# Patient Record
Sex: Male | Born: 1960 | Race: White | Hispanic: No | Marital: Married | Smoking: Never smoker
Health system: Southern US, Community
[De-identification: ages and names within clinical notes are randomized; demographics above are authoritative.]

## PROBLEM LIST (undated history)

## (undated) DIAGNOSIS — E039 Hypothyroidism, unspecified: Secondary | ICD-10-CM

## (undated) DIAGNOSIS — Z9889 Other specified postprocedural states: Secondary | ICD-10-CM

## (undated) DIAGNOSIS — S09309A Unspecified injury of unspecified middle and inner ear, initial encounter: Secondary | ICD-10-CM

## (undated) DIAGNOSIS — M7989 Other specified soft tissue disorders: Secondary | ICD-10-CM

## (undated) DIAGNOSIS — I10 Essential (primary) hypertension: Secondary | ICD-10-CM

## (undated) DIAGNOSIS — E669 Obesity, unspecified: Secondary | ICD-10-CM

## (undated) DIAGNOSIS — C801 Malignant (primary) neoplasm, unspecified: Secondary | ICD-10-CM

## (undated) DIAGNOSIS — Z862 Personal history of diseases of the blood and blood-forming organs and certain disorders involving the immune mechanism: Secondary | ICD-10-CM

## (undated) DIAGNOSIS — H8110 Benign paroxysmal vertigo, unspecified ear: Secondary | ICD-10-CM

## (undated) DIAGNOSIS — F329 Major depressive disorder, single episode, unspecified: Secondary | ICD-10-CM

## (undated) DIAGNOSIS — F32A Depression, unspecified: Secondary | ICD-10-CM

## (undated) DIAGNOSIS — M1990 Unspecified osteoarthritis, unspecified site: Secondary | ICD-10-CM

## (undated) DIAGNOSIS — R490 Dysphonia: Secondary | ICD-10-CM

## (undated) DIAGNOSIS — K760 Fatty (change of) liver, not elsewhere classified: Secondary | ICD-10-CM

## (undated) DIAGNOSIS — F1990 Other psychoactive substance use, unspecified, uncomplicated: Secondary | ICD-10-CM

## (undated) DIAGNOSIS — H9391 Unspecified disorder of right ear: Secondary | ICD-10-CM

## (undated) DIAGNOSIS — R112 Nausea with vomiting, unspecified: Secondary | ICD-10-CM

## (undated) DIAGNOSIS — Z8709 Personal history of other diseases of the respiratory system: Secondary | ICD-10-CM

## (undated) DIAGNOSIS — R5382 Chronic fatigue, unspecified: Secondary | ICD-10-CM

## (undated) DIAGNOSIS — M2550 Pain in unspecified joint: Secondary | ICD-10-CM

## (undated) DIAGNOSIS — M549 Dorsalgia, unspecified: Secondary | ICD-10-CM

## (undated) DIAGNOSIS — F419 Anxiety disorder, unspecified: Secondary | ICD-10-CM

## (undated) DIAGNOSIS — F1010 Alcohol abuse, uncomplicated: Secondary | ICD-10-CM

## (undated) DIAGNOSIS — J381 Polyp of vocal cord and larynx: Secondary | ICD-10-CM

## (undated) DIAGNOSIS — K219 Gastro-esophageal reflux disease without esophagitis: Secondary | ICD-10-CM

## (undated) DIAGNOSIS — B1920 Unspecified viral hepatitis C without hepatic coma: Secondary | ICD-10-CM

## (undated) DIAGNOSIS — F4310 Post-traumatic stress disorder, unspecified: Secondary | ICD-10-CM

## (undated) DIAGNOSIS — R51 Headache: Secondary | ICD-10-CM

## (undated) DIAGNOSIS — R1310 Dysphagia, unspecified: Secondary | ICD-10-CM

## (undated) DIAGNOSIS — G4733 Obstructive sleep apnea (adult) (pediatric): Secondary | ICD-10-CM

## (undated) DIAGNOSIS — J349 Unspecified disorder of nose and nasal sinuses: Secondary | ICD-10-CM

## (undated) DIAGNOSIS — R519 Headache, unspecified: Secondary | ICD-10-CM

## (undated) DIAGNOSIS — G4730 Sleep apnea, unspecified: Secondary | ICD-10-CM

## (undated) DIAGNOSIS — E785 Hyperlipidemia, unspecified: Secondary | ICD-10-CM

## (undated) HISTORY — DX: Unspecified disorder of nose and nasal sinuses: J34.9

## (undated) HISTORY — DX: Hypothyroidism, unspecified: E03.9

## (undated) HISTORY — DX: Chronic fatigue, unspecified: R53.82

## (undated) HISTORY — DX: Dorsalgia, unspecified: M54.9

## (undated) HISTORY — DX: Obstructive sleep apnea (adult) (pediatric): G47.33

## (undated) HISTORY — DX: Benign paroxysmal vertigo, unspecified ear: H81.10

## (undated) HISTORY — DX: Essential (primary) hypertension: I10

## (undated) HISTORY — PX: THROAT SURGERY: SHX803

## (undated) HISTORY — DX: Unspecified osteoarthritis, unspecified site: M19.90

## (undated) HISTORY — DX: Other specified soft tissue disorders: M79.89

## (undated) HISTORY — DX: Pain in unspecified joint: M25.50

## (undated) HISTORY — PX: OTHER SURGICAL HISTORY: SHX169

## (undated) HISTORY — DX: Fatty (change of) liver, not elsewhere classified: K76.0

## (undated) HISTORY — DX: Gastro-esophageal reflux disease without esophagitis: K21.9

## (undated) HISTORY — PX: NASAL FRACTURE SURGERY: SHX718

## (undated) HISTORY — DX: Unspecified disorder of right ear: H93.91

## (undated) HISTORY — DX: Other psychoactive substance use, unspecified, uncomplicated: F19.90

## (undated) HISTORY — DX: Dysphagia, unspecified: R13.10

## (undated) HISTORY — DX: Alcohol abuse, uncomplicated: F10.10

## (undated) HISTORY — DX: Unspecified viral hepatitis C without hepatic coma: B19.20

## (undated) HISTORY — DX: Sleep apnea, unspecified: G47.30

## (undated) HISTORY — DX: Hyperlipidemia, unspecified: E78.5

---

## 1992-12-11 DIAGNOSIS — B1920 Unspecified viral hepatitis C without hepatic coma: Secondary | ICD-10-CM

## 1992-12-11 HISTORY — DX: Unspecified viral hepatitis C without hepatic coma: B19.20

## 2001-10-26 ENCOUNTER — Emergency Department (HOSPITAL_COMMUNITY): Admission: EM | Admit: 2001-10-26 | Discharge: 2001-10-27 | Payer: Self-pay | Admitting: Emergency Medicine

## 2001-10-27 ENCOUNTER — Encounter: Payer: Self-pay | Admitting: Emergency Medicine

## 2004-06-13 ENCOUNTER — Emergency Department (HOSPITAL_COMMUNITY): Admission: EM | Admit: 2004-06-13 | Discharge: 2004-06-13 | Payer: Self-pay | Admitting: Emergency Medicine

## 2005-11-08 HISTORY — PX: TRANSTHORACIC ECHOCARDIOGRAM: SHX275

## 2008-12-11 HISTORY — PX: UPPER GASTROINTESTINAL ENDOSCOPY: SHX188

## 2009-03-12 ENCOUNTER — Encounter: Admission: RE | Admit: 2009-03-12 | Discharge: 2009-03-12 | Payer: Self-pay | Admitting: Family Medicine

## 2009-04-09 ENCOUNTER — Ambulatory Visit: Payer: Self-pay | Admitting: Pulmonary Disease

## 2009-04-09 ENCOUNTER — Ambulatory Visit: Payer: Self-pay | Admitting: Internal Medicine

## 2009-04-09 DIAGNOSIS — R0602 Shortness of breath: Secondary | ICD-10-CM | POA: Insufficient documentation

## 2009-04-09 DIAGNOSIS — G4730 Sleep apnea, unspecified: Secondary | ICD-10-CM | POA: Insufficient documentation

## 2009-04-09 DIAGNOSIS — M7710 Lateral epicondylitis, unspecified elbow: Secondary | ICD-10-CM | POA: Insufficient documentation

## 2009-04-09 DIAGNOSIS — E039 Hypothyroidism, unspecified: Secondary | ICD-10-CM | POA: Insufficient documentation

## 2009-04-09 DIAGNOSIS — E785 Hyperlipidemia, unspecified: Secondary | ICD-10-CM | POA: Insufficient documentation

## 2009-04-09 DIAGNOSIS — K219 Gastro-esophageal reflux disease without esophagitis: Secondary | ICD-10-CM | POA: Insufficient documentation

## 2009-04-09 DIAGNOSIS — I10 Essential (primary) hypertension: Secondary | ICD-10-CM | POA: Insufficient documentation

## 2009-04-12 ENCOUNTER — Encounter: Payer: Self-pay | Admitting: Internal Medicine

## 2009-04-22 ENCOUNTER — Telehealth (INDEPENDENT_AMBULATORY_CARE_PROVIDER_SITE_OTHER): Payer: Self-pay | Admitting: *Deleted

## 2009-05-07 ENCOUNTER — Ambulatory Visit: Payer: Self-pay | Admitting: Internal Medicine

## 2009-05-08 ENCOUNTER — Encounter (INDEPENDENT_AMBULATORY_CARE_PROVIDER_SITE_OTHER): Payer: Self-pay | Admitting: *Deleted

## 2009-05-08 LAB — CONVERTED CEMR LAB
Basophils Absolute: 0 10*3/uL (ref 0.0–0.1)
Basophils Relative: 0.1 % (ref 0.0–3.0)
Eosinophils Absolute: 0.1 10*3/uL (ref 0.0–0.7)
Eosinophils Relative: 1.3 % (ref 0.0–5.0)
H Pylori IgG: NEGATIVE
HCT: 41.3 % (ref 39.0–52.0)
Hemoglobin: 14.5 g/dL (ref 13.0–17.0)
Lymphocytes Relative: 23.9 % (ref 12.0–46.0)
Lymphs Abs: 1.6 10*3/uL (ref 0.7–4.0)
MCHC: 35.2 g/dL (ref 30.0–36.0)
MCV: 88.2 fL (ref 78.0–100.0)
Monocytes Absolute: 0.6 10*3/uL (ref 0.1–1.0)
Monocytes Relative: 8.6 % (ref 3.0–12.0)
Neutro Abs: 4.5 10*3/uL (ref 1.4–7.7)
Neutrophils Relative %: 66.1 % (ref 43.0–77.0)
Platelets: 213 10*3/uL (ref 150.0–400.0)
RBC: 4.67 M/uL (ref 4.22–5.81)
RDW: 12.4 % (ref 11.5–14.6)
WBC: 6.8 10*3/uL (ref 4.5–10.5)

## 2009-05-19 ENCOUNTER — Telehealth (INDEPENDENT_AMBULATORY_CARE_PROVIDER_SITE_OTHER): Payer: Self-pay | Admitting: *Deleted

## 2009-05-31 ENCOUNTER — Ambulatory Visit: Payer: Self-pay | Admitting: Internal Medicine

## 2009-05-31 DIAGNOSIS — R498 Other voice and resonance disorders: Secondary | ICD-10-CM | POA: Insufficient documentation

## 2009-05-31 HISTORY — DX: Other voice and resonance disorders: R49.8

## 2009-06-01 ENCOUNTER — Encounter: Payer: Self-pay | Admitting: Internal Medicine

## 2009-06-01 ENCOUNTER — Telehealth (INDEPENDENT_AMBULATORY_CARE_PROVIDER_SITE_OTHER): Payer: Self-pay

## 2009-06-01 ENCOUNTER — Ambulatory Visit: Payer: Self-pay | Admitting: Internal Medicine

## 2009-06-06 ENCOUNTER — Encounter: Payer: Self-pay | Admitting: Internal Medicine

## 2009-08-11 HISTORY — PX: LARYNGOSCOPY: SUR817

## 2009-09-02 ENCOUNTER — Encounter: Payer: Self-pay | Admitting: Internal Medicine

## 2009-09-08 ENCOUNTER — Telehealth (INDEPENDENT_AMBULATORY_CARE_PROVIDER_SITE_OTHER): Payer: Self-pay | Admitting: *Deleted

## 2009-10-07 ENCOUNTER — Telehealth (INDEPENDENT_AMBULATORY_CARE_PROVIDER_SITE_OTHER): Payer: Self-pay | Admitting: *Deleted

## 2009-10-14 ENCOUNTER — Telehealth (INDEPENDENT_AMBULATORY_CARE_PROVIDER_SITE_OTHER): Payer: Self-pay | Admitting: *Deleted

## 2010-01-03 ENCOUNTER — Ambulatory Visit: Payer: Self-pay | Admitting: Internal Medicine

## 2010-01-03 LAB — CONVERTED CEMR LAB
ALT: 34 units/L (ref 0–53)
AST: 23 units/L (ref 0–37)
Albumin: 3.9 g/dL (ref 3.5–5.2)
Alkaline Phosphatase: 62 units/L (ref 39–117)
BUN: 14 mg/dL (ref 6–23)
Basophils Absolute: 0 10*3/uL (ref 0.0–0.1)
Basophils Relative: 0.9 % (ref 0.0–3.0)
Bilirubin Urine: NEGATIVE
Bilirubin, Direct: 0.1 mg/dL (ref 0.0–0.3)
Blood in Urine, dipstick: NEGATIVE
CO2: 29 meq/L (ref 19–32)
Calcium: 8.8 mg/dL (ref 8.4–10.5)
Chloride: 104 meq/L (ref 96–112)
Cholesterol: 141 mg/dL (ref 0–200)
Creatinine, Ser: 0.9 mg/dL (ref 0.4–1.5)
Eosinophils Absolute: 0 10*3/uL (ref 0.0–0.7)
Eosinophils Relative: 0.8 % (ref 0.0–5.0)
GFR calc non Af Amer: 95.68 mL/min (ref >60)
Glucose, Bld: 96 mg/dL (ref 70–99)
Glucose, Urine, Semiquant: NEGATIVE
HCT: 41.7 % (ref 39.0–52.0)
HDL: 43.3 mg/dL (ref >39.00)
Hemoglobin: 13.9 g/dL (ref 13.0–17.0)
Hgb A1c MFr Bld: 5.3 % (ref 4.6–6.5)
Ketones, urine, test strip: NEGATIVE
LDL Cholesterol: 80 mg/dL (ref 0–99)
Lymphocytes Relative: 21.9 % (ref 12.0–46.0)
Lymphs Abs: 1.2 10*3/uL (ref 0.7–4.0)
MCHC: 33.2 g/dL (ref 30.0–36.0)
MCV: 91 fL (ref 78.0–100.0)
Monocytes Absolute: 0.4 10*3/uL (ref 0.1–1.0)
Monocytes Relative: 7.8 % (ref 3.0–12.0)
Neutro Abs: 3.9 10*3/uL (ref 1.4–7.7)
Neutrophils Relative %: 68.6 % (ref 43.0–77.0)
Nitrite: NEGATIVE
PSA: 0.37 ng/mL (ref 0.10–4.00)
Platelets: 193 10*3/uL (ref 150.0–400.0)
Potassium: 4.9 meq/L (ref 3.5–5.1)
Protein, U semiquant: NEGATIVE
RBC: 4.58 M/uL (ref 4.22–5.81)
RDW: 13 % (ref 11.5–14.6)
Sodium: 139 meq/L (ref 135–145)
Specific Gravity, Urine: <1.005
TSH: 3.32 microintl units/mL (ref 0.35–5.50)
Total Bilirubin: 0.7 mg/dL (ref 0.3–1.2)
Total CHOL/HDL Ratio: 3
Total Protein: 6.6 g/dL (ref 6.0–8.3)
Triglycerides: 87 mg/dL (ref 0.0–149.0)
Urobilinogen, UA: 0.2
VLDL: 17.4 mg/dL (ref 0.0–40.0)
Vit D, 25-Hydroxy: 37 ng/mL (ref 30–89)
WBC Urine, dipstick: NEGATIVE
WBC: 5.5 10*3/uL (ref 4.5–10.5)
pH: 6.5

## 2010-01-06 ENCOUNTER — Ambulatory Visit: Payer: Self-pay | Admitting: Internal Medicine

## 2010-01-06 DIAGNOSIS — R9431 Abnormal electrocardiogram [ECG] [EKG]: Secondary | ICD-10-CM | POA: Insufficient documentation

## 2010-01-06 DIAGNOSIS — R490 Dysphonia: Secondary | ICD-10-CM | POA: Insufficient documentation

## 2010-01-06 HISTORY — DX: Abnormal electrocardiogram (ECG) (EKG): R94.31

## 2010-01-25 ENCOUNTER — Encounter: Payer: Self-pay | Admitting: Internal Medicine

## 2010-01-26 ENCOUNTER — Encounter: Payer: Self-pay | Admitting: Internal Medicine

## 2010-02-09 ENCOUNTER — Encounter: Payer: Self-pay | Admitting: Internal Medicine

## 2010-02-09 HISTORY — PX: CARDIOVASCULAR STRESS TEST: SHX262

## 2010-02-16 ENCOUNTER — Encounter: Payer: Self-pay | Admitting: Internal Medicine

## 2010-08-02 ENCOUNTER — Ambulatory Visit: Payer: Self-pay | Admitting: Internal Medicine

## 2010-08-02 DIAGNOSIS — R5383 Other fatigue: Secondary | ICD-10-CM

## 2010-08-02 DIAGNOSIS — R5381 Other malaise: Secondary | ICD-10-CM | POA: Insufficient documentation

## 2010-08-03 LAB — CONVERTED CEMR LAB
BUN: 17 mg/dL (ref 6–23)
Basophils Absolute: 0 10*3/uL (ref 0.0–0.1)
Basophils Relative: 0.4 % (ref 0.0–3.0)
Creatinine, Ser: 0.7 mg/dL (ref 0.4–1.5)
Eosinophils Absolute: 0.1 10*3/uL (ref 0.0–0.7)
Eosinophils Relative: 1.3 % (ref 0.0–5.0)
HCT: 39.3 % (ref 39.0–52.0)
Hemoglobin: 13.5 g/dL (ref 13.0–17.0)
Lymphocytes Relative: 22.8 % (ref 12.0–46.0)
Lymphs Abs: 1.3 10*3/uL (ref 0.7–4.0)
MCHC: 34.3 g/dL (ref 30.0–36.0)
MCV: 89.2 fL (ref 78.0–100.0)
Monocytes Absolute: 0.4 10*3/uL (ref 0.1–1.0)
Monocytes Relative: 7.5 % (ref 3.0–12.0)
Neutro Abs: 3.8 10*3/uL (ref 1.4–7.7)
Neutrophils Relative %: 68 % (ref 43.0–77.0)
Platelets: 212 10*3/uL (ref 150.0–400.0)
Potassium: 4.5 meq/L (ref 3.5–5.1)
RBC: 4.41 M/uL (ref 4.22–5.81)
RDW: 13.8 % (ref 11.5–14.6)
TSH: 1.45 microintl units/mL (ref 0.35–5.50)
WBC: 5.6 10*3/uL (ref 4.5–10.5)

## 2010-10-25 ENCOUNTER — Ambulatory Visit: Payer: Self-pay | Admitting: Internal Medicine

## 2010-10-25 DIAGNOSIS — R519 Headache, unspecified: Secondary | ICD-10-CM | POA: Insufficient documentation

## 2010-10-25 DIAGNOSIS — R51 Headache: Secondary | ICD-10-CM | POA: Insufficient documentation

## 2010-10-25 DIAGNOSIS — H8110 Benign paroxysmal vertigo, unspecified ear: Secondary | ICD-10-CM | POA: Insufficient documentation

## 2010-10-26 ENCOUNTER — Encounter: Payer: Self-pay | Admitting: Internal Medicine

## 2011-01-09 ENCOUNTER — Telehealth (INDEPENDENT_AMBULATORY_CARE_PROVIDER_SITE_OTHER): Payer: Self-pay | Admitting: *Deleted

## 2011-01-10 NOTE — Consult Note (Signed)
Summary: [facility]  [facility]   Imported By: Lanelle Bal 02/17/2010 11:28:18  _____________________________________________________________________  External Attachment:    Type:   Image     Comment:   External Document

## 2011-01-10 NOTE — Assessment & Plan Note (Signed)
Summary: lost voice/bp med/kn   Vital Signs:  Patient profile:   50 year old male Weight:      284.0 pounds BMI:     39.47 Temp:     98.7 degrees F oral Pulse rate:   64 / minute Pulse (ortho):   79 / minute Resp:     15 per minute BP supine:   136 / 98 BP sitting:   130 / 84  (left arm) BP standing:   128 / 90 Cuff size:   large  Vitals Entered By: Shonna Chock CMA (October 25, 2010 8:04 AM)  Serial Vital Signs/Assessments:  Time      Position  BP       Pulse  Resp  Temp     By           Lying LA  136/98   79                    Chrae Malloy CMA           Sitting   130/92   74                    Chrae Malloy CMA           Standing  128/90   79                    Chrae Malloy CMA  CC: 1.) Lost voice again-seen specialist in the past, d/c'ed Losartan   2.) Headaches everyday x 2 months (? sinuses) , URI symptoms   Primary Care Kyley Solow:  Marga Melnick, MD  CC:  1.) Lost voice again-seen specialist in the past, d/c'ed Losartan   2.) Headaches everyday x 2 months (? sinuses) , and URI symptoms.  History of Present Illness: Headaches      This is a 50 year old man who presents with Headaches for 2 months.  The patient reports nasal congestion and sinus pressure, but denies nausea, vomiting, sweats, tearing of eyes, photophobia, and phonophobia.  The headache is described as constant and dull, but it is also intermittently throbbingassocia.  The location of the pain is  over anterior crown bilaterally.No  associated  vision loss  but vision blurred. The pain worse with exertion.  The patient denies the following high-risk features: fever, neck pain/stiffness, focal weakness, altered mental status, and rash.  The headaches  have no triggers. Prior treatment has included a NSAID & rest . Turning over in bed causes dizziness ; he describes similar symptoms standing up. No frank vertigo noted. He D/Ced Losartan in 09/11 as adverse effects included dizziness. Dizziness has continued off the  ARB. PMH of ruptured TM diving in [employer]	[place] [phone]. URI Symptoms      The patient also presents with URI symptoms last week for 2 days with green secretions .This resolved w/o Rx .His hoarseness also returned last week . He is scheduled to see Dr Viann Shove 10/26/2010 @ [facility].  The patient now  denies nasal congestion, purulent nasal discharge, sore throat, productive cough, and earache.  The patient denies fever, dyspnea, and wheezing.  The patient denies the following risk factors for Strep sinusitis: unilateral facial pain, tooth pain, and tender adenopathy.    Current Medications (verified): 1)  Prilosec 20 Mg Cpdr (Omeprazole) .[name].. 1 By Mouth Once Daily 2)  Vytorin 10-40 Mg Tabs (Ezetimibe-Simvastatin) .... Once Daily 3)  Thyroxine .... [name]175 Mg Tab Once Daily 4)  Vitamin C .... Once Daily 5)  Mens Multi Vitamin .... Once Daily 6)  Tylenol Ex St Arthritis Pain 500 Mg Tabs (Acetaminophen) .... As Needed 7)  Amlodipine Besylate 5 Mg Tabs (Amlodipine Besylate) .[name].. 1 Once Daily If Bp Averages > 135/85 On Average  Allergies: 1)  ! Benazepril Hcl (Benazepril Hcl)  Physical Exam  General:  well-nourished,in no acute distress; alert,appropriate and cooperative throughout examination Head:  Normocephalic and atraumatic without obvious abnormalities.  pattern  alopecia  Eyes:  No corneal or conjunctival inflammation noted. EOMI. Perrla. Field of  Vision grossly normal. Slight ptosis OD Ears:  External ear exam shows no significant lesions or deformities.  Otoscopic examination reveals clear canals, tympanic membranes are intact bilaterally without bulging, retraction, inflammation or discharge. Hearing is grossly normal bilaterally. Nose:  External nasal examination shows no deformity or inflammation. Nasal mucosa are pink and moist without lesions or exudates. Septum to L Mouth:  Oral mucosa and oropharynx without lesions or exudates.  Teeth in good repair. Orophrayngeal crowding. Hoarse Neck:   No deformities, masses, or tenderness noted. Lungs:  Normal respiratory effort, chest expands symmetrically. Lungs are clear to auscultation, no crackles or wheezes. Heart:  Normal rate and regular rhythm. S1 and S2 normal without gallop, murmur, click, rub or other extra sounds. Pulses:  R and L carotid pulses are full and equal bilaterally w/o bruits Extremities:  No clubbing, cyanosis, edema. Neurologic:  alert & oriented X3, cranial nerves II-XII intact, strength normal in all extremities, sensation intact to light touch, gait normal, DTRs symmetrical and normal, finger-to-nose normal, and Romberg negative.   Skin:  Intact without suspicious lesions or rashes Cervical Nodes:  No lymphadenopathy noted Axillary Nodes:  No palpable lymphadenopathy Psych:  memory intact for recent and remote, normally interactive, and good eye contact.     Impression & Recommendations:  Problem # 1:  HEADACHE (ICD-784.0)  His updated medication list for this problem includes:    Tylenol Ex St Arthritis Pain 500 Mg Tabs (Acetaminophen) .[name]... As needed    Tramadol Hcl 50 Mg Tabs (Tramadol hcl) .[name]... 1/2 -1 every 6 hrs as needed for headache  Problem # 2:  BENIGN POSITIONAL VERTIGO (ICD-386.11) No significant postural BP drop Orders: Physical Therapy Referral (PT)  Problem # 3:  DYSPHONIA, CHRONIC (ICD-784.42)  Complete Medication List: 1)  Prilosec 20 Mg Cpdr (Omeprazole) .[name].. 1 by mouth once daily 2)  Vytorin 10-40 Mg Tabs (Ezetimibe-simvastatin) .... Once daily 3)  Thyroxine  .... [name]175 mg tab once daily 4)  Vitamin C  .... Once daily 5)  Mens Multi Vitamin  .... Once daily 6)  Tylenol Ex St Arthritis Pain 500 Mg Tabs (Acetaminophen) .... As needed 7)  Amlodipine Besylate 5 Mg Tabs (Amlodipine besylate) .[name].. 1 once daily if bp averages > 135/85 on average 8)  Tramadol Hcl 50 Mg Tabs (Tramadol hcl) .... 1/2 -1 every 6 hrs as needed for headache  Patient Instructions: 1)  Keep Headache Diary as  discussed.  2)  Check your Blood Pressure regularly. If it is above: 135/85 ON AVERAGE you should  restart the Losartan. Prescriptions: TRAMADOL HCL 50 MG TABS (TRAMADOL HCL) 1/2 -1 every 6 hrs as needed for headache  #30 x 0   Entered and Authorized by:   Marga Melnick MD   Signed by:   Marga Melnick MD on 10/25/2010   Method used:   Print then Give to Patient   RxID:   [id number]    Orders Added: 1)  Est. Patient Level IV [16109] 2)  Physical Therapy Referral [PT]

## 2011-01-10 NOTE — Op Note (Signed)
Summary: Transnasal Flex Laryngoscopy/[facility]  Transnasal Flex Laryngoscopy/[facility]   Imported By: Lanelle Bal 11/07/2010 13:41:32  _____________________________________________________________________  External Attachment:    Type:   Image     Comment:   External Document

## 2011-01-10 NOTE — Letter (Signed)
Summary: [facility]  [facility]   Imported By: Lanelle Bal 03/12/2010 11:51:34  _____________________________________________________________________  External Attachment:    Type:   Image     Comment:   External Document

## 2011-01-10 NOTE — Assessment & Plan Note (Signed)
Summary: cpx//ccm   Vital Signs:  Patient profile:   50 year old male Height:      71.25 inches Weight:      288 pounds BMI:     40.03 Temp:     98.3 degrees F oral Resp:     14 per minute BP sitting:   122 / 86  (left arm) Cuff size:   large  Vitals Entered By: Shonna Chock (January 06, 2010 11:31 AM)  Comments REVIEWED MED LIST, PATIENT AGREED DOSE AND INSTRUCTION CORRECT    Primary Care Provider:  Marga Melnick, MD   History of Present Illness: Samuel Goodwin is here for a physical; he is concerned about chronic hoarseness & inability to lose weight despite exercise. Appt with Dr Allyson Sabal 01/2010 ( see EKG: NS ST-T changes)   Allergies: 1)  ! Benazepril Hcl (Benazepril Hcl)  Past History:  Past Medical History: Hepatitis C  +, S/P Interferon X 12 months , neg serology 2002, Dr Kinnie Scales Hyperlipidemia Hypertension Hypothyroidism GERD  Alcoholism, Sobriety since 1990 Sleep apnea 2007; Dysphonia 784.42; [place] during [employer]  Past Surgical History: Oral surgery; Endoscopy 2010  negative, Dr Leone Payor; Flex FO Laryngoscopy 08/2009 , Dr Jenne Pane  Family History: Father:?Tb, cirrhosis,DM; Mother: CHF,CAD,HTN; Siblings: bro MI @ 54;bro DUD, gastric bypass; M uncles & aunts CAD/MI; M aunt AAA  Social History: 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 Eyes:  Denies blurring, double vision, and vision loss-both eyes. ENT:  Complains of decreased hearing and ringing in ears; denies nasal congestion, sinus pressure, and sore throat; Intermittent muffled hearing & tinnitus. Occasional dysphagia even w/o foods or pills. CV:  Complains of shortness of breath with exertion; denies bluish discoloration of lips or  nails, chest pain or discomfort, difficulty breathing at night, difficulty breathing while lying down, leg cramps with exertion, palpitations, swelling of feet, and swelling of hands. Resp:  Complains of excessive snoring; denies cough, hypersomnolence, morning headaches, shortness of breath, sputum productive, and wheezing. GI:  Denies abdominal pain, bloody stools, dark tarry stools, and indigestion; On PPI two times a day & bland diet. GU:  Denies discharge, dysuria, hematuria, and incontinence. MS:  Complains of joint pain, low back pain, and muscle aches; denies joint redness, joint swelling, mid back pain, and thoracic pain; Nocturnal calf cramps better with banana or juice. Derm:  Denies changes in nail beds, dryness, and lesion(s). Neuro:  Denies numbness and tingling. Psych:  Denies anxiety and depression. Endo:  Denies cold intolerance, excessive hunger, excessive thirst, excessive urination, and heat intolerance. Heme:  Denies abnormal bruising and bleeding. Allergy:  Denies itching eyes and sneezing; No angioedema.  Physical Exam  General:  well-nourished,in no acute distress; alert,appropriate and cooperative throughout examination Head:  Normocephalic and atraumatic without obvious abnormalities. Pattern  alopecia  Eyes:  No corneal or conjunctival inflammation noted. Perrla. Funduscopic exam benign, without hemorrhages, exudates or papilledema.  Ears:  External ear exam shows no significant lesions or deformities.  Otoscopic examination reveals clear canals, tympanic membranes are intact bilaterally without bulging, retraction, inflammation or discharge. Hearing is grossly normal bilaterally. Nose:  External nasal examination shows no deformity or inflammation. Nasal mucosa are pink and moist without lesions or exudates. Mouth:  Oral mucosa and oropharynx without lesions or exudates.  Teeth in good repair. Crowded oropharynx; marked hoarseness Neck:  No deformities, masses, or  tenderness noted. Lungs:  Normal respiratory effort, chest expands symmetrically. Lungs are clear to auscultation, no crackles or wheezes. Heart:  Normal rate and regular rhythm. S1 and S2 normal without gallop, murmur, click, rub.S4 Abdomen:  Bowel sounds positive,abdomen soft and non-tender without masses, organomegaly or hernias noted. Rectal:  No external abnormalities noted. Normal sphincter tone. No rectal masses or tenderness. Genitalia:  Testes bilaterally descended without nodularity, tenderness or masses. No scrotal masses or lesions. No penis lesions or urethral discharge. Prostate:  Prostate gland firm and smooth, no enlargement, nodularity, tenderness, mass, asymmetry or induration. Msk:  No deformity or scoliosis noted of thoracic or lumbar spine.   Pulses:  R and L carotid,radial,dorsalis pedis and posterior tibial pulses are full and equal bilaterally Extremities:  No clubbing, cyanosis, edema, or deformity noted with normal full range of motion of all joints.   mild crepitus of knees Neurologic:  alert & oriented X3 and DTRs symmetrical and normal.   Skin:  Intact without suspicious lesions or rashes Cervical Nodes:  No lymphadenopathy noted Axillary Nodes:  No palpable lymphadenopathy Inguinal Nodes:  No significant adenopathy Psych:  memory intact for recent and remote, normally interactive, and good eye contact.     Impression & Recommendations:  Problem # 1:  ROUTINE GENERAL MEDICAL EXAM@HEALTH  CARE FACL (ICD-V70.0)  Orders: EKG w/ Interpretation (93000)  Problem # 2:  DYSPHONIA, CHRONIC (UVO-536.64)  Orders: ENT Referral (ENT)  Problem # 3:  GERD (ICD-530.81)  His updated medication list for this problem includes:    Eql Omeprazole 20 Mg Tbec (Omeprazole) .[name]... 1 two times a day pre meals x 8 weeks  Problem # 4:  HYPOTHYROIDISM (ICD-244.9)  Problem # 5:  SLEEP APNEA (ICD-780.57)  Problem # 6:  HYPERTENSION (ICD-401.9)  His updated medication list for  this problem includes:    Cozaar 100 Mg Tabs (Losartan potassium) .[name]... 1 once daily  Orders: EKG w/ Interpretation (93000)  Complete Medication List: 1)  Eql Omeprazole 20 Mg Tbec (Omeprazole) .[name].. 1 two times a day pre meals x 8 weeks 2)  Vytorin 10-40 Mg Tabs (Ezetimibe-simvastatin) .... Once daily 3)  Flobic  .... Once daily 4)  Thyroxine  .... [name]175 mg tab once daily 5)  Vitamin C  .... Once daily 6)  Mens Multi Vitamin  .... Once daily 7)  Cozaar 100 Mg Tabs (Losartan potassium) .[name].. 1 once daily 8)  Glucosamine Hcl 1000 Mg Tabs (Glucosamine hcl) .[name].. 1 by mouth once daily 9)  Tylenol Ex St Arthritis Pain 500 Mg Tabs (Acetaminophen) .... As needed  Patient Instructions: 1)  Consume LESS THAN 40 grams of sugar/ day from LABLED foods & drinks with High Fructose Corn Syrup as #1,2 or 33 on label. Goal = waist < 40 inches. Vitamin D3 1000 International Units once daily  Prescriptions: COZAAR 100 MG TABS (LOSARTAN POTASSIUM) 1 once daily  #90 x 3   Entered and Authorized by:   Marga Melnick MD   Signed by:   Marga Melnick MD on 01/06/2010   Method used:   Faxed to ...       [facility]* (retail)       [address] [place], [place]  [address]       Ph: [phone] or [phone]  Fax: [phone]   RxID:   [id number] THYROXINE .175 mg tab once daily  #90 x 3   Entered and Authorized by:   Marga Melnick MD   Signed by:   Marga Melnick MD on 01/06/2010   Method used:   Faxed to ...       [facility]* (retail)       [address] [place], [place]  [address]       Ph: [phone] or [phone]       Fax: [phone]   RxID:   [id number] VYTORIN 10-40 MG TABS (EZETIMIBE-SIMVASTATIN) once daily  #90 x 3   Entered and Authorized by:   Marga Melnick MD   Signed by:   Marga Melnick MD on 01/06/2010   Method used:   Faxed to ...        [facility]* (retail)       [address] [place], [place]  [address]       Ph: [phone] or [phone]       Fax: [phone]   RxID:   [id number]   Appended Document: cpx//ccm Call cell [phone] for ENT appt

## 2011-01-10 NOTE — Assessment & Plan Note (Signed)
Summary: VOICE IRRITATED/CBS   Vital Signs:  Patient profile:   50 year old male Weight:      287 pounds Temp:     98.4 degrees F oral Pulse rate:   72 / minute Resp:     17 per minute BP sitting:   134 / 90  (left arm) Cuff size:   large  Vitals Entered By: Shonna Chock CMA (August 02, 2010 8:08 AM) CC: 1.) Refill Omeprazole- acid reflux causing voice irritation, Fatigue   Primary Care Provider:  Marga Melnick, MD  CC:  1.) Refill Omeprazole- acid reflux causing voice irritation and Fatigue.  History of Present Illness: Fatigue      This is a 50 year old man who presents with Fatigue for several weeks.  The patient reports primarily motivational fatigue; "a lot going on". Fatigue  is intermittent not  persistent fatigue and fatigue is present even w/o without exercise.  The patient also reports  occasional night sweats.  The patient denies fever, weight loss(weight up 10# over recent few weeks), exertional chest pain, dyspnea, cough, and hemoptysis.  Other symptoms include severe snoring;  PMH of apnea as per Dr Allyson Sabal  but CPAP never received due to cost & possible impact on driving privileges.  The patient denies the following symptoms: leg swelling, orthopnea, PND, melena, adenopathy, daytime sleepiness, and skin changes.  He questions  feeling depressed.  The patient denies altered appetite and poor sleep . His sleep disturbed by [place] schedule in [place] , working up to 14-15 hrs/day. Hoarseness evaluated   by Dr Delford Field ,Pollyann Savoy & biopsy of VC lesion completed   02/16/2010(note reviewed). Hoarseness has returned since he ran out of his PPI several weeks ago. Prilosec OTC helped.  Current Medications (verified): 1)  Eql Omeprazole 20 Mg Tbec (Omeprazole) .[name].. 1 Two Times A Day Pre Meals X 8 Weeks 2)  Vytorin 10-40 Mg Tabs (Ezetimibe-Simvastatin) .... Once Daily 3)  Thyroxine .... [name]175 Mg Tab Once Daily 4)  Vitamin C .... Once Daily 5)  Mens Multi Vitamin .... Once Daily 6)  Cozaar 100 Mg  Tabs (Losartan Potassium) .[name].. 1 Once Daily 7)  Tylenol Ex St Arthritis Pain 500 Mg Tabs (Acetaminophen) .... As Needed  Allergies: 1)  ! Benazepril Hcl (Benazepril Hcl)  Review of Systems General:  Denies chills. Eyes:  Denies blurring, double vision, and vision loss-both eyes. ENT:  Denies difficulty swallowing. CV:  Denies palpitations. Resp:  Denies morning headaches. GI:  Denies constipation and diarrhea. Derm:  Denies changes in nail beds and dryness. Neuro:  Denies numbness and tingling. Psych:  Complains of anxiety, easily angered, easily tearful, and irritability; He does not feel significant depression is present; meds declined. Some family stresses present. Endo:  Complains of heat intolerance; denies cold intolerance.  Physical Exam  General:  in no acute distress; alert,appropriate and cooperative throughout examination Eyes:  No corneal or conjunctival inflammation noted. No lid lag.Perrla.No icterus Mouth:  Oral mucosa and oropharynx without lesions or exudates but oropharynx crowded.  Teeth in good repair.Very hoarse Neck:  No deformities, masses, or tenderness noted. Lungs:  Normal respiratory effort, chest expands symmetrically. Lungs are clear to auscultation, no crackles or wheezes. Heart:  normal rate, regular rhythm, no murmur, no gallop, no rub, no JVD, and no HJR.  S4 Abdomen:  Bowel sounds positive,abdomen soft and non-tender without masses, organomegaly or hernias noted. Pulses:  R and L carotid,radial,dorsalis pedis and posterior tibial pulses are full and equal bilaterally Extremities:  No clubbing, cyanosis, edema. Neurologic:  alert & oriented X3 and DTRs symmetrical and normal.   Skin:  Intact without suspicious lesions or rashes Cervical Nodes:  No lymphadenopathy noted Axillary Nodes:  No palpable lymphadenopathy Psych:  memory intact for recent and remote, normally interactive, and good eye contact.     Impression & Recommendations:  Problem #  1:  FATIGUE (ICD-780.79)  Multifactorial; R/O progressive Sleep Apnea  Orders: Venipuncture (16109) TLB-CBC Platelet - w/Differential (85025-CBCD) TLB-TSH (Thyroid Stimulating Hormone) (84443-TSH)  Problem # 2:  HOARSENESS, CHRONIC (ICD-784.49) Due to #3 & VC issues  Problem # 3:  GERD (ICD-530.81)  Orders: Venipuncture (60454) TLB-CBC Platelet - w/Differential (85025-CBCD)  Problem # 4:  SLEEP APNEA (ICD-780.57) Updated assessment needed; risk of uncontrolled Apnea discussed  Problem # 5:  HYPERTENSION (ICD-401.9)  His updated medication list for this problem includes:    Cozaar 100 Mg Tabs (Losartan potassium) .[name]... 1 once daily    Amlodipine Besylate 5 Mg Tabs (Amlodipine besylate) .[name]... 1 once daily if bp averages > 135/85 on average  Orders: Venipuncture (09811) TLB-Creatinine, Blood (82565-CREA) TLB-Potassium (K+) (84132-K) TLB-BUN (Urea Nitrogen) (84520-BUN)  Complete Medication List: 1)  Omeprazole 20 Mg  .[name].. 1 two times a day pre meals x 8 weeks then every am thereafter 2)  Vytorin 10-40 Mg Tabs (Ezetimibe-simvastatin) .... Once daily 3)  Thyroxine  .... [name]175 mg tab once daily 4)  Vitamin C  .... Once daily 5)  Mens Multi Vitamin  .... Once daily 6)  Cozaar 100 Mg Tabs (Losartan potassium) .[name].. 1 once daily 7)  Tylenol Ex St Arthritis Pain 500 Mg Tabs (Acetaminophen) .... As needed 8)  Amlodipine Besylate 5 Mg Tabs (Amlodipine besylate) .[name].. 1 once daily if bp averages > 135/85 on average  Patient Instructions: 1)  See Dr Allyson Sabal to assess status of Sleep Apnea. Consider possibility of Neurotransmitter Deficiency as discussed. 2)  Check your Blood Pressure regularly. If it is above:  135/85 ON AVERAGE you should add Amlodipine 5 mg once daily. 3)  Avoid foods high in acid (tomatoes, citrus juices, spicy foods). Avoid eating within two hours of lying down or before exercising. Do not over eat; try smaller more frequent meals. Elevate head of bed twelve inches when  sleeping. Prescriptions: AMLODIPINE BESYLATE 5 MG TABS (AMLODIPINE BESYLATE) 1 once daily if BP averages > 135/85 ON AVERAGE  #30 x 11   Entered and Authorized by:   Marga Melnick MD   Signed by:   Marga Melnick MD on 08/02/2010   Method used:   Print then Give to Patient   RxID:   [id number] OMEPRAZOLE 20 MG 1 two times a day pre meals X 8 weeks then every am thereafter  #60 x 5   Entered and Authorized by:   Marga Melnick MD   Signed by:   Marga Melnick MD on 08/02/2010   Method used:   Print then Give to Patient   RxID:   [id number]

## 2011-01-10 NOTE — Letter (Signed)
Summary: [facility]  [facility]   Imported By: Lanelle Bal 02/02/2010 09:12:23  _____________________________________________________________________  External Attachment:    Type:   Image     Comment:   External Document

## 2011-01-10 NOTE — Op Note (Signed)
Summary: Transnasal Flex Laryngoscopy/[facility]  Transnasal Flex Laryngoscopy/[facility]   Imported By: Lanelle Bal 03/01/2010 12:58:52  _____________________________________________________________________  External Attachment:    Type:   Image     Comment:   External Document

## 2011-01-18 NOTE — Progress Notes (Signed)
Summary: REFILL  Phone Note Refill Request Call back at [phone] Message from:  Pharmacy on January 09, 2011 9:24 AM  Refills Requested: Medication #1:  THYROXINE .175 mg tab once daily   Dosage confirmed as above?Dosage Confirmed   Supply Requested: 3 months  Medication #2:  VYTORIN 10-40 MG TABS once daily   Dosage confirmed as above?Dosage Confirmed   Supply Requested: 3 months [facility]  Next Appointment Scheduled: NONE Initial call taken by: Lavell Islam,  January 09, 2011 9:24 AM  Follow-up for Phone Call        Lipid/Hep 272.4/995.20 (labs due)  Follow-up by: Shonna Chock CMA,  January 09, 2011 3:39 PM    New/Updated Medications: VYTORIN 10-40 MG TABS (EZETIMIBE-SIMVASTATIN) once daily**LABS DUE** Prescriptions: THYROXINE .175 mg tab once daily  #90 x 2   Entered by:   Shonna Chock CMA   Authorized by:   Marga Melnick MD   Signed by:   Shonna Chock CMA on 01/09/2011   Method used:   Faxed to ...       [facility]* (retail)       [address] [place], [place]  [address]       Ph: [phone] or [phone]       Fax: [phone]   RxID:   [id number] VYTORIN 10-40 MG TABS (EZETIMIBE-SIMVASTATIN) once daily**LABS DUE**  #90 x 0   Entered by:   Shonna Chock CMA   Authorized by:   Marga Melnick MD   Signed by:   Shonna Chock CMA on 01/09/2011   Method used:   Electronically to        [facility]* (retail)       [address] [place], [place]  [address]       Ph: [phone] or [phone]       Fax: [phone]   RxID:   [id number]

## 2011-05-03 ENCOUNTER — Encounter: Payer: Self-pay | Admitting: Internal Medicine

## 2011-05-19 ENCOUNTER — Other Ambulatory Visit: Payer: Self-pay | Admitting: Internal Medicine

## 2011-07-15 ENCOUNTER — Other Ambulatory Visit: Payer: Self-pay | Admitting: Internal Medicine

## 2011-07-27 ENCOUNTER — Encounter: Payer: Self-pay | Admitting: Internal Medicine

## 2011-07-28 ENCOUNTER — Encounter: Payer: Self-pay | Admitting: Internal Medicine

## 2011-07-28 DIAGNOSIS — Z0289 Encounter for other administrative examinations: Secondary | ICD-10-CM

## 2011-08-22 ENCOUNTER — Other Ambulatory Visit: Payer: Self-pay | Admitting: Internal Medicine

## 2011-10-12 ENCOUNTER — Other Ambulatory Visit: Payer: Self-pay | Admitting: Internal Medicine

## 2011-10-12 NOTE — Telephone Encounter (Signed)
Patient canceled upcoming appt for 10/16/11 & no show on 07/28/11 - 30-day supply given.

## 2011-10-16 ENCOUNTER — Encounter: Payer: Self-pay | Admitting: Internal Medicine

## 2011-11-21 ENCOUNTER — Other Ambulatory Visit: Payer: Self-pay | Admitting: Internal Medicine

## 2011-11-21 NOTE — Telephone Encounter (Signed)
Appointment DUE

## 2011-12-13 ENCOUNTER — Other Ambulatory Visit: Payer: Self-pay | Admitting: Internal Medicine

## 2011-12-13 MED ORDER — EZETIMIBE-SIMVASTATIN 10-40 MG PO TABS: ORAL_TABLET | ORAL | Status: DC

## 2011-12-13 MED ORDER — LEVOTHYROXINE SODIUM 175 MCG PO TABS: ORAL_TABLET | ORAL | Status: DC

## 2011-12-13 NOTE — Telephone Encounter (Signed)
Lipid/Hep/TSH 244.9/995.20/272.4 due, then patient to f/u with MD 3-5 days later.

## 2011-12-23 ENCOUNTER — Other Ambulatory Visit: Payer: Self-pay | Admitting: Internal Medicine

## 2012-01-13 ENCOUNTER — Other Ambulatory Visit: Payer: Self-pay | Admitting: Internal Medicine

## 2012-01-15 ENCOUNTER — Other Ambulatory Visit: Payer: Self-pay | Admitting: Internal Medicine

## 2012-01-15 MED ORDER — OMEPRAZOLE 20 MG PO CPDR: DELAYED_RELEASE_CAPSULE | ORAL | Status: DC

## 2012-01-15 NOTE — Telephone Encounter (Signed)
Patient needs to schedule a CPX  

## 2012-01-15 NOTE — Telephone Encounter (Signed)
Patient needs to schedule a fasting appointment to see MD and have labs done

## 2012-01-26 ENCOUNTER — Telehealth: Payer: Self-pay | Admitting: Internal Medicine

## 2012-01-26 NOTE — Telephone Encounter (Signed)
Patient would like to know why he only received a 15 day supply of Vytorin.

## 2012-01-26 NOTE — Telephone Encounter (Signed)
Discuss with patient, CPX scheduled and Ok with Dr Alwyn Ren.

## 2012-02-01 ENCOUNTER — Encounter: Payer: Self-pay | Admitting: Internal Medicine

## 2012-02-01 ENCOUNTER — Ambulatory Visit (INDEPENDENT_AMBULATORY_CARE_PROVIDER_SITE_OTHER): Payer: Managed Care, Other (non HMO) | Admitting: Internal Medicine

## 2012-02-01 VITALS — BP 118/70 | HR 74 | Temp 98.3°F | Resp 14 | Ht 72.0 in | Wt 290.0 lb

## 2012-02-01 DIAGNOSIS — Z1211 Encounter for screening for malignant neoplasm of colon: Secondary | ICD-10-CM

## 2012-02-01 DIAGNOSIS — R9431 Abnormal electrocardiogram [ECG] [EKG]: Secondary | ICD-10-CM

## 2012-02-01 DIAGNOSIS — G4730 Sleep apnea, unspecified: Secondary | ICD-10-CM

## 2012-02-01 DIAGNOSIS — R498 Other voice and resonance disorders: Secondary | ICD-10-CM

## 2012-02-01 DIAGNOSIS — I10 Essential (primary) hypertension: Secondary | ICD-10-CM

## 2012-02-01 DIAGNOSIS — Z0000 Encounter for general adult medical examination without abnormal findings: Secondary | ICD-10-CM

## 2012-02-01 LAB — HEPATIC FUNCTION PANEL
Albumin: 4.1 g/dL (ref 3.5–5.2)
Bilirubin, Direct: 0 mg/dL (ref 0.0–0.3)
Total Protein: 7.2 g/dL (ref 6.0–8.3)

## 2012-02-01 LAB — BASIC METABOLIC PANEL
Chloride: 106 mEq/L (ref 96–112)
Creatinine, Ser: 0.8 mg/dL (ref 0.4–1.5)
Potassium: 3.9 mEq/L (ref 3.5–5.1)
Sodium: 143 mEq/L (ref 135–145)

## 2012-02-01 LAB — LIPID PANEL
HDL: 46.6 mg/dL (ref >39.00)
Triglycerides: 143 mg/dL (ref 0.0–149.0)

## 2012-02-01 LAB — CBC WITH DIFFERENTIAL/PLATELET
Basophils Relative: 0.4 % (ref 0.0–3.0)
Eosinophils Relative: 0.9 % (ref 0.0–5.0)
HCT: 41.1 % (ref 39.0–52.0)
Hemoglobin: 13.9 g/dL (ref 13.0–17.0)
MCV: 86.9 fl (ref 78.0–100.0)
Monocytes Absolute: 0.5 10*3/uL (ref 0.1–1.0)
Neutrophils Relative %: 65.7 % (ref 43.0–77.0)
RBC: 4.74 Mil/uL (ref 4.22–5.81)
WBC: 6.2 10*3/uL (ref 4.5–10.5)

## 2012-02-01 MED ORDER — OMEPRAZOLE 20 MG PO CPDR: DELAYED_RELEASE_CAPSULE | ORAL | Status: DC

## 2012-02-01 MED ORDER — HYDROCHLOROTHIAZIDE 25 MG PO TABS: 25.0000 mg | ORAL_TABLET | Freq: Every day | ORAL | Status: AC

## 2012-02-01 MED ORDER — LEVOTHYROXINE SODIUM 175 MCG PO TABS: 175.0000 ug | ORAL_TABLET | Freq: Every day | ORAL | Status: DC

## 2012-02-01 NOTE — Assessment & Plan Note (Signed)
Blood pressure is well controlled despite recent family stress. Labs will be checked

## 2012-02-01 NOTE — Patient Instructions (Signed)
Preventive Health Care: Exercise at least 30-45 minutes a day,  3-4 days a week.  Eat a low-fat diet with lots of fruits and vegetables, up to 7-9 servings per day. Avoid obesity; your goal is waist measurement < 40 inches.Consume less than 40 grams of sugar per day from foods & drinks with High Fructose Corn Sugar as # 1,2,3 or # 4 on label. Blood Pressure Goal  Ideally is an AVERAGE < 135/85. This AVERAGE should be calculated from @ least 5-7 BP readings taken @ different times of day on different days of week. You should not respond to isolated BP readings , but rather the AVERAGE for that week  

## 2012-02-01 NOTE — Progress Notes (Signed)
  Subjective:    Patient ID: Samuel Goodwin, male    DOB: 02-22-1961, 51 y.o.   MRN: [id number]  HPI Samuel Goodwin  is here for a physical; he denies acute issues.      Review of Systems HYPERTENSION: Disease Monitoring: Blood pressure range-< 135/85  Chest pain, palpitations- no       Dyspnea- no Medications: Compliance- yes  Lightheadedness,Syncope- no    Edema- no  FASTING HYPERGLYCEMIA, PMH of: Polyuria/phagia/dipsia- no       Visual problems- no  HYPERLIPIDEMIA: Disease Monitoring: See symptoms for Hypertension Diet: decreased portions, heart healthy Medications: Compliance- yes  Abd pain, bowel changes-no  Muscle aches- no  He just states that his hoarseness is worse if he misses his evening dose of PPI.        Objective:   Physical Exam Gen.:  well-nourished in appearance. Alert, appropriate and cooperative throughout exam. Head: Normocephalic without obvious abnormalities;  Pattern  alopecia  Eyes: No corneal or conjunctival inflammation noted. Pupils equal round reactive to light and accommodation. Fundal exam is benign without hemorrhages, exudate, papilledema. Extraocular motion intact. Vision grossly normal. Ears: External  ear exam reveals no significant lesions or deformities. Canals clear .TMs normal. Hearing is grossly normal bilaterally. Nose: External nasal exam reveals no deformity or inflammation. Nasal mucosa are pink and moist. No lesions or exudates noted. Septum deviated to R Mouth: Oral mucosa and oropharynx reveal no lesions or exudates. Teeth in good repair. Crowded , slightly erythematous pharynx. Hoarse Neck: No deformities, masses, or tenderness noted. Range of motion & Thyroid normal Lungs: Normal respiratory effort; chest expands symmetrically. Lungs are clear to auscultation without rales, wheezes, or increased work of breathing. Heart: Normal rate and rhythm. Normal S1 and S2. No gallop, click, or rub. S 4 w/o  murmur. Abdomen: Bowel sounds  normal; abdomen soft and nontender. No masses, organomegaly or hernias noted. Genitalia/ DRE: Genital exam is unremarkable. Prostate is normal without enlargement, induration, nodularity or asymmetry.                    Musculoskeletal/extremities: No deformity or scoliosis noted of  the thoracic or lumbar spine. No clubbing, cyanosis, edema, or deformity noted. Range of motion  normal .Tone & strength  normal.Joints normal. Nail health  good. Vascular: Carotid, radial artery, dorsalis pedis and  posterior tibial pulses are full and equal. No bruits present. Neurologic: Alert and oriented x3. Deep tendon reflexes symmetrical and normal.         Skin: Intact without suspicious lesions or rashes. Lymph: No cervical, axillary, or inguinal lymphadenopathy present. Psych: Mood and affect are normal. Normally interactive                                                                                         Assessment & Plan:  #1 comprehensive physical exam; no acute findings #2 see Problem List with Assessments & Recommendations  #3 screening colonoscopy recommended; Gastroenterology will be informed of his sleep apnea. Plan: see Orders

## 2012-02-01 NOTE — Assessment & Plan Note (Signed)
EKG 01/06/10 revealed low T waves in V3-C6. EKG 02/01/12 shows that the T waves are now normal in those leads. This may be  represent response to CPAP  (initiated 9/12) and better blood pressure control.

## 2012-03-06 ENCOUNTER — Other Ambulatory Visit: Payer: Managed Care, Other (non HMO) | Admitting: Internal Medicine

## 2012-03-25 ENCOUNTER — Other Ambulatory Visit: Payer: Managed Care, Other (non HMO) | Admitting: Internal Medicine

## 2012-04-18 ENCOUNTER — Other Ambulatory Visit: Payer: Self-pay | Admitting: Internal Medicine

## 2012-10-18 ENCOUNTER — Other Ambulatory Visit: Payer: Self-pay | Admitting: Internal Medicine

## 2012-10-18 NOTE — Telephone Encounter (Signed)
Rx sent.    MW 

## 2013-01-30 ENCOUNTER — Ambulatory Visit: Payer: Managed Care, Other (non HMO) | Admitting: Psychologist

## 2013-03-11 HISTORY — PX: COLONOSCOPY: SHX174

## 2013-03-11 LAB — HM COLONOSCOPY

## 2013-07-05 ENCOUNTER — Emergency Department (INDEPENDENT_AMBULATORY_CARE_PROVIDER_SITE_OTHER)
Admission: EM | Admit: 2013-07-05 | Discharge: 2013-07-05 | Disposition: A | Discharge status: Home / Self Care | Payer: Managed Care, Other (non HMO) | Source: Home / Self Care

## 2013-07-05 ENCOUNTER — Encounter (HOSPITAL_COMMUNITY): Payer: Self-pay | Admitting: Emergency Medicine

## 2013-07-05 ENCOUNTER — Emergency Department (INDEPENDENT_AMBULATORY_CARE_PROVIDER_SITE_OTHER): Payer: Managed Care, Other (non HMO)

## 2013-07-05 ENCOUNTER — Emergency Department (HOSPITAL_COMMUNITY): Payer: Managed Care, Other (non HMO)

## 2013-07-05 DIAGNOSIS — M940 Chondrocostal junction syndrome [Tietze]: Secondary | ICD-10-CM

## 2013-07-05 MED ORDER — ONDANSETRON 4 MG PO TBDP
ORAL_TABLET | ORAL | Status: AC
  Filled 2013-07-05: qty 1

## 2013-07-05 MED ORDER — ONDANSETRON 4 MG PO TBDP
4.0000 mg | ORAL_TABLET | Freq: Once | ORAL | Status: AC
  Administered 2013-07-05: 4 mg via ORAL

## 2013-07-05 MED ORDER — NAPROXEN 500 MG PO TABS: 500.0000 mg | ORAL_TABLET | Freq: Two times a day (BID) | ORAL | Status: DC

## 2013-07-05 MED ORDER — HYDROMORPHONE HCL PF 1 MG/ML IJ SOLN
INTRAMUSCULAR | Status: AC
  Filled 2013-07-05: qty 1

## 2013-07-05 MED ORDER — HYDROMORPHONE HCL 1 MG/ML IJ SOLN
1.0000 mg | Freq: Once | INTRAMUSCULAR | Status: AC
  Administered 2013-07-05: 1 mg via INTRAVENOUS

## 2013-07-05 MED ORDER — HYDROCODONE-ACETAMINOPHEN 10-325 MG PO TABS: 1.0000 | ORAL_TABLET | Freq: Four times a day (QID) | ORAL | Status: DC | PRN
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
## 2013-07-07 NOTE — ED Provider Notes (Signed)
Medical screening examination/treatment/procedure(s) were performed by a resident physician or non-physician practitioner and as the supervising physician I was immediately available for consultation/collaboration.  Clementeen Graham, MD   Rodolph Bong, MD 07/07/13 [phone]

## 2013-10-29 ENCOUNTER — Encounter: Payer: Self-pay | Admitting: Cardiovascular Disease

## 2013-10-30 ENCOUNTER — Encounter: Payer: Self-pay | Admitting: Cardiovascular Disease

## 2013-10-30 ENCOUNTER — Ambulatory Visit (INDEPENDENT_AMBULATORY_CARE_PROVIDER_SITE_OTHER): Payer: Managed Care, Other (non HMO) | Admitting: Cardiovascular Disease

## 2013-10-30 VITALS — BP 130/92 | HR 79 | Ht 72.0 in | Wt 295.0 lb

## 2013-10-30 DIAGNOSIS — Z79899 Other long term (current) drug therapy: Secondary | ICD-10-CM

## 2013-10-30 DIAGNOSIS — E785 Hyperlipidemia, unspecified: Secondary | ICD-10-CM

## 2013-10-30 DIAGNOSIS — I10 Essential (primary) hypertension: Secondary | ICD-10-CM

## 2013-10-30 DIAGNOSIS — R9431 Abnormal electrocardiogram [ECG] [EKG]: Secondary | ICD-10-CM

## 2013-10-30 DIAGNOSIS — E782 Mixed hyperlipidemia: Secondary | ICD-10-CM

## 2013-10-30 NOTE — Assessment & Plan Note (Signed)
He were to CPAP at night and benefits from this

## 2013-10-30 NOTE — Progress Notes (Signed)
10/30/2013 Samuel Goodwin   16-May-1961  [id number]  Primary Physician Marga Melnick, MD Primary Cardiologist: Runell Gess MD Roseanne Reno   HPI:  The patient is a very pleasant 52 year old, moderately overweight, married Caucasian male, father of 3 who I last saw back in May 2012. He has a strong family history for heart disease along with hypertension and hyperlipidemia. He has obstructive sleep apnea on CPAP which he benefits from. He denies chest pain or shortness of breath. His last lipid profile was recently obtained [facility]. We will recheck this.    Current Outpatient Prescriptions  Medication Sig Dispense Refill  . amoxicillin-clavulanate (AUGMENTIN) 875-125 MG per tablet       . aspirin 81 MG tablet Take 81 mg by mouth daily.      [name] atorvastatin (LIPITOR) 40 MG tablet Take 40 mg by mouth daily.      . Cholecalciferol (VITAMIN D3) 400 UNITS CAPS Take by mouth daily.      . hydrochlorothiazide (HYDRODIURIL) 25 MG tablet Take 1 tablet (25 mg total) by mouth daily.  90 tablet  3  . ibuprofen (ADVIL,MOTRIN) 400 MG tablet Take 400 mg by mouth as needed.      [name] levothyroxine (LEVOTHROID) 25 MCG tablet Take 25 mcg by mouth daily before breakfast.      . losartan (COZAAR) 50 MG tablet Take 50 mg by mouth daily.      . Multiple Vitamin (MULTIVITAMIN) tablet Take 1 tablet by mouth daily.        . naproxen (NAPROSYN) 500 MG tablet Take 1 tablet (500 mg total) by mouth 2 (two) times daily.  60 tablet  0  . omeprazole (PRILOSEC) 20 MG capsule Take 20 mg by mouth 2 (two) times daily before a meal. bid      . vitamin C (ASCORBIC ACID) 500 MG tablet Take 500 mg by mouth daily.        No current facility-administered medications for this visit.    Allergies  Allergen Reactions  . Benazepril Hcl     REACTION: COUGH    History   Social History  . Marital Status: Married    Spouse Name: N/A    Number of Children: N/A  . Years of Education: N/A    Occupational History  . Not on file.   Social History Main Topics  . Smoking status: Former Smoker    Quit date: 12/11/1978  . Smokeless tobacco: Not on file     Comment: Quit at age 50  . Alcohol Use: No  . Drug Use: No  . Sexual Activity: Yes   Other Topics Concern  . Not on file   Social History Narrative  . No narrative on file     Review of Systems: General: negative for chills, fever, night sweats or weight changes.  Cardiovascular: negative for chest pain, dyspnea on exertion, edema, orthopnea, palpitations, paroxysmal nocturnal dyspnea or shortness of breath Dermatological: negative for rash Respiratory: negative for cough or wheezing Urologic: negative for hematuria Abdominal: negative for nausea, vomiting, diarrhea, bright red blood per rectum, melena, or hematemesis Neurologic: negative for visual changes, syncope, or dizziness All other systems reviewed and are otherwise negative except as noted above.    Blood pressure 130/92, pulse 79, height 6' (1.829 m), weight 295 lb (133.811 kg).  General appearance: alert and no distress Neck: no adenopathy, no carotid bruit, no JVD, supple, symmetrical, trachea midline and thyroid not enlarged, symmetric, no tenderness/mass/nodules  Lungs: clear to auscultation bilaterally Heart: regular rate and rhythm, S1, S2 normal, no murmur, click, rub or gallop Extremities: extremities normal, atraumatic, no cyanosis or edema  EKG normal sinus rhythm at 79 without ST or T wave changes  ASSESSMENT AND PLAN:   SLEEP APNEA He were to CPAP at night and benefits from this  HYPERLIPIDEMIA [facility]in [facility]  HYPERTENSION On appropriate medications with borderline control      Runell Gess MD [facility], [facility] 10/30/2013 8:39 AM

## 2013-10-30 NOTE — Patient Instructions (Signed)
  We will see you back in follow up in 1 year with Dr Allyson Sabal  Dr Allyson Sabal has ordered blood work to be done, fasting  We have referred you to a dietician, Karenann Cai RDN, CDE, phone [phone].  She will contact you.

## 2013-10-30 NOTE — Assessment & Plan Note (Signed)
On appropriate medications with borderline control

## 2013-10-30 NOTE — Assessment & Plan Note (Signed)
On statin therapy followed by the [facility] medical system

## 2013-10-31 LAB — HEPATIC FUNCTION PANEL
ALT: 20 U/L (ref 0–53)
AST: 15 U/L (ref 0–37)
Albumin: 4.1 g/dL (ref 3.5–5.2)
Alkaline Phosphatase: 64 U/L (ref 39–117)
Bilirubin, Direct: 0.1 mg/dL (ref 0.0–0.3)
Indirect Bilirubin: 0.4 mg/dL (ref 0.0–0.9)
Total Bilirubin: 0.5 mg/dL (ref 0.3–1.2)
Total Protein: 6.6 g/dL (ref 6.0–8.3)

## 2013-10-31 LAB — LIPID PANEL
Cholesterol: 127 mg/dL (ref 0–200)
HDL: 42 mg/dL (ref >39)
LDL Cholesterol: 67 mg/dL (ref 0–99)
Total CHOL/HDL Ratio: 3 Ratio
Triglycerides: 88 mg/dL (ref <150)
VLDL: 18 mg/dL (ref 0–40)

## 2013-11-03 ENCOUNTER — Encounter: Payer: Self-pay | Admitting: *Deleted

## 2013-12-31 ENCOUNTER — Telehealth: Payer: Self-pay | Admitting: Cardiovascular Disease

## 2013-12-31 NOTE — Telephone Encounter (Signed)
Calling to talk to you about his bp and other issues .[name] Please Call    Thanks

## 2013-12-31 NOTE — Telephone Encounter (Signed)
Pt called to state that his PCP was retiring and wanted suggestions for other PCPs in the area.  Gave 3 different names. Pt thanked and said he would call one.

## 2014-01-23 ENCOUNTER — Encounter: Payer: Self-pay | Admitting: Internal Medicine

## 2014-01-23 ENCOUNTER — Ambulatory Visit (INDEPENDENT_AMBULATORY_CARE_PROVIDER_SITE_OTHER): Payer: Managed Care, Other (non HMO) | Admitting: Internal Medicine

## 2014-01-23 VITALS — BP 120/80 | HR 69 | Temp 97.4°F | Wt 293.0 lb

## 2014-01-23 DIAGNOSIS — H5500 Unspecified nystagmus: Secondary | ICD-10-CM

## 2014-01-23 DIAGNOSIS — H8110 Benign paroxysmal vertigo, unspecified ear: Secondary | ICD-10-CM

## 2014-01-23 DIAGNOSIS — H5509 Other forms of nystagmus: Secondary | ICD-10-CM

## 2014-01-23 DIAGNOSIS — R42 Dizziness and giddiness: Secondary | ICD-10-CM

## 2014-01-23 DIAGNOSIS — H9311 Tinnitus, right ear: Secondary | ICD-10-CM

## 2014-01-23 DIAGNOSIS — H9319 Tinnitus, unspecified ear: Secondary | ICD-10-CM

## 2014-01-23 NOTE — Progress Notes (Signed)
   Subjective:    Patient ID: Samuel Goodwin, male    DOB: 1960/12/20, 53 y.o.   MRN: [id number]  HPI Symptoms began last Wednesday, 01/14/14 as low-grade fever and pain in R ear described as dull, aching.  The fever and ear pain was accompanied by dizziness and loss of balance.  Reports he was walking down hallway and felt dizzy and his heart racing. His shoulder hit on the wall, then descended to floor;  denies hitting head or any injury from the incident. He says he did not feel well for the rest of the day; denies LOC.   Was treated with 10-day course of Amoxicillin for AOM of R ear in January. Symptoms subsided until 2/4.   Review of Systems Denies frontal HA, dental pain, nasal congestion, cough.     Objective:   Physical Exam        Assessment & Plan:

## 2014-01-23 NOTE — Patient Instructions (Signed)
Repeat the isometric exercises discussed 4- 5 times prior to standing if you've been seated for a period of time. Go to Web M.D. for information on benign positional vertigo (BPV) . Physical therapy exercises can treat that. Flonase OR Nasacort AQ 1 spray in each nostril twice a day as needed. Use the "crossover" technique into opposite nostril spraying toward opposite ear @ 45 degree angle, not straight up into nostril.

## 2014-01-23 NOTE — Progress Notes (Signed)
Pre visit review using our clinic review tool, if applicable. No additional management support is needed unless otherwise documented below in the visit note. 

## 2014-01-23 NOTE — Progress Notes (Signed)
   Subjective:    Patient ID: Samuel Goodwin, male    DOB: 01/15/1961, 53 y.o.   MRN: [id number]  HPI Symptoms began last Wednesday, 01/14/14 as low-grade fever and pain in R ear described as dull, aching.  The fever and R ear pain was accompanied by dizziness and loss of balance. Reports he was walking down hallway and felt dizzy. His shoulder hit on the wall, then he descended to floor;  denies hitting head or any injury from the incident. He says he did not feel well for the rest of the day; denies LOC.  Was treated with 10-day course of Amoxicillin for AOM of R ear in January. Symptoms subsided until 2/4.  Reports tinnitus on R. .   He has been seen at [facility] for laser surgery of the larynx. At that time he apparently had tilt table evaluation which was negative.     Review of Systems Denies cardiac or neural prodrome prior to event Denies frontal HA, dental pain, nasal congestion, cough.     Objective:   Physical Exam General appearance:good health ;well nourished; no acute distress or increased work of breathing is present.  No  lymphadenopathy about the head, neck, or axilla noted.   Eyes: No conjunctival inflammation or lid edema is present. There is no scleral icterus.  R > L lateral gaze nystagmus.   Ears:  External ear exam shows no significant lesions or deformities.  Otoscopic examination reveals L clear canal, tympanic membranes are intact bilaterally without bulging, retraction, inflammation or discharge. Dull TM on R with decreased light reflex.Tuning fork exam normal.   Nose:  External nasal examination shows no deformity or inflammation. Nasal mucosa are pink and moist without lesions or exudates. No septal dislocation or deviation.No obstruction to airflow.   Oral exam: Dental hygiene is good; lips and gums are healthy appearing.There is no oropharyngeal erythema or exudate noted.   Neck:  No deformities, thyromegaly, masses, or tenderness noted.   Supple with  full range of motion without pain.   Heart:  Normal rate and regular rhythm. Distant S1 and S2 normal without gallop, murmur, click, rub or other extra sounds. No carotid bruits  Lungs:Chest clear to auscultation; no wheezes, rhonchi,rales ,or rubs present.No increased work of breathing.    Extremities:  No cyanosis, edema, or clubbing  noted   Neuro: Cranial nerve exam revealed no deficits; deep tendon reflexes were normal; gait normal; balance normal ; Romberg/ finger to nose  testing normal.Strength & tone normal .   Skin: Warm & dry w/o jaundice or tenting.     Assessment & Plan:  #1 dizziness with components of benign positional vertigo and postural lightheadedness. Pilar Plate vertigo is absent.   #2 tinnitus  #3 unsustained bilateral nystagmus, worse with right lateral gaze than left   See orders

## 2014-08-28 ENCOUNTER — Encounter: Payer: Self-pay | Admitting: Endocrinology

## 2014-08-28 ENCOUNTER — Ambulatory Visit (INDEPENDENT_AMBULATORY_CARE_PROVIDER_SITE_OTHER): Payer: Managed Care, Other (non HMO) | Admitting: Endocrinology

## 2014-08-28 VITALS — BP 112/80 | HR 74 | Temp 97.6°F | Wt 291.0 lb

## 2014-08-28 DIAGNOSIS — E039 Hypothyroidism, unspecified: Secondary | ICD-10-CM

## 2014-08-28 DIAGNOSIS — B1920 Unspecified viral hepatitis C without hepatic coma: Secondary | ICD-10-CM | POA: Insufficient documentation

## 2014-08-28 HISTORY — DX: Unspecified viral hepatitis C without hepatic coma: B19.20

## 2014-08-28 LAB — TSH: TSH: 1.48 u[IU]/mL (ref 0.35–4.50)

## 2014-08-28 LAB — T4, FREE: Free T4: 1.2 ng/dL (ref 0.60–1.60)

## 2014-08-28 NOTE — Progress Notes (Signed)
Subjective:    Patient ID: Samuel Goodwin, male    DOB: 21-Feb-1961, 53 y.o.   MRN: [id number]  HPI Pt reports hypothyroidism was dx'ed in 1996.  He has been on prescribed thyroid hormone therapy since then.  It was increased to 200+25 mcg/day approx 3 weeks ago.  He has never taken kelp or any other type of non-prescribed thyroid product.  He has never had thyroid imaging.   He has never had thyroid surgery, or XRT to the neck.  He has never been on amiodarone or lithium, but he was rx'ed for hepatitis-C in the 1990's.  He has moderate pain throughout the body, but worst at the left knee.  He has assoc weight gain.  Past Medical History  Diagnosis Date  . Hepatitis C 1994    ? etiology; in [place] 1989-91; S/P interferon , Dr [name]  . Hyperlipidemia   . Hypertension   . Hypothyroidism   . GERD (gastroesophageal reflux disease)   . Alcohol abuse     Sobriety since 1990  . Benign positional vertigo   . OSA (obstructive sleep apnea)     on CPAP    Past Surgical History  Procedure Laterality Date  . Throat surgery      vocal cord  laser sugery X 3, Dr [name] , [facility]  . Upper gastrointestinal endoscopy  2010    Negative, Dr.[name]  . Laryngoscopy  08/2009    Dr.[name]  . Cardiovascular stress test  02/09/2010    No scintigraphic evidence of inducible ischemia.  . Transthoracic echocardiogram  11/08/2005    EF 68%, normal LV systolic function    History   Social History  . Marital Status: Married    Spouse Name: N/A    Number of Children: N/A  . Years of Education: N/A   Occupational History  . Not on file.   Social History Main Topics  . Smoking status: Former Smoker    Quit date: 12/11/1978  . Smokeless tobacco: Not on file     Comment: Quit at age 23  . Alcohol Use: No  . Drug Use: No  . Sexual Activity: Yes   Other Topics Concern  . Not on file   Social History Narrative  . No narrative on file    Current Outpatient Prescriptions on File Prior to Visit    Medication Sig Dispense Refill  . aspirin 81 MG tablet Take 81 mg by mouth daily.      [name] atorvastatin (LIPITOR) 40 MG tablet Take 40 mg by mouth daily.      . Cholecalciferol (VITAMIN D3) 400 UNITS CAPS Take by mouth daily.      . hydrochlorothiazide (HYDRODIURIL) 25 MG tablet Take 1 tablet (25 mg total) by mouth daily.  90 tablet  3  . ibuprofen (ADVIL,MOTRIN) 400 MG tablet Take 400 mg by mouth as needed.      [name] levothyroxine (LEVOTHROID) 25 MCG tablet Take 25 mcg by mouth daily before breakfast.      . losartan (COZAAR) 50 MG tablet Take 50 mg by mouth daily.      . Multiple Vitamin (MULTIVITAMIN) tablet Take 1 tablet by mouth daily.        . naproxen (NAPROSYN) 500 MG tablet Take 1 tablet (500 mg total) by mouth 2 (two) times daily.  60 tablet  0  . omeprazole (PRILOSEC) 20 MG capsule Take 20 mg by mouth 2 (two) times daily before a meal. bid      .  vitamin C (ASCORBIC ACID) 500 MG tablet Take 500 mg by mouth daily.        No current facility-administered medications on file prior to visit.    Allergies  Allergen Reactions  . Benazepril Hcl     REACTION: COUGH    Family History  Problem Relation Age of Onset  . Cirrhosis Father   . Diabetes Father   . Stroke Father   . Heart failure Mother   . Hypertension Mother   . Heart attack Brother 15  . Aneurysm Maternal Aunt      AAA  . Coronary artery disease Maternal Uncle     MI late 46s  . Thyroid disease Mother     BP 112/80  Pulse 74  Temp(Src) 97.6 F (36.4 C) (Oral)  Wt 291 lb (131.997 kg)  SpO2 93%     Review of Systems denies sob, fever, constipation, numbness, blurry vision, cold intolerance, dry skin, rhinorrhea, easy bruising, and syncope.   He has leg cramps and depression.  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      Objective:   Physical Exam VS: see vs page GEN: no distress.  Morbid obesity.  HEAD: head: no deformity.  eyes: no periorbital swelling, no proptosis external nose and ears are normal mouth:  no lesion seen NECK: supple, thyroid is not enlarged CHEST WALL: no deformity LUNGS: clear to auscultation BREASTS:  bilat pseudogynecomastia CV: reg rate and rhythm, no murmur ABD: abdomen is soft, nontender.  no hepatosplenomegaly.  not distended.  no hernia.   MUSCULOSKELETAL: muscle bulk and strength are grossly normal.  no obvious joint swelling.  gait is normal and steady, except that he favors LLE.   EXTEMITIES: no deformity.  no ulcer on the feet.  feet are of normal color and temp.  1+ bilat leg edema PULSES: dorsalis pedis intact bilat.  no carotid bruit NEURO:  cn 2-12 grossly intact.   readily moves all 4's.  sensation is intact to touch on the feet SKIN:  Normal texture and temperature.  No rash or suspicious lesion is visible.  Normal hair distribution, except for male-pattern hair loss.   NODES:  None palpable at the neck.  PSYCH: alert, well-oriented.  Does not appear anxious nor depressed.   i have reviewed the following outside records: Office notes  Radiol: i reviewed all reports in epic.  None makes any mention of a goiter  i reviewed electrocardiogram (10/30/13)  Lab Results  Component Value Date   TSH 1.48 08/28/2014      Assessment & Plan:  Hypothyroidism, new to me, well-replaced. Chronic pain syndrome, not thyroid-related.  Patient is advised the following: Patient Instructions  blood tests are being requested for you today.  We'll contact you with results. If it is normal, you should assume that the thyroid is not the cause of your symptoms.  Please let me know if you want to pursue the weight-loss surgery.   I would be happy to see you back here whenever you want.                                    Hypothyroidism The thyroid is a large gland located in the lower front of your neck. The thyroid gland helps control metabolism. Metabolism is how your body handles food. It controls metabolism with the hormone thyroxine.  When this gland is underactive (hypothyroid), it produces too little hormone.  CAUSES  These include:   Absence or destruction of thyroid tissue.  Goiter due to iodine deficiency.  Goiter due to medications.  Congenital defects (since birth).  Problems with the pituitary. This causes a lack of TSH (thyroid stimulating hormone). This hormone tells the thyroid to turn out more hormone. SYMPTOMS  Lethargy (feeling as though you have no energy)  Cold intolerance  Weight gain (in spite of normal food intake)  Dry skin  Coarse hair  Menstrual irregularity (if severe, may lead to infertility)  Slowing of thought processes Cardiac problems are also caused by insufficient amounts of thyroid hormone. Hypothyroidism in the newborn is cretinism, and is an extreme form. It is important that this form be treated adequately and immediately or it will lead rapidly to retarded physical and mental development. DIAGNOSIS  To prove hypothyroidism, your caregiver may do blood tests and ultrasound tests. Sometimes the signs are hidden. It may be necessary for your caregiver to watch this illness with blood tests either before or after diagnosis and treatment. TREATMENT  Low levels of thyroid hormone are increased by using synthetic thyroid hormone. This is a safe, effective treatment. It usually takes about four weeks to gain the full effects of the medication. After you have the full effect of the medication, it will generally take another four weeks for problems to leave. Your caregiver may start you on low doses. If you have had heart problems the dose may be gradually increased. It is generally not an emergency to get rapidly to normal. HOME CARE INSTRUCTIONS   Take your medications as your caregiver suggests. Let your caregiver know of any medications you are taking or start taking. Your caregiver will help you with dosage schedules.  As your condition improves, your dosage needs may increase. It  will be necessary to have continuing blood tests as suggested by your caregiver.  Report all suspected medication side effects to your caregiver. SEEK MEDICAL CARE IF: Seek medical care if you develop:  Sweating.  Tremulousness (tremors).  Anxiety.  Rapid weight loss.  Heat intolerance.  Emotional swings.  Diarrhea.  Weakness. SEEK IMMEDIATE MEDICAL CARE IF:  You develop chest pain, an irregular heart beat (palpitations), or a rapid heart beat. MAKE SURE YOU:   Understand these instructions.  Will watch your condition.  Will get help right away if you are not doing well or get worse. Document Released: 11/27/2005 Document Revised: 02/19/2012 Document Reviewed: 07/17/2008 [name] Patient Information 2015 [place], [place]. This information is not intended to replace advice given to you by your health care provider. Make sure you discuss any questions you have with your health care provider.

## 2014-08-28 NOTE — Patient Instructions (Addendum)
blood tests are being requested for you today.  We'll contact you with results. If it is normal, you should assume that the thyroid is not the cause of your symptoms.  Please let me know if you want to pursue the weight-loss surgery.   I would be happy to see you back here whenever you want.                                    Hypothyroidism The thyroid is a large gland located in the lower front of your neck. The thyroid gland helps control metabolism. Metabolism is how your body handles food. It controls metabolism with the hormone thyroxine. When this gland is underactive (hypothyroid), it produces too little hormone.  CAUSES These include:   Absence or destruction of thyroid tissue.  Goiter due to iodine deficiency.  Goiter due to medications.  Congenital defects (since birth).  Problems with the pituitary. This causes a lack of TSH (thyroid stimulating hormone). This hormone tells the thyroid to turn out more hormone. SYMPTOMS  Lethargy (feeling as though you have no energy)  Cold intolerance  Weight gain (in spite of normal food intake)  Dry skin  Coarse hair  Menstrual irregularity (if severe, may lead to infertility)  Slowing of thought processes Cardiac problems are also caused by insufficient amounts of thyroid hormone. Hypothyroidism in the newborn is cretinism, and is an extreme form. It is important that this form be treated adequately and immediately or it will lead rapidly to retarded physical and mental development. DIAGNOSIS  To prove hypothyroidism, your caregiver may do blood tests and ultrasound tests. Sometimes the signs are hidden. It may be necessary for your caregiver to watch this illness with blood tests either before or after diagnosis and treatment. TREATMENT  Low levels of thyroid hormone are increased by using synthetic thyroid hormone. This is a safe, effective treatment. It usually takes about four weeks to gain the  full effects of the medication. After you have the full effect of the medication, it will generally take another four weeks for problems to leave. Your caregiver may start you on low doses. If you have had heart problems the dose may be gradually increased. It is generally not an emergency to get rapidly to normal. HOME CARE INSTRUCTIONS   Take your medications as your caregiver suggests. Let your caregiver know of any medications you are taking or start taking. Your caregiver will help you with dosage schedules.  As your condition improves, your dosage needs may increase. It will be necessary to have continuing blood tests as suggested by your caregiver.  Report all suspected medication side effects to your caregiver. SEEK MEDICAL CARE IF: Seek medical care if you develop:  Sweating.  Tremulousness (tremors).  Anxiety.  Rapid weight loss.  Heat intolerance.  Emotional swings.  Diarrhea.  Weakness. SEEK IMMEDIATE MEDICAL CARE IF:  You develop chest pain, an irregular heart beat (palpitations), or a rapid heart beat. MAKE SURE YOU:   Understand these instructions.  Will watch your condition.  Will get help right away if you are not doing well or get worse. Document Released: 11/27/2005 Document Revised: 02/19/2012 Document Reviewed: 07/17/2008 [facility] Patient Information 2015 [place], [place]. This information is not intended to replace advice given to you by your health care provider. Make sure you discuss any questions you have with your health care provider.

## 2014-10-16 ENCOUNTER — Ambulatory Visit: Payer: Managed Care, Other (non HMO) | Admitting: Internal Medicine

## 2014-10-30 ENCOUNTER — Ambulatory Visit (INDEPENDENT_AMBULATORY_CARE_PROVIDER_SITE_OTHER): Payer: Managed Care, Other (non HMO) | Admitting: Cardiovascular Disease

## 2014-10-30 ENCOUNTER — Ambulatory Visit (INDEPENDENT_AMBULATORY_CARE_PROVIDER_SITE_OTHER): Payer: Managed Care, Other (non HMO)

## 2014-10-30 ENCOUNTER — Encounter: Payer: Self-pay | Admitting: Cardiovascular Disease

## 2014-10-30 VITALS — BP 118/86 | HR 70 | Ht 72.0 in | Wt 287.3 lb

## 2014-10-30 DIAGNOSIS — Z79899 Other long term (current) drug therapy: Secondary | ICD-10-CM

## 2014-10-30 DIAGNOSIS — G4730 Sleep apnea, unspecified: Secondary | ICD-10-CM

## 2014-10-30 DIAGNOSIS — Z23 Encounter for immunization: Secondary | ICD-10-CM

## 2014-10-30 DIAGNOSIS — I10 Essential (primary) hypertension: Secondary | ICD-10-CM

## 2014-10-30 DIAGNOSIS — E785 Hyperlipidemia, unspecified: Secondary | ICD-10-CM

## 2014-10-30 NOTE — Assessment & Plan Note (Addendum)
History of obstructive sleep apnea on C Pap Which benefits from

## 2014-10-30 NOTE — Progress Notes (Signed)
10/30/2014 Samuel Goodwin   [birth year]  [id number]  Primary Physician [name], MD Primary Cardiologist: Lorretta Harp MD [name]   HPI:  The patient is a very pleasant 53 year old, moderately overweight, married Caucasian male, father of 3 who I last saw one year ago. He has a strong family history for heart disease along with hypertension and hyperlipidemia. He has obstructive sleep apnea on CPAP which he benefits from. He denies chest pain or shortness of breath. He is trying to lose weight with marginal success. He is scheduled for left total knee replacement by Dr. [name] next month which I'm clearing him for at low risk.   Current Outpatient Prescriptions  Medication Sig Dispense Refill  . aspirin 81 MG tablet Take 81 mg by mouth daily.    [name] atorvastatin (LIPITOR) 40 MG tablet Take 40 mg by mouth daily.    . Cholecalciferol (VITAMIN D3) 400 UNITS CAPS Take by mouth daily.    . hydrochlorothiazide (HYDRODIURIL) 25 MG tablet Take 1 tablet (25 mg total) by mouth daily. 90 tablet 3  . ibuprofen (ADVIL,MOTRIN) 400 MG tablet Take 400 mg by mouth as needed.    [name] levothyroxine (LEVOTHROID) 25 MCG tablet Take 25 mcg by mouth daily before breakfast.    . levothyroxine (SYNTHROID, LEVOTHROID) 200 MCG tablet Take 200 mcg by mouth daily before breakfast.    . losartan (COZAAR) 50 MG tablet Take 50 mg by mouth daily.    . Multiple Vitamin (MULTIVITAMIN) tablet Take 1 tablet by mouth daily.      . naproxen (NAPROSYN) 500 MG tablet Take 1 tablet (500 mg total) by mouth 2 (two) times daily. 60 tablet 0  . omeprazole (PRILOSEC) 20 MG capsule Take 20 mg by mouth 2 (two) times daily before a meal. bid    . vitamin C (ASCORBIC ACID) 500 MG tablet Take 500 mg by mouth daily.      No current facility-administered medications for this visit.    Allergies  Allergen Reactions  . Benazepril Hcl     REACTION: COUGH    History   Social History  . Marital Status:  Married    Spouse Name: N/A    Number of Children: N/A  . Years of Education: N/A   Occupational History  . Not on file.   Social History Main Topics  . Smoking status: Former Smoker    Quit date: 12/11/1978  . Smokeless tobacco: Not on file     Comment: Quit at age 74  . Alcohol Use: No  . Drug Use: No  . Sexual Activity: Yes   Other Topics Concern  . Not on file   Social History Narrative     Review of Systems: General: negative for chills, fever, night sweats or weight changes.  Cardiovascular: negative for chest pain, dyspnea on exertion, edema, orthopnea, palpitations, paroxysmal nocturnal dyspnea or shortness of breath Dermatological: negative for rash Respiratory: negative for cough or wheezing Urologic: negative for hematuria Abdominal: negative for nausea, vomiting, diarrhea, bright red blood per rectum, melena, or hematemesis Neurologic: negative for visual changes, syncope, or dizziness All other systems reviewed and are otherwise negative except as noted above.    Blood pressure 118/86, pulse 70, height 6' (1.829 m), weight 287 lb 4.8 oz (130.318 kg).  General appearance: alert and no distress Neck: no adenopathy, no carotid bruit, no JVD, supple, symmetrical, trachea midline and thyroid not enlarged, symmetric, no tenderness/mass/nodules Lungs: clear to auscultation bilaterally Heart: regular rate  and rhythm, S1, S2 normal, no murmur, click, rub or gallop Extremities: extremities normal, atraumatic, no cyanosis or edema  EKG normal/70 without ST or T-wave changes. A personal review this EKG  ASSESSMENT AND PLAN:   Essential hypertension History of hypertension with blood pressure this morning measured at 118/86. He is on hydro-diarrheal and losartan. Continue current medicines at current dosing  HYPERLIPIDEMIA History of hyperlipidemia on atorvastatin 40 mg daily. We will recheck a lipid and liver profile  Sleep apnea History of obstructive sleep  apnea on C Pap Which benefits from      Lorretta Harp MD [facility], [facility] 10/30/2014 8:58 AM

## 2014-10-30 NOTE — Assessment & Plan Note (Signed)
History of hypertension with blood pressure this morning measured at 118/86. He is on hydro-diarrheal and losartan. Continue current medicines at current dosing

## 2014-10-30 NOTE — Assessment & Plan Note (Signed)
History of hyperlipidemia on atorvastatin 40 mg daily. We will recheck a lipid and liver profile

## 2014-10-30 NOTE — Patient Instructions (Addendum)
Today Dr. Gwenlyn Found has ordered for you to have lab work done, and you MUST be FASTING.  Your physician wants you to follow-up in 1 year with Dr. Gwenlyn Found. You will receive a reminder letter in the mail 2 months in advance. If you do not receive a letter, please call our office to schedule the follow-up appointment.

## 2014-11-03 LAB — HEPATIC FUNCTION PANEL
ALT: 19 U/L (ref 0–53)
AST: 13 U/L (ref 0–37)
Albumin: 3.6 g/dL (ref 3.5–5.2)
Alkaline Phosphatase: 59 U/L (ref 39–117)
BILIRUBIN DIRECT: 0.1 mg/dL (ref 0.0–0.3)
BILIRUBIN TOTAL: 0.5 mg/dL (ref 0.2–1.2)
Indirect Bilirubin: 0.4 mg/dL (ref 0.2–1.2)
Total Protein: 5.7 g/dL — ABNORMAL LOW (ref 6.0–8.3)

## 2014-11-03 LAB — LIPID PANEL
CHOL/HDL RATIO: 3.2 ratio
Cholesterol: 142 mg/dL (ref 0–200)
HDL: 44 mg/dL (ref >39)
LDL CALC: 79 mg/dL (ref 0–99)
Triglycerides: 95 mg/dL (ref <150)
VLDL: 19 mg/dL (ref 0–40)

## 2014-11-18 NOTE — H&P (Signed)
Samuel Goodwin is an 53 y.o. male.    Chief Complaint: left knee pain  HPI: Pt is a 53 y.o. male complaining of left knee pain for multiple years. Pain had continually increased since the beginning. X-rays in the clinic show end-stage arthritic changes of the left knee. Pt has tried various conservative treatments which have failed to alleviate their symptoms, including injections and therapy. Various options are discussed with the patient. Risks, benefits and expectations were discussed with the patient. Patient understand the risks, benefits and expectations and wishes to proceed with surgery.   PCP:  [name], MD  D/C Plans:  Home with HHPT  PMH: Past Medical History  Diagnosis Date  . Hepatitis C 1994    ? etiology; in [place] 1989-91; S/P interferon , Dr [name]  . Hyperlipidemia   . Hypertension   . Hypothyroidism   . GERD (gastroesophageal reflux disease)   . Alcohol abuse     Sobriety since 1990  . Benign positional vertigo   . OSA (obstructive sleep apnea)     on CPAP    PSH: Past Surgical History  Procedure Laterality Date  . Throat surgery      vocal cord  laser sugery X 3, Dr [name] , [facility]  . Upper gastrointestinal endoscopy  2010    Negative, Dr.[name]  . Laryngoscopy  08/2009    Dr.[name]  . Cardiovascular stress test  02/09/2010    No scintigraphic evidence of inducible ischemia.  . Transthoracic echocardiogram  11/08/2005    EF 68%, normal LV systolic function    Social History:  reports that he quit smoking about 35 years ago. He does not have any smokeless tobacco history on file. He reports that he does not drink alcohol or use illicit drugs.  Allergies:  Allergies  Allergen Reactions  . Benazepril Hcl Cough    Medications: No current facility-administered medications for this encounter.   Current Outpatient Prescriptions  Medication Sig Dispense Refill  . ascorbic acid (VITAMIN C) 1000 MG tablet Take 1,000 mg by mouth every morning.    [name]  aspirin EC 81 MG tablet Take 81 mg by mouth daily.    [name] atorvastatin (LIPITOR) 40 MG tablet Take 40 mg by mouth every evening.     . Cholecalciferol (VITAMIN D PO) Take 2 capsules by mouth every morning.    . hydrochlorothiazide (HYDRODIURIL) 25 MG tablet Take 1 tablet (25 mg total) by mouth daily. (Patient taking differently: Take 25 mg by mouth every morning. ) 90 tablet 3  . ibuprofen (ADVIL,MOTRIN) 200 MG tablet Take 800 mg by mouth daily as needed for mild pain.    [name] levothyroxine (LEVOTHROID) 25 MCG tablet Take 25 mcg by mouth daily before breakfast.    . levothyroxine (SYNTHROID, LEVOTHROID) 200 MCG tablet Take 200 mcg by mouth daily before breakfast.    . losartan (COZAAR) 50 MG tablet Take 50 mg by mouth daily.    . Multiple Vitamin (MULTIVITAMIN) tablet Take 1 tablet by mouth every morning.     . naproxen (NAPROSYN) 500 MG tablet Take 1 tablet (500 mg total) by mouth 2 (two) times daily. 60 tablet 0  . omeprazole (PRILOSEC) 20 MG capsule Take 20 mg by mouth 2 (two) times daily before a meal.       No results found for this or any previous visit (from the past 48 hour(s)). No results found.  ROS: Pain with rom of the left lower extremity  Physical Exam:  Alert and  oriented 53 y.o. male in no acute distress Cranial nerves 2-12 intact Cervical spine: full rom with no tenderness, nv intact distally Chest: active breath sounds bilaterally, no wheeze rhonchi or rales Heart: regular rate and rhythm, no murmur Abd: non tender non distended with active bowel sounds Hip is stable with rom  Left knee with moderate crepitus and medial joint line tenderness nv intact distally No rashes or edema Slight antalgic gait  Assessment/Plan Assessment: left knee end stage osteoarthritis  Plan: Patient will undergo a left total knee arthroplasty by Dr. [name] at [facility]. Risks benefits and expectations were discussed with the patient. Patient understand risks, benefits and expectations  and wishes to proceed.

## 2014-11-18 NOTE — Pre-Procedure Instructions (Signed)
DAVONE SHINAULT  11/18/2014   Your procedure is scheduled on:  Friday, November 27, 2014 at 9:00 AM.   Report to [facility] Entrance "A" Admitting Office at 7:00 AM.   Call this number if you have problems the morning of surgery: [phone]                Any questions prior to day of surgery, please call [phone] between 8 & 4 PM.    Remember:   Do not eat food or drink liquids after midnight.   Take these medicines the morning of surgery with A SIP OF WATER: levothyroxine (SYNTHROID, LEVOTHROID), omeprazole (PRILOSEC)  Stop Aspirin, Vitamins and NSAIDS (Naprosyn, Ibuprofen) as of tomorrow, 11/20/14.   Do not wear jewelry.  Do not wear lotions, powders, or cologne. You may wear deodorant.  Men may shave face and neck.  Do not bring valuables to the hospital.  [facility] is not responsible                  for any belongings or valuables.               Contacts, dentures or bridgework may not be worn into surgery.  Leave suitcase in the car. After surgery it may be brought to your room.  For patients admitted to the hospital, discharge time is determined by your                treatment team.              Special Instructions: See "Preparing for Surgery"   Please read over the following fact sheets that you were given: Pain Booklet, Coughing and Deep Breathing, MRSA Information and Surgical Site Infection Prevention

## 2014-11-19 ENCOUNTER — Encounter (HOSPITAL_COMMUNITY): Payer: Self-pay

## 2014-11-19 ENCOUNTER — Encounter (HOSPITAL_COMMUNITY)
Admission: RE | Admit: 2014-11-19 | Discharge: 2014-11-19 | Disposition: A | Discharge status: Home / Self Care | Payer: Worker's Compensation | Source: Ambulatory Visit | Attending: Orthopedic Surgery | Admitting: Orthopedic Surgery

## 2014-11-19 DIAGNOSIS — Z01812 Encounter for preprocedural laboratory examination: Secondary | ICD-10-CM | POA: Insufficient documentation

## 2014-11-19 HISTORY — DX: Post-traumatic stress disorder, unspecified: F43.10

## 2014-11-19 HISTORY — DX: Unspecified osteoarthritis, unspecified site: M19.90

## 2014-11-19 HISTORY — DX: Anxiety disorder, unspecified: F41.9

## 2014-11-19 HISTORY — DX: Depression, unspecified: F32.A

## 2014-11-19 HISTORY — DX: Major depressive disorder, single episode, unspecified: F32.9

## 2014-11-19 LAB — COMPREHENSIVE METABOLIC PANEL
ALBUMIN: 3.8 g/dL (ref 3.5–5.2)
ALT: 17 U/L (ref 0–53)
AST: 13 U/L (ref 0–37)
Alkaline Phosphatase: 77 U/L (ref 39–117)
Anion gap: 12 (ref 5–15)
BILIRUBIN TOTAL: 0.5 mg/dL (ref 0.3–1.2)
BUN: 11 mg/dL (ref 6–23)
CHLORIDE: 101 meq/L (ref 96–112)
CO2: 28 mEq/L (ref 19–32)
Calcium: 9.3 mg/dL (ref 8.4–10.5)
Creatinine, Ser: 0.71 mg/dL (ref 0.50–1.35)
GFR calc Af Amer: >90 mL/min (ref >90)
GFR calc non Af Amer: >90 mL/min (ref >90)
Glucose, Bld: 87 mg/dL (ref 70–99)
POTASSIUM: 4 meq/L (ref 3.7–5.3)
Sodium: 141 mEq/L (ref 137–147)
Total Protein: 7.1 g/dL (ref 6.0–8.3)

## 2014-11-19 LAB — CBC
HEMATOCRIT: 42.3 % (ref 39.0–52.0)
HEMOGLOBIN: 14.2 g/dL (ref 13.0–17.0)
MCH: 29.2 pg (ref 26.0–34.0)
MCHC: 33.6 g/dL (ref 30.0–36.0)
MCV: 87 fL (ref 78.0–100.0)
Platelets: 202 10*3/uL (ref 150–400)
RBC: 4.86 MIL/uL (ref 4.22–5.81)
RDW: 13.1 % (ref 11.5–15.5)
WBC: 7.3 10*3/uL (ref 4.0–10.5)

## 2014-11-19 LAB — PROTIME-INR
INR: 1 (ref 0.00–1.49)
Prothrombin Time: 13.3 seconds (ref 11.6–15.2)

## 2014-11-19 LAB — SURGICAL PCR SCREEN
MRSA, PCR: NEGATIVE
Staphylococcus aureus: POSITIVE — AB

## 2014-11-19 LAB — APTT: APTT: 28 s (ref 24–37)

## 2014-11-19 NOTE — Progress Notes (Signed)
Primary - dr. Ardeth Perfect Cardiologist - dr. Gwenlyn Found Clearance on chart ekg in epic from nov 2015

## 2014-11-19 NOTE — Progress Notes (Signed)
Called in rx for mupirocin to [facility] on [address] per patient request

## 2014-11-26 MED ORDER — DEXTROSE 5 % IV SOLN
3.0000 g | INTRAVENOUS | Status: AC
  Administered 2014-11-27: 3 g via INTRAVENOUS
  Filled 2014-11-26: qty 3000

## 2014-11-27 ENCOUNTER — Inpatient Hospital Stay (HOSPITAL_COMMUNITY): Payer: Worker's Compensation

## 2014-11-27 ENCOUNTER — Inpatient Hospital Stay (HOSPITAL_COMMUNITY): Payer: Worker's Compensation | Admitting: Anesthesiology

## 2014-11-27 ENCOUNTER — Encounter (HOSPITAL_COMMUNITY): Payer: Self-pay | Admitting: *Deleted

## 2014-11-27 ENCOUNTER — Inpatient Hospital Stay (HOSPITAL_COMMUNITY)
Admission: RE | Admit: 2014-11-27 | Discharge: 2014-11-30 | DRG: 470 | Disposition: A | Discharge status: Skilled Nursing Facility | Payer: Worker's Compensation | Source: Ambulatory Visit | Attending: Orthopedic Surgery | Admitting: Orthopedic Surgery

## 2014-11-27 ENCOUNTER — Encounter (HOSPITAL_COMMUNITY)
Admission: RE | Disposition: A | Discharge status: Skilled Nursing Facility | Payer: Self-pay | Source: Ambulatory Visit | Attending: Orthopedic Surgery

## 2014-11-27 DIAGNOSIS — Z7982 Long term (current) use of aspirin: Secondary | ICD-10-CM

## 2014-11-27 DIAGNOSIS — M1712 Unilateral primary osteoarthritis, left knee: Secondary | ICD-10-CM | POA: Diagnosis present

## 2014-11-27 DIAGNOSIS — M25562 Pain in left knee: Secondary | ICD-10-CM | POA: Diagnosis present

## 2014-11-27 DIAGNOSIS — I10 Essential (primary) hypertension: Secondary | ICD-10-CM | POA: Diagnosis present

## 2014-11-27 DIAGNOSIS — F1010 Alcohol abuse, uncomplicated: Secondary | ICD-10-CM | POA: Diagnosis present

## 2014-11-27 DIAGNOSIS — E039 Hypothyroidism, unspecified: Secondary | ICD-10-CM | POA: Diagnosis present

## 2014-11-27 DIAGNOSIS — E785 Hyperlipidemia, unspecified: Secondary | ICD-10-CM | POA: Diagnosis present

## 2014-11-27 DIAGNOSIS — G4733 Obstructive sleep apnea (adult) (pediatric): Secondary | ICD-10-CM | POA: Diagnosis present

## 2014-11-27 DIAGNOSIS — Z96659 Presence of unspecified artificial knee joint: Secondary | ICD-10-CM

## 2014-11-27 DIAGNOSIS — B1920 Unspecified viral hepatitis C without hepatic coma: Secondary | ICD-10-CM | POA: Diagnosis present

## 2014-11-27 DIAGNOSIS — K219 Gastro-esophageal reflux disease without esophagitis: Secondary | ICD-10-CM | POA: Diagnosis present

## 2014-11-27 DIAGNOSIS — Z87891 Personal history of nicotine dependence: Secondary | ICD-10-CM

## 2014-11-27 HISTORY — DX: Presence of unspecified artificial knee joint: Z96.659

## 2014-11-27 HISTORY — PX: TOTAL KNEE ARTHROPLASTY: SHX125

## 2014-11-27 LAB — CBC
HCT: 38 % — ABNORMAL LOW (ref 39.0–52.0)
Hemoglobin: 13.1 g/dL (ref 13.0–17.0)
MCH: 29.6 pg (ref 26.0–34.0)
MCHC: 34.5 g/dL (ref 30.0–36.0)
MCV: 86 fL (ref 78.0–100.0)
PLATELETS: 203 10*3/uL (ref 150–400)
RBC: 4.42 MIL/uL (ref 4.22–5.81)
RDW: 13 % (ref 11.5–15.5)
WBC: 14.1 10*3/uL — ABNORMAL HIGH (ref 4.0–10.5)

## 2014-11-27 LAB — CREATININE, SERUM
Creatinine, Ser: 0.68 mg/dL (ref 0.50–1.35)
GFR calc Af Amer: >90 mL/min (ref >90)
GFR calc non Af Amer: >90 mL/min (ref >90)

## 2014-11-27 SURGERY — ARTHROPLASTY, KNEE, TOTAL
Anesthesia: Regional | Site: Knee | Laterality: Left

## 2014-11-27 MED ORDER — CHLORHEXIDINE GLUCONATE 4 % EX LIQD
60.0000 mL | Freq: Once | CUTANEOUS | Status: DC
  Filled 2014-11-27: qty 60

## 2014-11-27 MED ORDER — NEOSTIGMINE METHYLSULFATE 10 MG/10ML IV SOLN
INTRAVENOUS | Status: DC | PRN
  Administered 2014-11-27: 3 mg via INTRAVENOUS

## 2014-11-27 MED ORDER — LEVOTHYROXINE SODIUM 25 MCG PO TABS
225.0000 ug | ORAL_TABLET | Freq: Every day | ORAL | Status: DC
  Administered 2014-11-28 – 2014-11-30 (×3): 225 ug via ORAL
  Filled 2014-11-27 (×4): qty 1

## 2014-11-27 MED ORDER — ONDANSETRON HCL 4 MG PO TABS
4.0000 mg | ORAL_TABLET | Freq: Four times a day (QID) | ORAL | Status: DC | PRN
  Administered 2014-11-28 – 2014-11-30 (×2): 4 mg via ORAL
  Filled 2014-11-27 (×2): qty 1

## 2014-11-27 MED ORDER — POLYETHYLENE GLYCOL 3350 17 G PO PACK: 17.0000 g | PACK | Freq: Every day | ORAL | Status: DC | PRN

## 2014-11-27 MED ORDER — FENTANYL CITRATE 0.05 MG/ML IJ SOLN
50.0000 ug | INTRAMUSCULAR | Status: DC | PRN
  Administered 2014-11-27: 50 ug via INTRAVENOUS

## 2014-11-27 MED ORDER — WARFARIN VIDEO
Freq: Once | Status: AC
  Administered 2014-11-28: 08:00:00

## 2014-11-27 MED ORDER — HYDROMORPHONE HCL 1 MG/ML IJ SOLN: 0.2500 mg | INTRAMUSCULAR | Status: DC | PRN

## 2014-11-27 MED ORDER — ENOXAPARIN SODIUM 30 MG/0.3ML ~~LOC~~ SOLN
30.0000 mg | Freq: Two times a day (BID) | SUBCUTANEOUS | Status: DC
  Administered 2014-11-28 – 2014-11-29 (×3): 30 mg via SUBCUTANEOUS
  Filled 2014-11-27 (×5): qty 0.3

## 2014-11-27 MED ORDER — WARFARIN SODIUM 5 MG PO TABS: 5.0000 mg | ORAL_TABLET | Freq: Every day | ORAL | Status: DC

## 2014-11-27 MED ORDER — ASPIRIN EC 81 MG PO TBEC
81.0000 mg | DELAYED_RELEASE_TABLET | Freq: Every day | ORAL | Status: DC
  Administered 2014-11-28 – 2014-11-30 (×2): 81 mg via ORAL
  Filled 2014-11-27 (×3): qty 1

## 2014-11-27 MED ORDER — OXYCODONE HCL 5 MG PO TABS
5.0000 mg | ORAL_TABLET | ORAL | Status: DC | PRN
  Administered 2014-11-27 (×2): 10 mg via ORAL
  Administered 2014-11-28: 5 mg via ORAL
  Administered 2014-11-28: 10 mg via ORAL
  Administered 2014-11-28: 5 mg via ORAL
  Administered 2014-11-28 (×2): 10 mg via ORAL
  Administered 2014-11-29: 5 mg via ORAL
  Administered 2014-11-29 (×2): 10 mg via ORAL
  Administered 2014-11-29: 5 mg via ORAL
  Administered 2014-11-29: 10 mg via ORAL
  Administered 2014-11-30 (×2): 5 mg via ORAL
  Administered 2014-11-30 (×2): 10 mg via ORAL
  Filled 2014-11-27 (×8): qty 2
  Filled 2014-11-27 (×3): qty 1
  Filled 2014-11-27 (×3): qty 2
  Filled 2014-11-27 (×2): qty 1
  Filled 2014-11-27: qty 2

## 2014-11-27 MED ORDER — PHENOL 1.4 % MT LIQD: 1.0000 | OROMUCOSAL | Status: DC | PRN

## 2014-11-27 MED ORDER — SODIUM CHLORIDE 0.9 % IR SOLN
Status: DC | PRN
  Administered 2014-11-27: 1000 mL

## 2014-11-27 MED ORDER — FENTANYL CITRATE 0.05 MG/ML IJ SOLN
INTRAMUSCULAR | Status: AC
  Filled 2014-11-27: qty 5

## 2014-11-27 MED ORDER — METHOCARBAMOL 500 MG PO TABS: 500.0000 mg | ORAL_TABLET | Freq: Three times a day (TID) | ORAL | Status: DC | PRN

## 2014-11-27 MED ORDER — SODIUM CHLORIDE 0.9 % IR SOLN
Status: DC | PRN
  Administered 2014-11-27: 3000 mL

## 2014-11-27 MED ORDER — BUPIVACAINE-EPINEPHRINE (PF) 0.5% -1:200000 IJ SOLN
INTRAMUSCULAR | Status: DC | PRN
  Administered 2014-11-27: 30 mL via PERINEURAL

## 2014-11-27 MED ORDER — SODIUM CHLORIDE 0.9 % IV SOLN
INTRAVENOUS | Status: DC
  Administered 2014-11-27 – 2014-11-28 (×2): via INTRAVENOUS

## 2014-11-27 MED ORDER — LIDOCAINE HCL (CARDIAC) 20 MG/ML IV SOLN
INTRAVENOUS | Status: DC | PRN
  Administered 2014-11-27: 60 mg via INTRAVENOUS

## 2014-11-27 MED ORDER — VITAMIN C 500 MG PO TABS
1000.0000 mg | ORAL_TABLET | Freq: Every morning | ORAL | Status: DC
  Administered 2014-11-28: 1000 mg via ORAL
  Filled 2014-11-27 (×4): qty 2

## 2014-11-27 MED ORDER — COUMADIN BOOK
Freq: Once | Status: AC
Start: 2014-11-27 — End: 2014-11-27
  Administered 2014-11-27: 19:00:00
  Filled 2014-11-27: qty 1

## 2014-11-27 MED ORDER — ONDANSETRON HCL 4 MG/2ML IJ SOLN: 4.0000 mg | Freq: Four times a day (QID) | INTRAMUSCULAR | Status: DC | PRN

## 2014-11-27 MED ORDER — METHOCARBAMOL 500 MG PO TABS
500.0000 mg | ORAL_TABLET | Freq: Four times a day (QID) | ORAL | Status: DC | PRN
Start: 2014-11-27 — End: 2014-11-30
  Administered 2014-11-27 – 2014-11-30 (×8): 500 mg via ORAL
  Filled 2014-11-27 (×8): qty 1

## 2014-11-27 MED ORDER — ARTIFICIAL TEARS OP OINT
TOPICAL_OINTMENT | OPHTHALMIC | Status: DC | PRN
  Administered 2014-11-27: 1 via OPHTHALMIC

## 2014-11-27 MED ORDER — HYDROCHLOROTHIAZIDE 25 MG PO TABS
25.0000 mg | ORAL_TABLET | Freq: Every day | ORAL | Status: DC
  Administered 2014-11-28 – 2014-11-30 (×3): 25 mg via ORAL
  Filled 2014-11-27 (×5): qty 1

## 2014-11-27 MED ORDER — MIDAZOLAM HCL 5 MG/5ML IJ SOLN
INTRAMUSCULAR | Status: DC | PRN
  Administered 2014-11-27: 1 mg via INTRAVENOUS

## 2014-11-27 MED ORDER — MIDAZOLAM HCL 2 MG/2ML IJ SOLN
INTRAMUSCULAR | Status: AC
  Filled 2014-11-27: qty 2

## 2014-11-27 MED ORDER — METHOCARBAMOL 1000 MG/10ML IJ SOLN
500.0000 mg | Freq: Four times a day (QID) | INTRAVENOUS | Status: DC | PRN
  Filled 2014-11-27: qty 5

## 2014-11-27 MED ORDER — METOCLOPRAMIDE HCL 5 MG/ML IJ SOLN
5.0000 mg | Freq: Three times a day (TID) | INTRAMUSCULAR | Status: DC | PRN
  Administered 2014-11-27: 10 mg via INTRAVENOUS
  Filled 2014-11-27: qty 2

## 2014-11-27 MED ORDER — METOCLOPRAMIDE HCL 5 MG PO TABS
5.0000 mg | ORAL_TABLET | Freq: Three times a day (TID) | ORAL | Status: DC | PRN
  Filled 2014-11-27: qty 2

## 2014-11-27 MED ORDER — WARFARIN - PHARMACIST DOSING INPATIENT: Freq: Every day | Status: DC

## 2014-11-27 MED ORDER — MENTHOL 3 MG MT LOZG: 1.0000 | LOZENGE | OROMUCOSAL | Status: DC | PRN

## 2014-11-27 MED ORDER — FENTANYL CITRATE 0.05 MG/ML IJ SOLN
INTRAMUSCULAR | Status: AC
  Filled 2014-11-27: qty 2

## 2014-11-27 MED ORDER — ONE-DAILY MULTI VITAMINS PO TABS: 1.0000 | ORAL_TABLET | Freq: Every morning | ORAL | Status: DC

## 2014-11-27 MED ORDER — LEVOTHYROXINE SODIUM 200 MCG PO TABS: 200.0000 ug | ORAL_TABLET | Freq: Every day | ORAL | Status: DC

## 2014-11-27 MED ORDER — ONDANSETRON HCL 4 MG/2ML IJ SOLN
INTRAMUSCULAR | Status: DC | PRN
  Administered 2014-11-27: 4 mg via INTRAVENOUS

## 2014-11-27 MED ORDER — LEVOTHYROXINE SODIUM 25 MCG PO TABS: 25.0000 ug | ORAL_TABLET | Freq: Every day | ORAL | Status: DC

## 2014-11-27 MED ORDER — ADULT MULTIVITAMIN W/MINERALS CH
1.0000 | ORAL_TABLET | Freq: Every day | ORAL | Status: DC
  Filled 2014-11-27 (×4): qty 1

## 2014-11-27 MED ORDER — LOSARTAN POTASSIUM 50 MG PO TABS
50.0000 mg | ORAL_TABLET | Freq: Every day | ORAL | Status: DC
  Administered 2014-11-28 – 2014-11-30 (×3): 50 mg via ORAL
  Filled 2014-11-27 (×5): qty 1

## 2014-11-27 MED ORDER — GLYCOPYRROLATE 0.2 MG/ML IJ SOLN
INTRAMUSCULAR | Status: DC | PRN
  Administered 2014-11-27: .4 mg via INTRAVENOUS

## 2014-11-27 MED ORDER — PROPOFOL 10 MG/ML IV BOLUS
INTRAVENOUS | Status: DC | PRN
  Administered 2014-11-27: 30 mg via INTRAVENOUS
  Administered 2014-11-27: 200 mg via INTRAVENOUS

## 2014-11-27 MED ORDER — ATORVASTATIN CALCIUM 40 MG PO TABS
40.0000 mg | ORAL_TABLET | Freq: Every evening | ORAL | Status: DC
  Administered 2014-11-28 – 2014-11-29 (×2): 40 mg via ORAL
  Filled 2014-11-27 (×4): qty 1

## 2014-11-27 MED ORDER — FENTANYL CITRATE 0.05 MG/ML IJ SOLN
INTRAMUSCULAR | Status: DC | PRN
  Administered 2014-11-27 (×9): 50 ug via INTRAVENOUS

## 2014-11-27 MED ORDER — VITAMIN D3 25 MCG (1000 UNIT) PO TABS
1000.0000 [IU] | ORAL_TABLET | Freq: Every morning | ORAL | Status: DC
  Administered 2014-11-28: 1000 [IU] via ORAL
  Filled 2014-11-27 (×4): qty 1

## 2014-11-27 MED ORDER — LACTATED RINGERS IV SOLN
INTRAVENOUS | Status: DC | PRN
  Administered 2014-11-27: 09:00:00 via INTRAVENOUS

## 2014-11-27 MED ORDER — ROCURONIUM BROMIDE 100 MG/10ML IV SOLN
INTRAVENOUS | Status: DC | PRN
  Administered 2014-11-27: 30 mg via INTRAVENOUS

## 2014-11-27 MED ORDER — CEFAZOLIN SODIUM-DEXTROSE 2-3 GM-% IV SOLR
2.0000 g | Freq: Four times a day (QID) | INTRAVENOUS | Status: AC
  Administered 2014-11-27 (×2): 2 g via INTRAVENOUS
  Filled 2014-11-27 (×2): qty 50

## 2014-11-27 MED ORDER — ACETAMINOPHEN 325 MG PO TABS
650.0000 mg | ORAL_TABLET | Freq: Four times a day (QID) | ORAL | Status: DC | PRN
  Administered 2014-11-28 – 2014-11-30 (×6): 650 mg via ORAL
  Filled 2014-11-27 (×6): qty 2

## 2014-11-27 MED ORDER — FERROUS SULFATE 325 (65 FE) MG PO TABS
325.0000 mg | ORAL_TABLET | Freq: Three times a day (TID) | ORAL | Status: DC
  Administered 2014-11-28: 325 mg via ORAL
  Filled 2014-11-27 (×11): qty 1

## 2014-11-27 MED ORDER — PANTOPRAZOLE SODIUM 40 MG PO TBEC
80.0000 mg | DELAYED_RELEASE_TABLET | Freq: Every day | ORAL | Status: DC
  Administered 2014-11-28 – 2014-11-30 (×3): 80 mg via ORAL
  Filled 2014-11-27 (×3): qty 2

## 2014-11-27 MED ORDER — SUCCINYLCHOLINE CHLORIDE 20 MG/ML IJ SOLN
INTRAMUSCULAR | Status: DC | PRN
  Administered 2014-11-27: 140 mg via INTRAVENOUS

## 2014-11-27 MED ORDER — WARFARIN SODIUM 10 MG PO TABS
10.0000 mg | ORAL_TABLET | Freq: Once | ORAL | Status: AC
  Administered 2014-11-27: 10 mg via ORAL
  Filled 2014-11-27: qty 1

## 2014-11-27 MED ORDER — ACETAMINOPHEN 650 MG RE SUPP: 650.0000 mg | Freq: Four times a day (QID) | RECTAL | Status: DC | PRN

## 2014-11-27 MED ORDER — LACTATED RINGERS IV SOLN
INTRAVENOUS | Status: DC
  Administered 2014-11-27: 08:00:00 via INTRAVENOUS

## 2014-11-27 MED ORDER — HYDROMORPHONE HCL 1 MG/ML IJ SOLN
1.0000 mg | INTRAMUSCULAR | Status: DC | PRN
  Administered 2014-11-27: 1 mg via INTRAVENOUS
  Filled 2014-11-27: qty 1

## 2014-11-27 MED ORDER — OXYCODONE-ACETAMINOPHEN 5-325 MG PO TABS: 1.0000 | ORAL_TABLET | ORAL | Status: DC | PRN

## 2014-11-27 MED ORDER — ONDANSETRON HCL 4 MG/2ML IJ SOLN
4.0000 mg | Freq: Four times a day (QID) | INTRAMUSCULAR | Status: DC | PRN
  Administered 2014-11-27 – 2014-11-30 (×3): 4 mg via INTRAVENOUS
  Filled 2014-11-27 (×3): qty 2

## 2014-11-27 MED ORDER — BISACODYL 10 MG RE SUPP: 10.0000 mg | Freq: Every day | RECTAL | Status: DC | PRN

## 2014-11-27 MED ORDER — MIDAZOLAM HCL 2 MG/2ML IJ SOLN
1.0000 mg | INTRAMUSCULAR | Status: DC | PRN
Start: 2014-11-27 — End: 2014-11-27
  Administered 2014-11-27: 1 mg via INTRAVENOUS

## 2014-11-27 SURGICAL SUPPLY — 60 items
BANDAGE ESMARK 6X9 LF (GAUZE/BANDAGES/DRESSINGS) ×1 IMPLANT
BLADE SAG 18X100X1.27 (BLADE) ×2 IMPLANT
BLADE SAW SGTL 13.0X1.19X90.0M (BLADE) ×2 IMPLANT
BNDG CMPR 9X6 STRL LF SNTH (GAUZE/BANDAGES/DRESSINGS) ×1
BNDG CMPR MED 10X6 ELC LF (GAUZE/BANDAGES/DRESSINGS) ×1
BNDG ELASTIC 6X10 VLCR STRL LF (GAUZE/BANDAGES/DRESSINGS) ×2 IMPLANT
BNDG ESMARK 6X9 LF (GAUZE/BANDAGES/DRESSINGS) ×2
BNDG GAUZE ELAST 4 BULKY (GAUZE/BANDAGES/DRESSINGS) ×3 IMPLANT
BOWL SMART MIX CTS (DISPOSABLE) ×2 IMPLANT
CAP KNEE TOTAL 3 ×1 IMPLANT
CAP KNEE TOTAL 3 SIGMA ×1 IMPLANT
CEMENT HV SMART SET (Cement) ×5 IMPLANT
COVER SURGICAL LIGHT HANDLE (MISCELLANEOUS) ×2 IMPLANT
CUFF TOURNIQUET SINGLE 34IN LL (TOURNIQUET CUFF) IMPLANT
CUFF TOURNIQUET SINGLE 44IN (TOURNIQUET CUFF) IMPLANT
DRAPE EXTREMITY T 121X128X90 (DRAPE) ×2 IMPLANT
DRAPE IMP U-DRAPE 54X76 (DRAPES) ×2 IMPLANT
DRAPE PROXIMA HALF (DRAPES) ×2 IMPLANT
DRAPE U-SHAPE 47X51 STRL (DRAPES) ×2 IMPLANT
DRSG ADAPTIC 3X8 NADH LF (GAUZE/BANDAGES/DRESSINGS) ×2 IMPLANT
DRSG PAD ABDOMINAL 8X10 ST (GAUZE/BANDAGES/DRESSINGS) ×2 IMPLANT
DURAPREP 26ML APPLICATOR (WOUND CARE) ×2 IMPLANT
ELECT CAUTERY BLADE 6.4 (BLADE) ×2 IMPLANT
ELECT REM PT RETURN 9FT ADLT (ELECTROSURGICAL) ×2
ELECTRODE REM PT RTRN 9FT ADLT (ELECTROSURGICAL) ×1 IMPLANT
FIXATION PINS ×1 IMPLANT
GAUZE SPONGE 4X4 12PLY STRL (GAUZE/BANDAGES/DRESSINGS) ×2 IMPLANT
GLOVE BIOGEL PI ORTHO PRO 7.5 (GLOVE) ×1
GLOVE BIOGEL PI ORTHO PRO SZ8 (GLOVE) ×1
GLOVE ORTHO TXT STRL SZ7.5 (GLOVE) ×2 IMPLANT
GLOVE PI ORTHO PRO STRL 7.5 (GLOVE) ×1 IMPLANT
GLOVE PI ORTHO PRO STRL SZ8 (GLOVE) ×1 IMPLANT
GLOVE SURG ORTHO 8.5 STRL (GLOVE) ×2 IMPLANT
GOWN STRL REUS W/ TWL XL LVL3 (GOWN DISPOSABLE) ×3 IMPLANT
GOWN STRL REUS W/TWL XL LVL3 (GOWN DISPOSABLE) ×6
HANDPIECE INTERPULSE COAX TIP (DISPOSABLE) ×2
IMMOBILIZER KNEE 22 UNIV (SOFTGOODS) ×1 IMPLANT
KIT BASIN OR (CUSTOM PROCEDURE TRAY) ×2 IMPLANT
KIT MANIFOLD (MISCELLANEOUS) ×1 IMPLANT
KIT ROOM TURNOVER OR (KITS) ×2 IMPLANT
MANIFOLD NEPTUNE II (INSTRUMENTS) ×2 IMPLANT
NS IRRIG 1000ML POUR BTL (IV SOLUTION) ×2 IMPLANT
PACK TOTAL JOINT (CUSTOM PROCEDURE TRAY) ×2 IMPLANT
PACK UNIVERSAL I (CUSTOM PROCEDURE TRAY) ×2 IMPLANT
PAD ABD 8X10 STRL (GAUZE/BANDAGES/DRESSINGS) ×2 IMPLANT
PAD ARMBOARD 7.5X6 YLW CONV (MISCELLANEOUS) ×4 IMPLANT
SET HNDPC FAN SPRY TIP SCT (DISPOSABLE) ×1 IMPLANT
STRIP CLOSURE SKIN 1/2X4 (GAUZE/BANDAGES/DRESSINGS) ×4 IMPLANT
SUCTION FRAZIER TIP 10 FR DISP (SUCTIONS) ×2 IMPLANT
SUT MNCRL AB 3-0 PS2 18 (SUTURE) ×2 IMPLANT
SUT VIC AB 0 CT1 27 (SUTURE) ×4
SUT VIC AB 0 CT1 27XBRD ANBCTR (SUTURE) ×2 IMPLANT
SUT VIC AB 1 CT1 27 (SUTURE) ×6
SUT VIC AB 1 CT1 27XBRD ANBCTR (SUTURE) ×3 IMPLANT
SUT VIC AB 2-0 CT1 27 (SUTURE) ×4
SUT VIC AB 2-0 CT1 TAPERPNT 27 (SUTURE) ×2 IMPLANT
TOWEL OR 17X24 6PK STRL BLUE (TOWEL DISPOSABLE) ×2 IMPLANT
TOWEL OR 17X26 10 PK STRL BLUE (TOWEL DISPOSABLE) ×2 IMPLANT
TRAY FOLEY CATH 16FRSI W/METER (SET/KITS/TRAYS/PACK) ×1 IMPLANT
WATER STERILE IRR 1000ML POUR (IV SOLUTION) ×2 IMPLANT

## 2014-11-27 NOTE — Transfer of Care (Signed)
Immediate Anesthesia Transfer of Care Note  Patient: Samuel Goodwin  Procedure(s) Performed: Procedure(s): LEFT TOTAL KNEE ARTHROPLASTY (Left)  Patient Location: PACU  Anesthesia Type:General  Level of Consciousness: awake, alert  and oriented  Airway & Oxygen Therapy: Patient Spontanous Breathing and Patient connected to nasal cannula oxygen  Post-op Assessment: Report given to PACU RN and Post -op Vital signs reviewed and stable  Post vital signs: Reviewed and stable  Complications: No apparent anesthesia complications

## 2014-11-27 NOTE — Interval H&P Note (Signed)
History and Physical Interval Note:  11/27/2014 8:30 AM  Samuel Goodwin  has presented today for surgery, with the diagnosis of left knee osteoarthritis  The various methods of treatment have been discussed with the patient and family. After consideration of risks, benefits and other options for treatment, the patient has consented to  Procedure(s): LEFT TOTAL KNEE ARTHROPLASTY (Left) as a surgical intervention .  The patient's history has been reviewed, patient examined, no change in status, stable for surgery.  I have reviewed the patient's chart and labs.  Questions were answered to the patient's satisfaction.     Samuel Goodwin,STEVEN R

## 2014-11-27 NOTE — Progress Notes (Signed)
WC CM is Patrick Jupiter [phone] claim # [id number]  11/27/14 Spoke with patient and his wife about Worker's Comp and d/c plan. Discharge plan is to go to [facility] on Monday for rehab.Contacted Aaron Edelman and informed him of plan for [place], faxed H and P and op note, received confirmation. CM and CSW will continue to follow. Fuller Plan RN, BSN, CCM

## 2014-11-27 NOTE — Anesthesia Procedure Notes (Addendum)
Anesthesia Regional Block:  Femoral nerve block  Pre-Anesthetic Checklist: ,, timeout performed, Correct Patient, Correct Site, Correct Laterality, Correct Procedure,, site marked, risks and benefits discussed, Surgical consent,  Pre-op evaluation,  At surgeon's request and post-op pain management  Laterality: Left  Prep: chloraprep       Needles:  Injection technique: Single-shot  Needle Type: Echogenic Stimulator Needle     Needle Length: 9cm 9 cm Needle Gauge: 21 and 21 G    Additional Needles:  Procedures: nerve stimulator Femoral nerve block  Nerve Stimulator or Paresthesia:  Response: Quadriceps muscle contraction, 0.45 mA,   Additional Responses:   Narrative:  Start time: 11/27/2014 8:53 AM End time: 11/27/2014 9:03 AM Injection made incrementally with aspirations every 5 mL.  Performed by: Personally   Additional Notes: Functioning IV was confirmed and monitors were applied.  A 87mm 21ga Arrow echogenic stimulator needle was used. Sterile prep and drape,hand hygiene and sterile gloves were used.  Negative aspiration and negative test dose prior to incremental administration of local anesthetic. The patient tolerated the procedure well.     Procedure Name: Intubation Date/Time: 11/27/2014 10:14 AM Performed by: Garner Nash Pre-anesthesia Checklist: Patient identified, Timeout performed, Emergency Drugs available, Suction available and Patient being monitored Patient Re-evaluated:Patient Re-evaluated prior to inductionOxygen Delivery Method: Circle system utilized Preoxygenation: Pre-oxygenation with 100% oxygen Intubation Type: IV induction Grade View: Grade I Tube size: 7.5 mm Number of attempts: 1 Airway Equipment and Method: Stylet and Video-laryngoscopy Placement Confirmation: ETT inserted through vocal cords under direct vision,  positive ETCO2,  CO2 detector and breath sounds checked- equal and bilateral Secured at: 21 cm Tube secured with:  Tape Dental Injury: Teeth and Oropharynx as per pre-operative assessment  Comments: Elective glidescope- limited mouth opening, large neck circumference, limited TMD

## 2014-11-27 NOTE — Progress Notes (Signed)
ANTICOAGULATION CONSULT NOTE - Initial Consult  Pharmacy Consult for coumadin Indication: VTE prophylaxis  Allergies  Allergen Reactions  . Benazepril Hcl Cough    Patient Measurements: Height: 6' (182.9 cm) Weight: 288 lb 1.6 oz (130.681 kg) IBW/kg (Calculated) : 77.6  Vital Signs: Temp: 98.1 F (36.7 C) (12/18 1135) Temp Source: Oral (12/18 0735) BP: 167/88 mmHg (12/18 1200) Pulse Rate: 86 (12/18 1300)  Labs: No results for input(s): HGB, HCT, PLT, APTT, LABPROT, INR, HEPARINUNFRC, CREATININE, CKTOTAL, CKMB, TROPONINI in the last 72 hours.  Estimated Creatinine Clearance: 149.2 mL/min (by C-G formula based on Cr of 0.71).   Medical History: Past Medical History  Diagnosis Date  . Hyperlipidemia   . Hypertension   . Hypothyroidism   . GERD (gastroesophageal reflux disease)   . Alcohol abuse     Sobriety since 1990  . Benign positional vertigo   . OSA (obstructive sleep apnea)     on CPAP  . Anxiety   . Depression   . PTSD (post-traumatic stress disorder)   . Arthritis   . Hepatitis C 1994    hep c - ? etiology; in [place] 1989-91; S/P interferon , Dr [name]    Medications:  Prescriptions prior to admission  Medication Sig Dispense Refill Last Dose  . ascorbic acid (VITAMIN C) 1000 MG tablet Take 1,000 mg by mouth every morning.   Past Month at Unknown time  . aspirin EC 81 MG tablet Take 81 mg by mouth daily.   Past Month at Unknown time  . atorvastatin (LIPITOR) 40 MG tablet Take 40 mg by mouth every evening.    Past Week at Unknown time  . Cholecalciferol (VITAMIN D PO) Take 2 capsules by mouth every morning.   Past Month at Unknown time  . hydrochlorothiazide (HYDRODIURIL) 25 MG tablet Take 1 tablet (25 mg total) by mouth daily. (Patient taking differently: Take 25 mg by mouth every morning. ) 90 tablet 3 11/26/2014 at Unknown time  . ibuprofen (ADVIL,MOTRIN) 200 MG tablet Take 800 mg by mouth daily as needed for mild pain.   Past Month at Unknown time  .  levothyroxine (LEVOTHROID) 25 MCG tablet Take 25 mcg by mouth daily before breakfast.   11/27/2014 at 0615  . levothyroxine (SYNTHROID, LEVOTHROID) 200 MCG tablet Take 200 mcg by mouth daily before breakfast.   11/27/2014 at 0615  . losartan (COZAAR) 50 MG tablet Take 50 mg by mouth daily.   11/26/2014 at Unknown time  . Multiple Vitamin (MULTIVITAMIN) tablet Take 1 tablet by mouth every morning.    Past Month at Unknown time  . naproxen (NAPROSYN) 500 MG tablet Take 1 tablet (500 mg total) by mouth 2 (two) times daily. 60 tablet 0 Past Month at Unknown time  . omeprazole (PRILOSEC) 20 MG capsule Take 20 mg by mouth 2 (two) times daily before a meal.    11/27/2014 at 0615    Assessment: 53 yo man to start coumadin for VTE px s/p L TKA.  He is also on lovenox 30 mg sq q12 hours which will stop when INR >1.8.  Baseline labs normal. Goal of Therapy:  INR 2-3 Monitor platelets by anticoagulation protocol: Yes   Plan:  Coumadin 10 mg po tonight. Daily PT/INR. F/u education  Thanks for allowing pharmacy to be a part of this patient's care.  Excell Seltzer, PharmD Clinical Pharmacist, [phone]  11/27/2014,1:23 PM

## 2014-11-27 NOTE — Progress Notes (Signed)
Orthopedic Tech Progress Note Patient Details:  Samuel Goodwin 1961-10-23 [id number]  Ortho Devices Type of Ortho Device:  (footsie roll) Ortho Device/Splint Interventions: Application   Hildred Priest 11/27/2014, 2:37 PM

## 2014-11-27 NOTE — Progress Notes (Signed)
Utilization review completed.  

## 2014-11-27 NOTE — Anesthesia Preprocedure Evaluation (Signed)
Anesthesia Evaluation  Patient identified by MRN, date of birth, ID band Patient awake    Reviewed: Allergy & Precautions, H&P , NPO status , Patient's Chart, lab work & pertinent test results  Airway Mallampati: II   Neck ROM: full    Dental   Pulmonary sleep apnea , former smoker,          Cardiovascular hypertension,     Neuro/Psych Anxiety Depression    GI/Hepatic GERD-  ,(+) Hepatitis -, C  Endo/Other  Hypothyroidism Morbid obesity  Renal/GU      Musculoskeletal  (+) Arthritis -,   Abdominal   Peds  Hematology   Anesthesia Other Findings   Reproductive/Obstetrics                             Anesthesia Physical Anesthesia Plan  ASA: II  Anesthesia Plan: General and Regional   Post-op Pain Management: MAC Combined w/ Regional for Post-op pain   Induction: Intravenous  Airway Management Planned: Oral ETT  Additional Equipment:   Intra-op Plan:   Post-operative Plan: Extubation in OR  Informed Consent: I have reviewed the patients History and Physical, chart, labs and discussed the procedure including the risks, benefits and alternatives for the proposed anesthesia with the patient or authorized representative who has indicated his/her understanding and acceptance.     Plan Discussed with: CRNA, Anesthesiologist and Surgeon  Anesthesia Plan Comments:         Anesthesia Quick Evaluation

## 2014-11-27 NOTE — Evaluation (Signed)
Physical Therapy Evaluation Patient Details Name: Samuel Goodwin MRN: [id number] DOB: Jul 17, 1961 Today's Date: 11/27/2014   History of Present Illness  53 yo male s/p L TKA with KI WBAT PMH: Hep C, HLD, HTN, GERD< alcoholism sober since 1990, benign positional vertigo, OSA, throat surg x3   Clinical Impression  Patient presents with decreased independence with mobility due to deficits listed in PT problem list.  He will benefit from skilled PT in the acute setting to address issues and assist with return home with family assist after SNF rehab stay.  Patient with difficulty managing at home due to set up with last surgery so will plan to go to SNF briefly.    Follow Up Recommendations SNF    Equipment Recommendations  Rolling walker with 5" wheels    Recommendations for Other Services       Precautions / Restrictions Precautions Precautions: Knee Required Braces or Orthoses: Knee Immobilizer - Left Restrictions Weight Bearing Restrictions: No LLE Weight Bearing: Weight bearing as tolerated Other Position/Activity Restrictions: wbat      Mobility  Bed Mobility Overal bed mobility: Needs Assistance Bed Mobility: Supine to Sit     Supine to sit: Min assist;HOB elevated     General bed mobility comments: assist for technique and left LE  Transfers Overall transfer level: Needs assistance Equipment used: Rolling walker (2 wheeled) Transfers: Sit to/from [facility] Sit to Stand: Min assist Stand pivot transfers: Min assist       General transfer comment: cues for hand placement and for technique; assist wtih walker to step around to chair due to left LE buckling in knee brace did not ambulate  Ambulation/Gait                Stairs            Wheelchair Mobility    Modified Rankin (Stroke Patients Only)       Balance Overall balance assessment: Needs assistance         Standing balance support: Bilateral upper extremity  supported Standing balance-Leahy Scale: Poor Standing balance comment: reports balance issues premorbid due to right knee and hip issues (also ?BPV)                             Pertinent Vitals/Pain Pain Assessment: 0-10 Pain Score: 8  Pain Location: left knee Pain Intervention(s): Monitored during session    Home Living Family/patient expects to be discharged to:: Skilled nursing facility Living Arrangements: Spouse/significant other   Type of Home: House Home Access: Stairs to enter Entrance Stairs-Rails: Chemical engineer of Steps: 5 Home Layout: Two level;Able to live on main level with bedroom/bathroom Home Equipment: Kasandra Knudsen - single point Additional Comments: states bathroom door is narrow and hard to sit on toilet and close door    Prior Function Level of Independence: Independent with assistive device(s)         Comments: using cane since July; 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[place] [facility]     Hand Dominance        Extremity/Trunk Assessment               Lower Extremity Assessment: LLE deficits/detail   LLE Deficits / Details: ankle AROM WFL, strength knee extension at least 2+/5, AROM grossly to 35 flexion     Communication   Communication: No difficulties  Cognition Arousal/Alertness: Awake/alert Behavior During Therapy: WFL for tasks assessed/performed Overall Cognitive Status: Within Functional  Limits for tasks assessed                      General Comments      Exercises Total Joint Exercises Ankle Circles/Pumps: AROM;5 reps;Both;Supine Quad Sets: AROM;Left;5 reps;Supine Heel Slides: AAROM;Left;5 reps;Supine Hip ABduction/ADduction: AAROM;Left;5 reps;Supine      Assessment/Plan    PT Assessment Patient needs continued PT services  PT Diagnosis Difficulty walking;Acute pain   PT Problem List Decreased strength;Decreased range of motion;Decreased activity tolerance;Decreased balance;Decreased  knowledge of use of DME;Pain;Decreased mobility  PT Treatment Interventions Functional mobility training;Therapeutic activities;Patient/family education;Gait training;DME instruction;Therapeutic exercise;Balance training;Stair training   PT Goals (Current goals can be found in the Care Plan section) Acute Rehab PT Goals Patient Stated Goal: To go to rehab prior to d/c home PT Goal Formulation: With patient/family Time For Goal Achievement: 12/02/14 Potential to Achieve Goals: Good    Frequency 7X/week   Barriers to discharge Inaccessible home environment bathroom door too narrow for walker and cannot get to toilet with door in the way    Co-evaluation               End of Session Equipment Utilized During Treatment: Gait belt;Left knee immobilizer Activity Tolerance: Patient tolerated treatment well Patient left: in chair;with call bell/phone within reach;with family/visitor present           Time: 6389-3734 PT Time Calculation (min) (ACUTE ONLY): 38 min   Charges:   PT Evaluation $Initial PT Evaluation Tier I: 1 Procedure PT Treatments $Therapeutic Exercise: 8-22 mins $Therapeutic Activity: 8-22 mins   PT G Codes:          Caliana Spires,CYNDI 11/28/2014, 4:37 PM Magda Kiel, [place] 11-28-2014

## 2014-11-27 NOTE — Brief Op Note (Signed)
11/27/2014  11:35 AM  PATIENT:  Samuel Goodwin  53 y.o. male  PRE-OPERATIVE DIAGNOSIS:  left knee osteoarthritis, end stage  POST-OPERATIVE DIAGNOSIS:  left knee osteoarthritis, end stage  PROCEDURE:  Procedure(s): LEFT TOTAL KNEE ARTHROPLASTY (Left), DePuy Sigma RP  SURGEON:  Surgeon(s) and Role:    * Augustin Schooling, MD - Primary  PHYSICIAN ASSISTANT:   ASSISTANTS: Ventura Bruns, PA-C   ANESTHESIA:   regional and general  EBL:  Total I/O In: 1600 [I.V.:1600] Out: 175 [Urine:100; Blood:75]  BLOOD ADMINISTERED:none  DRAINS: none   LOCAL MEDICATIONS USED:  NONE  SPECIMEN:  No Specimen  DISPOSITION OF SPECIMEN:  N/A  COUNTS:  YES  TOURNIQUET:   Total Tourniquet Time Documented: Thigh (Left) - 123 minutes Total: Thigh (Left) - 123 minutes   DICTATION: .Other Dictation: Dictation Number [id number]  PLAN OF CARE: Admit to inpatient   PATIENT DISPOSITION:  PACU - hemodynamically stable.   Delay start of Pharmacological VTE agent (>24hrs) due to surgical blood loss or risk of bleeding: no

## 2014-11-28 LAB — BASIC METABOLIC PANEL
ANION GAP: 10 (ref 5–15)
BUN: 9 mg/dL (ref 6–23)
CALCIUM: 8.2 mg/dL — AB (ref 8.4–10.5)
CO2: 30 mEq/L (ref 19–32)
Chloride: 99 mEq/L (ref 96–112)
Creatinine, Ser: 0.83 mg/dL (ref 0.50–1.35)
GFR calc Af Amer: >90 mL/min (ref >90)
GFR calc non Af Amer: >90 mL/min (ref >90)
Glucose, Bld: 132 mg/dL — ABNORMAL HIGH (ref 70–99)
Potassium: 3.5 mEq/L — ABNORMAL LOW (ref 3.7–5.3)
Sodium: 139 mEq/L (ref 137–147)

## 2014-11-28 LAB — PROTIME-INR
INR: 1.34 (ref 0.00–1.49)
Prothrombin Time: 16.7 seconds — ABNORMAL HIGH (ref 11.6–15.2)

## 2014-11-28 LAB — CBC
HCT: 37.3 % — ABNORMAL LOW (ref 39.0–52.0)
Hemoglobin: 12.1 g/dL — ABNORMAL LOW (ref 13.0–17.0)
MCH: 28.7 pg (ref 26.0–34.0)
MCHC: 32.4 g/dL (ref 30.0–36.0)
MCV: 88.6 fL (ref 78.0–100.0)
PLATELETS: 206 10*3/uL (ref 150–400)
RBC: 4.21 MIL/uL — ABNORMAL LOW (ref 4.22–5.81)
RDW: 13.4 % (ref 11.5–15.5)
WBC: 10.3 10*3/uL (ref 4.0–10.5)

## 2014-11-28 MED ORDER — WARFARIN SODIUM 10 MG PO TABS
10.0000 mg | ORAL_TABLET | Freq: Once | ORAL | Status: AC
  Administered 2014-11-28: 10 mg via ORAL
  Filled 2014-11-28: qty 1

## 2014-11-28 MED ORDER — ACETAMINOPHEN 10 MG/ML IV SOLN
1000.0000 mg | Freq: Two times a day (BID) | INTRAVENOUS | Status: AC
  Administered 2014-11-28 – 2014-11-29 (×2): 1000 mg via INTRAVENOUS
  Filled 2014-11-28 (×2): qty 100

## 2014-11-28 NOTE — Progress Notes (Signed)
Orthopedics Progress Note  Subjective: Patient feels better this morning.  Had nausea with some pain meds yesterday.    Objective:  Filed Vitals:   11/28/14 0638  BP: 147/92  Pulse:   Temp:   Resp:     General: Awake and alert  Musculoskeletal: Left knee dressing intact. No drainage.  In CPM, no pain Neurovascularly intact  Lab Results  Component Value Date   WBC 10.3 11/28/2014   HGB 12.1* 11/28/2014   HCT 37.3* 11/28/2014   MCV 88.6 11/28/2014   PLT 206 11/28/2014       Component Value Date/Time   NA 139 11/28/2014 0530   K 3.5* 11/28/2014 0530   CL 99 11/28/2014 0530   CO2 30 11/28/2014 0530   GLUCOSE 132* 11/28/2014 0530   BUN 9 11/28/2014 0530   CREATININE 0.83 11/28/2014 0530   CALCIUM 8.2* 11/28/2014 0530   GFRNONAA >90 11/28/2014 0530   GFRAA >90 11/28/2014 0530    Lab Results  Component Value Date   INR 1.34 11/28/2014   INR 1.00 11/19/2014    Assessment/Plan: POD #1 s/p Procedure(s): LEFT TOTAL KNEE ARTHROPLASTY DVT prophylaxis, pulm toilet Mobilization, OOB PT, OT  D/C planning -- [facility] hopefully Monday  Doran Heater. [name], MD 11/28/2014 7:39 AM

## 2014-11-28 NOTE — Progress Notes (Signed)
[facility] for coumadin Indication: VTE prophylaxis  Allergies  Allergen Reactions  . Benazepril Hcl Cough    Patient Measurements: Height: 6' (182.9 cm) Weight: 288 lb 1.6 oz (130.681 kg) IBW/kg (Calculated) : 77.6  Vital Signs: Temp: 98.7 F (37.1 C) (12/19 0547) Temp Source: Axillary (12/19 0547) BP: 129/86 mmHg (12/19 0826) Pulse Rate: 98 (12/19 0547)  Labs:  Recent Labs  11/27/14 1429 11/28/14 0530  HGB 13.1 12.1*  HCT 38.0* 37.3*  PLT 203 206  LABPROT  --  16.7*  INR  --  1.34  CREATININE 0.68 0.83    Estimated Creatinine Clearance: 143.8 mL/min (by C-G formula based on Cr of 0.83).   Medical History: Past Medical History  Diagnosis Date  . Hyperlipidemia   . Hypertension   . Hypothyroidism   . GERD (gastroesophageal reflux disease)   . Alcohol abuse     Sobriety since 1990  . Benign positional vertigo   . OSA (obstructive sleep apnea)     on CPAP  . Anxiety   . Depression   . PTSD (post-traumatic stress disorder)   . Arthritis   . Hepatitis C 1994    hep c - ? etiology; in [place] 1989-91; S/P interferon , Dr [name]    Medications:  Prescriptions prior to admission  Medication Sig Dispense Refill Last Dose  . ascorbic acid (VITAMIN C) 1000 MG tablet Take 1,000 mg by mouth every morning.   Past Month at Unknown time  . aspirin EC 81 MG tablet Take 81 mg by mouth daily.   Past Month at Unknown time  . atorvastatin (LIPITOR) 40 MG tablet Take 40 mg by mouth every evening.    Past Week at Unknown time  . Cholecalciferol (VITAMIN D PO) Take 2 capsules by mouth every morning.   Past Month at Unknown time  . hydrochlorothiazide (HYDRODIURIL) 25 MG tablet Take 1 tablet (25 mg total) by mouth daily. (Patient taking differently: Take 25 mg by mouth every morning. ) 90 tablet 3 11/26/2014 at Unknown time  . ibuprofen (ADVIL,MOTRIN) 200 MG tablet Take 800 mg by mouth daily as needed for mild pain.   Past Month at Unknown  time  . levothyroxine (LEVOTHROID) 25 MCG tablet Take 25 mcg by mouth daily before breakfast.   11/27/2014 at 0615  . levothyroxine (SYNTHROID, LEVOTHROID) 200 MCG tablet Take 200 mcg by mouth daily before breakfast.   11/27/2014 at 0615  . losartan (COZAAR) 50 MG tablet Take 50 mg by mouth daily.   11/26/2014 at Unknown time  . Multiple Vitamin (MULTIVITAMIN) tablet Take 1 tablet by mouth every morning.    Past Month at Unknown time  . naproxen (NAPROSYN) 500 MG tablet Take 1 tablet (500 mg total) by mouth 2 (two) times daily. 60 tablet 0 Past Month at Unknown time  . omeprazole (PRILOSEC) 20 MG capsule Take 20 mg by mouth 2 (two) times daily before a meal.    11/27/2014 at 0615    Assessment: 53 yo man continuing coumadin for VTE px s/p L TKA. He is also on lovenox 30 mg sq q12 hours which will stop when INR >1.8. INR subtherapeutic this AM 1.34. CBC ok. No bleeding documented.  Goal of Therapy:  INR 2-3 Monitor platelets by anticoagulation protocol: Yes   Plan:  -Coumadin 10 mg po today -Daily PT/INR -Coumadin book/video ordered, f/u education  Elicia Lamp, PharmD Clinical Pharmacist - Resident Pager [phone] 11/28/2014 11:19 AM

## 2014-11-28 NOTE — Progress Notes (Signed)
Physical Therapy Treatment Patient Details Name: Samuel Goodwin MRN: [id number] DOB: Apr 20, 1961 Today's Date: 11/28/2014    History of Present Illness 53 yo male s/p L TKA with KI WBAT PMH: Hep C, HLD, HTN, GERD< alcoholism sober since 1990, benign positional vertigo, OSA, throat surg x3     PT Comments    Pt limited this morning by nausea, but is moving well and still able to participate with abreviated therapy session.  Pt would benefit from continued acute therapy to progress mobility, gait, and exercises as able.    Follow Up Recommendations  SNF     Equipment Recommendations  Rolling walker with 5" wheels    Recommendations for Other Services   NA     Precautions / Restrictions Precautions Precautions: Knee Required Braces or Orthoses: Knee Immobilizer - Left Knee Immobilizer - Left: Other (comment) (until d/c'd by MD) Restrictions Weight Bearing Restrictions: Yes LLE Weight Bearing: Weight bearing as tolerated    Mobility  Bed Mobility Overal bed mobility: Needs Assistance Bed Mobility: Supine to Sit     Supine to sit: Min assist     General bed mobility comments: Min assist to help support leg to get to sitting EOB.    Transfers Overall transfer level: Needs assistance Equipment used: Rolling walker (2 wheeled) Transfers: Sit to/from Stand Sit to Stand: Min assist         General transfer comment: Min assist to support trunk during transitions from sit to stand and back to sitting again at end of gait.  Verbal cues for safe hand placement and some assist to stabilize RW during transition of hands.   Ambulation/Gait Ambulation/Gait assistance: Min guard Ambulation Distance (Feet): 20 Feet Assistive device: Rolling walker (2 wheeled) Gait Pattern/deviations: Step-to pattern;Antalgic Gait velocity: decrased   General Gait Details: Pt with moderately antalgic gait pattern.  Pt limited today by nausea.  Chair to follow to encouarge increased gait  distance and in case he needed to sit down to vomit.           Balance Overall balance assessment: Needs assistance Sitting-balance support: Feet supported;No upper extremity supported Sitting balance-Leahy Scale: Good     Standing balance support: Bilateral upper extremity supported Standing balance-Leahy Scale: Fair                      Cognition Arousal/Alertness: Awake/alert Behavior During Therapy: WFL for tasks assessed/performed Overall Cognitive Status: Within Functional Limits for tasks assessed                             Pertinent Vitals/Pain Pain Assessment: 0-10 Pain Score: 5  Pain Location: knee, left Pain Descriptors / Indicators: Burning;Aching Pain Intervention(s): Limited activity within patient's tolerance;Monitored during session;Repositioned           PT Goals (current goals can now be found in the care plan section) Acute Rehab PT Goals Patient Stated Goal: To go to rehab prior to d/c home Progress towards PT goals: Progressing toward goals    Frequency  7X/week    PT Plan Current plan remains appropriate       End of Session Equipment Utilized During Treatment: Left knee immobilizer Activity Tolerance: Patient limited by pain;Other (comment) (limited by nausea) Patient left: in chair;with call bell/phone within reach;with family/visitor present     Time: 0881-1031 PT Time Calculation (min) (ACUTE ONLY): 22 min  Charges:  $Gait Training: 8-22 mins  Barbarann Ehlers Dyer Klug, [place], DPT [phone]   11/28/2014, 10:34 AM

## 2014-11-28 NOTE — Progress Notes (Signed)
Subjective: 1 Day Post-Op Procedure(s) (LRB): LEFT TOTAL KNEE ARTHROPLASTY (Left) Patient reports pain as moderate to left knee.  Nausea reported after ambulation. Limited diet this am. Wife at bedside. Denies calf pain, Sob, CP.   Objective: Vital signs in last 24 hours: Temp:  [97.6 F (36.4 C)-99.5 F (37.5 C)] 98.7 F (37.1 C) (12/19 0547) Pulse Rate:  [52-98] 98 (12/19 0547) Resp:  [12-21] 18 (12/19 0800) BP: (118-167)/(70-137) 129/86 mmHg (12/19 0826) SpO2:  [93 %-100 %] 97 % (12/19 0547)  Intake/Output from previous day: 12/18 0701 - 12/19 0700 In: 2950 [P.O.:840; I.V.:2110] Out: 1000 [Urine:925; Blood:75] Intake/Output this shift:     Recent Labs  11/27/14 1429 11/28/14 0530  HGB 13.1 12.1*    Recent Labs  11/27/14 1429 11/28/14 0530  WBC 14.1* 10.3  RBC 4.42 4.21*  HCT 38.0* 37.3*  PLT 203 206    Recent Labs  11/27/14 1429 11/28/14 0530  NA  --  139  K  --  3.5*  CL  --  99  CO2  --  30  BUN  --  9  CREATININE 0.68 0.83  GLUCOSE  --  132*  CALCIUM  --  8.2*    Recent Labs  11/28/14 0530  INR 1.34    Alert and oriented x3. RRR, Lungs clear, BS x4. Left Calf soft and non tender. L knee dressing C/D/I. No DVT signs. No signs of infection or compartment syndrome. LLE neurovascularly intact.   Assessment/Plan: 1 Day Post-Op Procedure(s) (LRB): LEFT TOTAL KNEE ARTHROPLASTY (Left) Up with PT Change dressing tomorrow Continue current care Modify pain meds and continue PRN antiemetics.   Samuel Goodwin L 11/28/2014, 10:10 AM

## 2014-11-28 NOTE — Progress Notes (Signed)
Pt's BP running on high side all last evening and throughout night. Would go down a little with pain meds. Pt did not take BP and HCZ 12/18 d/t surgery. He usually takes at 0530 daily, so requested pharmacy to change med time on 12/19 from 1000 to 0530.  Synthroid due at 0800. Going to give at 0630 on empty stomach and prior to breakfast.

## 2014-11-28 NOTE — Progress Notes (Signed)
Occupational Therapy Evaluation Patient Details Name: Samuel Goodwin MRN: [id number] DOB: November 09, 1961 Today's Date: 11/28/2014    History of Present Illness 53 yo male s/p L TKA with KI WBAT PMH: Hep C, HLD, HTN, GERD< alcoholism sober since 1990, benign positional vertigo, OSA, throat surg x3    Clinical Impression   PTA, pt mod I with mobility and ADL. Pt requires Min A for limited mobility @ RW level and mod A for LB ADL. Pt will benefit from rehab at SNF to facilitate safe D/C home. Will follow acutely to facilitate D/C to SNF.     Follow Up Recommendations  SNF;Supervision/Assistance - 24 hour    Equipment Recommendations  Tub/shower bench;3 in 1 bedside comode    Recommendations for Other Services       Precautions / Restrictions Precautions Precautions: Knee Required Braces or Orthoses: Knee Immobilizer - Left Knee Immobilizer - Left: Other (comment) (until d/c'd by MD) Restrictions Weight Bearing Restrictions: Yes LLE Weight Bearing: Weight bearing as tolerated      Mobility Bed Mobility Overal bed mobility: Needs Assistance Bed Mobility: Supine to Sit     Supine to sit: Min assist     General bed mobility comments: Used back precuations to minimize back pain. Encouraged pt to roll to side and mobilize in bed in sidelying insteady of arching back.  Transfers Overall transfer level: Needs assistance Equipment used: Rolling walker (2 wheeled) Transfers: Sit to/from Stand Sit to Stand: Min assist         General transfer comment: Min assist to support trunk during transitions from sit to stand and back to sitting again at end of gait.  Verbal cues for safe hand placement and some assist to stabilize RW during transition of hands.     Balance Overall balance assessment: Needs assistance Sitting-balance support: Feet supported;No upper extremity supported Sitting balance-Leahy Scale: Good     Standing balance support: Bilateral upper extremity  supported Standing balance-Leahy Scale: Fair                              ADL Overall ADL's : Needs assistance/impaired     Grooming: Set up   Upper Body Bathing: Set up;Sitting   Lower Body Bathing: Moderate assistance;Sit to/from stand   Upper Body Dressing : Set up;Sitting   Lower Body Dressing: Moderate assistance;Sit to/from stand   Toilet Transfer: Minimal assistance;Stand-pivot   Toileting- Clothing Manipulation and Hygiene: Set up [place] Details (indicate cue type and reason): painful due to back     Functional mobility during ADLs: Minimal assistance;Cueing for safety;Cueing for sequencing;Rolling walker (increased pain) General ADL Comments: Also limited by back  pain. will most likely benefit from AE for LB ADL     Vision                     Perception     Praxis      Pertinent Vitals/Pain Pain Assessment: 0-10 Pain Score: 5  Pain Location: L knee/back Pain Descriptors / Indicators: Aching Pain Intervention(s): Limited activity within patient's tolerance;Monitored during session;Repositioned     Hand Dominance Right   Extremity/Trunk Assessment Upper Extremity Assessment Upper Extremity Assessment: Overall WFL for tasks assessed   Lower Extremity Assessment Lower Extremity Assessment: Defer to PT evaluation   Cervical / Trunk Assessment Cervical / Trunk Assessment: Normal (reports "buldging" disc)   Communication     Cognition Arousal/Alertness: Awake/alert Behavior During  Therapy: WFL for tasks assessed/performed Overall Cognitive Status: Within Functional Limits for tasks assessed                     General Comments       Exercises Exercises:  (deferred until PM session due to nausea. 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[place] [facility]    OT Diagnosis: Generalized weakness;Acute pain   OT Problem List: Decreased strength;Decreased range of motion;Decreased activity tolerance;Decreased knowledge of use of DME or AE;Obesity;Pain   OT Treatment/Interventions: Self-care/ADL training;DME and/or AE instruction;Therapeutic activities;Patient/family education    OT Goals(Current goals can be found in the care plan section) Acute Rehab OT Goals Patient Stated Goal: To go to rehab prior to d/c home OT Goal Formulation: With patient Time For Goal Achievement: 12/12/14 Potential to Achieve Goals: Good  OT Frequency: Min 2X/week   Barriers to D/C:  (bathrooms not accessible)          Co-evaluation              End of Session Equipment Utilized During Treatment: Gait belt;Rolling walker;Left knee immobilizer CPM Left Knee CPM Left Knee: Off Nurse Communication: Mobility status  Activity Tolerance: Patient tolerated treatment well Patient left: in bed;with call bell/phone within reach   Time: 1105-1125 OT Time Calculation (min): 20 min Charges:  OT General Charges $OT Visit: 1 Procedure OT Evaluation $Initial OT Evaluation Tier I: 1 Procedure OT Treatments $Self Care/Home Management : 8-22 mins G-Codes:    Ervin Rothbauer,HILLARY 2014-12-25, 11:40 AM   Maurie Boettcher, OTR/L  [phone] 2014/12/25

## 2014-11-28 NOTE — Discharge Instructions (Signed)
Ice the knee constantly.  Do not prop anything behind the knee.  Prop under the ankle or the calf.  Make sure to wear the TED hose at all times to prevent blood clots.  CPM for SNF and home for one month... 6-8 hours per day in two hour increments 0-60 degrees, increase 10 degrees per day.  Patient has looked at [facility] for post hospital placement  Do exercises every hour while awake.. Calf pumps, heel slides,  Quad sets  Dangle over the bed to 90 degrees whenever you can.  Follow up with Dr [name] in two weeks in the office.  Call [phone] for appointment  Information on my medicine - Coumadin   (Warfarin)  This medication education was reviewed with me or my healthcare representative as part of my discharge preparation.  The pharmacist that spoke with me during my hospital stay was:  Blossom Hoops, [facility]  Why was Coumadin prescribed for you? Coumadin was prescribed for you because you have a blood clot or a medical condition that can cause an increased risk of forming blood clots. Blood clots can cause serious health problems by blocking the flow of blood to the heart, lung, or brain. Coumadin can prevent harmful blood clots from forming. As a reminder your indication for Coumadin is:   Blood Clot Prevention After Orthopedic Surgery  What test will check on my response to Coumadin? While on Coumadin (warfarin) you will need to have an INR test regularly to ensure that your dose is keeping you in the desired range. The INR (international normalized ratio) number is calculated from the result of the laboratory test called prothrombin time (PT).  If an INR APPOINTMENT HAS NOT ALREADY BEEN MADE FOR YOU please schedule an appointment to have this lab work done by your health care provider within 7 days. Your INR goal is usually a number between:  2 to 3 or your provider may give you a more narrow range like 2-2.5.  Ask your health care provider during an office visit what your goal INR  is.  What  do you need to  know  About  COUMADIN? Take Coumadin (warfarin) exactly as prescribed by your healthcare provider about the same time each day.  DO NOT stop taking without talking to the doctor who prescribed the medication.  Stopping without other blood clot prevention medication to take the place of Coumadin may increase your risk of developing a new clot or stroke.  Get refills before you run out.  What do you do if you miss a dose? If you miss a dose, take it as soon as you remember on the same day then continue your regularly scheduled regimen the next day.  Do not take two doses of Coumadin at the same time.  Important Safety Information A possible side effect of Coumadin (Warfarin) is an increased risk of bleeding. You should call your healthcare provider right away if you experience any of the following: ? Bleeding from an injury or your nose that does not stop. ? Unusual colored urine (red or dark brown) or unusual colored stools (red or black). ? Unusual bruising for unknown reasons. ? A serious fall or if you hit your head (even if there is no bleeding).  Some foods or medicines interact with Coumadin (warfarin) and might alter your response to warfarin. To help avoid this: ? Eat a balanced diet, maintaining a consistent amount of Vitamin K. ? Notify your provider about major diet changes you  plan to make. ? Avoid alcohol or limit your intake to 1 drink for women and 2 drinks for men per day. (1 drink is 5 oz. wine, 12 oz. beer, or 1.5 oz. liquor.)  Make sure that ANY health care provider who prescribes medication for you knows that you are taking Coumadin (warfarin).  Also make sure the healthcare provider who is monitoring your Coumadin knows when you have started a new medication including herbals and non-prescription products.  Coumadin (Warfarin)  Major Drug Interactions  Increased Warfarin Effect Decreased Warfarin Effect  Alcohol (large quantities) Antibiotics  (esp. Septra/Bactrim, Flagyl, Cipro) Amiodarone (Cordarone) Aspirin (ASA) Cimetidine (Tagamet) Megestrol (Megace) NSAIDs (ibuprofen, naproxen, etc.) Piroxicam (Feldene) Propafenone (Rythmol SR) Propranolol (Inderal) Isoniazid (INH) Posaconazole (Noxafil) Barbiturates (Phenobarbital) Carbamazepine (Tegretol) Chlordiazepoxide (Librium) Cholestyramine (Questran) Griseofulvin Oral Contraceptives Rifampin Sucralfate (Carafate) Vitamin K   Coumadin (Warfarin) Major Herbal Interactions  Increased Warfarin Effect Decreased Warfarin Effect  Garlic Ginseng Ginkgo biloba Coenzyme Q10 Green tea St. Johns wort    Coumadin (Warfarin) FOOD Interactions  Eat a consistent number of servings per week of foods HIGH in Vitamin K (1 serving =  cup)  Collards (cooked, or boiled & drained) Kale (cooked, or boiled & drained) Mustard greens (cooked, or boiled & drained) Parsley *serving size only =  cup Spinach (cooked, or boiled & drained) Swiss chard (cooked, or boiled & drained) Turnip greens (cooked, or boiled & drained)  Eat a consistent number of servings per week of foods MEDIUM-HIGH in Vitamin K (1 serving = 1 cup)  Asparagus (cooked, or boiled & drained) Broccoli (cooked, boiled & drained, or raw & chopped) Brussel sprouts (cooked, or boiled & drained) *serving size only =  cup Lettuce, raw (green leaf, endive, romaine) Spinach, raw Turnip greens, raw & chopped   These websites have more information on Coumadin (warfarin):  FailFactory.se; VeganReport.com.au;

## 2014-11-28 NOTE — Progress Notes (Signed)
Physical Therapy Treatment Patient Details Name: Samuel Goodwin MRN: [id number] DOB: 12-11-1961 Today's Date: 11/28/2014    History of Present Illness 53 yo male s/p L TKA with KI WBAT PMH: Hep C, HLD, HTN, GERD< alcoholism sober since 1990, benign positional vertigo, OSA, throat surg x3     PT Comments    Pt is very limited this PM by pain and nausea/lightheadeness even just statically sitting in the recliner chair.  I assisted in transferring him back to bed and we initiated his LE HEP exercises.  I positioned in the CPM and RN was contacted for both pain and nausea meds.  Pt continues to be appropriate for SNF level rehab at discharge.  PT will continue to follow acutely to progress mobility, exercises, and gait.  Follow Up Recommendations  SNF     Equipment Recommendations  Rolling walker with 5" wheels    Recommendations for Other Services   NA     Precautions / Restrictions Precautions Precautions: Knee Required Braces or Orthoses: Knee Immobilizer - Left Knee Immobilizer - Left: Other (comment) (until d/c by MD) Restrictions LLE Weight Bearing: Weight bearing as tolerated    Mobility  Bed Mobility Overal bed mobility: Needs Assistance Bed Mobility: Sit to Supine       Sit to supine: Min assist   General bed mobility comments: Min assist to help lift left leg into the bed to get positioned in supine. Verbal cues for 1/2 bridge to scoot up in the bed and min assist to support leg with this move as well.   Transfers Overall transfer level: Needs assistance Equipment used: Rolling walker (2 wheeled) Transfers: Sit to/from [facility] Sit to Stand: Min assist Stand pivot transfers: Min assist       General transfer comment: Min assist to support trunk during transitions.  RW used for transfer.  Pt very painful in WB on his left leg.   Ambulation/Gait Ambulation/Gait assistance: Min guard Ambulation Distance (Feet): 5 Feet Assistive device:  Rolling walker (2 wheeled) Gait Pattern/deviations: Step-to pattern     General Gait Details: Verbal cues for safe [place] of RW, positioning before sitting down on bed.           Balance Overall balance assessment: Needs assistance Sitting-balance support: Feet supported;No upper extremity supported Sitting balance-Leahy Scale: Good     Standing balance support: Bilateral upper extremity supported Standing balance-Leahy Scale: Fair                      Cognition Arousal/Alertness: Awake/alert Behavior During Therapy: WFL for tasks assessed/performed Overall Cognitive Status: Within Functional Limits for tasks assessed                      Exercises Total Joint Exercises Ankle Circles/Pumps: AROM;Both;10 reps;Supine Quad Sets: AROM;Left;10 reps;Supine Heel Slides: AAROM;Left;10 reps;Supine        Pertinent Vitals/Pain Pain Assessment: 0-10 Pain Score: 8  Pain Location: left knee, low back, right hip and knee Pain Descriptors / Indicators: Aching;Burning Pain Intervention(s): Limited activity within patient's tolerance;Monitored during session;Repositioned;Patient requesting pain meds-RN notified           PT Goals (current goals can now be found in the care plan section) Acute Rehab PT Goals Patient Stated Goal: To go to rehab prior to d/c home Progress towards PT goals: Not progressing toward goals - comment (limited by nausea and lightheadedness in sitting. )    Frequency  7X/week  PT Plan Current plan remains appropriate       End of Session Equipment Utilized During Treatment: Left knee immobilizer Activity Tolerance: Patient limited by pain;Patient limited by fatigue;Other (comment) (limited by nausea) Patient left: in bed;in CPM;with call bell/phone within reach;with nursing/sitter in room     Time: 4599-7741 PT Time Calculation (min) (ACUTE ONLY): 27 min  Charges:  $Therapeutic Exercise: 8-22 mins $Therapeutic Activity: 8-22  mins                      Rainee Sweatt B. Sarai January, PT, DPT [phone]   11/28/2014, 3:39 PM

## 2014-11-29 LAB — CBC
HCT: 33.8 % — ABNORMAL LOW (ref 39.0–52.0)
Hemoglobin: 11.5 g/dL — ABNORMAL LOW (ref 13.0–17.0)
MCH: 30 pg (ref 26.0–34.0)
MCHC: 34 g/dL (ref 30.0–36.0)
MCV: 88.3 fL (ref 78.0–100.0)
PLATELETS: 182 10*3/uL (ref 150–400)
RBC: 3.83 MIL/uL — ABNORMAL LOW (ref 4.22–5.81)
RDW: 13.5 % (ref 11.5–15.5)
WBC: 9.8 10*3/uL (ref 4.0–10.5)

## 2014-11-29 LAB — PROTIME-INR
INR: 2.23 — ABNORMAL HIGH (ref 0.00–1.49)
Prothrombin Time: 24.9 seconds — ABNORMAL HIGH (ref 11.6–15.2)

## 2014-11-29 MED ORDER — WARFARIN SODIUM 5 MG PO TABS
5.0000 mg | ORAL_TABLET | Freq: Once | ORAL | Status: AC
  Administered 2014-11-29: 5 mg via ORAL
  Filled 2014-11-29: qty 1

## 2014-11-29 NOTE — Progress Notes (Signed)
Pt c/o moderate back pain. Requested hydrocortisone shot during day shift. Not sure if back pain is positional d/t being in bed or from overcompensating for left knee prior to surgery. Pt also stated that he has a bulging L5-S1 disc.  Call to [facility] and he ordered IV tylenol 1000 mg every 12 hr x2. Seems to be working well for pt. Bryson recommended that pt follow up with dr that is treating him for his back after discharge. Relayed all info to pt and pt agrees to follow up.

## 2014-11-29 NOTE — Progress Notes (Signed)
Subjective: 2 Days Post-Op Procedure(s) (LRB): LEFT TOTAL KNEE ARTHROPLASTY (Left) Patient reports pain as moderate.   Reports pain L knee as well as lower back radiating into R groin. He has had an MRI of his back and apparently has a bulging disc at L5-S1. He thinks he may be going to [facility] for continued rehab due to limited mobility. His pain did seem to improve some after receiving IV Tylenol last night. Seen in rounds with Dr. Gladstone Lighter.  Objective: Vital signs in last 24 hours: Temp:  [98.1 F (36.7 C)-101 F (38.3 C)] 98.1 F (36.7 C) (12/20 0543) Pulse Rate:  [90-107] 90 (12/20 0543) Resp:  [18] 18 (12/20 0543) BP: (127-156)/(74-106) 127/74 mmHg (12/20 0543) SpO2:  [93 %-96 %] 96 % (12/20 0543)  Intake/Output from previous day: 12/19 0701 - 12/20 0700 In: 960 [P.O.:960] Out: 775 [Urine:775] Intake/Output this shift:     Recent Labs  11/27/14 1429 11/28/14 0530 11/29/14 0345  HGB 13.1 12.1* 11.5*    Recent Labs  11/28/14 0530 11/29/14 0345  WBC 10.3 9.8  RBC 4.21* 3.83*  HCT 37.3* 33.8*  PLT 206 182    Recent Labs  11/27/14 1429 11/28/14 0530  NA  --  139  K  --  3.5*  CL  --  99  CO2  --  30  BUN  --  9  CREATININE 0.68 0.83  GLUCOSE  --  132*  CALCIUM  --  8.2*    Recent Labs  11/28/14 0530 11/29/14 0345  INR 1.34 2.23*    Neurologically intact ABD soft Neurovascular intact Sensation intact distally Intact pulses distally Dorsiflexion/Plantar flexion intact Incision: dressing C/D/I and scant drainage No cellulitis present Compartment soft no calf pain or sign of DVT  Assessment/Plan: 2 Days Post-Op Procedure(s) (LRB): LEFT TOTAL KNEE ARTHROPLASTY (Left) Advance diet Up with therapy D/C IV fluids  Discussed his ongoing back issue, recommends he discusses this with Dr. [name] for referral to a back specialist to discuss possible injections and other tx options Dressing changed Plan D/C tomorrow possibly to SNF for  continued rehab  Gurley, Ellison Hughs M. 11/29/2014, 7:48 AM

## 2014-11-29 NOTE — Progress Notes (Signed)
[place] for coumadin Indication: VTE prophylaxis  Allergies  Allergen Reactions  . Benazepril Hcl Cough    Patient Measurements: Height: 6' (182.9 cm) Weight: 288 lb 1.6 oz (130.681 kg) IBW/kg (Calculated) : 77.6  Vital Signs: Temp: 98.1 F (36.7 C) (12/20 0543) Temp Source: Oral (12/20 0543) BP: 127/74 mmHg (12/20 0543) Pulse Rate: 90 (12/20 0543)  Labs:  Recent Labs  11/27/14 1429 11/28/14 0530 11/29/14 0345  HGB 13.1 12.1* 11.5*  HCT 38.0* 37.3* 33.8*  PLT 203 206 182  LABPROT  --  16.7* 24.9*  INR  --  1.34 2.23*  CREATININE 0.68 0.83  --     Estimated Creatinine Clearance: 143.8 mL/min (by C-G formula based on Cr of 0.83).   Medical History: Past Medical History  Diagnosis Date  . Hyperlipidemia   . Hypertension   . Hypothyroidism   . GERD (gastroesophageal reflux disease)   . Alcohol abuse     Sobriety since 1990  . Benign positional vertigo   . OSA (obstructive sleep apnea)     on CPAP  . Anxiety   . Depression   . PTSD (post-traumatic stress disorder)   . Arthritis   . Hepatitis C 1994    hep c - ? etiology; in [place] 1989-91; S/P interferon , Dr [name]    Medications:  Prescriptions prior to admission  Medication Sig Dispense Refill Last Dose  . ascorbic acid (VITAMIN C) 1000 MG tablet Take 1,000 mg by mouth every morning.   Past Month at Unknown time  . aspirin EC 81 MG tablet Take 81 mg by mouth daily.   Past Month at Unknown time  . atorvastatin (LIPITOR) 40 MG tablet Take 40 mg by mouth every evening.    Past Week at Unknown time  . Cholecalciferol (VITAMIN D PO) Take 2 capsules by mouth every morning.   Past Month at Unknown time  . hydrochlorothiazide (HYDRODIURIL) 25 MG tablet Take 1 tablet (25 mg total) by mouth daily. (Patient taking differently: Take 25 mg by mouth every morning. ) 90 tablet 3 11/26/2014 at Unknown time  . ibuprofen (ADVIL,MOTRIN) 200 MG tablet Take 800 mg by mouth daily as  needed for mild pain.   Past Month at Unknown time  . levothyroxine (LEVOTHROID) 25 MCG tablet Take 25 mcg by mouth daily before breakfast.   11/27/2014 at 0615  . levothyroxine (SYNTHROID, LEVOTHROID) 200 MCG tablet Take 200 mcg by mouth daily before breakfast.   11/27/2014 at 0615  . losartan (COZAAR) 50 MG tablet Take 50 mg by mouth daily.   11/26/2014 at Unknown time  . Multiple Vitamin (MULTIVITAMIN) tablet Take 1 tablet by mouth every morning.    Past Month at Unknown time  . naproxen (NAPROSYN) 500 MG tablet Take 1 tablet (500 mg total) by mouth 2 (two) times daily. 60 tablet 0 Past Month at Unknown time  . omeprazole (PRILOSEC) 20 MG capsule Take 20 mg by mouth 2 (two) times daily before a meal.    11/27/2014 at 0615    Assessment: 53 yo man continuing coumadin for VTE px s/p L TKA. He has also been on lovenox 30 mg sq q12 hours which will stop when INR >1.8. INR now therapeutic this AM 1.34>>2.23. CBC ok. No bleeding documented.  Goal of Therapy:  INR 2-3 Monitor platelets by anticoagulation protocol: Yes   Plan:  -Coumadin 5 mg po x1 dose tonight -D/c enoxaparin with therapeutic INR -Daily PT/INR -Monitor s/sx bleeding -Coumadin  book/video ordered, education completed  Elicia Lamp, PharmD Clinical Pharmacist - Resident Pager [phone] 11/29/2014 9:13 AM

## 2014-11-29 NOTE — Progress Notes (Signed)
Physical Therapy Treatment Patient Details Name: Samuel Goodwin MRN: [id number] DOB: August 13, 1961 Today's Date: 11/29/2014    History of Present Illness 53 yo male s/p L TKA with KI WBAT PMH: Hep C, HLD, HTN, GERD< alcoholism sober since 1990, benign positional vertigo, OSA, throat surg x3     PT Comments    Making progress with mobility and activity tolerance; Much improved ability to WB on LLE during gait; Cues to activate quad for stance stability; Knee flexion range limited by pain  Follow Up Recommendations  SNF     Equipment Recommendations  Rolling walker with 5" wheels    Recommendations for Other Services       Precautions / Restrictions Precautions Required Braces or Orthoses: Knee Immobilizer - Left Knee Immobilizer - Left: Other (comment) (until d/c by MD) Restrictions Weight Bearing Restrictions: Yes LLE Weight Bearing: Weight bearing as tolerated  Pt educated to not allow any pillow or bolster under knee for healing with optimal range of motion.    Mobility  Bed Mobility Overal bed mobility: Needs Assistance Bed Mobility: Supine to Sit     Supine to sit: Min assist     General bed mobility comments: Cues for technique and to more use rolling technqiue for back pain  Transfers Overall transfer level: Needs assistance Equipment used: Rolling walker (2 wheeled) Transfers: Sit to/from Stand Sit to Stand: Min assist         General transfer comment: Min assist to support trunk during transitions.  RW used for transfer.  Pt very painful in WB on his left leg.   Ambulation/Gait Ambulation/Gait assistance: Min guard Ambulation Distance (Feet): 40 Feet Assistive device: Rolling walker (2 wheeled) Gait Pattern/deviations: Step-to pattern Gait velocity: decrased   General Gait Details: Verbal cues for safe [place] of RW, positioning before sitting down to chair; Cues also for activity tolerance as pt reported dizziness   Stairs             Wheelchair Mobility    Modified Rankin (Stroke Patients Only)       Balance                                    Cognition Arousal/Alertness: Awake/alert Behavior During Therapy: WFL for tasks assessed/performed Overall Cognitive Status: Within Functional Limits for tasks assessed                      Exercises Total Joint Exercises Ankle Circles/Pumps: AROM;Both;10 reps;Supine Quad Sets: AROM;Left;10 reps;Supine Short Arc Quad: AAROM;Left;10 reps Heel Slides: AAROM;Left;10 reps;Supine Straight Leg Raises: AAROM;Left;10 reps Goniometric ROM: approx 2-40 deg    General Comments        Pertinent Vitals/Pain Pain Assessment: Faces Faces Pain Scale: Hurts whole lot Pain Location: L knee, low back with therex Pain Descriptors / Indicators: Grimacing (at end of tolerated knee flexion range) Pain Intervention(s): Monitored during session;Limited activity within patient's tolerance;Repositioned    Home Living                      Prior Function            PT Goals (current goals can now be found in the care plan section) Acute Rehab PT Goals Patient Stated Goal: To go to rehab prior to d/c home PT Goal Formulation: With patient/family Time For Goal Achievement: 12/02/14 Potential to Achieve Goals: Good Progress towards PT goals:  Progressing toward goals    Frequency  7X/week    PT Plan Current plan remains appropriate    Co-evaluation             End of Session Equipment Utilized During Treatment: Left knee immobilizer Activity Tolerance: Patient tolerated treatment well;Patient limited by pain Patient left: in chair;with call bell/phone within reach     Time: 0736-0811 PT Time Calculation (min) (ACUTE ONLY): 35 min  Charges:  $Gait Training: 8-22 mins $Therapeutic Exercise: 8-22 mins                    G Codes:      [name] 11/29/2014, 9:55 AM  [name], [place] Pager [phone] Office [phone]

## 2014-11-29 NOTE — Progress Notes (Signed)
Physical Therapy Treatment Patient Details Name: Samuel Goodwin MRN: [id number] DOB: June 01, 1961 Today's Date: 11/29/2014    History of Present Illness 53 yo male s/p L TKA with KI WBAT PMH: Hep C, HLD, HTN, GERD< alcoholism sober since 1990, benign positional vertigo, OSA, throat surg x3     PT Comments    Continuing progress with functional mobility, especially with good L stance stability; Needing continued work on L knee AROM, noting some co-contraction with knee flexion  Follow Up Recommendations  SNF     Equipment Recommendations  Rolling walker with 5" wheels    Recommendations for Other Services       Precautions / Restrictions Precautions Precautions: Knee Precaution Comments: Pt educated to not allow any pillow or bolster under knee for healing with optimal range of motion.  Required Braces or Orthoses: Knee Immobilizer - Left Knee Immobilizer - Left: Other (comment) (until d/c by MD) Restrictions LLE Weight Bearing: Weight bearing as tolerated    Mobility  Bed Mobility Overal bed mobility: Needs Assistance Bed Mobility: Sit to Supine     Supine to sit: Min assist     General bed mobility comments: Cues for technique light min assist to help LLE into bed  Transfers Overall transfer level: Needs assistance Equipment used: Rolling walker (2 wheeled) Transfers: Sit to/from Stand Sit to Stand: Min guard         General transfer comment: Cues for positioning and technique; no need for physical assist today; demo cues for sitting back to bed  Ambulation/Gait Ambulation/Gait assistance: Min guard (without physical contact) Ambulation Distance (Feet): 80 Feet Assistive device: Rolling walker (2 wheeled)   Gait velocity: decrased   General Gait Details: Cues for gait sequence and to activate L quad for stance stability   Stairs            Wheelchair Mobility    Modified Rankin (Stroke Patients Only)       Balance                                     Cognition Arousal/Alertness: Awake/alert Behavior During Therapy: WFL for tasks assessed/performed Overall Cognitive Status: Within Functional Limits for tasks assessed                      Exercises Total Joint Exercises Knee Flexion: AAROM;Left;5 reps;Seated (using gravity to help encourage flexion)    General Comments        Pertinent Vitals/Pain Pain Assessment: Faces Faces Pain Scale: Hurts even more Pain Location: L knee with flexion therex; subsided and relatively comfortable in CPM end of session Pain Descriptors / Indicators: Grimacing Pain Intervention(s): Monitored during session;Repositioned    Home Living                      Prior Function            PT Goals (current goals can now be found in the care plan section) Acute Rehab PT Goals Patient Stated Goal: To go to rehab prior to d/c home PT Goal Formulation: With patient/family Time For Goal Achievement: 12/02/14 Potential to Achieve Goals: Good Progress towards PT goals: Progressing toward goals    Frequency  7X/week    PT Plan Current plan remains appropriate    Co-evaluation             End of Session Equipment Utilized During  Treatment: Left knee immobilizer Activity Tolerance: Patient tolerated treatment well;Patient limited by pain Patient left: with call bell/phone within reach;in bed;in CPM;with family/visitor present     Time: 5583-1674 PT Time Calculation (min) (ACUTE ONLY): 33 min  Charges:  $Gait Training: 8-22 mins $Therapeutic Exercise: 8-22 mins                    G Codes:      [name] 11/29/2014, 3:36 PM  [name], [place] Pager [phone] Office [phone]

## 2014-11-29 NOTE — Progress Notes (Signed)
Clinical Social Work Department BRIEF PSYCHOSOCIAL ASSESSMENT 11/29/2014  Patient:  Samuel Goodwin, Samuel Goodwin     Account Number:  [id number]     Admit date:  11/27/2014  Clinical Social Worker:  Thalia Party  Date/Time:  11/29/2014 01:54 PM  Referred by:  Physician  Date Referred:  11/27/2014 Referred for  SNF Placement   Other Referral:   Interview type:  Patient Other interview type:    PSYCHOSOCIAL DATA Living Status:  WIFE Admitted from facility:   Level of care:   Primary support name:  Fabio Wah Primary support relationship to patient:  SPOUSE Degree of support available:   Strong support    CURRENT CONCERNS  Other Concerns:    SOCIAL WORK ASSESSMENT / PLAN CSW met with this 53 y/o, married, Caucasian, male who presents in hospital  garb stating he is feeling better and less nausea.  Patient states that he has finally gotten his left knee replacement and that he will probably need his right knee and hip operated on as well.  Patient states he has adequate family support at home, but at this point he may need a short term rehab in order to get stronger and navigate his house/bathroom.  Patient is in good spirits, affect, speech, thought process all normal.  Patient did nt verbalize any feelings of helplessness or worthlessness nor any type of ideation.   Assessment/plan status:   Other assessment/ plan:   Information/referral to community resources:    PATIENT'S/FAMILY'S RESPONSE TO PLAN OF CARE: Patient is requesting that he goes to [facility] for rehab.  He feels that due to his right knee and hip hurting as wel that he is not steady enough and is afraid he might fall. Patient is accepting of the plan to go to short term Rehab.     [facility] Travell Desaulniers Richardo Priest ED CSW [phone]

## 2014-11-30 ENCOUNTER — Encounter (HOSPITAL_COMMUNITY): Payer: Self-pay | Admitting: General Practice

## 2014-11-30 LAB — CBC
HCT: 32.2 % — ABNORMAL LOW (ref 39.0–52.0)
Hemoglobin: 10.9 g/dL — ABNORMAL LOW (ref 13.0–17.0)
MCH: 29 pg (ref 26.0–34.0)
MCHC: 33.9 g/dL (ref 30.0–36.0)
MCV: 85.6 fL (ref 78.0–100.0)
PLATELETS: 197 10*3/uL (ref 150–400)
RBC: 3.76 MIL/uL — AB (ref 4.22–5.81)
RDW: 13.1 % (ref 11.5–15.5)
WBC: 9.6 10*3/uL (ref 4.0–10.5)

## 2014-11-30 LAB — PROTIME-INR
INR: 2.46 — ABNORMAL HIGH (ref 0.00–1.49)
Prothrombin Time: 26.9 seconds — ABNORMAL HIGH (ref 11.6–15.2)

## 2014-11-30 NOTE — Clinical Social Work Placement (Signed)
Clinical Social Work Department CLINICAL SOCIAL WORK PLACEMENT NOTE 11/30/2014  Patient:  Samuel Goodwin, Samuel Goodwin  Account Number:  [id number] Admit date:  11/27/2014  Clinical Social Worker:  Welford Roche, [place]  Date/time:  11/29/2014 02:04 PM  Clinical Social Work is seeking post-discharge placement for this patient at the following level of care:   SKILLED NURSING   (*CSW will update this form in Epic as items are completed)   11/29/2014  Patient/family provided with [facility] Department of Clinical Social Work's list of facilities offering this level of care within the geographic area requested by the patient (or if unable, by the patient's family).  11/29/2014  Patient/family informed of their freedom to choose among providers that offer the needed level of care, that participate in Medicare, Medicaid or managed care program needed by the patient, have an available bed and are willing to accept the patient.  11/29/2014  Patient/family informed of [facility]' ownership interest in [facility], as well as of the fact that they are under no obligation to receive care at this facility.  PASARR submitted to EDS on 11/29/2014 PASARR number received on 11/29/2014  FL2 transmitted to all facilities in geographic area requested by pt/family on  11/29/2014 FL2 transmitted to all facilities within larger geographic area on   Patient informed that his/her managed care company has contracts with or will negotiate with  certain facilities, including the following:     Patient/family informed of bed offers received:  11/30/2014 Patient chooses bed at [place] Physician recommends and patient chooses bed at    Patient to be transferred to [facility] on  11/30/2014 Patient to be transferred to facility by [facility] Patient and family notified of transfer on 11/30/2014 Name of family member notified:  Pt's wife updated at bedside.  The following physician request were entered in  Epic:   Additional Comments:  [facility] [place] [phone]) Licensed Clinical Social Worker Orthopedics [phone]) and Surgical [phone])

## 2014-11-30 NOTE — Op Note (Signed)
Samuel Goodwin, Samuel Goodwin NO.:  [id number]  MEDICAL RECORD NO.:  [id number]  LOCATION:  [id number]                        FACILITY:  [facility]  PHYSICIAN:  Doran Heater. [name], M.D. DATE OF BIRTH:  1961-01-24  DATE OF PROCEDURE:  11/27/2014 DATE OF DISCHARGE:  11/30/2014                              OPERATIVE REPORT   PREOPERATIVE DIAGNOSIS:  Left knee end-stage osteoarthritis.  POSTOPERATIVE DIAGNOSIS:  Left knee end-stage osteoarthritis.  PROCEDURE PERFORMED:  Left total knee arthroplasty using DePuy Sigma rotating platform prosthesis.  ATTENDING SURGEON:  Doran Heater. [name], MD.  ASSISTANT:  Abbott Pao. Dixon, PA-C, who scrubbed the entire procedure and necessary for satisfactory completion of surgery.  ANESTHESIA:  General anesthesia was used plus femoral block.  ESTIMATED BLOOD LOSS:  Minimal.  FLUID REPLACEMENT:  1500 mL crystalloids.  INSTRUMENT COUNTS:  Correct.  COMPLICATIONS:  There were no complications.  ANTIBIOTICS:  Perioperative antibiotics were given.  INDICATIONS:  The patient is a 53 year old male who is status post knee arthroscopy on the left knee.  He has had progressive post injury and post arthroscopy arthritis developed in the knee joint where he has bone- on-bone on x-ray and is limited in his ability to mobilize.  He also has a bad right hip.  From an arthritis standpoint, the patient has very limited mobility and desires to return to an active lifestyle.  We discussed options for management.  At this point, having failed arthroscopy, really the only option for him is total knee arthroplasty. Informed consent was obtained.  DESCRIPTION OF PROCEDURE:  After an adequate level of anesthesia was achieved, the patient was positioned supine in the operating room table. The left leg correctly identified and a nonsterile tourniquet placed in proximal thigh, left leg sterilely prepped and draped in the usual manner.  Time-out was called.  After our  time-out, we exsanguinated the leg using an Esmarch bandage.  We elevated the tourniquet to 350 mmHg. With the knee in flexion, longitudinal midline incision was created with a 10 blade scalpel.  Dissection carried down through subcutaneous tissues.  Medial parapatellar arthrotomy created with a fresh 10 blade scalpel.  Lateral patellofemoral ligaments divided.  Patellae everted. Distal femur entered using a step-cut drill.  Distal femur resected using oscillating saw, set on 5 degrees left 10 mm.  We then sized the femur anterior down to a size 4 femoral component.  We performed our anterior, posterior, and chamfer cuts off a 4-in-1 block.  We then removed ACL, PCL, and remaining meniscal tissue.  Then, subluxed the tibia anterior to the femur.  We next went ahead and did our tibial resection.  We used an external alignment jig and resected 4 mm off the affected medial side, perpendicular long axis of the tibia with minimal posterior slope.  We then completed our tibial preparation with modular drill and keel punch.  We then removed excess bone spurs and osteophytes off the posterior femoral condyles using a lamina spreader and curved osteotome.  We then prepared our femur with the box cut guide and resected for the posterior cruciate substituting prosthesis.  We impacted the trial tibial tray and the trial femur in place.  This  was a size 4 for the femur and a 5 to the tibia.  We were using the rotating platform prosthesis Sigma DePuy implant.  Next, we placed a 12.5 poly spacer in and reduced the knee.  We are pleased with soft tissue balancing and felt like we definitely get 15 in.  We then trialed the 15, we were able to get full extension with a nice stable knee in flexion and extension.  We then went ahead and resurfaced our patella going from 27 mm thickness down to an 18 mm thickness and size 38.  We drilled, placed our patellar button in place.  We ranged the knee, and we had  nice tracking with no touch technique.  We removed all trial components, pulse irrigated the knee, and then cemented the components into place using DePuy SmartSet cement.  Of note, as I was placing the femoral component on, the cement seemed to be setting up more quickly than usual.  We went ahead and did not cement the patella with that same cement.  Placed the knee in full extension with a 15 poly, and we were pleased with that but the cement was just sitting up too quickly, so we mixed a second batch of SmartSet cement and cemented the patella with a second batch.  We placed a patella clamp.  We allowed all cement to harden in field until we remove the poly.  We ranged the knee, removed excess cement using quarter-inch curved osteotome, inspected posterior aspect of the knee, and then selected the real 15 mm poly spacer size 4, and this was posterior cruciate substituting prosthesis.  We then reduced the knee with a nice snap and had nice soft tissue balancing and patellar tracking.  We thoroughly irrigated the wound, closed the parapatellar arthrotomy with interrupted #1 Vicryl suture followed by 0 Vicryl, subcutaneous closure with 2-0 Vicryl, subcutaneous closure and 4- 0 Monocryl for skin.  Steri-Strips applied followed by sterile dressing and knee immobilizer.  The patient tolerated the surgery well.     Doran Heater. [name], M.D.     SRN/MEDQ  D:  11/30/2014  T:  11/30/2014  Job:  [id number]

## 2014-11-30 NOTE — Progress Notes (Signed)
Physical Therapy Treatment Patient Details Name: Samuel Goodwin MRN: [id number] DOB: 1961-09-11 Today's Date: 11/30/2014    History of Present Illness 53 yo male s/p L TKA with KI WBAT PMH: Hep C, HLD, HTN, GERD< alcoholism sober since 1990, benign positional vertigo, OSA, throat surg x3     PT Comments    Pt is progressing slowly with his exercises.  He has a poor quad set and limited knee flexion ROM.  We reviewed his entire exercise series this AM and pt tolerated it well.  He is very hard working and I believe now that his pain is better controlled we can be more aggressive with his knee ROM (flexion especially).  Pt plans to d/c to SNF for rehab later today.   Follow Up Recommendations  SNF     Equipment Recommendations  Rolling walker with 5" wheels    Recommendations for Other Services   NA     Precautions / Restrictions Precautions Precautions: Knee Precaution Comments: Reviewed WBAT status and no pillow under operated knee rule.  Required Braces or Orthoses: Knee Immobilizer - Left Knee Immobilizer - Left: Other (comment) (until d/c by PT) Restrictions LLE Weight Bearing: Weight bearing as tolerated    Mobility  Bed Mobility               General bed mobility comments: up in recliner upon arrival  Transfers Overall transfer level: Needs assistance Equipment used: Rolling walker (2 wheeled) Transfers: Sit to/from Stand Sit to Stand: Supervision         General transfer comment: Supervision for safety.  Pt able to demonstrate correct hand placement without cues.   Ambulation/Gait Ambulation/Gait assistance: Supervision Ambulation Distance (Feet): 90 Feet Assistive device: Rolling walker (2 wheeled) Gait Pattern/deviations: Step-to pattern;Antalgic Gait velocity: decreased Gait velocity interpretation: Below normal speed for age/gender General Gait Details: Cues for upright posture, slow gait speed and pt reporting that he continues to have some  dizziness (blurinees) with gait. I am wondering if his vertigo history is coming into play here.           Balance Overall balance assessment: Needs assistance Sitting-balance support: Feet supported;No upper extremity supported Sitting balance-Leahy Scale: Good     Standing balance support: Bilateral upper extremity supported;Single extremity supported;No upper extremity supported Standing balance-Leahy Scale: Fair                      Cognition Arousal/Alertness: Awake/alert Behavior During Therapy: WFL for tasks assessed/performed Overall Cognitive Status: Within Functional Limits for tasks assessed                      Exercises Total Joint Exercises Ankle Circles/Pumps: AROM;Both;10 reps;Supine Quad Sets: AROM;Left;10 reps;Supine Towel Squeeze: AROM;Both;10 reps;Supine Short Arc Quad: AAROM;Left;10 reps;Supine Heel Slides: AAROM;Left;10 reps;Supine Hip ABduction/ADduction: AAROM;Left;10 reps;Supine Straight Leg Raises: AAROM;Left;10 reps;Supine Long Arc Quad: AROM;AAROM;Left;10 reps;Seated Knee Flexion: AROM;AAROM;Left;Seated;20 reps Goniometric ROM: 5-45        Pertinent Vitals/Pain Pain Assessment: 0-10 Pain Score: 5  Pain Location: knee, left Pain Descriptors / Indicators: Aching;Burning Pain Intervention(s): Limited activity within patient's tolerance;Monitored during session;Repositioned;Premedicated before session    Home Living Family/patient expects to be discharged to:: Other (Comment) ([facility]) Living Arrangements: Spouse/significant other                      PT Goals (current goals can now be found in the care plan section) Acute Rehab PT Goals Patient Stated Goal:  To go to rehab prior to d/c home Progress towards PT goals: Progressing toward goals    Frequency  7X/week    PT Plan Current plan remains appropriate       End of Session Equipment Utilized During Treatment: Left knee immobilizer Activity  Tolerance: Patient limited by pain Patient left: in chair;with call bell/phone within reach;with family/visitor present     Time: 6295-2841 PT Time Calculation (min) (ACUTE ONLY): 28 min  Charges:  $Gait Training: 8-22 mins $Therapeutic Exercise: 23-37 mins                      Dennisse Swader B. Venesha Petraitis, PT, DPT [phone]   11/30/2014, 1:34 PM

## 2014-11-30 NOTE — Progress Notes (Signed)
Pt still c/o discomfort in lower back.  Pt refused bone foam. Stated that it is too painful to use. Pt stated that he slept well last night and feels much better than he did 24 hours ago.

## 2014-11-30 NOTE — Clinical Social Work Note (Signed)
Pt to be discharged to [facility]. Pt updated at bedside.  Facility: [facility] Report number: [phone] Transportation: EMS ([address])  Lubertha Sayres, [place] ([phone]) Licensed Clinical Social Worker Orthopedics [phone]) and Surgical [phone])

## 2014-11-30 NOTE — Progress Notes (Signed)
Physical Therapy Treatment Patient Details Name: Samuel Goodwin MRN: [id number] DOB: 1961-08-26 Today's Date: 11/30/2014    History of Present Illness 53 yo male s/p L TKA with KI WBAT PMH: Hep C, HLD, HTN, GERD< alcoholism sober since 1990, benign positional vertigo, OSA, throat surg x3     PT Comments    Pt continues to progress with every session now.  He reports minimal, but present low back right hip and right knee pain with audible popping at times with right knee movement.  Pt is still very tight in flexion, so spent significant amount of this session with active and active assistive flexion exercises.  PT will continue to follow acutely until pt d/c to SNF.   Follow Up Recommendations  SNF     Equipment Recommendations  Rolling walker with 5" wheels    Recommendations for Other Services   NA     Precautions / Restrictions Precautions Precautions: Knee Precaution Comments: Reviewed no pillow under operated knee Required Braces or Orthoses: Knee Immobilizer - Left Knee Immobilizer - Left: Other (comment) (until d/c by MD) Restrictions LLE Weight Bearing: Weight bearing as tolerated    Mobility  Bed Mobility Overal bed mobility: Needs Assistance Bed Mobility: Sit to Supine;Supine to Sit     Supine to sit: Modified independent (Device/Increase time) Sit to supine: Min assist   General bed mobility comments: Mod I to progress leg over EOB, pt using hands to move leg.  Min assist to help lift leg up to get back into bed at end of session.   Transfers Overall transfer level: Needs assistance Equipment used: Rolling walker (2 wheeled) Transfers: Sit to/from Stand Sit to Stand: Supervision         General transfer comment: supervision for safety due to slow transitions and heavy reliance on hands for support.   Ambulation/Gait Ambulation/Gait assistance: Supervision Ambulation Distance (Feet): 90 Feet Assistive device: Rolling walker (2 wheeled) Gait  Pattern/deviations: Step-to pattern;Antalgic;Trunk flexed Gait velocity: decreased Gait velocity interpretation: Below normal speed for age/gender General Gait Details: moderately antalgic gait pattern.  Step to pattern, verbal cues for upright posture and decreased pressure on bil hands if he can attempt to put more pressure through his feet.           Balance Overall balance assessment: Needs assistance Sitting-balance support: Feet supported;No upper extremity supported Sitting balance-Leahy Scale: Good     Standing balance support: Single extremity supported;Bilateral upper extremity supported;No upper extremity supported Standing balance-Leahy Scale: Fair                      Cognition Arousal/Alertness: Awake/alert Behavior During Therapy: WFL for tasks assessed/performed Overall Cognitive Status: Within Functional Limits for tasks assessed                      Exercises Total Joint Exercises Ankle Circles/Pumps: AROM;Both;10 reps;Supine Quad Sets: AROM;Left;10 reps;Supine Towel Squeeze: AROM;Both;10 reps;Supine Short Arc Quad: AAROM;Left;10 reps;Supine Heel Slides: AAROM;Left;10 reps;Supine Hip ABduction/ADduction: AAROM;Left;10 reps;Supine Straight Leg Raises: AAROM;Left;10 reps;Supine Long Arc Quad: AROM;AAROM;Left;10 reps;Seated Knee Flexion: AROM;AAROM;Left;Seated;20 reps Goniometric ROM: 5-55        Pertinent Vitals/Pain Pain Assessment: 0-10 Pain Score: 7  Pain Location: left knee, right knee, low back Pain Descriptors / Indicators: Aching;Burning Pain Intervention(s): Limited activity within patient's tolerance;Monitored during session;Repositioned;Patient requesting pain meds-RN notified           PT Goals (current goals can now be found in the care plan section)  Acute Rehab PT Goals Patient Stated Goal: To go to rehab prior to d/c home Progress towards PT goals: Progressing toward goals    Frequency  7X/week    PT Plan  Current plan remains appropriate       End of Session Equipment Utilized During Treatment: Left knee immobilizer Activity Tolerance: Patient limited by pain Patient left: in bed;in CPM;with call bell/phone within reach;with family/visitor present     Time: 8110-3159 PT Time Calculation (min) (ACUTE ONLY): 54 min  Charges:  $Gait Training: 8-22 mins $Therapeutic Exercise: 23-37 mins $Therapeutic Activity: 8-22 mins                     Samuel Goodwin, PT, DPT [phone]   11/30/2014, 3:43 PM

## 2014-11-30 NOTE — Progress Notes (Signed)
Physical Therapy Treatment Patient Details Name: Samuel Goodwin MRN: [id number] DOB: 1961-10-16 Today's Date: 11/30/2014    History of Present Illness 53 yo male s/p L TKA with KI WBAT PMH: Hep C, HLD, HTN, GERD< alcoholism sober since 1990, benign positional vertigo, OSA, throat surg x3     PT Comments    Pt is progressing well with his mobility, now that his pain (back, right hip/leg, and left knee), dizziness, HA are all improved.  Pt needs further reinforcement of knee exercises as his knee flexion is significantly limited at this time (40s flexion).  PT will continue to follow acutely until d/c.   Follow Up Recommendations  SNF     Equipment Recommendations  Rolling walker with 5" wheels    Recommendations for Other Services   NA     Precautions / Restrictions Precautions Precautions: Knee Required Braces or Orthoses: Knee Immobilizer - Left Knee Immobilizer - Left: Other (comment) (until d/c by MD. ) Restrictions Weight Bearing Restrictions: Yes LLE Weight Bearing: Weight bearing as tolerated    Mobility                 Transfers Overall transfer level: Needs assistance Equipment used: Rolling walker (2 wheeled) Transfers: Sit to/from Stand Sit to Stand: Supervision         General transfer comment: Supervision for safety.  Pt able to demonstrate correct hand placement without cues.   Ambulation/Gait Ambulation/Gait assistance: Supervision Ambulation Distance (Feet): 90 Feet Assistive device: Rolling walker (2 wheeled) Gait Pattern/deviations: Step-to pattern;Antalgic Gait velocity: decreased Gait velocity interpretation: Below normal speed for age/gender General Gait Details: Cues for upright posture, slow gait speed and pt reporting that he continues to have some dizziness (blurinees) with gait. I am wondering if his vertigo history is coming into play here.                Balance Overall balance assessment: Needs  assistance Sitting-balance support: Feet supported;No upper extremity supported Sitting balance-Leahy Scale: Good     Standing balance support: Bilateral upper extremity supported;Single extremity supported;No upper extremity supported Standing balance-Leahy Scale: Fair                      Cognition Arousal/Alertness: Awake/alert Behavior During Therapy: WFL for tasks assessed/performed Overall Cognitive Status: Within Functional Limits for tasks assessed                             Pertinent Vitals/Pain Pain Assessment: 0-10 Pain Score: 5  Pain Location: left knee, low back, right hip and right knee.  Pain Descriptors / Indicators: Aching;Burning Pain Intervention(s): Limited activity within patient's tolerance;Monitored during session;Repositioned;Premedicated before session           PT Goals (current goals can now be found in the care plan section) Acute Rehab PT Goals Patient Stated Goal: To go to rehab prior to d/c home Progress towards PT goals: Progressing toward goals    Frequency  7X/week    PT Plan Current plan remains appropriate       End of Session Equipment Utilized During Treatment: Left knee immobilizer Activity Tolerance: Patient limited by pain Patient left: in chair;with call bell/phone within reach;with family/visitor present;Other (comment) (with OT)     Time: 1000-1017 PT Time Calculation (min) (ACUTE ONLY): 17 min  Charges:  $Gait Training: 8-22 mins  Barbarann Ehlers Jenavie Stanczak, PT, DPT [phone]   11/30/2014, 10:24 AM

## 2014-11-30 NOTE — Op Note (Signed)
Samuel Goodwin, Samuel Goodwin NO.:  [id number]  MEDICAL RECORD NO.:  [id number]  LOCATION:                                 FACILITY:  PHYSICIAN:  Doran Heater. [name], M.D. DATE OF BIRTH:  1961-09-25  DATE OF PROCEDURE:  11/27/2014 DATE OF DISCHARGE:  11/27/2014                              OPERATIVE REPORT   PREOPERATIVE DIAGNOSIS:  Left knee end-stage osteoarthritis.  POSTOPERATIVE DIAGNOSIS:  Left knee end-stage osteoarthritis.  PROCEDURE PERFORMED:  Left total knee replacement using DePuy Sigma rotating platform prosthesis.  ATTENDING SURGEON:  Doran Heater. [name], M.D.  ASSISTANT:  Abbott Pao. Dixon, PA-C, who was scrubbed the entire procedure and necessary for satisfactory completion of surgery.  ANESTHESIA:  General anesthesia was used plus interscalene block.  ESTIMATED BLOOD LOSS:  Minimal.  FLUID REPLACEMENT:  1200 mL crystalloids.  INSTRUMENT COUNTS:  Correct.  COMPLICATIONS:  There were no complications.  ANTIBIOTICS:  Perioperative antibiotics were given.  INDICATIONS:  The patient is a 53 year old male with worsening left knee pain secondary to end-stage arthritis.  The patient has failed conservative management including prior arthroscopy.  He has had injections, modification activities, and presents with nearly disabling left knee pain impairing his mobility, desiring operative treatment to restore function, eliminate pain in the knee.  Informed consent obtained.  DESCRIPTION OF PROCEDURE:  After an adequate level of anesthesia was achieved, the patient was positioned in supine on the operating table. The left leg was correctly identified.  Nonsterile tourniquet was placed on left proximal thigh.  Left leg was sterilely prepped and draped in usual manner.  Time-out was called.  We elevated the leg and exsanguinated using Esmarch bandage and elevating the tourniquet to 350 mmHg.  With the knee in flexion, a longitudinal incision was created with a  10 blade scalpel.  Dissection down through subcutaneous tissues, medial parapatellar arthrotomy created with a fresh knife, and lateral patellofemoral ligaments divided.  The distal femur was entered using a step-cut drill.  We went ahead and resected 10 mm of bone off the left femur set on 5 degrees left with an intramedullary guide.  We then went ahead and sized the femur anterior down to a size 4 and then performed anterior, posterior, and chamfer cuts off performed with 4-in-1 block. ACL, PCL, and meniscal tissues were removed.  The tibia is subluxed anteriorly.  We went ahead and resected the tibia 4 mm off the affected medial side with the external alignment jig with minimal posterior slope and perpendicular long axis of the tibia.  Once we had resected, we checked our gaps, which were symmetric at 12.5 mm.  We then went ahead and completed our tibial preparation with the modular drilling keel punch.  We then used our box cut guide for the posterior cruciate substituting femoral preparation.  Once we did our box cut, we impacted a real size 4 femur into place.  We then reduced the knee with a 12.5 poly insert, and felt like we could definitely get 15 in place, so we did trial a 15 and obtained a full extension and a nice flexion stability.  We then resurfaced the patella going down from a  27 mm thickness down to an 18 mm thickness using the patellar jig and an oscillating saw.  We then drilled for the 38 patellar button.  Once we placed our trial patella button in place, we ranged the knee and had normal patellar tracking with no-touch technique.  We removed all trial components, pulse irrigated the knee, and dried the knee and then cemented the components into place with high viscosity DePuy cement, first the tibia and then the femur.  The cement seems to be setting up quickly in this batch, and thus we elected to proceed even at 8 minutes. The cement was getting harder, and so we  elected proceed with a separate batch for the patella.  Once we had that next, we cemented the patella in place in place of patellar clamp.  We allowed the cement to harden and then removed all excess cement using quarter-inch curved osteotome. We thoroughly irrigated the knee.  We trialed again with a 15 and felt like that it was appropriate.  We selected a size 15 real polyethylene insert and that was a size for 15.  We placed that in the knee and reduced near the nice pop medially, had good stability in flexion and extension, nice patellar tracking, and then repaired after thorough irrigation, repaired with parapatellar arthrotomy with interrupted #1 Vicryl suture, followed by 0 and 2-0 Vicryl layered subcutaneous closure, and 4-0 Monocryl for skin.  Steri-Strips applied followed by a sterile dressing.  The patient tolerated the procedure well.     Doran Heater. [name], M.D.     SRN/MEDQ  D:  11/27/2014  T:  11/28/2014  Job:  [id number]

## 2014-11-30 NOTE — Progress Notes (Signed)
Report was called to [facility].

## 2014-11-30 NOTE — Discharge Summary (Signed)
Physician Discharge Summary   Patient ID: Samuel Goodwin MRN: [id number] DOB/AGE: 53-Jul-1962 52 y.o.  Admit date: 11/27/2014 Discharge date: 11/30/2014  Admission Diagnoses:  Active Problems:   S/P knee replacement   Discharge Diagnoses:  Same   Surgeries: Procedure(s): LEFT TOTAL KNEE ARTHROPLASTY on 11/27/2014   Consultants: PT/OT  Discharged Condition: Stable  Hospital Course: NOLEN LINDAMOOD is an 53 y.o. male who was admitted 11/27/2014 with a chief complaint of No chief complaint on file. , and found to have a diagnosis of <principal problem not specified>.  They were brought to the operating room on 11/27/2014 and underwent the above named procedures.    The patient had an uncomplicated hospital course and was stable for discharge.  Recent vital signs:  Filed Vitals:   11/30/14 0800  BP:   Pulse:   Temp:   Resp: 18    Recent laboratory studies:  Results for orders placed or performed during the hospital encounter of 11/27/14  CBC  Result Value Ref Range   WBC 14.1 (H) 4.0 - 10.5 K/uL   RBC 4.42 4.22 - 5.81 MIL/uL   Hemoglobin 13.1 13.0 - 17.0 g/dL   HCT 38.0 (L) 39.0 - 52.0 %   MCV 86.0 78.0 - 100.0 fL   MCH 29.6 26.0 - 34.0 pg   MCHC 34.5 30.0 - 36.0 g/dL   RDW 13.0 11.5 - 15.5 %   Platelets 203 150 - 400 K/uL  Creatinine, serum  Result Value Ref Range   Creatinine, Ser 0.68 0.50 - 1.35 mg/dL   GFR calc non Af Amer >90 >90 mL/min   GFR calc Af Amer >90 >90 mL/min  Protime-INR  Result Value Ref Range   Prothrombin Time 16.7 (H) 11.6 - 15.2 seconds   INR 1.34 0.00 - 1.49  CBC  Result Value Ref Range   WBC 10.3 4.0 - 10.5 K/uL   RBC 4.21 (L) 4.22 - 5.81 MIL/uL   Hemoglobin 12.1 (L) 13.0 - 17.0 g/dL   HCT 37.3 (L) 39.0 - 52.0 %   MCV 88.6 78.0 - 100.0 fL   MCH 28.7 26.0 - 34.0 pg   MCHC 32.4 30.0 - 36.0 g/dL   RDW 13.4 11.5 - 15.5 %   Platelets 206 150 - 400 K/uL  Basic metabolic panel  Result Value Ref Range   Sodium 139 137 - 147 mEq/L   Potassium 3.5 (L) 3.7 - 5.3 mEq/L   Chloride 99 96 - 112 mEq/L   CO2 30 19 - 32 mEq/L   Glucose, Bld 132 (H) 70 - 99 mg/dL   BUN 9 6 - 23 mg/dL   Creatinine, Ser 0.83 0.50 - 1.35 mg/dL   Calcium 8.2 (L) 8.4 - 10.5 mg/dL   GFR calc non Af Amer >90 >90 mL/min   GFR calc Af Amer >90 >90 mL/min   Anion gap 10 5 - 15  Protime-INR  Result Value Ref Range   Prothrombin Time 24.9 (H) 11.6 - 15.2 seconds   INR 2.23 (H) 0.00 - 1.49  CBC  Result Value Ref Range   WBC 9.8 4.0 - 10.5 K/uL   RBC 3.83 (L) 4.22 - 5.81 MIL/uL   Hemoglobin 11.5 (L) 13.0 - 17.0 g/dL   HCT 33.8 (L) 39.0 - 52.0 %   MCV 88.3 78.0 - 100.0 fL   MCH 30.0 26.0 - 34.0 pg   MCHC 34.0 30.0 - 36.0 g/dL   RDW 13.5 11.5 - 15.5 %   Platelets 182 150 -  400 K/uL  Protime-INR  Result Value Ref Range   Prothrombin Time 26.9 (H) 11.6 - 15.2 seconds   INR 2.46 (H) 0.00 - 1.49  CBC  Result Value Ref Range   WBC 9.6 4.0 - 10.5 K/uL   RBC 3.76 (L) 4.22 - 5.81 MIL/uL   Hemoglobin 10.9 (L) 13.0 - 17.0 g/dL   HCT 32.2 (L) 39.0 - 52.0 %   MCV 85.6 78.0 - 100.0 fL   MCH 29.0 26.0 - 34.0 pg   MCHC 33.9 30.0 - 36.0 g/dL   RDW 13.1 11.5 - 15.5 %   Platelets 197 150 - 400 K/uL    Discharge Medications:     Medication List    TAKE these medications        ascorbic acid 1000 MG tablet  Commonly known as:  VITAMIN C  Take 1,000 mg by mouth every morning.     aspirin EC 81 MG tablet  Take 81 mg by mouth daily.     atorvastatin 40 MG tablet  Commonly known as:  LIPITOR  Take 40 mg by mouth every evening.     hydrochlorothiazide 25 MG tablet  Commonly known as:  HYDRODIURIL  Take 1 tablet (25 mg total) by mouth daily.     ibuprofen 200 MG tablet  Commonly known as:  ADVIL,MOTRIN  Take 800 mg by mouth daily as needed for mild pain.     LEVOTHROID 25 MCG tablet  Generic drug:  levothyroxine  Take 25 mcg by mouth daily before breakfast.     levothyroxine 200 MCG tablet  Commonly known as:  SYNTHROID, LEVOTHROID  Take  200 mcg by mouth daily before breakfast.     losartan 50 MG tablet  Commonly known as:  COZAAR  Take 50 mg by mouth daily.     methocarbamol 500 MG tablet  Commonly known as:  ROBAXIN  Take 1 tablet (500 mg total) by mouth 3 (three) times daily as needed.     multivitamin tablet  Take 1 tablet by mouth every morning.     naproxen 500 MG tablet  Commonly known as:  NAPROSYN  Take 1 tablet (500 mg total) by mouth 2 (two) times daily.     omeprazole 20 MG capsule  Commonly known as:  PRILOSEC  Take 20 mg by mouth 2 (two) times daily before a meal.     oxyCODONE-acetaminophen 5-325 MG per tablet  Commonly known as:  ROXICET  Take 1-2 tablets by mouth every 4 (four) hours as needed for severe pain.     VITAMIN D PO  Take 2 capsules by mouth every morning.     warfarin 5 MG tablet  Commonly known as:  COUMADIN  Take 1 tablet (5 mg total) by mouth daily.        Diagnostic Studies: Dg Knee Left Port  11/27/2014   CLINICAL DATA:  Post LEFT knee replacement  EXAM: PORTABLE LEFT KNEE - 1-2 VIEW  COMPARISON:  Portable exam 1226 hr without priors for comparison.  FINDINGS: Components of LEFT knee prosthesis in expected positions.  No acute fracture, dislocation or bone destruction.  Non fused ossicle at tibial tubercle, normal variant.  Expected postsurgical soft tissue changes.  IMPRESSION: LEFT knee prosthesis without acute complication.   Electronically Signed   By: Lavonia Dana M.D.   On: 11/27/2014 13:56    Disposition: 01-Home or Self Care      Discharge Instructions    Call MD / Call 911  Complete by:  As directed   If you experience chest pain or shortness of breath, CALL 911 and be transported to the hospital emergency room.  If you develope a fever above 101 F, pus (white drainage) or increased drainage or redness at the wound, or calf pain, call your surgeon's office.     Constipation Prevention    Complete by:  As directed   Drink plenty of fluids.  Prune juice may be  helpful.  You may use a stool softener, such as Colace (over the counter) 100 mg twice a day.  Use MiraLax (over the counter) for constipation as needed.     Diet - low sodium heart healthy    Complete by:  As directed      Increase activity slowly as tolerated    Complete by:  As directed      Weight bearing as tolerated    Complete by:  As directed            Follow-up Information    Follow up with NORRIS,STEVEN R, MD. Call in 2 weeks.   Specialty:  Orthopedic Surgery   Why:  [phone]   Contact information:   [address] [place] [address] [phone]        Signed: Ventura Bruns 11/30/2014, 9:29 AM

## 2014-11-30 NOTE — Progress Notes (Signed)
Occupational Therapy Treatment Patient Details Name: Samuel Goodwin MRN: [id number] DOB: 1961-09-11 Today's Date: 11/30/2014    History of present illness 53 yo male s/p L TKA with KI WBAT PMH: Hep C, HLD, HTN, GERD< alcoholism sober since 1990, benign positional vertigo, OSA, throat surg x3    OT comments  Pt. Continues progressing with acute OT goals.  Able to complete toileting tasks and simulated shower transfer with multiple turns and sit/stands with s/min guard a.  Wife present and reports she will assist with LB adls as needed so declined a/e at this time.  Wife and pt. Still plan on moving forward with short snf stay for continued therapies prior to d/c home.    Follow Up Recommendations  SNF;Supervision/Assistance - 24 hour    Equipment Recommendations  Tub/shower bench;3 in 1 bedside comode    Recommendations for Other Services      Precautions / Restrictions Precautions Precautions: Knee Required Braces or Orthoses: Knee Immobilizer - Left Knee Immobilizer - Left: Other (comment) (until d/c by MD. ) Restrictions Weight Bearing Restrictions: Yes LLE Weight Bearing: Weight bearing as tolerated       Mobility Bed Mobility               General bed mobility comments: up in recliner upon arrival  Transfers Overall transfer level: Needs assistance Equipment used: Rolling walker (2 wheeled) Transfers: Sit to/from Stand Sit to Stand: Supervision         General transfer comment: Supervision for safety.  Pt able to demonstrate correct hand placement without cues.     Balance Overall balance assessment: Needs assistance Sitting-balance support: Feet supported;No upper extremity supported Sitting balance-Leahy Scale: Good     Standing balance support: Bilateral upper extremity supported;Single extremity supported;No upper extremity supported Standing balance-Leahy Scale: Fair                     ADL Overall ADL's : Needs assistance/impaired      Grooming: Min guard Grooming Details (indicate cue type and reason): simulated in standing at counter in room Upper Body Bathing: Set up;Sitting   Lower Body Bathing: Moderate assistance;Sit to/from stand Lower Body Bathing Details (indicate cue type and reason): wife present and states no a/e needed as she is "available to help anyway i am needed" Upper Body Dressing : Set up;Sitting     Lower Body Dressing Details (indicate cue type and reason): wife present and states no a/e needed as she is "available to help anyway i am needed" Toilet Transfer: Minimal assistance;Ambulation Toilet Transfer Details (indicate cue type and reason): simulated during multiple transfers and mobility in room during session      Tub/ Shower Transfer: Walk-in shower;Minimal assistance;Cueing for safety;Cueing for sequencing;Anterior/posterior;Ambulation;Shower Technical sales engineer Details (indicate cue type and reason): pt. and wife provided a drawing to re-create b.room setup due to narrow doorways and need for side stepping and turning to peform shower stall tranfer by stepping backwards over small ledge to large walk in shower.  room set up to simulate drawing and pt. able to side step in/out of main door way and amb. to shower and complete. Functional mobility during ADLs: Minimal assistance;Cueing for safety;Cueing for sequencing;Rolling walker General ADL Comments: wife declines a/e at this time      Vision                     Perception     Praxis      Cognition  Behavior During Therapy: WFL for tasks assessed/performed Overall Cognitive Status: Within Functional Limits for tasks assessed                       Extremity/Trunk Assessment               Exercises     Shoulder Instructions       General Comments      Pertinent Vitals/ Pain       Pain Assessment:  (did not rate pain, c/o sweating and need for B.M.) Pain Score: 5  Pain Location:  left knee, low back, right hip and right knee.  Pain Descriptors / Indicators: Aching;Burning Pain Intervention(s): Limited activity within patient's tolerance;Monitored during session;Repositioned;Premedicated before session  Home Living                                          Prior Functioning/Environment              Frequency Min 2X/week     Progress Toward Goals  OT Goals(current goals can now be found in the care plan section)  Progress towards OT goals: Progressing toward goals  Acute Rehab OT Goals Patient Stated Goal: To go to rehab prior to d/c home  Plan Discharge plan remains appropriate    Co-evaluation                 End of Session Equipment Utilized During Treatment: Rolling walker;Left knee immobilizer CPM Left Knee CPM Left Knee: Off Left Knee Flexion (Degrees): 45 Left Knee Extension (Degrees): 0   Activity Tolerance Patient tolerated treatment well   Patient Left in chair;with call bell/phone within reach;with family/visitor present   Nurse Communication          Time: 2336-1224 OT Time Calculation (min): 19 min  Charges: OT General Charges $OT Visit: 1 Procedure OT Treatments $Self Care/Home Management : 8-22 mins  Janice Coffin, COTA/L 11/30/2014, 10:55 AM

## 2014-11-30 NOTE — Progress Notes (Addendum)
   Subjective: 3 Days Post-Op Procedure(s) (LRB): LEFT TOTAL KNEE ARTHROPLASTY (Left)  Lumbar back pain has improved Mild to moderate pain in the knee worse with movement but otherwise stable Patient reports pain as moderate.  Objective:   VITALS:   Filed Vitals:   11/30/14 0800  BP:   Pulse:   Temp:   Resp: 18    Left knee incision healing well nv intact distally No rashes or edema  LABS  Recent Labs  11/28/14 0530 11/29/14 0345 11/30/14 0535  HGB 12.1* 11.5* 10.9*  HCT 37.3* 33.8* 32.2*  WBC 10.3 9.8 9.6  PLT 206 182 197     Recent Labs  11/27/14 1429 11/28/14 0530  NA  --  139  K  --  3.5*  BUN  --  9  CREATININE 0.68 0.83  GLUCOSE  --  132*     Assessment/Plan: 3 Days Post-Op Procedure(s) (LRB): LEFT TOTAL KNEE ARTHROPLASTY (Left) D/c to rehab today F/u in 2 weeks Patient in agreement     [place], PA-C  11/30/2014, 9:28 AM  I have examined the patient and agree with the above assessment and plan. Patient going to rehab this afternoon.  Doing very well at this point.  Augustin Schooling MD

## 2014-12-01 ENCOUNTER — Non-Acute Institutional Stay (SKILLED_NURSING_FACILITY): Payer: Managed Care, Other (non HMO) | Admitting: Adult Health

## 2014-12-01 ENCOUNTER — Encounter (HOSPITAL_COMMUNITY): Payer: Self-pay | Admitting: Orthopedic Surgery

## 2014-12-01 DIAGNOSIS — I10 Essential (primary) hypertension: Secondary | ICD-10-CM

## 2014-12-01 DIAGNOSIS — Z7901 Long term (current) use of anticoagulants: Secondary | ICD-10-CM

## 2014-12-01 DIAGNOSIS — K219 Gastro-esophageal reflux disease without esophagitis: Secondary | ICD-10-CM

## 2014-12-01 DIAGNOSIS — Z96652 Presence of left artificial knee joint: Secondary | ICD-10-CM

## 2014-12-01 DIAGNOSIS — E039 Hypothyroidism, unspecified: Secondary | ICD-10-CM

## 2014-12-01 DIAGNOSIS — D62 Acute posthemorrhagic anemia: Secondary | ICD-10-CM

## 2014-12-01 NOTE — Progress Notes (Signed)
Patient ID: Samuel Goodwin, male   DOB: 1961-09-30, 53 y.o.   MRN: [id number]   12/01/2014  Facility:  Nursing Home Location:  [place] Room Number: 1206-P LEVEL OF CARE:  SNF (31)  Chief Complaint  Patient presents with  . Hospitalization Follow-up    Osteoarthritis S/P left total knee arthroplasty, hypertension, hypothyroidism, GERD and anemia    HISTORY OF PRESENT ILLNESS:  This is a 53 year old male who has been admitted to [facility] on 11/30/14 from [facility] with Osteoarthritis S/P left total knee arthroplasty. His past medical history is significant for hypertension, hypothyroidism and Hepatitis C. He has been admitted for a short-term rehabilitation.  PAST MEDICAL HISTORY:  Past Medical History  Diagnosis Date  . Hyperlipidemia   . Hypertension   . Hypothyroidism   . GERD (gastroesophageal reflux disease)   . Alcohol abuse     Sobriety since 1990  . Benign positional vertigo   . OSA (obstructive sleep apnea)     on CPAP  . Anxiety   . Depression   . PTSD (post-traumatic stress disorder)   . Arthritis   . Hepatitis C 1994    hep c - ? etiology; in [place] 1989-91; S/P interferon , Dr [name]    CURRENT MEDICATIONS: Reviewed per MAR/see medication list  Allergies  Allergen Reactions  . Benazepril Hcl Cough     REVIEW OF SYSTEMS:  GENERAL: no change in appetite, no fatigue, no weight changes, no fever, chills or weakness RESPIRATORY: no cough, SOB, DOE, wheezing, hemoptysis CARDIAC: no chest pain, edema or palpitations GI: no abdominal pain, diarrhea, constipation, heart burn, nausea or vomiting  PHYSICAL EXAMINATION  GENERAL: no acute distress, obese EYES: conjunctivae normal, sclerae normal, normal eye lids NECK: supple, trachea midline, no neck masses, no thyroid tenderness, no thyromegaly LYMPHATICS: no LAN in the neck, no supraclavicular LAN RESPIRATORY: breathing is even & unlabored, BS CTAB CARDIAC: RRR,  no murmur,no extra heart sounds, LLE edema 1+ GI: abdomen soft, normal BS, no masses, no tenderness, no hepatomegaly, no splenomegaly EXTREMITIES: Able to move all 4 extremities; has left leg immobilizer PSYCHIATRIC: the patient is alert & oriented to person, affect & behavior appropriate  LABS/RADIOLOGY: Labs reviewed: Basic Metabolic Panel:  Recent Labs  11/19/14 0838 11/27/14 1429 11/28/14 0530  NA 141  --  139  K 4.0  --  3.5*  CL 101  --  99  CO2 28  --  30  GLUCOSE 87  --  132*  BUN 11  --  9  CREATININE 0.71 0.68 0.83  CALCIUM 9.3  --  8.2*   Liver Function Tests:  Recent Labs  11/03/14 0800 11/19/14 0838  AST 13 13  ALT 19 17  ALKPHOS 59 77  BILITOT 0.5 0.5  PROT 5.7* 7.1  ALBUMIN 3.6 3.8   CBC:  Recent Labs  11/28/14 0530 11/29/14 0345 11/30/14 0535  WBC 10.3 9.8 9.6  HGB 12.1* 11.5* 10.9*  HCT 37.3* 33.8* 32.2*  MCV 88.6 88.3 85.6  PLT 206 182 197   Lipid Panel:  Recent Labs  11/03/14 0800  HDL 44   Dg Knee Left Port  11/27/2014   CLINICAL DATA:  Post LEFT knee replacement  EXAM: PORTABLE LEFT KNEE - 1-2 VIEW  COMPARISON:  Portable exam 1226 hr without priors for comparison.  FINDINGS: Components of LEFT knee prosthesis in expected positions.  No acute fracture, dislocation or bone destruction.  Non fused ossicle at tibial  tubercle, normal variant.  Expected postsurgical soft tissue changes.  IMPRESSION: LEFT knee prosthesis without acute complication.   Electronically Signed   By: Lavonia Dana M.D.   On: 11/27/2014 13:56    ASSESSMENT/PLAN:  Osteoarthritis S/P left total knee arthroplasty - for rehabilitation; continue Percocet 5/325 mg 1-2 tabs by mouth every 4 hours when necessary for pain management; Robaxin 500 mg 1 tab by mouth 3 times a day when necessary for muscle spasm; and Coumadin for DVT prophylaxis Long-term use of anticoagulant - INR 3.5 - supratherapeutic; hold Coumadin 1 day and check INR on 12/02/14 Hypertension - well  controlled; continue HCTZ 25 mg 1 tab by mouth daily and losartan 50 mg by mouth daily Hypothyroidism - continue Levothyroid 25 g and 200 g  = 225 mcg daily GERD - continue Prilosec 20 mg by mouth twice a day Anemia, acute blood loss - hemoglobin 10.9; stable   Goals of care:  Short-term rehabilitation    Labs/test ordered:  CBC and CMP   Spent 50 minutes in patient care.     [facility], NP [employer] [phone]

## 2014-12-02 ENCOUNTER — Encounter: Payer: Self-pay | Admitting: Internal Medicine

## 2014-12-02 ENCOUNTER — Non-Acute Institutional Stay (SKILLED_NURSING_FACILITY): Payer: Managed Care, Other (non HMO) | Admitting: Internal Medicine

## 2014-12-02 DIAGNOSIS — E039 Hypothyroidism, unspecified: Secondary | ICD-10-CM

## 2014-12-02 DIAGNOSIS — K5900 Constipation, unspecified: Secondary | ICD-10-CM

## 2014-12-02 DIAGNOSIS — K219 Gastro-esophageal reflux disease without esophagitis: Secondary | ICD-10-CM

## 2014-12-02 DIAGNOSIS — D62 Acute posthemorrhagic anemia: Secondary | ICD-10-CM

## 2014-12-02 DIAGNOSIS — I10 Essential (primary) hypertension: Secondary | ICD-10-CM

## 2014-12-02 DIAGNOSIS — M1712 Unilateral primary osteoarthritis, left knee: Secondary | ICD-10-CM

## 2014-12-02 NOTE — Progress Notes (Signed)
Patient ID: Samuel Goodwin, male   DOB: 1961-08-20, 53 y.o.   MRN: [id number]     [facility]   PCP: [name], MD  Code Status: full code  Allergies  Allergen Reactions  . Benazepril Hcl Cough    Chief Complaint  Patient presents with  . New Admit To SNF     HPI:  54 y/o male pt is here for STR post hospital admission 11/27/14-11/30/14 with severe left knee OA. He underwent left knee arthroplasty. He is seen in his room with his daughter present. He denies any concerns. Pain under control.   Review of Systems:  Constitutional: Negative for fever, chills, malaise/fatigue and diaphoresis.  HENT: Negative for congestion Eyes: Negative for eye pain, blurred vision, double vision and discharge.  Respiratory: Negative for cough, sputum production, shortness of breath and wheezing.   Cardiovascular: Negative for chest pain, palpitations, orthopnea and leg swelling.  Gastrointestinal: Negative for heartburn, nausea, vomiting, abdominal pain Genitourinary: Negative for dysuria, urgency, frequency, flank pain.  Musculoskeletal: Negative for back pain, falls Skin: Negative for itching, rash.  Neurological: Negative for generalized weakness,dizziness, tingling, focal weakness and headaches.  Psychiatric/Behavioral: Negative for depression  Past Medical History  Diagnosis Date  . Hyperlipidemia   . Hypertension   . Hypothyroidism   . GERD (gastroesophageal reflux disease)   . Alcohol abuse     Sobriety since 1990  . Benign positional vertigo   . OSA (obstructive sleep apnea)     on CPAP  . Anxiety   . Depression   . PTSD (post-traumatic stress disorder)   . Arthritis   . Hepatitis C 1994    hep c - ? etiology; in [place] 1989-91; S/P interferon , Dr [name]   Past Surgical History  Procedure Laterality Date  . Throat surgery      vocal cord  laser sugery X 3, Dr [name] , [facility]  . Upper gastrointestinal endoscopy  2010    Negative,  Dr.[name]  . Laryngoscopy  08/2009    Dr.[name]  . Cardiovascular stress test  02/09/2010    No scintigraphic evidence of inducible ischemia.  . Transthoracic echocardiogram  11/08/2005    EF 68%, normal LV systolic function  . Knee arthroscopic Bilateral   . Total knee arthroplasty Left 11/27/2014    dr [name]  . Total knee arthroplasty Left 11/27/2014    Procedure: LEFT TOTAL KNEE ARTHROPLASTY;  Surgeon: Augustin Schooling, MD;  Location: [place];  Service: Orthopedics;  Laterality: Left;   Social History:   reports that he quit smoking about 36 years ago. He has never used smokeless tobacco. He reports that he does not drink alcohol or use illicit drugs.  Family History  Problem Relation Age of Onset  . Cirrhosis Father   . Diabetes Father   . Stroke Father   . Heart failure Mother   . Hypertension Mother   . Heart attack Brother 48  . Aneurysm Maternal Aunt      AAA  . Coronary artery disease Maternal Uncle     MI late 52s  . Thyroid disease Mother     Medications: Patient's Medications  New Prescriptions   No medications on file  Previous Medications   ASCORBIC ACID (VITAMIN C) 1000 MG TABLET    Take 1,000 mg by mouth every morning.   ASPIRIN EC 81 MG TABLET    Take 81 mg by mouth daily.   ATORVASTATIN (LIPITOR) 40 MG TABLET  Take 40 mg by mouth every evening.    CHOLECALCIFEROL (VITAMIN D PO)    Take 2 capsules by mouth every morning.   HYDROCHLOROTHIAZIDE (HYDRODIURIL) 25 MG TABLET    Take 1 tablet (25 mg total) by mouth daily.   IBUPROFEN (ADVIL,MOTRIN) 200 MG TABLET    Take 800 mg by mouth daily as needed for mild pain.   LEVOTHYROXINE (LEVOTHROID) 25 MCG TABLET    Take 25 mcg by mouth daily before breakfast.   LEVOTHYROXINE (SYNTHROID, LEVOTHROID) 200 MCG TABLET    Take 200 mcg by mouth daily before breakfast.   LOSARTAN (COZAAR) 50 MG TABLET    Take 50 mg by mouth daily.   METHOCARBAMOL (ROBAXIN) 500 MG TABLET    Take 1 tablet (500 mg total) by mouth 3 (three) times  daily as needed.   MULTIPLE VITAMIN (MULTIVITAMIN) TABLET    Take 1 tablet by mouth every morning.    NAPROXEN (NAPROSYN) 500 MG TABLET    Take 1 tablet (500 mg total) by mouth 2 (two) times daily.   OMEPRAZOLE (PRILOSEC) 20 MG CAPSULE    Take 20 mg by mouth 2 (two) times daily before a meal.    OXYCODONE-ACETAMINOPHEN (ROXICET) 5-325 MG PER TABLET    Take 1-2 tablets by mouth every 4 (four) hours as needed for severe pain.   WARFARIN (COUMADIN) 5 MG TABLET    Take 1 tablet (5 mg total) by mouth daily.  Modified Medications   No medications on file  Discontinued Medications   No medications on file     Physical Exam: Filed Vitals:   12/02/14 1611  BP: 150/83  Pulse: 88  Temp: 99.6 F (37.6 C)  Resp: 18  SpO2: 93%    General- adult male in no acute distress Head- atraumatic, normocephalic Eyes- PERRLA, EOMI, no pallor, no icterus, no discharge Neck- no cervical lymphadenopathy Cardiovascular- normal s1,s2, no murmurs/ rubs/ gallops, dorsalis pedis Respiratory- bilateral clear to auscultation, no wheeze, no rhonchi, no crackles, no use of accessory muscles Abdomen- bowel sounds present, soft, non tender Musculoskeletal- able to move all 4 extremities, trace left leg edema Neurological- no focal deficit Skin- warm and dry, left knee dressing in place, clean and dry Psychiatry- alert and oriented to person, place and time, normal mood and affect    Labs reviewed: Basic Metabolic Panel:  Recent Labs  11/19/14 0838 11/27/14 1429 11/28/14 0530  NA 141  --  139  K 4.0  --  3.5*  CL 101  --  99  CO2 28  --  30  GLUCOSE 87  --  132*  BUN 11  --  9  CREATININE 0.71 0.68 0.83  CALCIUM 9.3  --  8.2*   Liver Function Tests:  Recent Labs  11/03/14 0800 11/19/14 0838  AST 13 13  ALT 19 17  ALKPHOS 59 77  BILITOT 0.5 0.5  PROT 5.7* 7.1  ALBUMIN 3.6 3.8   No results for input(s): LIPASE, AMYLASE in the last 8760 hours. No results for input(s): AMMONIA in the last  8760 hours. CBC:  Recent Labs  11/28/14 0530 11/29/14 0345 11/30/14 0535  WBC 10.3 9.8 9.6  HGB 12.1* 11.5* 10.9*  HCT 37.3* 33.8* 32.2*  MCV 88.6 88.3 85.6  PLT 206 182 197    Assessment/Plan  Left knee Osteoarthritis  S/P left total knee arthroplasty. continue Percocet 5/325 mg 1-2 tabs by mouth every 4 hours when necessary for pain management; Robaxin 500 mg 1 tab by mouth  3 times a day when necessary for muscle spasm and Coumadin for DVT prophylaxis. Monitor inr. Has f/u with orthopedics. Will have patient work with PT/OT as tolerated to regain strength and restore function.  Fall precautions are in place. WBAT  Anemia From blood, loss, monitor h&h  Hypertension With bp readings elevated, continue HCTZ 25 mg daily and increase losartan to 75 mg by mouth daily. Monitor bp readings  Hypothyroidism continue Levothyroxine 225 mcg daily   GERD continue Prilosec 20 mg bid, symptoms controlled  constipation Continue colace 100 mg bid for now  Family/ staff Communication: reviewed care plan with patient and nursing supervisor  Goals of care: short term rehabilitation    Labs/tests ordered: cbc, bmp in 1 week    Blanchie Serve, MD  [place] [phone] (Monday-Friday 8 am - 5 pm) [phone] (afterhours)

## 2014-12-07 NOTE — Anesthesia Postprocedure Evaluation (Signed)
Anesthesia Post Note  Patient: Samuel Goodwin  Procedure(s) Performed: Procedure(s) (LRB): LEFT TOTAL KNEE ARTHROPLASTY (Left)  Anesthesia type: General  Patient location: PACU  Post pain: Pain level controlled and Adequate analgesia  Post assessment: Post-op Vital signs reviewed, Patient's Cardiovascular Status Stable, Respiratory Function Stable, Patent Airway and Pain level controlled  Last Vitals:  Filed Vitals:   11/30/14 1258  BP: 140/80  Pulse: 94  Temp: 37 C  Resp: 18    Post vital signs: Reviewed and stable  Level of consciousness: awake, alert  and oriented  Complications: No apparent anesthesia complications

## 2014-12-14 ENCOUNTER — Encounter: Payer: Self-pay | Admitting: Adult Health

## 2014-12-14 ENCOUNTER — Non-Acute Institutional Stay (SKILLED_NURSING_FACILITY): Payer: Managed Care, Other (non HMO) | Admitting: Adult Health

## 2014-12-14 DIAGNOSIS — K219 Gastro-esophageal reflux disease without esophagitis: Secondary | ICD-10-CM

## 2014-12-14 DIAGNOSIS — Z96652 Presence of left artificial knee joint: Secondary | ICD-10-CM

## 2014-12-14 DIAGNOSIS — K5900 Constipation, unspecified: Secondary | ICD-10-CM

## 2014-12-14 DIAGNOSIS — E039 Hypothyroidism, unspecified: Secondary | ICD-10-CM

## 2014-12-14 DIAGNOSIS — M1712 Unilateral primary osteoarthritis, left knee: Secondary | ICD-10-CM

## 2014-12-14 DIAGNOSIS — I10 Essential (primary) hypertension: Secondary | ICD-10-CM

## 2014-12-14 DIAGNOSIS — D62 Acute posthemorrhagic anemia: Secondary | ICD-10-CM

## 2014-12-14 NOTE — Progress Notes (Signed)
Patient ID: Samuel Goodwin, male   DOB: 18-Aug-1961, 54 y.o.   MRN: [id number]   12/14/2014  Facility:  Nursing Home Location:  [place] Room Number: 1206-P LEVEL OF CARE:  SNF (31)  Chief Complaint  Patient presents with  . Discharge Notes    Osteoarthritis S/P left total knee arthroplasty, hypertension, hypothyroidism, GERD and anemia    HISTORY OF PRESENT ILLNESS:  This is a 54 year old male who is for discharge home and will have outpatient rehabilitation. DME:  Rolling walker, 3-in-1 commode, Polar ice and reacher. He has been admitted to [facility] on 11/30/14 from [facility] with Osteoarthritis S/P left total knee arthroplasty. His past medical history is significant for hypertension, hypothyroidism and Hepatitis C. Patient was admitted to this facility for short-term rehabilitation after the patient's recent hospitalization.  Patient has completed SNF rehabilitation and therapy has cleared the patient for discharge.  Patient was admitted to this facility for short-term rehabilitation after the patient's recent hospitalization.  Patient has completed SNF rehabilitation and therapy has cleared the patient for discharge.  PAST MEDICAL HISTORY:  Past Medical History  Diagnosis Date  . Hyperlipidemia   . Hypertension   . Hypothyroidism   . GERD (gastroesophageal reflux disease)   . Alcohol abuse     Sobriety since 1990  . Benign positional vertigo   . OSA (obstructive sleep apnea)     on CPAP  . Anxiety   . Depression   . PTSD (post-traumatic stress disorder)   . Arthritis   . Hepatitis C 1994    hep c - ? etiology; in [place] 1989-91; S/P interferon , Dr [name]    CURRENT MEDICATIONS: Reviewed per MAR/see medication list  Allergies  Allergen Reactions  . Benazepril Hcl Cough     REVIEW OF SYSTEMS:  GENERAL: no change in appetite, no fatigue, no weight changes, no fever, chills or weakness RESPIRATORY: no cough, SOB, DOE,  wheezing, hemoptysis CARDIAC: no chest pain, edema or palpitations GI: no abdominal pain, diarrhea, constipation, heart burn, nausea or vomiting  PHYSICAL EXAMINATION  GENERAL: no acute distress, obese NECK: supple, trachea midline, no neck masses, no thyroid tenderness, no thyromegaly LYMPHATICS: no LAN in the neck, no supraclavicular LAN RESPIRATORY: breathing is even & unlabored, BS CTAB CARDIAC: RRR, no murmur,no extra heart sounds, LLE edema 1+ GI: abdomen soft, normal BS, no masses, no tenderness, no hepatomegaly, no splenomegaly EXTREMITIES: Able to move all 4 extremities; uses walker when ambulating PSYCHIATRIC: the patient is alert & oriented to person, affect & behavior appropriate  LABS/RADIOLOGY: 12/07/14   WBC 6.1 hemoglobin 10.7 hematocrit 33.1 MCV 90.2 sodium 138 potassium 3.8 glucose 90 BUN 11 creatinine 0.7 calcium 8.3 total protein 5.6 albumin 3.3 ALP 49 AST 16 ALT is 16 Labs reviewed: Basic Metabolic Panel:  Recent Labs  11/19/14 0838 11/27/14 1429 11/28/14 0530  NA 141  --  139  K 4.0  --  3.5*  CL 101  --  99  CO2 28  --  30  GLUCOSE 87  --  132*  BUN 11  --  9  CREATININE 0.71 0.68 0.83  CALCIUM 9.3  --  8.2*   Liver Function Tests:  Recent Labs  11/03/14 0800 11/19/14 0838  AST 13 13  ALT 19 17  ALKPHOS 59 77  BILITOT 0.5 0.5  PROT 5.7* 7.1  ALBUMIN 3.6 3.8   CBC:  Recent Labs  11/28/14 0530 11/29/14 0345 11/30/14 0535  WBC  10.3 9.8 9.6  HGB 12.1* 11.5* 10.9*  HCT 37.3* 33.8* 32.2*  MCV 88.6 88.3 85.6  PLT 206 182 197   Lipid Panel:  Recent Labs  11/03/14 0800  HDL 44   Dg Knee Left Port  11/27/2014   CLINICAL DATA:  Post LEFT knee replacement  EXAM: PORTABLE LEFT KNEE - 1-2 VIEW  COMPARISON:  Portable exam 1226 hr without priors for comparison.  FINDINGS: Components of LEFT knee prosthesis in expected positions.  No acute fracture, dislocation or bone destruction.  Non fused ossicle at tibial tubercle, normal variant.   Expected postsurgical soft tissue changes.  IMPRESSION: LEFT knee prosthesis without acute complication.   Electronically Signed   By: Lavonia Dana M.D.   On: 11/27/2014 13:56    ASSESSMENT/PLAN:  Osteoarthritis S/P left total knee arthroplasty - for outpatient rehabilitation; continue Percocet 5/325 mg 1-2 tabs by mouth every 4 hours when necessary for pain management; Robaxin 500 mg 1 tab by mouth 3 times a day when necessary for muscle spasm; and Coumadin for DVT prophylaxis Hypertension - well controlled; continue HCTZ 25 mg 1 tab by mouth daily and losartan 50 mg by mouth daily Hypothyroidism - continue Levothyroid 25 g and 200 g  = 225 mcg daily GERD - continue Prilosec 20 mg by mouth twice a day Anemia, acute blood loss - hemoglobin 10.7; stable    I have filled out patient's discharge paperwork and written prescriptions.  Patient will go for outpatient rehabilitation.  DME provided:  Rolling walker, 3-in-1 commode, Polar ice and reacher  Total discharge time: Greater than 30 minutes  Discharge time involved coordination of the discharge process with Education officer, museum, nursing staff and therapy department. Medical justification for DME verified.     [facility], NP [employer] [phone]

## 2015-05-11 ENCOUNTER — Encounter: Payer: Self-pay | Admitting: Internal Medicine

## 2016-01-20 ENCOUNTER — Encounter (HOSPITAL_COMMUNITY)
Admission: RE | Admit: 2016-01-20 | Discharge: 2016-01-20 | Disposition: A | Discharge status: Home / Self Care | Payer: Managed Care, Other (non HMO) | Source: Ambulatory Visit | Attending: Orthopedic Surgery | Admitting: Orthopedic Surgery

## 2016-01-20 ENCOUNTER — Encounter (HOSPITAL_COMMUNITY): Payer: Self-pay

## 2016-01-20 DIAGNOSIS — Z01812 Encounter for preprocedural laboratory examination: Secondary | ICD-10-CM | POA: Insufficient documentation

## 2016-01-20 DIAGNOSIS — Z0183 Encounter for blood typing: Secondary | ICD-10-CM | POA: Diagnosis not present

## 2016-01-20 DIAGNOSIS — M1611 Unilateral primary osteoarthritis, right hip: Secondary | ICD-10-CM | POA: Diagnosis not present

## 2016-01-20 HISTORY — DX: Personal history of other diseases of the respiratory system: Z87.09

## 2016-01-20 HISTORY — DX: Unspecified injury of unspecified middle and inner ear, initial encounter: S09.309A

## 2016-01-20 HISTORY — DX: Other specified postprocedural states: Z98.890

## 2016-01-20 HISTORY — DX: Nausea with vomiting, unspecified: R11.2

## 2016-01-20 HISTORY — DX: Polyp of vocal cord and larynx: J38.1

## 2016-01-20 HISTORY — DX: Dysphonia: R49.0

## 2016-01-20 HISTORY — DX: Headache, unspecified: R51.9

## 2016-01-20 HISTORY — DX: Headache: R51

## 2016-01-20 LAB — TYPE AND SCREEN
ABO/RH(D): O POS
Antibody Screen: NEGATIVE

## 2016-01-20 LAB — SURGICAL PCR SCREEN
MRSA, PCR: NEGATIVE
STAPHYLOCOCCUS AUREUS: NEGATIVE

## 2016-01-20 LAB — URINALYSIS, ROUTINE W REFLEX MICROSCOPIC
Bilirubin Urine: NEGATIVE
Glucose, UA: NEGATIVE mg/dL
Hgb urine dipstick: NEGATIVE
KETONES UR: NEGATIVE mg/dL
LEUKOCYTES UA: NEGATIVE
NITRITE: NEGATIVE
PH: 7.5 (ref 5.0–8.0)
PROTEIN: NEGATIVE mg/dL
Specific Gravity, Urine: 1.026 (ref 1.005–1.030)

## 2016-01-20 LAB — PROTIME-INR
INR: 1.13 (ref 0.00–1.49)
PROTHROMBIN TIME: 14.2 s (ref 11.6–15.2)

## 2016-01-20 LAB — BASIC METABOLIC PANEL
Anion gap: 11 (ref 5–15)
BUN: 14 mg/dL (ref 6–20)
CALCIUM: 9 mg/dL (ref 8.9–10.3)
CO2: 27 mmol/L (ref 22–32)
CREATININE: 0.8 mg/dL (ref 0.61–1.24)
Chloride: 100 mmol/L — ABNORMAL LOW (ref 101–111)
GFR calc Af Amer: >60 mL/min (ref >60)
GFR calc non Af Amer: >60 mL/min (ref >60)
GLUCOSE: 89 mg/dL (ref 65–99)
Potassium: 3.7 mmol/L (ref 3.5–5.1)
Sodium: 138 mmol/L (ref 135–145)

## 2016-01-20 LAB — CBC
HCT: 41.5 % (ref 39.0–52.0)
HEMOGLOBIN: 13.8 g/dL (ref 13.0–17.0)
MCH: 29.3 pg (ref 26.0–34.0)
MCHC: 33.3 g/dL (ref 30.0–36.0)
MCV: 88.1 fL (ref 78.0–100.0)
PLATELETS: 275 10*3/uL (ref 150–400)
RBC: 4.71 MIL/uL (ref 4.22–5.81)
RDW: 13.2 % (ref 11.5–15.5)
WBC: 8.1 10*3/uL (ref 4.0–10.5)

## 2016-01-20 LAB — ABO/RH: ABO/RH(D): O POS

## 2016-01-20 LAB — APTT: aPTT: 29 seconds (ref 24–37)

## 2016-01-20 NOTE — Progress Notes (Signed)
Spoke with Sherry/scheduler for Dr Alvan Dame in regards to pt stating surgery is for right hip not left as noted per orders.

## 2016-01-20 NOTE — Patient Instructions (Signed)
Samuel Goodwin  01/20/2016   Your procedure is scheduled on: Tuesday February 01, 2016  Report to [facility] Main  Entrance take [place]  elevators to 3rd floor to  [place] at 12:45 PM.  Call this number if you have problems the morning of surgery [phone]   Remember: ONLY 1 PERSON MAY GO WITH YOU TO SHORT STAY TO GET  READY MORNING OF [name].  Do not eat food After Midnight but may take clear liquids till 9:45 am day of surgery then nothing by mouth.     Take these medicines the morning of surgery with A SIP OF WATER:Levothyroxine; Omeprazole (Prilosec)                                You may not have any metal on your body including hair pins and              piercings  Do not wear jewelry, lotions, powders or colognes, deodorant             MEN MAY SHAVE FACE AND NECK MORNING OF SURGERY.   Do not bring valuables to the hospital. [name].  Contacts, dentures or bridgework may not be worn into surgery.  Leave suitcase in the car. After surgery it may be brought to your room.                Please read over the following fact sheets you were given:MRSA INFORMATION SHEET; INCENTIVE SPIROMETER; BLOOD TRANSFUSION INFORMATION SHEET  _____________________________________________________________________             [facility] - Preparing for Surgery Before surgery, you can play an important role.  Because skin is not sterile, your skin needs to be as free of germs as possible.  You can reduce the number of germs on your skin by washing with CHG (chlorahexidine gluconate) soap before surgery.  CHG is an antiseptic cleaner which kills germs and bonds with the skin to continue killing germs even after washing. Please DO NOT use if you have an allergy to CHG or antibacterial soaps.  If your skin becomes reddened/irritated stop using the CHG and inform your nurse when you arrive at Short Stay. Do not  shave (including legs and underarms) for at least 48 hours prior to the first CHG shower.  You may shave your face/neck. Please follow these instructions carefully:  1.  Shower with CHG Soap the night before surgery and the  morning of Surgery.  2.  If you choose to wash your hair, wash your hair first as usual with your  normal  shampoo.  3.  After you shampoo, rinse your hair and body thoroughly to remove the  shampoo.                           4.  Use CHG as you would any other liquid soap.  You can apply chg directly  to the skin and wash                       Gently with a scrungie or clean washcloth.  5.  Apply the CHG Soap to your body  ONLY FROM THE NECK DOWN.   Do not use on face/ open                           Wound or open sores. Avoid contact with eyes, ears mouth and genitals (private parts).                       Wash face,  Genitals (private parts) with your normal soap.             6.  Wash thoroughly, paying special attention to the area where your surgery  will be performed.  7.  Thoroughly rinse your body with warm water from the neck down.  8.  DO NOT shower/wash with your normal soap after using and rinsing off  the CHG Soap.                9.  Pat yourself dry with a clean towel.            10.  Wear clean pajamas.            11.  Place clean sheets on your bed the night of your first shower and do not  sleep with pets. Day of Surgery : Do not apply any lotions/deodorants the morning of surgery.  Please wear clean clothes to the hospital/surgery center.  FAILURE TO FOLLOW THESE INSTRUCTIONS MAY RESULT IN THE CANCELLATION OF YOUR SURGERY PATIENT SIGNATURE_________________________________  NURSE SIGNATURE__________________________________  ________________________________________________________________________    CLEAR LIQUID DIET   Foods Allowed                                                                     Foods Excluded  Coffee and tea, regular and decaf                              liquids that you cannot  Plain Jell-O in any flavor                                             see through such as: Fruit ices (not with fruit pulp)                                     milk, soups, orange juice  Iced Popsicles                                    All solid food Carbonated beverages, regular and diet                                    Cranberry, grape and apple juices Sports drinks like Gatorade Lightly seasoned clear broth or consume(fat free) Sugar, honey syrup  Sample Menu Breakfast  Lunch                                     Supper Cranberry juice                    Beef broth                            Chicken broth Jell-O                                     Grape juice                           Apple juice Coffee or tea                        Jell-O                                      Popsicle                                                Coffee or tea                        Coffee or tea  _____________________________________________________________________    Incentive Spirometer  An incentive spirometer is a tool that can help keep your lungs clear and active. This tool measures how well you are filling your lungs with each breath. Taking long deep breaths may help reverse or decrease the chance of developing breathing (pulmonary) problems (especially infection) following:  A long period of time when you are unable to move or be active. BEFORE THE PROCEDURE   If the spirometer includes an indicator to show your best effort, your nurse or respiratory therapist will set it to a desired goal.  If possible, sit up straight or lean slightly forward. Try not to slouch.  Hold the incentive spirometer in an upright position. INSTRUCTIONS FOR USE   Sit on the edge of your bed if possible, or sit up as far as you can in bed or on a chair.  Hold the incentive spirometer in an upright position.  Breathe out  normally.  Place the mouthpiece in your mouth and seal your lips tightly around it.  Breathe in slowly and as deeply as possible, raising the piston or the ball toward the top of the column.  Hold your breath for 3-5 seconds or for as long as possible. Allow the piston or ball to fall to the bottom of the column.  Remove the mouthpiece from your mouth and breathe out normally.  Rest for a few seconds and repeat Steps 1 through 7 at least 10 times every 1-2 hours when you are awake. Take your time and take a few normal breaths between deep breaths.  The spirometer may include an indicator to show your best effort. Use the indicator as a goal to work toward during each repetition.  After each set of 10 deep breaths,  practice coughing to be sure your lungs are clear. If you have an incision (the cut made at the time of surgery), support your incision when coughing by placing a pillow or rolled up towels firmly against it. Once you are able to get out of bed, walk around indoors and cough well. You may stop using the incentive spirometer when instructed by your caregiver.  RISKS AND COMPLICATIONS  Take your time so you do not get dizzy or light-headed.  If you are in pain, you may need to take or ask for pain medication before doing incentive spirometry. It is harder to take a deep breath if you are having pain. AFTER USE  Rest and breathe slowly and easily.  It can be helpful to keep track of a log of your progress. Your caregiver can provide you with a simple table to help with this. If you are using the spirometer at home, follow these instructions: [place] IF:   You are having difficultly using the spirometer.  You have trouble using the spirometer as often as instructed.  Your pain medication is not giving enough relief while using the spirometer.  You develop fever of 100.5 F (38.1 C) or higher. SEEK IMMEDIATE MEDICAL CARE IF:   You cough up bloody sputum that had  not been present before.  You develop fever of 102 F (38.9 C) or greater.  You develop worsening pain at or near the incision site. MAKE SURE YOU:   Understand these instructions.  Will watch your condition.  Will get help right away if you are not doing well or get worse. Document Released: 04/09/2007 Document Revised: 02/19/2012 Document Reviewed: 06/10/2007 ExitCare Patient Information 2014 Exit[employer], [place].   ________________________________________________________________________  WHAT IS A BLOOD TRANSFUSION? Blood Transfusion Information  A transfusion is the replacement of blood or some of its parts. Blood is made up of multiple cells which provide different functions.  Red blood cells carry oxygen and are used for blood loss replacement.  White blood cells fight against infection.  Platelets control bleeding.  Plasma helps clot blood.  Other blood products are available for specialized needs, such as hemophilia or other clotting disorders. BEFORE THE TRANSFUSION  Who gives blood for transfusions?   Healthy volunteers who are fully evaluated to make sure their blood is safe. This is blood bank blood. Transfusion therapy is the safest it has ever been in the practice of medicine. Before blood is taken from a donor, a complete history is taken to make sure that person has no history of diseases nor engages in risky social behavior (examples are intravenous drug use or sexual activity with multiple partners). The donor's travel history is screened to minimize risk of transmitting infections, such as malaria. The donated blood is tested for signs of infectious diseases, such as HIV and hepatitis. The blood is then tested to be sure it is compatible with you in order to minimize the chance of a transfusion reaction. If you or a relative donates blood, this is often done in anticipation of surgery and is not appropriate for emergency situations. It takes many days to process the  donated blood. RISKS AND COMPLICATIONS Although transfusion therapy is very safe and saves many lives, the main dangers of transfusion include:   Getting an infectious disease.  Developing a transfusion reaction. This is an allergic reaction to something in the blood you were given. Every precaution is taken to prevent this. The decision to have a blood transfusion has been  considered carefully by your caregiver before blood is given. Blood is not given unless the benefits outweigh the risks. AFTER THE TRANSFUSION  Right after receiving a blood transfusion, you will usually feel much better and more energetic. This is especially true if your red blood cells have gotten low (anemic). The transfusion raises the level of the red blood cells which carry oxygen, and this usually causes an energy increase.  The nurse administering the transfusion will monitor you carefully for complications. HOME CARE INSTRUCTIONS  No special instructions are needed after a transfusion. You may find your energy is better. Speak with your caregiver about any limitations on activity for underlying diseases you may have. SEEK MEDICAL CARE IF:   Your condition is not improving after your transfusion.  You develop redness or irritation at the intravenous (IV) site. SEEK IMMEDIATE MEDICAL CARE IF:  Any of the following symptoms occur over the next 12 hours:  Shaking chills.  You have a temperature by mouth above 102 F (38.9 C), not controlled by medicine.  Chest, back, or muscle pain.  People around you feel you are not acting correctly or are confused.  Shortness of breath or difficulty breathing.  Dizziness and fainting.  You get a rash or develop hives.  You have a decrease in urine output.  Your urine turns a dark color or changes to pink, red, or brown. Any of the following symptoms occur over the next 10 days:  You have a temperature by mouth above 102 F (38.9 C), not controlled by  medicine.  Shortness of breath.  Weakness after normal activity.  The white part of the eye turns yellow (jaundice).  You have a decrease in the amount of urine or are urinating less often.  Your urine turns a dark color or changes to pink, red, or brown. Document Released: 11/24/2000 Document Revised: 02/19/2012 Document Reviewed: 07/13/2008 [facility] Patient Information 2014 [place], [place].  _______________________________________________________________________

## 2016-01-20 NOTE — Progress Notes (Signed)
Pt aware to bring CPAP mask and tubing day of surgery.  

## 2016-01-20 NOTE — Progress Notes (Signed)
EKG per chart 11/29/2015  OV note per [facility] 01/14/2016 noting clearance

## 2016-01-23 NOTE — H&P (Signed)
TOTAL HIP ADMISSION H&P  Patient is admitted for right total hip arthroplasty, anterior approach.  Subjective:  Chief Complaint:    Right hip primary OA / pain  HPI: Samuel Goodwin, 55 y.o. male, has a history of pain and functional disability in the right hip(s) due to arthritis and patient has failed non-surgical conservative treatments for greater than 12 weeks to include NSAID's and/or analgesics, corticosteriod injections and activity modification.  Onset of symptoms was gradual starting 2+ years ago with gradually worsening course since that time.The patient noted no past surgery on the right hip(s).  Patient currently rates pain in the right hip at 9 out of 10 with activity. Patient has night pain, worsening of pain with activity and weight bearing, trendelenberg gait, pain that interfers with activities of daily living and pain with passive range of motion. Patient has evidence of periarticular osteophytes and joint space narrowing by imaging studies. This condition presents safety issues increasing the risk of falls.  There is no current active infection.   Risks, benefits and expectations were discussed with the patient.  Risks including but not limited to the risk of anesthesia, blood clots, nerve damage, blood vessel damage, failure of the prosthesis, infection and up to and including death.  Patient understand the risks, benefits and expectations and wishes to proceed with surgery.    PCP: [name], MD  D/C Plans:      Home  Post-op Meds:       No Rx given  Tranexamic Acid:      To be given - IV   Decadron:      Is to be given  FYI:     ASA   Norco    Patient Active Problem List   Diagnosis Date Noted  . S/P knee replacement 11/27/2014  . Unspecified viral hepatitis C without hepatic coma 08/28/2014  . DYSPHONIA, CHRONIC 01/06/2010  . NONSPECIFIC ABNORMAL ELECTROCARDIOGRAM 01/06/2010  . HOARSENESS, CHRONIC 05/31/2009  . Hypothyroidism 04/09/2009  . HYPERLIPIDEMIA  04/09/2009  . Essential hypertension 04/09/2009  . GERD 04/09/2009  . Sleep apnea 04/09/2009   Past Medical History  Diagnosis Date  . Hyperlipidemia   . Hypertension   . Hypothyroidism   . GERD (gastroesophageal reflux disease)   . Alcohol abuse     Sobriety since 1990  . Benign positional vertigo   . OSA (obstructive sleep apnea)     on CPAP  . Anxiety   . Depression   . PTSD (post-traumatic stress disorder)   . Arthritis   . Hepatitis C 1994    hep c - ? etiology; in [place] 1989-91; S/P interferon , Dr [name]  . PONV (postoperative nausea and vomiting)     following knee surgery last year   . History of bronchitis   . Eardrum trauma     30 years ago/right ear  . Hoarseness   . Headache   . Vocal cord polyps     Past Surgical History  Procedure Laterality Date  . Throat surgery      vocal cord  laser sugery X 3, Dr [name] , [facility]  . Upper gastrointestinal endoscopy  2010    Negative, Dr.[name]  . Laryngoscopy  08/2009    Dr.[name]  . Cardiovascular stress test  02/09/2010    No scintigraphic evidence of inducible ischemia.  . Transthoracic echocardiogram  11/08/2005    EF 68%, normal LV systolic function  . Knee arthroscopic Bilateral   . Total knee arthroplasty Left 11/27/2014  dr [name]  . Total knee arthroplasty Left 11/27/2014    Procedure: LEFT TOTAL KNEE ARTHROPLASTY;  Surgeon: Augustin Schooling, MD;  Location: [facility];  Service: Orthopedics;  Laterality: Left;    No prescriptions prior to admission   Allergies  Allergen Reactions  . Benazepril Hcl Cough    Social History  Substance Use Topics  . Smoking status: Never Smoker   . Smokeless tobacco: Never Used     Comment: Quit at age 4  . Alcohol Use: No     Comment: alcoholism quit 30 years ago     Family History  Problem Relation Age of Onset  . Cirrhosis Father   . Diabetes Father   . Stroke Father   . Heart failure Mother   . Hypertension Mother   . Heart attack Brother 3  . Aneurysm  Maternal Aunt      AAA  . Coronary artery disease Maternal Uncle     MI late 41s  . Thyroid disease Mother      Review of Systems  Constitutional: Negative.   HENT: Negative.   Eyes: Negative.   Respiratory: Negative.   Cardiovascular: Negative.   Gastrointestinal: Positive for heartburn.  Genitourinary: Negative.   Musculoskeletal: Positive for joint pain.  Skin: Negative.   Neurological: Negative.   Endo/Heme/Allergies: Negative.   Psychiatric/Behavioral: Positive for depression. The patient is nervous/anxious.     Objective:  Physical Exam  Constitutional: He is oriented to person, place, and time. He appears well-developed.  HENT:  Head: Normocephalic.  Eyes: Pupils are equal, round, and reactive to light.  Neck: Neck supple. No JVD present. No tracheal deviation present. No thyromegaly present.  Cardiovascular: Normal rate, regular rhythm, normal heart sounds and intact distal pulses.   Respiratory: Effort normal and breath sounds normal. No stridor. No respiratory distress. He has no wheezes.  GI: Soft. There is no tenderness. There is no guarding.  Musculoskeletal:       Right hip: He exhibits decreased range of motion, decreased strength, tenderness and bony tenderness. He exhibits no swelling, no deformity and no laceration.  Lymphadenopathy:    He has no cervical adenopathy.  Neurological: He is alert and oriented to person, place, and time.  Skin: Skin is warm and dry.  Psychiatric: He has a normal mood and affect.      Labs:  Estimated body mass index is 40.66 kg/(m^2) as calculated from the following:   Height as of 12/14/14: 5\' 10"  (1.778 m).   Weight as of 12/14/14: 128.549 kg (283 lb 6.4 oz).   Imaging Review Plain radiographs demonstrate severe degenerative joint disease of the right hip(s). The bone quality appears to be good for age and reported activity level.  Assessment/Plan:  End stage arthritis, right hip(s)  The patient history, physical  examination, clinical judgement of the provider and imaging studies are consistent with end stage degenerative joint disease of the right hip(s) and total hip arthroplasty is deemed medically necessary. The treatment options including medical management, injection therapy, arthroscopy and arthroplasty were discussed at length. The risks and benefits of total hip arthroplasty were presented and reviewed. The risks due to aseptic loosening, infection, stiffness, dislocation/subluxation,  thromboembolic complications and other imponderables were discussed.  The patient acknowledged the explanation, agreed to proceed with the plan and consent was signed. Patient is being admitted for inpatient treatment for surgery, pain control, PT, OT, prophylactic antibiotics, VTE prophylaxis, progressive ambulation and ADL's and discharge planning.The patient is planning to be discharged  home.      [place] Pugh. Aunna Snooks   PA-C  01/23/2016, 1:36 PM

## 2016-01-31 MED ORDER — CEFAZOLIN SODIUM 10 G IJ SOLR
3.0000 g | INTRAMUSCULAR | Status: AC
  Administered 2016-02-01: 3 g via INTRAVENOUS
  Filled 2016-01-31: qty 3000

## 2016-02-01 ENCOUNTER — Inpatient Hospital Stay (HOSPITAL_COMMUNITY): Payer: Managed Care, Other (non HMO) | Admitting: Anesthesiology

## 2016-02-01 ENCOUNTER — Inpatient Hospital Stay (HOSPITAL_COMMUNITY): Payer: Managed Care, Other (non HMO)

## 2016-02-01 ENCOUNTER — Encounter (HOSPITAL_COMMUNITY)
Admission: RE | Disposition: A | Discharge status: Home Health | Payer: Self-pay | Source: Ambulatory Visit | Attending: Orthopedic Surgery

## 2016-02-01 ENCOUNTER — Inpatient Hospital Stay (HOSPITAL_COMMUNITY)
Admission: RE | Admit: 2016-02-01 | Discharge: 2016-02-02 | DRG: 470 | Disposition: A | Discharge status: Home Health | Payer: Managed Care, Other (non HMO) | Source: Ambulatory Visit | Attending: Orthopedic Surgery | Admitting: Orthopedic Surgery

## 2016-02-01 ENCOUNTER — Encounter (HOSPITAL_COMMUNITY): Payer: Self-pay

## 2016-02-01 DIAGNOSIS — E785 Hyperlipidemia, unspecified: Secondary | ICD-10-CM | POA: Diagnosis present

## 2016-02-01 DIAGNOSIS — G4733 Obstructive sleep apnea (adult) (pediatric): Secondary | ICD-10-CM | POA: Diagnosis present

## 2016-02-01 DIAGNOSIS — Z01812 Encounter for preprocedural laboratory examination: Secondary | ICD-10-CM | POA: Diagnosis not present

## 2016-02-01 DIAGNOSIS — Z96649 Presence of unspecified artificial hip joint: Secondary | ICD-10-CM

## 2016-02-01 DIAGNOSIS — K219 Gastro-esophageal reflux disease without esophagitis: Secondary | ICD-10-CM | POA: Diagnosis present

## 2016-02-01 DIAGNOSIS — I10 Essential (primary) hypertension: Secondary | ICD-10-CM | POA: Diagnosis present

## 2016-02-01 DIAGNOSIS — Z6839 Body mass index (BMI) 39.0-39.9, adult: Secondary | ICD-10-CM

## 2016-02-01 DIAGNOSIS — Z96652 Presence of left artificial knee joint: Secondary | ICD-10-CM | POA: Diagnosis present

## 2016-02-01 DIAGNOSIS — M25551 Pain in right hip: Secondary | ICD-10-CM | POA: Diagnosis present

## 2016-02-01 DIAGNOSIS — E039 Hypothyroidism, unspecified: Secondary | ICD-10-CM | POA: Diagnosis present

## 2016-02-01 DIAGNOSIS — M1611 Unilateral primary osteoarthritis, right hip: Secondary | ICD-10-CM | POA: Diagnosis present

## 2016-02-01 DIAGNOSIS — E669 Obesity, unspecified: Secondary | ICD-10-CM | POA: Diagnosis present

## 2016-02-01 HISTORY — PX: TOTAL HIP ARTHROPLASTY: SHX124

## 2016-02-01 HISTORY — DX: Presence of unspecified artificial hip joint: Z96.649

## 2016-02-01 SURGERY — ARTHROPLASTY, HIP, TOTAL, ANTERIOR APPROACH
Anesthesia: Spinal | Site: Hip | Laterality: Right

## 2016-02-01 MED ORDER — FENTANYL CITRATE (PF) 100 MCG/2ML IJ SOLN
INTRAMUSCULAR | Status: AC
  Filled 2016-02-01: qty 2

## 2016-02-01 MED ORDER — ONDANSETRON HCL 4 MG/2ML IJ SOLN
INTRAMUSCULAR | Status: AC
  Filled 2016-02-01: qty 2

## 2016-02-01 MED ORDER — LIDOCAINE HCL (CARDIAC) 20 MG/ML IV SOLN
INTRAVENOUS | Status: AC
  Filled 2016-02-01: qty 5

## 2016-02-01 MED ORDER — FENTANYL CITRATE (PF) 100 MCG/2ML IJ SOLN
25.0000 ug | INTRAMUSCULAR | Status: DC | PRN
  Administered 2016-02-01 (×3): 50 ug via INTRAVENOUS

## 2016-02-01 MED ORDER — HYDROCHLOROTHIAZIDE 25 MG PO TABS
25.0000 mg | ORAL_TABLET | Freq: Every morning | ORAL | Status: DC
  Administered 2016-02-02: 25 mg via ORAL
  Filled 2016-02-01: qty 1

## 2016-02-01 MED ORDER — FENTANYL CITRATE (PF) 100 MCG/2ML IJ SOLN
INTRAMUSCULAR | Status: AC
Start: 2016-02-01 — End: 2016-02-01
  Filled 2016-02-01: qty 2

## 2016-02-01 MED ORDER — PROPOFOL 10 MG/ML IV BOLUS
INTRAVENOUS | Status: AC
  Filled 2016-02-01: qty 40

## 2016-02-01 MED ORDER — METOCLOPRAMIDE HCL 10 MG PO TABS: 5.0000 mg | ORAL_TABLET | Freq: Three times a day (TID) | ORAL | Status: DC | PRN

## 2016-02-01 MED ORDER — METHOCARBAMOL 500 MG PO TABS
500.0000 mg | ORAL_TABLET | Freq: Four times a day (QID) | ORAL | Status: DC | PRN
  Administered 2016-02-02 (×2): 500 mg via ORAL
  Filled 2016-02-01 (×3): qty 1

## 2016-02-01 MED ORDER — PHENOL 1.4 % MT LIQD
1.0000 | OROMUCOSAL | Status: DC | PRN
  Filled 2016-02-01: qty 177

## 2016-02-01 MED ORDER — LEVOTHYROXINE SODIUM 25 MCG PO TABS
25.0000 ug | ORAL_TABLET | Freq: Every day | ORAL | Status: DC
  Filled 2016-02-01: qty 1

## 2016-02-01 MED ORDER — SODIUM CHLORIDE 0.9 % IR SOLN
Status: DC | PRN
  Administered 2016-02-01: 1000 mL

## 2016-02-01 MED ORDER — ALUM & MAG HYDROXIDE-SIMETH 200-200-20 MG/5ML PO SUSP: 30.0000 mL | ORAL | Status: DC | PRN

## 2016-02-01 MED ORDER — STERILE WATER FOR IRRIGATION IR SOLN
Status: DC | PRN
  Administered 2016-02-01: 1000 mL

## 2016-02-01 MED ORDER — DIPHENHYDRAMINE HCL 25 MG PO CAPS: 25.0000 mg | ORAL_CAPSULE | Freq: Four times a day (QID) | ORAL | Status: DC | PRN

## 2016-02-01 MED ORDER — PROPOFOL 10 MG/ML IV BOLUS
INTRAVENOUS | Status: AC
  Filled 2016-02-01: qty 20

## 2016-02-01 MED ORDER — FERROUS SULFATE 325 (65 FE) MG PO TABS
325.0000 mg | ORAL_TABLET | Freq: Three times a day (TID) | ORAL | Status: DC
  Administered 2016-02-01: 325 mg via ORAL
  Filled 2016-02-01 (×5): qty 1

## 2016-02-01 MED ORDER — ASPIRIN EC 325 MG PO TBEC
325.0000 mg | DELAYED_RELEASE_TABLET | Freq: Two times a day (BID) | ORAL | Status: DC
  Administered 2016-02-02: 325 mg via ORAL
  Filled 2016-02-01 (×3): qty 1

## 2016-02-01 MED ORDER — LEVOTHYROXINE SODIUM 200 MCG PO TABS
200.0000 ug | ORAL_TABLET | Freq: Every day | ORAL | Status: DC
  Filled 2016-02-01: qty 1

## 2016-02-01 MED ORDER — MIDAZOLAM HCL 5 MG/5ML IJ SOLN
INTRAMUSCULAR | Status: DC | PRN
  Administered 2016-02-01: 2 mg via INTRAVENOUS

## 2016-02-01 MED ORDER — ONDANSETRON HCL 4 MG PO TABS: 4.0000 mg | ORAL_TABLET | Freq: Four times a day (QID) | ORAL | Status: DC | PRN

## 2016-02-01 MED ORDER — BUPIVACAINE HCL (PF) 0.5 % IJ SOLN
INTRAMUSCULAR | Status: AC
  Filled 2016-02-01: qty 30

## 2016-02-01 MED ORDER — OMEPRAZOLE 20 MG PO CPDR
20.0000 mg | DELAYED_RELEASE_CAPSULE | Freq: Two times a day (BID) | ORAL | Status: DC
  Administered 2016-02-02: 20 mg via ORAL
  Filled 2016-02-01 (×3): qty 1

## 2016-02-01 MED ORDER — DEXAMETHASONE SODIUM PHOSPHATE 10 MG/ML IJ SOLN
10.0000 mg | Freq: Once | INTRAMUSCULAR | Status: AC
  Administered 2016-02-01: 10 mg via INTRAVENOUS

## 2016-02-01 MED ORDER — PROMETHAZINE HCL 25 MG/ML IJ SOLN
6.2500 mg | INTRAMUSCULAR | Status: DC | PRN
Start: 2016-02-01 — End: 2016-02-01

## 2016-02-01 MED ORDER — ONDANSETRON HCL 4 MG/2ML IJ SOLN
4.0000 mg | Freq: Four times a day (QID) | INTRAMUSCULAR | Status: DC | PRN
  Administered 2016-02-01: 4 mg via INTRAVENOUS
  Filled 2016-02-01: qty 2

## 2016-02-01 MED ORDER — FENTANYL CITRATE (PF) 100 MCG/2ML IJ SOLN
INTRAMUSCULAR | Status: DC | PRN
  Administered 2016-02-01: 50 ug via INTRAVENOUS

## 2016-02-01 MED ORDER — PROPOFOL 10 MG/ML IV BOLUS
INTRAVENOUS | Status: DC | PRN
  Administered 2016-02-01: 40 mg via INTRAVENOUS

## 2016-02-01 MED ORDER — LEVOTHYROXINE SODIUM 25 MCG PO TABS
225.0000 ug | ORAL_TABLET | Freq: Every day | ORAL | Status: DC
  Administered 2016-02-02: 225 ug via ORAL
  Filled 2016-02-01 (×2): qty 1

## 2016-02-01 MED ORDER — TRANEXAMIC ACID 1000 MG/10ML IV SOLN
1000.0000 mg | Freq: Once | INTRAVENOUS | Status: AC
  Administered 2016-02-01: 1000 mg via INTRAVENOUS
  Filled 2016-02-01: qty 10

## 2016-02-01 MED ORDER — HYDROCODONE-ACETAMINOPHEN 7.5-325 MG PO TABS
1.0000 | ORAL_TABLET | ORAL | Status: DC
  Administered 2016-02-01: 1 via ORAL
  Administered 2016-02-02 (×4): 2 via ORAL
  Filled 2016-02-01 (×4): qty 2
  Filled 2016-02-01: qty 1

## 2016-02-01 MED ORDER — BUPIVACAINE HCL (PF) 0.5 % IJ SOLN
INTRAMUSCULAR | Status: DC | PRN
  Administered 2016-02-01: 3 mL via INTRATHECAL

## 2016-02-01 MED ORDER — LACTATED RINGERS IV SOLN
INTRAVENOUS | Status: DC
  Administered 2016-02-01: 16:00:00 via INTRAVENOUS
  Administered 2016-02-01: 1000 mL via INTRAVENOUS
  Administered 2016-02-01 (×2): via INTRAVENOUS

## 2016-02-01 MED ORDER — NON FORMULARY: 20.0000 mg | Freq: Two times a day (BID) | Status: DC

## 2016-02-01 MED ORDER — PROPOFOL 500 MG/50ML IV EMUL
INTRAVENOUS | Status: DC | PRN
  Administered 2016-02-01: 75 ug/kg/min via INTRAVENOUS

## 2016-02-01 MED ORDER — DOCUSATE SODIUM 100 MG PO CAPS
100.0000 mg | ORAL_CAPSULE | Freq: Two times a day (BID) | ORAL | Status: DC
  Administered 2016-02-01 – 2016-02-02 (×2): 100 mg via ORAL

## 2016-02-01 MED ORDER — DEXAMETHASONE SODIUM PHOSPHATE 10 MG/ML IJ SOLN
INTRAMUSCULAR | Status: AC
  Filled 2016-02-01: qty 1

## 2016-02-01 MED ORDER — DEXTROSE 5 % IV SOLN
500.0000 mg | Freq: Four times a day (QID) | INTRAVENOUS | Status: DC | PRN
  Administered 2016-02-01 – 2016-02-02 (×2): 500 mg via INTRAVENOUS
  Filled 2016-02-01 (×5): qty 5

## 2016-02-01 MED ORDER — CELECOXIB 200 MG PO CAPS
200.0000 mg | ORAL_CAPSULE | Freq: Two times a day (BID) | ORAL | Status: DC
Start: 2016-02-01 — End: 2016-02-02
  Administered 2016-02-01 – 2016-02-02 (×2): 200 mg via ORAL
  Filled 2016-02-01 (×4): qty 1

## 2016-02-01 MED ORDER — POTASSIUM CHLORIDE 2 MEQ/ML IV SOLN
100.0000 mL/h | INTRAVENOUS | Status: DC
  Administered 2016-02-01: 100 mL/h via INTRAVENOUS
  Filled 2016-02-01 (×4): qty 1000

## 2016-02-01 MED ORDER — DEXAMETHASONE SODIUM PHOSPHATE 10 MG/ML IJ SOLN
10.0000 mg | Freq: Once | INTRAMUSCULAR | Status: DC
  Filled 2016-02-01: qty 1

## 2016-02-01 MED ORDER — LOSARTAN POTASSIUM 50 MG PO TABS
50.0000 mg | ORAL_TABLET | Freq: Every day | ORAL | Status: DC
  Administered 2016-02-01 – 2016-02-02 (×2): 50 mg via ORAL
  Filled 2016-02-01 (×2): qty 1

## 2016-02-01 MED ORDER — MIDAZOLAM HCL 2 MG/2ML IJ SOLN
INTRAMUSCULAR | Status: AC
  Filled 2016-02-01: qty 2

## 2016-02-01 MED ORDER — PRAVASTATIN SODIUM 40 MG PO TABS
40.0000 mg | ORAL_TABLET | Freq: Every day | ORAL | Status: DC
  Administered 2016-02-01: 40 mg via ORAL
  Filled 2016-02-01 (×2): qty 1

## 2016-02-01 MED ORDER — CHLORHEXIDINE GLUCONATE 4 % EX LIQD: 60.0000 mL | Freq: Once | CUTANEOUS | Status: DC

## 2016-02-01 MED ORDER — MENTHOL 3 MG MT LOZG
1.0000 | LOZENGE | OROMUCOSAL | Status: DC | PRN
  Administered 2016-02-02: 3 mg via ORAL
  Filled 2016-02-01: qty 9

## 2016-02-01 MED ORDER — CEFAZOLIN SODIUM-DEXTROSE 2-3 GM-% IV SOLR
2.0000 g | Freq: Four times a day (QID) | INTRAVENOUS | Status: AC
  Administered 2016-02-01 – 2016-02-02 (×2): 2 g via INTRAVENOUS
  Filled 2016-02-01 (×4): qty 50

## 2016-02-01 MED ORDER — MAGNESIUM CITRATE PO SOLN: 1.0000 | Freq: Once | ORAL | Status: DC | PRN

## 2016-02-01 MED ORDER — POLYETHYLENE GLYCOL 3350 17 G PO PACK
17.0000 g | PACK | Freq: Two times a day (BID) | ORAL | Status: DC
  Administered 2016-02-01 – 2016-02-02 (×2): 17 g via ORAL

## 2016-02-01 MED ORDER — METOCLOPRAMIDE HCL 5 MG/ML IJ SOLN: 5.0000 mg | Freq: Three times a day (TID) | INTRAMUSCULAR | Status: DC | PRN

## 2016-02-01 MED ORDER — BISACODYL 10 MG RE SUPP: 10.0000 mg | Freq: Every day | RECTAL | Status: DC | PRN

## 2016-02-01 MED ORDER — HYDROMORPHONE HCL 1 MG/ML IJ SOLN
0.5000 mg | INTRAMUSCULAR | Status: DC | PRN
  Administered 2016-02-01 (×2): 0.5 mg via INTRAVENOUS
  Filled 2016-02-01: qty 1

## 2016-02-01 SURGICAL SUPPLY — 33 items
CAPT HIP TOTAL 2 ×1 IMPLANT
CLOTH BEACON ORANGE TIMEOUT ST (SAFETY) ×2 IMPLANT
COVER PERINEAL POST (MISCELLANEOUS) ×2 IMPLANT
DRAPE STERI IOBAN 125X83 (DRAPES) ×2 IMPLANT
DRAPE U-SHAPE 47X51 STRL (DRAPES) ×4 IMPLANT
DRSG AQUACEL AG ADV 3.5X10 (GAUZE/BANDAGES/DRESSINGS) ×2 IMPLANT
DURAPREP 26ML APPLICATOR (WOUND CARE) ×2 IMPLANT
ELECT REM PT RETURN 15FT ADLT (MISCELLANEOUS) IMPLANT
ELECT REM PT RETURN 9FT ADLT (ELECTROSURGICAL) ×2
ELECTRODE REM PT RTRN 9FT ADLT (ELECTROSURGICAL) ×1 IMPLANT
GLOVE BIOGEL PI IND STRL 6.5 (GLOVE) IMPLANT
GLOVE BIOGEL PI IND STRL 7.5 (GLOVE) ×1 IMPLANT
GLOVE BIOGEL PI IND STRL 8.5 (GLOVE) ×1 IMPLANT
GLOVE BIOGEL PI INDICATOR 6.5 (GLOVE) ×1
GLOVE BIOGEL PI INDICATOR 7.5 (GLOVE) ×4
GLOVE BIOGEL PI INDICATOR 8.5 (GLOVE) ×1
GLOVE ECLIPSE 8.0 STRL XLNG CF (GLOVE) ×4 IMPLANT
GLOVE ORTHO TXT STRL SZ7.5 (GLOVE) ×2 IMPLANT
GLOVE SURG SS PI 6.5 STRL IVOR (GLOVE) ×1 IMPLANT
GLOVE SURG SS PI 7.5 STRL IVOR (GLOVE) ×1 IMPLANT
GOWN STRL REUS W/TWL LRG LVL3 (GOWN DISPOSABLE) ×2 IMPLANT
GOWN STRL REUS W/TWL XL LVL3 (GOWN DISPOSABLE) ×4 IMPLANT
HOLDER FOLEY CATH W/STRAP (MISCELLANEOUS) ×2 IMPLANT
LIQUID BAND (GAUZE/BANDAGES/DRESSINGS) ×2 IMPLANT
PACK ANTERIOR HIP CUSTOM (KITS) ×2 IMPLANT
SAW OSC TIP CART 19.5X105X1.3 (SAW) ×2 IMPLANT
SUT MNCRL AB 4-0 PS2 18 (SUTURE) ×2 IMPLANT
SUT VIC AB 1 CT1 36 (SUTURE) ×6 IMPLANT
SUT VIC AB 2-0 CT1 27 (SUTURE) ×4
SUT VIC AB 2-0 CT1 TAPERPNT 27 (SUTURE) ×2 IMPLANT
SUT VLOC 180 0 24IN GS25 (SUTURE) ×2 IMPLANT
TRAY FOLEY W/METER SILVER 16FR (SET/KITS/TRAYS/PACK) ×1 IMPLANT
YANKAUER SUCT BULB TIP 10FT TU (MISCELLANEOUS) ×1 IMPLANT

## 2016-02-01 NOTE — Op Note (Signed)
NAME:  Samuel Goodwin                ACCOUNT NO.: [id number]      MEDICAL RECORD NO.: [id number]      FACILITY:  [facility]      PHYSICIAN:  Paralee Cancel D  DATE OF BIRTH:  1961/07/17     DATE OF PROCEDURE:  02/01/2016                                 OPERATIVE REPORT         PREOPERATIVE DIAGNOSIS: Right  hip osteoarthritis.      POSTOPERATIVE DIAGNOSIS:  Right hip osteoarthritis.      PROCEDURE:  Right total hip replacement through an anterior approach   utilizing DePuy THR system, component size 47mm pinnacle cup, a size 36+4 neutral   Altrex liner, a size 8 Hi Tri Lock stem with a 36+1.5 delta ceramic   ball.      SURGEON:  Pietro Cassis. Alvan Dame, M.D.      ASSISTANT:  Danae Orleans, PA-C      ANESTHESIA:  Spinal.      SPECIMENS:  None.      COMPLICATIONS:  None.      BLOOD LOSS:  500 cc     DRAINS:  None     INDICATION OF THE PROCEDURE:  Samuel Goodwin is a 55 y.o. male who had   presented to office for evaluation of right hip pain.  Radiographs revealed   progressive degenerative changes with bone-on-bone   articulation to the  hip joint.  The patient had painful limited range of   motion significantly affecting their overall quality of life.  The patient was failing to    respond to conservative measures, and at this point was ready   to proceed with more definitive measures.  The patient has noted progressive   degenerative changes in his hip, progressive problems and dysfunction   with regarding the hip prior to surgery.  Consent was obtained for   benefit of pain relief.  Specific risk of infection, DVT, component   failure, dislocation, need for revision surgery, as well discussion of   the anterior versus posterior approach were reviewed.  Consent was   obtained for benefit of anterior pain relief through an anterior   approach.      PROCEDURE IN DETAIL:  The patient was brought to operative theater.   Once adequate anesthesia, preoperative  antibiotics, 2gm of Ancef, 1 gm of Tranexamic Acid, and 10 mg of Decadron administered.   The patient was positioned supine on the OSI Hanna table.  Once adequate   padding of boney process was carried out, we had predraped out the hip, and  used fluoroscopy to confirm orientation of the pelvis and position.      The right hip was then prepped and draped from proximal iliac crest to   mid thigh with shower curtain technique.      Time-out was performed identifying the patient, planned procedure, and   extremity.     An incision was then made 2 cm distal and lateral to the   anterior superior iliac spine extending over the orientation of the   tensor fascia lata muscle and sharp dissection was carried down to the   fascia of the muscle and protractor placed in the soft tissues.      The fascia was  then incised.  The muscle belly was identified and swept   laterally and retractor placed along the superior neck.  Following   cauterization of the circumflex vessels and removing some pericapsular   fat, a second cobra retractor was placed on the inferior neck.  A third   retractor was placed on the anterior acetabulum after elevating the   anterior rectus.  A L-capsulotomy was along the line of the   superior neck to the trochanteric fossa, then extended proximally and   distally.  Tag sutures were placed and the retractors were then placed   intracapsular.  We then identified the trochanteric fossa and   orientation of my neck cut, confirmed this radiographically   and then made a neck osteotomy with the femur on traction.  The femoral   head was removed without difficulty or complication.  Traction was let   off and retractors were placed posterior and anterior around the   acetabulum.      The labrum and foveal tissue were debrided.  I began reaming with a 16mm   reamer and reamed up to 53mm reamer with good bony bed preparation and a 45mm   cup was chosen.  The final 91mm Pinnacle cup  was then impacted under fluoroscopy  to confirm the depth of penetration and orientation with respect to   abduction.  A screw was placed followed by the hole eliminator.  The final   36+4 neutral Altrex liner was impacted with good visualized rim fit.  The cup was positioned anatomically within the acetabular portion of the pelvis.      At this point, the femur was rolled at 80 degrees.  Further capsule was   released off the inferior aspect of the femoral neck.  I then   released the superior capsule proximally.  The hook was placed laterally   along the femur and elevated manually and held in position with the bed   hook.  The leg was then extended and adducted with the leg rolled to 100   degrees of external rotation.  Once the proximal femur was fully   exposed, I used a box osteotome to set orientation.  I then began   broaching with the starting chili pepper broach and passed this by hand and then broached up to 8.  With the 8 broach in place I chose a high offset neck and did trial several reductions.  The offset was appropriate, leg lengths   appeared to be equal best matched with the +1.5, confirmed radiographically.   Given these findings, I went ahead and dislocated the hip, repositioned all   retractors and positioned the right hip in the extended and abducted position.  The final 8 Hi Tri Lock stem was   chosen and it was impacted down to the level of neck cut.  Based on this   and the trial reduction, a 36+1.5 delta ceramic ball was chosen and   impacted onto a clean and dry trunnion, and the hip was reduced.  The   hip had been irrigated throughout the case again at this point.  I did   reapproximate the superior capsular leaflet to the anterior leaflet   using #1 Vicryl.  The fascia of the   tensor fascia lata muscle was then reapproximated using #1 Vicryl and #0 V-lock sutures.  The   remaining wound was closed with 2-0 Vicryl and running 4-0 Monocryl.   The hip was cleaned,  dried, and dressed sterilely  using Dermabond and   Aquacel dressing.  He was then brought   to recovery room in stable condition tolerating the procedure well.    Danae Orleans, PA-C was present for the entirety of the case involved from   preoperative positioning, perioperative retractor management, general   facilitation of the case, as well as primary wound closure as assistant.            Pietro Cassis Alvan Dame, M.D.        02/01/2016 3:23 PM

## 2016-02-01 NOTE — Anesthesia Preprocedure Evaluation (Signed)
Anesthesia Evaluation  Patient identified by MRN, date of birth, ID band Patient awake    Reviewed: Allergy & Precautions, NPO status , Patient's Chart, lab work & pertinent test results  History of Anesthesia Complications (+) PONV  Airway Mallampati: II  TM Distance: >3 FB Neck ROM: Full    Dental   Pulmonary sleep apnea ,    breath sounds clear to auscultation       Cardiovascular hypertension,  Rhythm:Regular Rate:Normal     Neuro/Psych    GI/Hepatic GERD  ,(+) Hepatitis -  Endo/Other  Hypothyroidism   Renal/GU      Musculoskeletal   Abdominal   Peds  Hematology   Anesthesia Other Findings   Reproductive/Obstetrics                             Anesthesia Physical Anesthesia Plan  ASA: III  Anesthesia Plan: Spinal   Post-op Pain Management:    Induction: Intravenous  Airway Management Planned: Simple Face Mask  Additional Equipment:   Intra-op Plan:   Post-operative Plan:   Informed Consent: I have reviewed the patients History and Physical, chart, labs and discussed the procedure including the risks, benefits and alternatives for the proposed anesthesia with the patient or authorized representative who has indicated his/her understanding and acceptance.   Dental advisory given  Plan Discussed with: CRNA and Anesthesiologist  Anesthesia Plan Comments:         Anesthesia Quick Evaluation

## 2016-02-01 NOTE — Transfer of Care (Signed)
Immediate Anesthesia Transfer of Care Note  Patient: Samuel Goodwin  Procedure(s) Performed: Procedure(s): RIGHT TOTAL HIP ARTHROPLASTY ANTERIOR APPROACH (Right)  Patient Location: PACU  Anesthesia Type:Spinal  Level of Consciousness: awake, alert  and oriented  Airway & Oxygen Therapy: Patient Spontanous Breathing and Patient connected to face mask oxygen  Post-op Assessment: Report given to RN and Post -op Vital signs reviewed and stable  Post vital signs: Reviewed and stable  Last Vitals:  Filed Vitals:   02/01/16 1100  BP: 149/96  Pulse: 86  Temp: 36.6 C  Resp: 16    Complications: No apparent anesthesia complications

## 2016-02-01 NOTE — Anesthesia Procedure Notes (Signed)
Spinal Patient location during procedure: OR Start time: 02/01/2016 2:54 PM End time: 02/01/2016 3:02 PM Reason for block: at surgeon's request Staffing Resident/CRNA: Anne Fu Performed by: resident/CRNA  Preanesthetic Checklist Completed: patient identified, site marked, surgical consent, pre-op evaluation, timeout performed, IV checked, risks and benefits discussed, monitors and equipment checked and at surgeon's request Spinal Block Patient position: sitting Prep: Betadine Approach: right paramedian Location: L2-3 Injection technique: single-shot Needle Needle type: Sprotte  Needle gauge: 24 G Assessment Sensory level: T6 Additional Notes Expiration date of kit checked and confirmed. Patient tolerated procedure well, without complications. X 1 attempt with noted clear CSF return. Loss of motor and sensory on exam post injection. Use 24G X 140m Rt paramedian with included introducer.

## 2016-02-01 NOTE — Interval H&P Note (Signed)
History and Physical Interval Note:  02/01/2016 2:11 PM  Samuel Goodwin  has presented today for surgery, with the diagnosis of RIGHT  HIP OA  The various methods of treatment have been discussed with the patient and family. After consideration of risks, benefits and other options for treatment, the patient has consented to  Procedure(s): RIGHT TOTAL HIP ARTHROPLASTY ANTERIOR APPROACH (Right) as a surgical intervention .  The patient's history has been reviewed, patient examined, no change in status, stable for surgery.  I have reviewed the patient's chart and labs.  Questions were answered to the patient's satisfaction.     [name]

## 2016-02-01 NOTE — Discharge Instructions (Signed)

## 2016-02-01 NOTE — Anesthesia Postprocedure Evaluation (Signed)
Anesthesia Post Note  Patient: Samuel Goodwin  Procedure(s) Performed: Procedure(s) (LRB): RIGHT TOTAL HIP ARTHROPLASTY ANTERIOR APPROACH (Right)  Patient location during evaluation: PACU Anesthesia Type: Spinal Level of consciousness: awake Pain management: pain level controlled Respiratory status: spontaneous breathing Cardiovascular status: stable Postop Assessment: spinal receding Anesthetic complications: no    Last Vitals:  Filed Vitals:   02/01/16 1730 02/01/16 1745  BP: 122/94 131/87  Pulse: 81 73  Temp:    Resp: 18 22    Last Pain:  Filed Vitals:   02/01/16 1753  PainSc: 0-No pain                 EDWARDS,Barney Gertsch

## 2016-02-02 ENCOUNTER — Encounter (HOSPITAL_COMMUNITY): Payer: Self-pay | Admitting: Orthopedic Surgery

## 2016-02-02 DIAGNOSIS — E669 Obesity, unspecified: Secondary | ICD-10-CM | POA: Diagnosis present

## 2016-02-02 LAB — CBC
HCT: 35.8 % — ABNORMAL LOW (ref 39.0–52.0)
HEMOGLOBIN: 11.8 g/dL — AB (ref 13.0–17.0)
MCH: 29.1 pg (ref 26.0–34.0)
MCHC: 33 g/dL (ref 30.0–36.0)
MCV: 88.2 fL (ref 78.0–100.0)
Platelets: 228 10*3/uL (ref 150–400)
RBC: 4.06 MIL/uL — AB (ref 4.22–5.81)
RDW: 13.1 % (ref 11.5–15.5)
WBC: 9.8 10*3/uL (ref 4.0–10.5)

## 2016-02-02 LAB — BASIC METABOLIC PANEL
ANION GAP: 9 (ref 5–15)
BUN: 15 mg/dL (ref 6–20)
CHLORIDE: 103 mmol/L (ref 101–111)
CO2: 25 mmol/L (ref 22–32)
CREATININE: 0.81 mg/dL (ref 0.61–1.24)
Calcium: 8.5 mg/dL — ABNORMAL LOW (ref 8.9–10.3)
GFR calc non Af Amer: >60 mL/min (ref >60)
Glucose, Bld: 175 mg/dL — ABNORMAL HIGH (ref 65–99)
POTASSIUM: 4.1 mmol/L (ref 3.5–5.1)
SODIUM: 137 mmol/L (ref 135–145)

## 2016-02-02 MED ORDER — ASPIRIN 325 MG PO TBEC: 325.0000 mg | DELAYED_RELEASE_TABLET | Freq: Two times a day (BID) | ORAL | Status: DC

## 2016-02-02 MED ORDER — HYDROCODONE-ACETAMINOPHEN 7.5-325 MG PO TABS: 1.0000 | ORAL_TABLET | ORAL | Status: DC | PRN

## 2016-02-02 MED ORDER — FERROUS SULFATE 325 (65 FE) MG PO TABS
325.0000 mg | ORAL_TABLET | Freq: Three times a day (TID) | ORAL | Status: DC
Start: 2016-02-02 — End: 2016-08-15

## 2016-02-02 MED ORDER — POLYETHYLENE GLYCOL 3350 17 G PO PACK: 17.0000 g | PACK | Freq: Two times a day (BID) | ORAL | Status: DC

## 2016-02-02 MED ORDER — METHOCARBAMOL 500 MG PO TABS: 500.0000 mg | ORAL_TABLET | Freq: Four times a day (QID) | ORAL | Status: DC | PRN

## 2016-02-02 MED ORDER — DOCUSATE SODIUM 100 MG PO CAPS: 100.0000 mg | ORAL_CAPSULE | Freq: Two times a day (BID) | ORAL | Status: DC

## 2016-02-02 NOTE — Discharge Summary (Signed)
Physician Discharge Summary  Patient ID: Samuel Goodwin MRN: [id number] DOB/AGE: Jun 08, 1961 55 y.o.  Admit date: 02/01/2016 Discharge date:  02/02/2016  Procedures:  Procedure(s) (LRB): RIGHT TOTAL HIP ARTHROPLASTY ANTERIOR APPROACH (Right)  Attending Physician:  Dr. Paralee Cancel   Admission Diagnoses:   Right hip primary OA / pain  Discharge Diagnoses:  Principal Problem:   S/P right THA, AA  Past Medical History  Diagnosis Date  . Hyperlipidemia   . Hypertension   . Hypothyroidism   . GERD (gastroesophageal reflux disease)   . Alcohol abuse     Sobriety since 1990  . Benign positional vertigo   . OSA (obstructive sleep apnea)     on CPAP  . Anxiety   . Depression   . PTSD (post-traumatic stress disorder)   . Arthritis   . Hepatitis C 1994    hep c - ? etiology; in [place] 1989-91; S/P interferon , Dr [name]  . PONV (postoperative nausea and vomiting)     following knee surgery last year   . History of bronchitis   . Eardrum trauma     30 years ago/right ear  . Hoarseness   . Headache   . Vocal cord polyps     HPI:    Samuel Goodwin, 55 y.o. male, has a history of pain and functional disability in the right hip(s) due to arthritis and patient has failed non-surgical conservative treatments for greater than 12 weeks to include NSAID's and/or analgesics, corticosteriod injections and activity modification. Onset of symptoms was gradual starting 2+ years ago with gradually worsening course since that time.The patient noted no past surgery on the right hip(s). Patient currently rates pain in the right hip at 9 out of 10 with activity. Patient has night pain, worsening of pain with activity and weight bearing, trendelenberg gait, pain that interfers with activities of daily living and pain with passive range of motion. Patient has evidence of periarticular osteophytes and joint space narrowing by imaging studies. This condition presents safety issues increasing the risk of  falls. There is no current active infection. Risks, benefits and expectations were discussed with the patient. Risks including but not limited to the risk of anesthesia, blood clots, nerve damage, blood vessel damage, failure of the prosthesis, infection and up to and including death. Patient understand the risks, benefits and expectations and wishes to proceed with surgery.   PCP: [name], MD   Discharged Condition: good  Hospital Course:  Patient underwent the above stated procedure on 02/01/2016. Patient tolerated the procedure well and brought to the recovery room in good condition and subsequently to the floor.  POD #1 BP: 127/81 ; Pulse: 82 ; Temp: 97.7 F (36.5 C) ; Resp: 18 Patient reports pain as mild, pain controlled. No events throughout the night. Feeling pretty good now, better than prior to surgery. Ready to be discharged home. Dorsiflexion/plantar flexion intact, incision: dressing C/D/I, no cellulitis present and compartment soft.   LABS  Basename    HGB     11.8  HCT     35.8    Discharge Exam: General appearance: alert, cooperative and no distress Extremities: Homans sign is negative, no sign of DVT, no edema, redness or tenderness in the calves or thighs and no ulcers, gangrene or trophic changes  Disposition: Home with follow up in 2 weeks   Follow-up Information    Follow up with [name], MD. Schedule an appointment as soon as possible for a visit in 2  weeks.   Specialty:  Orthopedic Surgery   Contact information:   [address] [place] [address] [id number]       Discharge Instructions    Call MD / Call 911    Complete by:  As directed   If you experience chest pain or shortness of breath, CALL 911 and be transported to the hospital emergency room.  If you develope a fever above 101 F, pus (white drainage) or increased drainage or redness at the wound, or calf pain, call your surgeon's office.     Change  dressing    Complete by:  As directed   Maintain surgical dressing until follow up in the clinic. If the edges start to pull up, may reinforce with tape. If the dressing is no longer working, may remove and cover with gauze and tape, but must keep the area dry and clean.  Call with any questions or concerns.     Constipation Prevention    Complete by:  As directed   Drink plenty of fluids.  Prune juice may be helpful.  You may use a stool softener, such as Colace (over the counter) 100 mg twice a day.  Use MiraLax (over the counter) for constipation as needed.     Diet - low sodium heart healthy    Complete by:  As directed      Discharge instructions    Complete by:  As directed   Maintain surgical dressing until follow up in the clinic. If the edges start to pull up, may reinforce with tape. If the dressing is no longer working, may remove and cover with gauze and tape, but must keep the area dry and clean.  Follow up in 2 weeks at [facility]. Call with any questions or concerns.     Increase activity slowly as tolerated    Complete by:  As directed   Weight bearing as tolerated with assist device (walker, cane, etc) as directed, use it as long as suggested by your surgeon or therapist, typically at least 4-6 weeks.     TED hose    Complete by:  As directed   Use stockings (TED hose) for 2 weeks on both leg(s).  You may remove them at night for sleeping.             Medication List    STOP taking these medications        ibuprofen 200 MG tablet  Commonly known as:  ADVIL,MOTRIN     naproxen 500 MG tablet  Commonly known as:  NAPROSYN     oxyCODONE-acetaminophen 5-325 MG tablet  Commonly known as:  ROXICET     TYLENOL 8 HOUR ARTHRITIS PAIN 650 MG CR tablet  Generic drug:  acetaminophen     warfarin 5 MG tablet  Commonly known as:  COUMADIN      TAKE these medications        ascorbic acid 1000 MG tablet  Commonly known as:  VITAMIN C  Take 1,000 mg by mouth  every morning.     aspirin 325 MG EC tablet  Take 1 tablet (325 mg total) by mouth 2 (two) times daily.     docusate sodium 100 MG capsule  Commonly known as:  COLACE  Take 1 capsule (100 mg total) by mouth 2 (two) times daily.     ferrous sulfate 325 (65 FE) MG tablet  Take 1 tablet (325 mg total) by mouth 3 (three) times daily after meals.  hydrochlorothiazide 25 MG tablet  Commonly known as:  HYDRODIURIL  Take 1 tablet (25 mg total) by mouth daily.     HYDROcodone-acetaminophen 7.5-325 MG tablet  Commonly known as:  NORCO  Take 1-2 tablets by mouth every 4 (four) hours as needed for moderate pain.     LEVOTHROID 25 MCG tablet  Generic drug:  levothyroxine  Take 25 mcg by mouth daily before breakfast.     levothyroxine 200 MCG tablet  Commonly known as:  SYNTHROID, LEVOTHROID  Take 200 mcg by mouth daily before breakfast.     LIVALO 2 MG Tabs  Generic drug:  Pitavastatin Calcium  Take 2 mg by mouth at bedtime.     losartan 50 MG tablet  Commonly known as:  COZAAR  Take 50 mg by mouth daily.     magnesium oxide 400 MG tablet  Commonly known as:  MAG-OX  Take 400 mg by mouth daily.     methocarbamol 500 MG tablet  Commonly known as:  ROBAXIN  Take 1 tablet (500 mg total) by mouth every 6 (six) hours as needed for muscle spasms.     multivitamin tablet  Take 1 tablet by mouth every morning.     omeprazole 20 MG capsule  Commonly known as:  PRILOSEC  Take 20 mg by mouth 2 (two) times daily before a meal.     polyethylene glycol packet  Commonly known as:  MIRALAX / GLYCOLAX  Take 17 g by mouth 2 (two) times daily.         Signed: West Pugh. Demesha Boorman   PA-C  02/02/2016, 8:07 AM

## 2016-02-02 NOTE — Progress Notes (Signed)
Physical Therapy Treatment Patient Details Name: Samuel Goodwin MRN: [id number] DOB: 02-02-1961 Today's Date: 02/02/2016    History of Present Illness R THR - pt /p L TKR    PT Comments    Marked improvement in activity tolerance and confidence in ability.  Reviewed car transfers and stairs.  Follow Up Recommendations  Home health PT     Equipment Recommendations  None recommended by PT    Recommendations for Other Services OT consult     Precautions / Restrictions Precautions Precautions: Fall Restrictions Weight Bearing Restrictions: No RLE Weight Bearing: Weight bearing as tolerated    Mobility  Bed Mobility Overal bed mobility: Needs Assistance Bed Mobility: Supine to Sit;Sit to Supine Rolling: Supervision Sidelying to sit: Min guard   Sit to supine: Min assist   General bed mobility comments: Min cues for sequence.  Min assist with R LE into bed  Transfers Overall transfer level: Needs assistance Equipment used: Rolling walker (2 wheeled) Transfers: Sit to/from Stand Sit to Stand: Min guard         General transfer comment: cues for LE management and use of UEs to self assist  Ambulation/Gait Ambulation/Gait assistance: Min guard;Supervision Ambulation Distance (Feet): 150 Feet Assistive device: Rolling walker (2 wheeled) Gait Pattern/deviations: Step-to pattern;Step-through pattern;Decreased step length - right;Decreased step length - left;Shuffle;Trunk flexed Gait velocity: decr   General Gait Details: cues for posture, position from RW, ER on R and initial sequence   Stairs Stairs: Yes Stairs assistance: Min assist Stair Management: No rails;Step to pattern;Forwards;With walker Number of Stairs: 2 General stair comments: single step fwd twice with RW and cues for sequence and foot/RW placement  Wheelchair Mobility    Modified Rankin (Stroke Patients Only)       Balance                                    Cognition  Arousal/Alertness: Awake/alert Behavior During Therapy: WFL for tasks assessed/performed Overall Cognitive Status: Within Functional Limits for tasks assessed                      Exercises      General Comments        Pertinent Vitals/Pain Pain Assessment: 0-10 Pain Score: 5  Pain Location: R hip Pain Descriptors / Indicators: Aching;Sore Pain Intervention(s): Limited activity within patient's tolerance;Monitored during session;Premedicated before session;Ice applied    Home Living                      Prior Function            PT Goals (current goals can now be found in the care plan section) Acute Rehab PT Goals Patient Stated Goal: go home today PT Goal Formulation: With patient Time For Goal Achievement: 02/05/16 Potential to Achieve Goals: Good Progress towards PT goals: Progressing toward goals    Frequency  7X/week    PT Plan Current plan remains appropriate    Co-evaluation             End of Session Equipment Utilized During Treatment: Gait belt Activity Tolerance: Patient tolerated treatment well Patient left: in bed;with call bell/phone within reach     Time: 1352-1420 PT Time Calculation (min) (ACUTE ONLY): 28 min  Charges:  $Gait Training: 8-22 mins $Therapeutic Activity: 8-22 mins  G Codes:      Tameria Patti 02/22/2016, 6:02 PM

## 2016-02-02 NOTE — Evaluation (Signed)
Occupational Therapy Evaluation AND Discharge  Patient Details Name: Samuel Goodwin MRN: [id number] DOB: 1961/02/18 Today's Date: 02/02/2016    History of Present Illness R THR - pt /p L TKR   Clinical Impression   Patient admitted with above. Patient independent PTA. Patient currently functioning at an overall supervision to min assist level. Pt reports his wife can assist at home post acute d/c. No additional OT needs identified, D/C from acute OT services and no additional follow-up OT needs at this time. All appropriate education provided to patient and family (daughter). Please re-order OT if needed.      Follow Up Recommendations  No OT follow up;Supervision - Intermittent    Equipment Recommendations  Other (comment) (AE - reacher, sock aide, LH sponge)    Recommendations for Other Services  None at this time   Precautions / Restrictions Precautions Precautions: Fall Restrictions Weight Bearing Restrictions: Yes RLE Weight Bearing: Weight bearing as tolerated    Mobility Bed Mobility Overal bed mobility: Needs Assistance Bed Mobility: Rolling;Sidelying to Sit Rolling: Supervision Sidelying to sit: Min guard General bed mobility comments: Bed flat, use of rails. Cues for technique and sequencing. Min guard for sideyling to sit just for tactile support  Transfers Overall transfer level: Needs assistance Equipment used: Rolling walker (2 wheeled) Transfers: Sit to/from Stand Sit to Stand: Min assist General transfer comment: cues for LE management and use of UEs to self assist    Balance Overall balance assessment: Needs assistance Sitting-balance support: No upper extremity supported;Feet supported Sitting balance-Leahy Scale: Good     Standing balance support: Bilateral upper extremity supported;During functional activity Standing balance-Leahy Scale: Good    ADL Overall ADL's : Needs assistance/impaired General ADL Comments: Pt overall supervision to min  guard with ADLs using AE and DME to increase independence. Pt states his wife will be present to assist prn. Pt practiced toilet transfer using 3-n-1 (that of which he has a can sit over toilet seat) AND walk-in shower transfer using 3-n-1 in there as well.     Pertinent Vitals/Pain Pain Assessment: 0-10 Pain Score: 7  Pain Location: right hip when stepping over shower threshold  Pain Descriptors / Indicators: Aching;Sore Pain Intervention(s): Limited activity within patient's tolerance;Repositioned;Monitored during session     Hand Dominance Right   Extremity/Trunk Assessment Upper Extremity Assessment Upper Extremity Assessment: Overall WFL for tasks assessed   Lower Extremity Assessment Lower Extremity Assessment: Defer to PT evaluation   Cervical / Trunk Assessment Cervical / Trunk Assessment: Normal   Communication Communication Communication: No difficulties   Cognition Arousal/Alertness: Awake/alert Behavior During Therapy: WFL for tasks assessed/performed Overall Cognitive Status: Within Functional Limits for tasks assessed              Home Living Family/patient expects to be discharged to:: Private residence Living Arrangements: Spouse/significant other Available Help at Discharge: Family Type of Home: House Home Access: Stairs to enter Technical brewer of Steps: 1   Home Layout: Able to live on main level with bedroom/bathroom     Bathroom Shower/Tub: Walk-in shower;Door   [employer] Toilet: Standard     Home Equipment: Environmental consultant - 2 wheels;Cane - single point;Bedside commode;Shower seat - built in   Prior Functioning/Environment Level of Independence: Independent;Independent with assistive device(s)     OT Diagnosis: Generalized weakness;Acute pain   OT Problem List:   N/a, no acute OT needs identified at this time     OT Treatment/Interventions:   N/a, no acute OT needs identified at this time  OT Goals(Current goals can be found in the  care plan section) Acute Rehab OT Goals Patient Stated Goal: go home today OT Goal Formulation: All assessment and education complete, DC therapy  OT Frequency:   N/a, no acute OT needs identified at this time     Barriers to D/C:  None known at this time    End of Session Equipment Utilized During Treatment: Rolling walker  Activity Tolerance: Patient tolerated treatment well Patient left: in bed;with call bell/phone within reach;with family/visitor present (seated EOB)   Time: 1239-1300 OT Time Calculation (min): 21 min Charges:  OT General Charges $OT Visit: 1 Procedure OT Evaluation $OT Eval Low Complexity: 1 Procedure  Chrys Racer , MS, OTR/L, CLT Pager: [phone]  02/02/2016, 1:15 PM

## 2016-02-02 NOTE — Progress Notes (Signed)
     Subjective: 1 Day Post-Op Procedure(s) (LRB): RIGHT TOTAL HIP ARTHROPLASTY ANTERIOR APPROACH (Right)   Patient reports pain as mild, pain controlled. No events throughout the night.  Feeling pretty good now, better than prior to surgery.  Ready to be discharged home.  Objective:   VITALS:   Filed Vitals:   02/02/16 0240 02/02/16 0650  BP: 131/88 127/81  Pulse: 72 82  Temp: 97.7 F (36.5 C) 97.7 F (36.5 C)  Resp: 18 18    Dorsiflexion/Plantar flexion intact Incision: dressing C/D/I No cellulitis present Compartment soft  LABS  Recent Labs  02/02/16 0423  HGB 11.8*  HCT 35.8*  WBC 9.8  PLT 228     Recent Labs  02/02/16 0423  NA 137  K 4.1  BUN 15  CREATININE 0.81  GLUCOSE 175*     Assessment/Plan: 1 Day Post-Op Procedure(s) (LRB): RIGHT TOTAL HIP ARTHROPLASTY ANTERIOR APPROACH (Right) Foley cath d/c'ed Advance diet Up with therapy D/C IV fluids Discharge home with home health  Follow up in 2 weeks at [facility]. Follow up with OLIN,Eagan Shifflett D in 2 weeks.  Contact information:  [facility] [address] [place] [id number]    Obese (BMI 30-39.9) Estimated body mass index is 39.05 kg/(m^2) as calculated from the following:   Height as of this encounter: 6' (1.829 m).   Weight as of this encounter: 130.636 kg (288 lb). Patient also counseled that weight may inhibit the healing process Patient counseled that losing weight will help with future health issues        [place] Pugh. Christien Frankl   PAC  02/02/2016, 8:00 AM

## 2016-02-02 NOTE — Progress Notes (Signed)
RN asissted patient with home CPAP. RT will call [facility] to come and look and tag cord. RT will continue to monitor.

## 2016-02-02 NOTE — Care Management Note (Signed)
Case Management Note  Patient Details  Name: Samuel Goodwin MRN: [id number] Date of Birth: [birth year]  Subjective/Objective:                  RIGHT TOTAL HIP ARTHROPLASTY ANTERIOR APPROACH (Right) Action/Plan: dsicharge planning Expected Discharge Date:  02/02/16               Expected Discharge Plan:  [place]  In-House Referral:     Discharge planning Services  CM Consult  Post Acute Care Choice:  Home Health Choice offered to:  Patient  DME Arranged:  N/A DME Agency:  NA  HH Arranged:  PT HH Agency:  [facility]  Status of Service:  Completed, signed off  Medicare Important Message Given:    Date Medicare IM Given:    Medicare IM give by:    Date Additional Medicare IM Given:    Additional Medicare Important Message give by:     If discussed at [place] of Stay Meetings, dates discussed:    Additional Comments: CM met with pt in room to offer choice of home health agency.  Pt chooses [facility].  Referral called to [facility] and per [facility] request facesheet, face to face, orders, H&P, OP note, and DC summary faxed to [phone]; confirmation of receipt received.  No other CM needs were communicated. Dellie Catholic, RN 02/02/2016, 11:29 AM

## 2016-02-02 NOTE — Evaluation (Signed)
Physical Therapy Evaluation Patient Details Name: Samuel Goodwin MRN: [id number] DOB: [birth year] Today's Date: 02/02/2016   History of Present Illness  R THR - pt /p L TKR  Clinical Impression  Pt s/p R THR presents with decreased R LE strength/ROM and post op pain limiting functional mobility.  Pt should progress to dc home with family assist and HHPT follow up.    Follow Up Recommendations Home health PT    Equipment Recommendations  None recommended by PT    Recommendations for Other Services OT consult     Precautions / Restrictions Precautions Precautions: Fall Restrictions Weight Bearing Restrictions: No Other Position/Activity Restrictions: WBAT      Mobility  Bed Mobility Overal bed mobility: Needs Assistance Bed Mobility: Supine to Sit;Sit to Supine     Supine to sit: Min assist Sit to supine: Min assist   General bed mobility comments: cues for sequence and use of L LE to self assist  Transfers Overall transfer level: Needs assistance Equipment used: Rolling walker (2 wheeled) Transfers: Sit to/from Stand Sit to Stand: Min assist         General transfer comment: cues for LE management and use of UEs to self assist  Ambulation/Gait Ambulation/Gait assistance: Min assist Ambulation Distance (Feet): 200 Feet Assistive device: Rolling walker (2 wheeled) Gait Pattern/deviations: Step-to pattern;Step-through pattern;Decreased step length - right;Decreased step length - left;Shuffle;Trunk flexed Gait velocity: decr   General Gait Details: cues for posture, position from RW and initial sequence  Stairs            Wheelchair Mobility    Modified Rankin (Stroke Patients Only)       Balance                                             Pertinent Vitals/Pain Pain Assessment: 0-10 Pain Score: 4  Pain Location: R hip/thigh Pain Descriptors / Indicators: Aching;Sore Pain Intervention(s): Limited activity within patient's  tolerance;Monitored during session;Premedicated before session;Ice applied    Home Living Family/patient expects to be discharged to:: Private residence Living Arrangements: Spouse/significant other Available Help at Discharge: Family Type of Home: House Home Access: Stairs to enter   Technical brewer of Steps: 1 Home Layout: Able to live on main level with bedroom/bathroom Home Equipment: Walker - 2 wheels;Cane - single point;Bedside commode      Prior Function Level of Independence: Independent;Independent with assistive device(s)               Hand Dominance        Extremity/Trunk Assessment   Upper Extremity Assessment: Overall WFL for tasks assessed           Lower Extremity Assessment: RLE deficits/detail;LLE deficits/detail RLE Deficits / Details: Strength at hip 2+/5 with AAROM at hip to 85 flex and 20 abd LLE Deficits / Details: Strength WFL with knee flex ltd to ~ 80  Cervical / Trunk Assessment: Normal  Communication   Communication: No difficulties  Cognition Arousal/Alertness: Awake/alert Behavior During Therapy: WFL for tasks assessed/performed Overall Cognitive Status: Within Functional Limits for tasks assessed                      General Comments      Exercises Total Joint Exercises Ankle Circles/Pumps: AROM;Both;15 reps;Supine Quad Sets: AROM;Both;10 reps;Supine Heel Slides: AAROM;20 reps;Right;Supine Hip ABduction/ADduction: AAROM;Right;15 reps;Supine  Assessment/Plan    PT Assessment Patient needs continued PT services  PT Diagnosis Difficulty walking   PT Problem List Decreased strength;Decreased range of motion;Decreased activity tolerance;Decreased mobility;Decreased knowledge of use of DME;Pain  PT Treatment Interventions DME instruction;Gait training;Stair training;Patient/family education;Functional mobility training;Therapeutic activities;Therapeutic exercise   PT Goals (Current goals can be found in the  Care Plan section) Acute Rehab PT Goals Patient Stated Goal: Regain IND  PT Goal Formulation: With patient Time For Goal Achievement: 02/05/16 Potential to Achieve Goals: Good    Frequency 7X/week   Barriers to discharge        Co-evaluation               End of Session Equipment Utilized During Treatment: Gait belt Activity Tolerance: Patient tolerated treatment well Patient left: in bed;with call bell/phone within reach;with family/visitor present Nurse Communication: Mobility status         Time: [id number] PT Time Calculation (min) (ACUTE ONLY): 39 min   Charges:   PT Evaluation $PT Eval Low Complexity: 1 Procedure PT Treatments $Gait Training: 8-22 mins $Therapeutic Exercise: 8-22 mins   PT G Codes:        Samuel Goodwin 2016-03-03, 12:50 PM

## 2016-02-05 ENCOUNTER — Encounter (HOSPITAL_COMMUNITY)
Admission: EM | Disposition: A | Discharge status: Home Health | Payer: Self-pay | Source: Home / Self Care | Attending: Orthopedic Surgery

## 2016-02-05 ENCOUNTER — Emergency Department (HOSPITAL_COMMUNITY): Payer: Managed Care, Other (non HMO) | Admitting: Anesthesiology

## 2016-02-05 ENCOUNTER — Encounter (HOSPITAL_COMMUNITY): Payer: Self-pay | Admitting: Emergency Medicine

## 2016-02-05 ENCOUNTER — Emergency Department (HOSPITAL_COMMUNITY): Payer: Managed Care, Other (non HMO)

## 2016-02-05 ENCOUNTER — Inpatient Hospital Stay (HOSPITAL_COMMUNITY)
Admission: EM | Admit: 2016-02-05 | Discharge: 2016-02-09 | DRG: 468 | Disposition: A | Discharge status: Home Health | Payer: Managed Care, Other (non HMO) | Attending: Orthopedic Surgery | Admitting: Orthopedic Surgery

## 2016-02-05 DIAGNOSIS — Z96649 Presence of unspecified artificial hip joint: Secondary | ICD-10-CM

## 2016-02-05 DIAGNOSIS — M25359 Other instability, unspecified hip: Secondary | ICD-10-CM

## 2016-02-05 DIAGNOSIS — K219 Gastro-esophageal reflux disease without esophagitis: Secondary | ICD-10-CM | POA: Diagnosis present

## 2016-02-05 DIAGNOSIS — G4733 Obstructive sleep apnea (adult) (pediatric): Secondary | ICD-10-CM | POA: Diagnosis present

## 2016-02-05 DIAGNOSIS — E785 Hyperlipidemia, unspecified: Secondary | ICD-10-CM | POA: Diagnosis present

## 2016-02-05 DIAGNOSIS — E039 Hypothyroidism, unspecified: Secondary | ICD-10-CM | POA: Diagnosis present

## 2016-02-05 DIAGNOSIS — Z96652 Presence of left artificial knee joint: Secondary | ICD-10-CM | POA: Diagnosis present

## 2016-02-05 DIAGNOSIS — Z6839 Body mass index (BMI) 39.0-39.9, adult: Secondary | ICD-10-CM

## 2016-02-05 DIAGNOSIS — Y792 Prosthetic and other implants, materials and accessory orthopedic devices associated with adverse incidents: Secondary | ICD-10-CM | POA: Diagnosis present

## 2016-02-05 DIAGNOSIS — E669 Obesity, unspecified: Secondary | ICD-10-CM | POA: Diagnosis present

## 2016-02-05 DIAGNOSIS — T84020A Dislocation of internal right hip prosthesis, initial encounter: Secondary | ICD-10-CM | POA: Diagnosis present

## 2016-02-05 DIAGNOSIS — I10 Essential (primary) hypertension: Secondary | ICD-10-CM | POA: Diagnosis present

## 2016-02-05 DIAGNOSIS — Z419 Encounter for procedure for purposes other than remedying health state, unspecified: Secondary | ICD-10-CM

## 2016-02-05 DIAGNOSIS — S73004A Unspecified dislocation of right hip, initial encounter: Secondary | ICD-10-CM

## 2016-02-05 DIAGNOSIS — M25551 Pain in right hip: Secondary | ICD-10-CM | POA: Diagnosis present

## 2016-02-05 HISTORY — DX: Other instability, unspecified hip: M25.359

## 2016-02-05 HISTORY — PX: HIP CLOSED REDUCTION: SHX983

## 2016-02-05 HISTORY — DX: Presence of unspecified artificial hip joint: Z96.649

## 2016-02-05 LAB — I-STAT CHEM 8, ED
BUN: 12 mg/dL (ref 6–20)
Calcium, Ion: 1.07 mmol/L — ABNORMAL LOW (ref 1.12–1.23)
Chloride: 94 mmol/L — ABNORMAL LOW (ref 101–111)
Creatinine, Ser: 0.7 mg/dL (ref 0.61–1.24)
GLUCOSE: 103 mg/dL — AB (ref 65–99)
HEMATOCRIT: 33 % — AB (ref 39.0–52.0)
HEMOGLOBIN: 11.2 g/dL — AB (ref 13.0–17.0)
POTASSIUM: 3.1 mmol/L — AB (ref 3.5–5.1)
SODIUM: 137 mmol/L (ref 135–145)
TCO2: 28 mmol/L (ref 0–100)

## 2016-02-05 SURGERY — CLOSED MANIPULATION, JOINT, HIP
Anesthesia: General | Site: Hip | Laterality: Right

## 2016-02-05 MED ORDER — SODIUM CHLORIDE 0.9 % IV BOLUS (SEPSIS)
1000.0000 mL | Freq: Once | INTRAVENOUS | Status: AC
  Administered 2016-02-05: 1000 mL via INTRAVENOUS

## 2016-02-05 MED ORDER — FENTANYL CITRATE (PF) 100 MCG/2ML IJ SOLN
INTRAMUSCULAR | Status: AC
  Filled 2016-02-05: qty 2

## 2016-02-05 MED ORDER — NON FORMULARY: 2.0000 mg | Freq: Every day | Status: DC

## 2016-02-05 MED ORDER — ONDANSETRON HCL 4 MG/2ML IJ SOLN: 4.0000 mg | Freq: Four times a day (QID) | INTRAMUSCULAR | Status: DC | PRN

## 2016-02-05 MED ORDER — PRAVASTATIN SODIUM 40 MG PO TABS
40.0000 mg | ORAL_TABLET | Freq: Every day | ORAL | Status: DC
  Administered 2016-02-05 – 2016-02-08 (×4): 40 mg via ORAL
  Filled 2016-02-05 (×6): qty 1

## 2016-02-05 MED ORDER — LACTATED RINGERS IV SOLN
INTRAVENOUS | Status: DC
  Administered 2016-02-05: 50 mL/h via INTRAVENOUS

## 2016-02-05 MED ORDER — PANTOPRAZOLE SODIUM 40 MG PO TBEC
40.0000 mg | DELAYED_RELEASE_TABLET | Freq: Every day | ORAL | Status: DC
Start: 2016-02-05 — End: 2016-02-08
  Administered 2016-02-05 – 2016-02-08 (×5): 40 mg via ORAL
  Filled 2016-02-05 (×5): qty 1

## 2016-02-05 MED ORDER — LACTATED RINGERS IV SOLN
INTRAVENOUS | Status: DC | PRN
  Administered 2016-02-05: 13:00:00 via INTRAVENOUS

## 2016-02-05 MED ORDER — POLYETHYLENE GLYCOL 3350 17 G PO PACK
17.0000 g | PACK | Freq: Two times a day (BID) | ORAL | Status: DC
  Administered 2016-02-07 – 2016-02-09 (×3): 17 g via ORAL

## 2016-02-05 MED ORDER — OXYCODONE HCL 5 MG PO TABS: 5.0000 mg | ORAL_TABLET | Freq: Once | ORAL | Status: DC | PRN

## 2016-02-05 MED ORDER — POLYETHYLENE GLYCOL 3350 17 G PO PACK: 17.0000 g | PACK | Freq: Every day | ORAL | Status: DC | PRN

## 2016-02-05 MED ORDER — HYDROCHLOROTHIAZIDE 25 MG PO TABS
25.0000 mg | ORAL_TABLET | Freq: Every day | ORAL | Status: DC
  Administered 2016-02-06 – 2016-02-09 (×3): 25 mg via ORAL
  Filled 2016-02-05 (×4): qty 1

## 2016-02-05 MED ORDER — PROPOFOL 10 MG/ML IV BOLUS
INTRAVENOUS | Status: DC | PRN
  Administered 2016-02-05: 200 mg via INTRAVENOUS

## 2016-02-05 MED ORDER — HYDROCODONE-ACETAMINOPHEN 7.5-325 MG PO TABS
1.0000 | ORAL_TABLET | ORAL | Status: DC | PRN
  Administered 2016-02-06: 1 via ORAL
  Administered 2016-02-06 – 2016-02-07 (×3): 2 via ORAL
  Administered 2016-02-07: 1 via ORAL
  Filled 2016-02-05: qty 1
  Filled 2016-02-05: qty 2
  Filled 2016-02-05: qty 1
  Filled 2016-02-05 (×2): qty 2

## 2016-02-05 MED ORDER — ACETAMINOPHEN 10 MG/ML IV SOLN
1000.0000 mg | Freq: Four times a day (QID) | INTRAVENOUS | Status: AC | PRN
  Administered 2016-02-05 – 2016-02-06 (×3): 1000 mg via INTRAVENOUS
  Filled 2016-02-05 (×7): qty 100

## 2016-02-05 MED ORDER — MORPHINE SULFATE (PF) 4 MG/ML IV SOLN
6.0000 mg | Freq: Once | INTRAVENOUS | Status: AC
  Administered 2016-02-05: 6 mg via INTRAVENOUS
  Filled 2016-02-05: qty 2

## 2016-02-05 MED ORDER — ACETAMINOPHEN 325 MG PO TABS
650.0000 mg | ORAL_TABLET | Freq: Four times a day (QID) | ORAL | Status: DC | PRN
  Administered 2016-02-06: 650 mg via ORAL
  Filled 2016-02-05: qty 2

## 2016-02-05 MED ORDER — PROPOFOL 10 MG/ML IV BOLUS
INTRAVENOUS | Status: AC
  Filled 2016-02-05: qty 20

## 2016-02-05 MED ORDER — DEXTROSE 5 % IV SOLN
500.0000 mg | Freq: Four times a day (QID) | INTRAVENOUS | Status: DC | PRN
  Administered 2016-02-05 – 2016-02-06 (×4): 500 mg via INTRAVENOUS
  Filled 2016-02-05 (×9): qty 5

## 2016-02-05 MED ORDER — ONDANSETRON HCL 4 MG/2ML IJ SOLN
INTRAMUSCULAR | Status: DC | PRN
  Administered 2016-02-05: 4 mg via INTRAVENOUS

## 2016-02-05 MED ORDER — BISACODYL 5 MG PO TBEC: 5.0000 mg | DELAYED_RELEASE_TABLET | Freq: Every day | ORAL | Status: DC | PRN

## 2016-02-05 MED ORDER — DOCUSATE SODIUM 100 MG PO CAPS
100.0000 mg | ORAL_CAPSULE | Freq: Two times a day (BID) | ORAL | Status: DC
  Administered 2016-02-05 – 2016-02-09 (×6): 100 mg via ORAL

## 2016-02-05 MED ORDER — PROMETHAZINE HCL 25 MG/ML IJ SOLN: 6.2500 mg | INTRAMUSCULAR | Status: DC | PRN

## 2016-02-05 MED ORDER — LEVOTHYROXINE SODIUM 200 MCG PO TABS: 200.0000 ug | ORAL_TABLET | Freq: Every day | ORAL | Status: DC

## 2016-02-05 MED ORDER — ACETAMINOPHEN 650 MG RE SUPP: 650.0000 mg | Freq: Four times a day (QID) | RECTAL | Status: DC | PRN

## 2016-02-05 MED ORDER — ONDANSETRON HCL 4 MG/2ML IJ SOLN
4.0000 mg | Freq: Once | INTRAMUSCULAR | Status: AC
  Administered 2016-02-05: 4 mg via INTRAVENOUS
  Filled 2016-02-05: qty 2

## 2016-02-05 MED ORDER — METHOCARBAMOL 500 MG PO TABS
500.0000 mg | ORAL_TABLET | Freq: Four times a day (QID) | ORAL | Status: DC | PRN
  Administered 2016-02-06 – 2016-02-09 (×7): 500 mg via ORAL
  Filled 2016-02-05 (×2): qty 1

## 2016-02-05 MED ORDER — MORPHINE SULFATE (PF) 4 MG/ML IV SOLN
4.0000 mg | Freq: Once | INTRAVENOUS | Status: AC
  Administered 2016-02-05: 4 mg via INTRAVENOUS
  Filled 2016-02-05: qty 1

## 2016-02-05 MED ORDER — ONDANSETRON HCL 4 MG/2ML IJ SOLN
INTRAMUSCULAR | Status: AC
  Filled 2016-02-05: qty 2

## 2016-02-05 MED ORDER — LIDOCAINE HCL (CARDIAC) 20 MG/ML IV SOLN
INTRAVENOUS | Status: AC
  Filled 2016-02-05: qty 5

## 2016-02-05 MED ORDER — LIDOCAINE HCL (CARDIAC) 20 MG/ML IV SOLN
INTRAVENOUS | Status: DC | PRN
  Administered 2016-02-05: 100 mg via INTRAVENOUS

## 2016-02-05 MED ORDER — FENTANYL CITRATE (PF) 100 MCG/2ML IJ SOLN
25.0000 ug | INTRAMUSCULAR | Status: DC | PRN
  Administered 2016-02-05 (×3): 50 ug via INTRAVENOUS

## 2016-02-05 MED ORDER — METOCLOPRAMIDE HCL 10 MG PO TABS: 5.0000 mg | ORAL_TABLET | Freq: Three times a day (TID) | ORAL | Status: DC | PRN

## 2016-02-05 MED ORDER — SUCCINYLCHOLINE CHLORIDE 20 MG/ML IJ SOLN
INTRAMUSCULAR | Status: DC | PRN
  Administered 2016-02-05 (×2): 100 mg via INTRAVENOUS

## 2016-02-05 MED ORDER — PANTOPRAZOLE SODIUM 40 MG PO TBEC: 40.0000 mg | DELAYED_RELEASE_TABLET | Freq: Every day | ORAL | Status: DC

## 2016-02-05 MED ORDER — LEVOTHYROXINE SODIUM 25 MCG PO TABS: 25.0000 ug | ORAL_TABLET | Freq: Every day | ORAL | Status: DC

## 2016-02-05 MED ORDER — ASPIRIN EC 325 MG PO TBEC
325.0000 mg | DELAYED_RELEASE_TABLET | Freq: Two times a day (BID) | ORAL | Status: DC
  Administered 2016-02-06: 325 mg via ORAL
  Filled 2016-02-05 (×3): qty 1

## 2016-02-05 MED ORDER — ONDANSETRON HCL 4 MG PO TABS
4.0000 mg | ORAL_TABLET | Freq: Four times a day (QID) | ORAL | Status: DC | PRN
  Administered 2016-02-09: 4 mg via ORAL
  Filled 2016-02-05: qty 1

## 2016-02-05 MED ORDER — LEVOTHYROXINE SODIUM 25 MCG PO TABS
25.0000 ug | ORAL_TABLET | Freq: Every day | ORAL | Status: DC
  Administered 2016-02-06 – 2016-02-09 (×4): 25 ug via ORAL
  Filled 2016-02-05 (×5): qty 1

## 2016-02-05 MED ORDER — FERROUS SULFATE 325 (65 FE) MG PO TABS
325.0000 mg | ORAL_TABLET | Freq: Three times a day (TID) | ORAL | Status: DC
  Administered 2016-02-08 – 2016-02-09 (×2): 325 mg via ORAL
  Filled 2016-02-05 (×13): qty 1

## 2016-02-05 MED ORDER — METHOCARBAMOL 500 MG PO TABS
500.0000 mg | ORAL_TABLET | Freq: Four times a day (QID) | ORAL | Status: DC | PRN
  Filled 2016-02-05 (×5): qty 1

## 2016-02-05 MED ORDER — LOSARTAN POTASSIUM 50 MG PO TABS
50.0000 mg | ORAL_TABLET | Freq: Every day | ORAL | Status: DC
  Administered 2016-02-06 – 2016-02-09 (×3): 50 mg via ORAL
  Filled 2016-02-05 (×4): qty 1

## 2016-02-05 MED ORDER — LEVOTHYROXINE SODIUM 200 MCG PO TABS
200.0000 ug | ORAL_TABLET | Freq: Every day | ORAL | Status: DC
  Administered 2016-02-06 – 2016-02-09 (×4): 200 ug via ORAL
  Filled 2016-02-05 (×5): qty 1

## 2016-02-05 MED ORDER — OXYCODONE HCL 5 MG/5ML PO SOLN
5.0000 mg | Freq: Once | ORAL | Status: DC | PRN
  Filled 2016-02-05: qty 5

## 2016-02-05 MED ORDER — METOCLOPRAMIDE HCL 5 MG/ML IJ SOLN: 5.0000 mg | Freq: Three times a day (TID) | INTRAMUSCULAR | Status: DC | PRN

## 2016-02-05 MED ORDER — FENTANYL CITRATE (PF) 100 MCG/2ML IJ SOLN
INTRAMUSCULAR | Status: DC | PRN
  Administered 2016-02-05 (×2): 25 ug via INTRAVENOUS

## 2016-02-05 NOTE — Anesthesia Procedure Notes (Signed)
Procedure Name: Intubation Date/Time: 02/05/2016 1:34 PM Performed by: Danley Danker L Patient Re-evaluated:Patient Re-evaluated prior to inductionOxygen Delivery Method: Circle system utilized Intubation Type: IV induction Ventilation: Mask ventilation without difficulty and Oral airway inserted - appropriate to patient size Laryngoscope Size: Glidescope Grade View: Grade I Tube type: Glide rite Tube size: 7.5 mm Number of attempts: 1 Airway Equipment and Method: Video-laryngoscopy Placement Confirmation: ETT inserted through vocal cords under direct vision,  breath sounds checked- equal and bilateral and positive ETCO2 Secured at: 21 cm Tube secured with: Tape Dental Injury: Teeth and Oropharynx as per pre-operative assessment

## 2016-02-05 NOTE — Transfer of Care (Signed)
Immediate Anesthesia Transfer of Care Note  Patient: Samuel Goodwin  Procedure(s) Performed: Procedure(s): CLOSED MANIPULATION HIP (Right)  Patient Location: PACU  Anesthesia Type:General  Level of Consciousness: sedated  Airway & Oxygen Therapy: Patient Spontanous Breathing and Patient connected to face mask oxygen  Post-op Assessment: Report given to RN and Post -op Vital signs reviewed and stable  Post vital signs: Reviewed and stable  Last Vitals:  Filed Vitals:   02/05/16 1030 02/05/16 1145  BP:  120/69  Pulse: 80 82  Temp:  37.1 C  Resp:  18    Complications: No apparent anesthesia complications

## 2016-02-05 NOTE — Anesthesia Postprocedure Evaluation (Signed)
Anesthesia Post Note  Patient: Samuel Goodwin  Procedure(s) Performed: Procedure(s) (LRB): CLOSED MANIPULATION HIP (Right)  Patient location during evaluation: PACU Anesthesia Type: General Level of consciousness: awake and alert Pain management: pain level controlled Vital Signs Assessment: post-procedure vital signs reviewed and stable Respiratory status: spontaneous breathing, nonlabored ventilation, respiratory function stable and patient connected to nasal cannula oxygen Cardiovascular status: blood pressure returned to baseline and stable Postop Assessment: no signs of nausea or vomiting Anesthetic complications: no    Last Vitals:  Filed Vitals:   02/05/16 1419 02/05/16 1424  BP: 142/91 157/100  Pulse: 87 92  Temp:    Resp: 19 22    Last Pain:  Filed Vitals:   02/05/16 1427  PainSc: 7                  Zenaida Deed

## 2016-02-05 NOTE — H&P (Signed)
Samuel Goodwin is an 55 y.o. male.   Chief Complaint: R hip pain HPI: s/p R THA AA by Dr. Alvan Dame 02/01/16. Reports he rolled over in bed and felt sudden pain in the hip. Presented to ER and xrays show dislocation. Last ate at 8pm last night.  On ASA for DVT ppx  Past Medical History  Diagnosis Date  . Hyperlipidemia   . Hypertension   . Hypothyroidism   . GERD (gastroesophageal reflux disease)   . Alcohol abuse     Sobriety since 1990  . Benign positional vertigo   . OSA (obstructive sleep apnea)     on CPAP  . Anxiety   . Depression   . PTSD (post-traumatic stress disorder)   . Arthritis   . Hepatitis C 1994    hep c - ? etiology; in [place] 1989-91; S/P interferon , Dr [name]  . PONV (postoperative nausea and vomiting)     following knee surgery last year   . History of bronchitis   . Eardrum trauma     30 years ago/right ear  . Hoarseness   . Headache   . Vocal cord polyps     Past Surgical History  Procedure Laterality Date  . Throat surgery      vocal cord  laser sugery X 3, Dr [name] , [facility]  . Upper gastrointestinal endoscopy  2010    Negative, Dr.[name]  . Laryngoscopy  08/2009    Dr.[name]  . Cardiovascular stress test  02/09/2010    No scintigraphic evidence of inducible ischemia.  . Transthoracic echocardiogram  11/08/2005    EF 68%, normal LV systolic function  . Knee arthroscopic Bilateral   . Total knee arthroplasty Left 11/27/2014    dr [name]  . Total knee arthroplasty Left 11/27/2014    Procedure: LEFT TOTAL KNEE ARTHROPLASTY;  Surgeon: Augustin Schooling, MD;  Location: [facility];  Service: Orthopedics;  Laterality: Left;  . Total hip arthroplasty Right 02/01/2016    Procedure: RIGHT TOTAL HIP ARTHROPLASTY ANTERIOR APPROACH;  Surgeon: Paralee Cancel, MD;  Location: [facility];  Service: Orthopedics;  Laterality: Right;    Family History  Problem Relation Age of Onset  . Cirrhosis Father   . Diabetes Father   . Stroke Father   . Heart failure Mother   .  Hypertension Mother   . Heart attack Brother 74  . Aneurysm Maternal Aunt      AAA  . Coronary artery disease Maternal Uncle     MI late 44s  . Thyroid disease Mother    Social History:  reports that he has never smoked. He has never used smokeless tobacco. He reports that he does not drink alcohol or use illicit drugs.  Allergies:  Allergies  Allergen Reactions  . Dilaudid [Hydromorphone Hcl] Nausea And Vomiting    "Questionable as to whether it was actually dilaudid  That made me so severely nauseous and vomit"  . Benazepril Hcl Cough     (Not in a hospital admission)  Results for orders placed or performed during the hospital encounter of 02/05/16 (from the past 48 hour(s))  I-stat chem 8, ed     Status: Abnormal   Collection Time: 02/05/16  8:55 AM  Result Value Ref Range   Sodium 137 135 - 145 mmol/L   Potassium 3.1 (L) 3.5 - 5.1 mmol/L   Chloride 94 (L) 101 - 111 mmol/L   BUN 12 6 - 20 mg/dL   Creatinine, Ser 0.70 0.61 - 1.24  mg/dL   Glucose, Bld 103 (H) 65 - 99 mg/dL   Calcium, Ion 1.07 (L) 1.12 - 1.23 mmol/L   TCO2 28 0 - 100 mmol/L   Hemoglobin 11.2 (L) 13.0 - 17.0 g/dL   HCT 33.0 (L) 39.0 - 52.0 %   Dg Hip Unilat With Pelvis 2-3 Views Right  02/05/2016  CLINICAL DATA:  55 year old male with acute right hip pain. Patient with total right hip arthroplasty on 02/01/2016. It. EXAM: DG HIP (WITH OR WITHOUT PELVIS) 2-3V RIGHT COMPARISON:  02/01/2016 FINDINGS: Right total hip arthroplasty identified. Superior lateral dislocation of the femoral head component identified. There is no evidence of acute fracture. IMPRESSION: Superior lateral dislocation of the femoral head component of the right hip arthroplasty. Electronically Signed   By: Margarette Canada M.D.   On: 02/05/2016 09:50    Review of Systems  Constitutional: Negative.   HENT: Negative.   Eyes: Negative.   Respiratory: Negative.   Cardiovascular: Negative.   Gastrointestinal: Negative.   Genitourinary: Negative.    Musculoskeletal: Positive for joint pain.  Skin: Negative.   Neurological: Negative.   Psychiatric/Behavioral: Negative.     Blood pressure 132/80, pulse 77, temperature 98.5 F (36.9 C), temperature source Oral, resp. rate 18, SpO2 99 %. Physical Exam  Constitutional: He is oriented to person, place, and time. He appears well-developed. He appears distressed.  HENT:  Head: Normocephalic.  Eyes: Pupils are equal, round, and reactive to light.  Neck: Normal range of motion.  Cardiovascular: Normal rate.   Respiratory: Effort normal.  GI: Soft.  Musculoskeletal:  R leg shortened and externally rotated  Neurological: He is alert and oriented to person, place, and time.  Skin: Skin is warm and dry.    xrays positive for R THA dislocation  Assessment/Plan Dislocated R THA  Pain control Keep NPO To OR today for closed reduction Right total hip by Dr. Gladstone Lighter Dr. Gladstone Lighter discussed risks and complications with patient  Cecilie Kicks., PA-C for Dr. Gladstone Lighter 02/05/2016, 10:44 AM

## 2016-02-05 NOTE — ED Notes (Signed)
Pt taken to x-ray and returned to room without distress noted. 

## 2016-02-05 NOTE — ED Notes (Signed)
Bed: [id number] Expected date:  Expected time:  Means of arrival:  Comments: EMS- Hip pain, recent surgery

## 2016-02-05 NOTE — Interval H&P Note (Signed)
History and Physical Interval Note:  02/05/2016 1:30 PM  Samuel Goodwin  has presented today for surgery, with the diagnosis of dislocated right hip  The various methods of treatment have been discussed with the patient and family. After consideration of risks, benefits and other options for treatment, the patient has consented to  Procedure(s): CLOSED MANIPULATION HIP (Right) as a surgical intervention .  The patient's history has been reviewed, patient examined, no change in status, stable for surgery.  I have reviewed the patient's chart and labs.  Questions were answered to the patient's satisfaction.     [name] A

## 2016-02-05 NOTE — Anesthesia Preprocedure Evaluation (Signed)
Anesthesia Evaluation  Patient identified by MRN, date of birth, ID band Patient awake    Reviewed: Allergy & Precautions, NPO status , Patient's Chart, lab work & pertinent test results  History of Anesthesia Complications (+) PONV and history of anesthetic complications  Airway Mallampati: II  TM Distance: >3 FB Neck ROM: Full    Dental   Pulmonary sleep apnea ,    breath sounds clear to auscultation       Cardiovascular hypertension,  Rhythm:Regular Rate:Normal     Neuro/Psych Anxiety Depression    GI/Hepatic GERD  ,(+) Hepatitis -  Endo/Other  Hypothyroidism   Renal/GU      Musculoskeletal   Abdominal   Peds  Hematology   Anesthesia Other Findings   Reproductive/Obstetrics                             Anesthesia Physical  Anesthesia Plan  ASA: III  Anesthesia Plan: General   Post-op Pain Management:    Induction: Intravenous  Airway Management Planned: Oral ETT  Additional Equipment:   Intra-op Plan:   Post-operative Plan: Extubation in OR  Informed Consent: I have reviewed the patients History and Physical, chart, labs and discussed the procedure including the risks, benefits and alternatives for the proposed anesthesia with the patient or authorized representative who has indicated his/her understanding and acceptance.   Dental advisory given  Plan Discussed with: CRNA and Anesthesiologist  Anesthesia Plan Comments:         Anesthesia Quick Evaluation

## 2016-02-05 NOTE — ED Notes (Signed)
Pt arrived via EMS with report of rt hip pain s/p to attempting to roll over in bed. Pt denies injury/falls. Recent hip replacement on Tuesday. Noted swelling in rt hip area, outer rotation, (+)PMS, CRT brisk, noted leg length discrepency, LROM. Ice applied. Pt was given Norco enroute.

## 2016-02-05 NOTE — ED Provider Notes (Signed)
CSN: [id number]     Arrival date & time 02/05/16  0815 History   First MD Initiated Contact with Patient 02/05/16 0820     Chief Complaint  Patient presents with  . Hip Pain     (Consider location/radiation/quality/duration/timing/severity/associated sxs/prior Treatment) Patient is a 55 y.o. male presenting with hip pain. The history is provided by the patient (Patient states that he rolled over in bed and felt like his hip without a place).  Hip Pain This is a new problem. The current episode started 3 to 5 hours ago. The problem occurs constantly. The problem has not changed since onset.Associated symptoms include abdominal pain. Pertinent negatives include no chest pain and no headaches. Exacerbated by: Movement. Nothing relieves the symptoms.    Past Medical History  Diagnosis Date  . Hyperlipidemia   . Hypertension   . Hypothyroidism   . GERD (gastroesophageal reflux disease)   . Alcohol abuse     Sobriety since 1990  . Benign positional vertigo   . OSA (obstructive sleep apnea)     on CPAP  . Anxiety   . Depression   . PTSD (post-traumatic stress disorder)   . Arthritis   . Hepatitis C 1994    hep c - ? etiology; in [place] 1989-91; S/P interferon , Dr [name]  . PONV (postoperative nausea and vomiting)     following knee surgery last year   . History of bronchitis   . Eardrum trauma     30 years ago/right ear  . Hoarseness   . Headache   . Vocal cord polyps    Past Surgical History  Procedure Laterality Date  . Throat surgery      vocal cord  laser sugery X 3, Dr [name] , [facility]  . Upper gastrointestinal endoscopy  2010    Negative, Dr.[name]  . Laryngoscopy  08/2009    Dr.[name]  . Cardiovascular stress test  02/09/2010    No scintigraphic evidence of inducible ischemia.  . Transthoracic echocardiogram  11/08/2005    EF 68%, normal LV systolic function  . Knee arthroscopic Bilateral   . Total knee arthroplasty Left 11/27/2014    dr [name]  . Total knee  arthroplasty Left 11/27/2014    Procedure: LEFT TOTAL KNEE ARTHROPLASTY;  Surgeon: Augustin Schooling, MD;  Location: [facility];  Service: Orthopedics;  Laterality: Left;  . Total hip arthroplasty Right 02/01/2016    Procedure: RIGHT TOTAL HIP ARTHROPLASTY ANTERIOR APPROACH;  Surgeon: Paralee Cancel, MD;  Location: [facility];  Service: Orthopedics;  Laterality: Right;   Family History  Problem Relation Age of Onset  . Cirrhosis Father   . Diabetes Father   . Stroke Father   . Heart failure Mother   . Hypertension Mother   . Heart attack Brother 38  . Aneurysm Maternal Aunt      AAA  . Coronary artery disease Maternal Uncle     MI late 12s  . Thyroid disease Mother    Social History  Substance Use Topics  . Smoking status: Never Smoker   . Smokeless tobacco: Never Used     Comment: Quit at age 44  . Alcohol Use: No     Comment: alcoholism quit 30 years ago     Review of Systems  Constitutional: Negative for appetite change and fatigue.  HENT: Negative for congestion, ear discharge and sinus pressure.   Eyes: Negative for discharge.  Respiratory: Negative for cough.   Cardiovascular: Negative for chest pain.  Gastrointestinal: Positive  for abdominal pain. Negative for diarrhea.  Genitourinary: Negative for frequency and hematuria.  Musculoskeletal: Negative for back pain.       Right hip pain  Skin: Negative for rash.  Neurological: Negative for seizures and headaches.  Psychiatric/Behavioral: Negative for hallucinations.      Allergies  Dilaudid and Benazepril hcl  Home Medications   Prior to Admission medications   Medication Sig Start Date End Date Taking? Authorizing Provider  aspirin EC 325 MG EC tablet Take 1 tablet (325 mg total) by mouth 2 (two) times daily. 02/02/16 03/01/16 Yes Matthew Babish, PA-C  docusate sodium (COLACE) 100 MG capsule Take 1 capsule (100 mg total) by mouth 2 (two) times daily. 02/02/16  Yes Danae Orleans, PA-C  hydrochlorothiazide (HYDRODIURIL) 25  MG tablet Take 1 tablet (25 mg total) by mouth daily. Patient taking differently: Take 25 mg by mouth every morning.  02/01/12  Yes Hendricks Limes, MD  HYDROcodone-acetaminophen [facility]) 7.5-325 MG tablet Take 1-2 tablets by mouth every 4 (four) hours as needed for moderate pain. 02/02/16  Yes Danae Orleans, PA-C  levothyroxine (LEVOTHROID) 25 MCG tablet Take 25 mcg by mouth daily before breakfast.   Yes Historical Provider, MD  levothyroxine (SYNTHROID, LEVOTHROID) 200 MCG tablet Take 200 mcg by mouth daily before breakfast.   Yes Historical Provider, MD  losartan (COZAAR) 50 MG tablet Take 50 mg by mouth daily.   Yes Historical Provider, MD  magnesium oxide (MAG-OX) 400 MG tablet Take 400 mg by mouth daily.   Yes Historical Provider, MD  methocarbamol (ROBAXIN) 500 MG tablet Take 1 tablet (500 mg total) by mouth every 6 (six) hours as needed for muscle spasms. 02/02/16  Yes Danae Orleans, PA-C  Multiple Vitamin (MULTIVITAMIN) tablet Take 1 tablet by mouth every morning.    Yes Historical Provider, MD  omeprazole (PRILOSEC) 20 MG capsule Take 20 mg by mouth 2 (two) times daily before a meal.  02/01/12  Yes Hendricks Limes, MD  Pitavastatin Calcium (LIVALO) 2 MG TABS Take 2 mg by mouth at bedtime.   Yes Historical Provider, MD  polyethylene glycol (MIRALAX / GLYCOLAX) packet Take 17 g by mouth 2 (two) times daily. 02/02/16  Yes Danae Orleans, PA-C  ferrous sulfate 325 (65 FE) MG tablet Take 1 tablet (325 mg total) by mouth 3 (three) times daily after meals. 02/02/16   Danae Orleans, PA-C   BP 132/80 mmHg  Pulse 77  Temp(Src) 98.5 F (36.9 C) (Oral)  Resp 18  SpO2 99% Physical Exam  Constitutional: He is oriented to person, place, and time. He appears well-developed.  HENT:  Head: Normocephalic.  Eyes: Conjunctivae and EOM are normal. No scleral icterus.  Neck: Neck supple. No thyromegaly present.  Cardiovascular: Normal rate and regular rhythm.  Exam reveals no gallop and no friction rub.    No murmur heard. Pulmonary/Chest: No stridor. He has no wheezes. He has no rales. He exhibits no tenderness.  Abdominal: He exhibits no distension. There is no tenderness. There is no rebound.  Genitourinary:  Right hip is tender.   Right leg is externally rotated and shortened dorsalis pedis pulse 2+  Musculoskeletal: Normal range of motion. He exhibits no edema.  Lymphadenopathy:    He has no cervical adenopathy.  Neurological: He is oriented to person, place, and time. He exhibits normal muscle tone. Coordination normal.  Skin: No rash noted. No erythema.  Psychiatric: He has a normal mood and affect. His behavior is normal.    ED Course  Procedures (including critical care time) Labs Review Labs Reviewed  I-STAT CHEM 8, ED - Abnormal; Notable for the following:    Potassium 3.1 (*)    Chloride 94 (*)    Glucose, Bld 103 (*)    Calcium, Ion 1.07 (*)    Hemoglobin 11.2 (*)    HCT 33.0 (*)    All other components within normal limits    Imaging Review Dg Hip Unilat With Pelvis 2-3 Views Right  02/05/2016  CLINICAL DATA:  55 year old male with acute right hip pain. Patient with total right hip arthroplasty on 02/01/2016. It. EXAM: DG HIP (WITH OR WITHOUT PELVIS) 2-3V RIGHT COMPARISON:  02/01/2016 FINDINGS: Right total hip arthroplasty identified. Superior lateral dislocation of the femoral head component identified. There is no evidence of acute fracture. IMPRESSION: Superior lateral dislocation of the femoral head component of the right hip arthroplasty. Electronically Signed   By: Margarette Canada M.D.   On: 02/05/2016 09:50   I have personally reviewed and evaluated these images and lab results as part of my medical decision-making.   EKG Interpretation None      MDM   Final diagnoses:  None    X-ray shows right hip dislocated. Orthopedic called and will take patient to the operating room to get it fixed    Milton Ferguson, MD 02/05/16 1049

## 2016-02-05 NOTE — Brief Op Note (Signed)
02/05/2016  2:19 PM  PATIENT:  Samuel Goodwin  55 y.o. male  PRE-OPERATIVE DIAGNOSIS: Anterior  dislocated right Total Hip hip  POST-OPERATIVE DIAGNOSIS:Anterior  dislocated right Total Hip hip  PROCEDURE:  Procedure(s): CLOSED MANIPULATION HIP (Right)  SURGEON:  Surgeon(s) and Role:    * Latanya Maudlin, MD - Primary  PHYSICIAN ASSISTANT: Corliss Marcus PA  ASSISTANTS:Jackie Bissel PA   ANESTHESIA:   general  EBL:  Total I/O In: 400 [I.V.:400] Out: -   BLOOD ADMINISTERED:none  DRAINS: none   LOCAL MEDICATIONS USED:  NONE  SPECIMEN:  No Specimen  DISPOSITION OF SPECIMEN:  N/A  COUNTS:  YES  TOURNIQUET:  * No tourniquets in log *  DICTATION: .Other Dictation: Dictation Number [id number]  PLAN OF CARE: Admit to inpatient   PATIENT DISPOSITION:  PACU - hemodynamically stable.   Delay start of Pharmacological VTE agent (>24hrs) due to surgical blood loss or risk of bleeding: yes

## 2016-02-06 MED ORDER — ENOXAPARIN SODIUM 40 MG/0.4ML ~~LOC~~ SOLN
40.0000 mg | SUBCUTANEOUS | Status: AC
  Administered 2016-02-06: 40 mg via SUBCUTANEOUS
  Filled 2016-02-06: qty 0.4

## 2016-02-06 NOTE — Op Note (Signed)
NAMERAZA, FRISTOE NO.:  [id number]  MEDICAL RECORD NO.:  [id number]  LOCATION:  79                         FACILITY:  [facility]  PHYSICIAN:  Kipp Brood. Shiro Ellerman, M.D.DATE OF BIRTH:  [birth year]  DATE OF PROCEDURE:  02/05/2016 DATE OF DISCHARGE:                              OPERATIVE REPORT   SURGEON:  Kipp Brood. Gladstone Lighter, M.D.  ASSISTANT:  Lacie Draft, PA.  PREOPERATIVE DIAGNOSIS:  Closed anterior dislocation of a right total hip that was done about the anterior approach, Dr. Ricard Dillon was his surgeon.  POSTOPERATIVE DIAGNOSIS:  Closed anterior dislocation of a right total hip that was done about the anterior approach, Dr. Ricard Dillon was his surgeon.  OPERATION:  Closed reduction of an anterior dislocation of a right total hip.  DESCRIPTION OF PROCEDURE:  Under general anesthesia, we first did a routine time-out.  We also marked the appropriate right hip in the holding area.  At this time, with the patient under excellent general anesthesia, a closed manipulation of his right total hip was carried out by simply flexing the hip, exerting traction, and then internally rotating the hip.  An x-ray was taken that showed excellent position, that was with the final x-ray.  Several x-rays were taken all the way because even though we felt the hip was in, it looked was shortened there at several times but the final picture we took and we elected after the final picture to put a pillow under his knee keep his hips flexed with a sandbag along lateral aspect to keep internally rotated. I did discuss the case with Dr. Ricard Dillon.  We will admit him because this could strictly be based on muscle weakness.          ______________________________ Kipp Brood Gladstone Lighter, M.D.     RAG/MEDQ  D:  02/05/2016  T:  02/05/2016  Job:  [id number]

## 2016-02-06 NOTE — Progress Notes (Signed)
MD on call returned page and gave verbal order to place foley.

## 2016-02-06 NOTE — Progress Notes (Signed)
   Subjective: 1 Day Post-Op Procedure(s) (LRB): CLOSED MANIPULATION HIP (Right) Patient reports pain as mild and moderate.   Patient seen in rounds with Dr. Wynelle Link. Patient is well, but has had some minor complaints of pain in the hip, requiring pain medications We will keep at bedrest today. Dr. Alvan Dame to see in the morning. Plan will be determined by Dr. Alvan Dame tomorrow.  Objective: Vital signs in last 24 hours: Temp:  [98 F (36.7 C)-99.8 F (37.7 C)] 98 F (36.7 C) (02/26 0514) Pulse Rate:  [72-92] 73 (02/26 0514) Resp:  [12-22] 20 (02/26 0514) BP: (119-157)/(69-100) 124/75 mmHg (02/26 0514) SpO2:  [92 %-100 %] 100 % (02/26 0514) Weight:  [130.636 kg (288 lb)] 130.636 kg (288 lb) (02/25 1500)  Intake/Output from previous day: 02/25 0701 - 02/26 0700 In: 1380 [P.O.:630; I.V.:750] Out: 975 [Urine:975]   Recent Labs  02/05/16 0855  HGB 11.2*    Recent Labs  02/05/16 0855  HCT 33.0*    Recent Labs  02/05/16 0855  NA 137  K 3.1*  CL 94*  BUN 12  CREATININE 0.70  GLUCOSE 103*   No results for input(s): LABPT, INR in the last 72 hours.  EXAM General - Patient is Alert and Appropriate Extremity - Neurovascular intact Sensation intact distally Dressing - dressing C/D/I Motor Function - intact, moving foot and toes well on exam.   Past Medical History  Diagnosis Date  . Hyperlipidemia   . Hypertension   . Hypothyroidism   . GERD (gastroesophageal reflux disease)   . Alcohol abuse     Sobriety since 1990  . Benign positional vertigo   . OSA (obstructive sleep apnea)     on CPAP  . Anxiety   . Depression   . PTSD (post-traumatic stress disorder)   . Arthritis   . Hepatitis C 1994    hep c - ? etiology; in [place] 1989-91; S/P interferon , Dr [name]  . PONV (postoperative nausea and vomiting)     following knee surgery last year   . History of bronchitis   . Eardrum trauma     30 years ago/right ear  . Hoarseness   . Headache   . Vocal cord polyps      Assessment/Plan: 1 Day Post-Op Procedure(s) (LRB): CLOSED MANIPULATION HIP (Right) Active Problems:   Hip instability   H/O total hip arthroplasty  Estimated body mass index is 39.05 kg/(m^2) as calculated from the following:   Height as of this encounter: 6' (1.829 m).   Weight as of this encounter: 130.636 kg (288 lb). Keep at bedrest today Dr. Alvan Dame to see tomorrow. NPO after MN  Arlee Muslim, PA-C Orthopaedic Surgery 02/06/2016, 7:22 AM

## 2016-02-06 NOTE — Progress Notes (Signed)
Patient reports urge to urinate but not able to.  Bladder scanned patient with result of 595 ml.  Left message with nurse from [facility] who notified on call physician.

## 2016-02-07 ENCOUNTER — Inpatient Hospital Stay (HOSPITAL_COMMUNITY): Payer: Managed Care, Other (non HMO)

## 2016-02-07 ENCOUNTER — Inpatient Hospital Stay (HOSPITAL_COMMUNITY): Payer: Managed Care, Other (non HMO) | Admitting: Anesthesiology

## 2016-02-07 ENCOUNTER — Encounter (HOSPITAL_COMMUNITY)
Admission: EM | Disposition: A | Discharge status: Home Health | Payer: Self-pay | Source: Home / Self Care | Attending: Orthopedic Surgery

## 2016-02-07 ENCOUNTER — Encounter (HOSPITAL_COMMUNITY): Payer: Self-pay | Admitting: Orthopedic Surgery

## 2016-02-07 HISTORY — PX: ANTERIOR HIP REVISION: SHX6527

## 2016-02-07 LAB — TYPE AND SCREEN
ABO/RH(D): O POS
Antibody Screen: NEGATIVE

## 2016-02-07 SURGERY — REVISION, TOTAL ARTHROPLASTY, HIP, ANTERIOR APPROACH
Anesthesia: General | Site: Hip | Laterality: Right

## 2016-02-07 MED ORDER — PROPOFOL 10 MG/ML IV BOLUS
INTRAVENOUS | Status: DC | PRN
  Administered 2016-02-07: 120 mg via INTRAVENOUS

## 2016-02-07 MED ORDER — LACTATED RINGERS IV SOLN
INTRAVENOUS | Status: DC | PRN
  Administered 2016-02-07: 18:00:00 via INTRAVENOUS

## 2016-02-07 MED ORDER — STERILE WATER FOR IRRIGATION IR SOLN
Status: DC | PRN
  Administered 2016-02-07: 1000 mL

## 2016-02-07 MED ORDER — LIDOCAINE HCL (CARDIAC) 20 MG/ML IV SOLN
INTRAVENOUS | Status: DC | PRN
  Administered 2016-02-07: 50 mg via INTRAVENOUS

## 2016-02-07 MED ORDER — DEXTROSE 5 % IV SOLN
3.0000 g | Freq: Once | INTRAVENOUS | Status: AC
  Administered 2016-02-07: 3 g via INTRAVENOUS
  Filled 2016-02-07: qty 3000

## 2016-02-07 MED ORDER — LACTATED RINGERS IV SOLN: INTRAVENOUS | Status: DC

## 2016-02-07 MED ORDER — FENTANYL CITRATE (PF) 100 MCG/2ML IJ SOLN
INTRAMUSCULAR | Status: AC
  Filled 2016-02-07: qty 2

## 2016-02-07 MED ORDER — POTASSIUM CHLORIDE CRYS ER 20 MEQ PO TBCR
40.0000 meq | EXTENDED_RELEASE_TABLET | Freq: Once | ORAL | Status: AC
  Administered 2016-02-07: 40 meq via ORAL
  Filled 2016-02-07: qty 2

## 2016-02-07 MED ORDER — CEFAZOLIN SODIUM-DEXTROSE 2-3 GM-% IV SOLR
INTRAVENOUS | Status: AC
  Filled 2016-02-07: qty 50

## 2016-02-07 MED ORDER — ROCURONIUM BROMIDE 100 MG/10ML IV SOLN
INTRAVENOUS | Status: AC
  Filled 2016-02-07: qty 1

## 2016-02-07 MED ORDER — ONDANSETRON HCL 4 MG/2ML IJ SOLN
INTRAMUSCULAR | Status: AC
  Filled 2016-02-07: qty 2

## 2016-02-07 MED ORDER — MIDAZOLAM HCL 5 MG/5ML IJ SOLN
INTRAMUSCULAR | Status: DC | PRN
  Administered 2016-02-07 (×2): 1 mg via INTRAVENOUS

## 2016-02-07 MED ORDER — FENTANYL CITRATE (PF) 250 MCG/5ML IJ SOLN
INTRAMUSCULAR | Status: AC
  Filled 2016-02-07: qty 5

## 2016-02-07 MED ORDER — FENTANYL CITRATE (PF) 100 MCG/2ML IJ SOLN: 50.0000 ug | INTRAMUSCULAR | Status: DC | PRN

## 2016-02-07 MED ORDER — LACTATED RINGERS IV SOLN
INTRAVENOUS | Status: DC | PRN
  Administered 2016-02-07: 19:00:00 via INTRAVENOUS

## 2016-02-07 MED ORDER — LABETALOL HCL 5 MG/ML IV SOLN
INTRAVENOUS | Status: DC | PRN
  Administered 2016-02-07 (×2): 5 mg via INTRAVENOUS

## 2016-02-07 MED ORDER — FENTANYL CITRATE (PF) 100 MCG/2ML IJ SOLN
INTRAMUSCULAR | Status: DC | PRN
  Administered 2016-02-07 (×2): 50 ug via INTRAVENOUS
  Administered 2016-02-07: 100 ug via INTRAVENOUS
  Administered 2016-02-07: 50 ug via INTRAVENOUS
  Administered 2016-02-07 (×2): 100 ug via INTRAVENOUS
  Administered 2016-02-07: 50 ug via INTRAVENOUS

## 2016-02-07 MED ORDER — PROPOFOL 10 MG/ML IV BOLUS
INTRAVENOUS | Status: AC
  Filled 2016-02-07: qty 20

## 2016-02-07 MED ORDER — METHOCARBAMOL 1000 MG/10ML IJ SOLN
500.0000 mg | Freq: Three times a day (TID) | INTRAVENOUS | Status: DC
  Filled 2016-02-07 (×5): qty 5

## 2016-02-07 MED ORDER — ACETAMINOPHEN 10 MG/ML IV SOLN
INTRAVENOUS | Status: AC
  Filled 2016-02-07: qty 100

## 2016-02-07 MED ORDER — LABETALOL HCL 5 MG/ML IV SOLN
INTRAVENOUS | Status: AC
  Filled 2016-02-07: qty 4

## 2016-02-07 MED ORDER — 0.9 % SODIUM CHLORIDE (POUR BTL) OPTIME
TOPICAL | Status: DC | PRN
  Administered 2016-02-07: 1000 mL

## 2016-02-07 MED ORDER — MIDAZOLAM HCL 2 MG/2ML IJ SOLN
INTRAMUSCULAR | Status: AC
  Filled 2016-02-07: qty 2

## 2016-02-07 MED ORDER — FENTANYL CITRATE (PF) 100 MCG/2ML IJ SOLN
25.0000 ug | INTRAMUSCULAR | Status: DC | PRN
  Administered 2016-02-07: 25 ug via INTRAVENOUS

## 2016-02-07 MED ORDER — LIDOCAINE HCL (CARDIAC) 20 MG/ML IV SOLN
INTRAVENOUS | Status: AC
  Filled 2016-02-07: qty 5

## 2016-02-07 MED ORDER — ROCURONIUM BROMIDE 100 MG/10ML IV SOLN
INTRAVENOUS | Status: DC | PRN
  Administered 2016-02-07: 40 mg via INTRAVENOUS
  Administered 2016-02-07 (×2): 20 mg via INTRAVENOUS

## 2016-02-07 MED ORDER — SUGAMMADEX SODIUM 500 MG/5ML IV SOLN
INTRAVENOUS | Status: DC | PRN
  Administered 2016-02-07: 300 mg via INTRAVENOUS

## 2016-02-07 MED ORDER — FENTANYL CITRATE (PF) 250 MCG/5ML IJ SOLN
INTRAMUSCULAR | Status: AC
Start: 2016-02-07 — End: 2016-02-07
  Filled 2016-02-07: qty 5

## 2016-02-07 MED ORDER — SUCCINYLCHOLINE CHLORIDE 20 MG/ML IJ SOLN
INTRAMUSCULAR | Status: DC | PRN
  Administered 2016-02-07: 100 mg via INTRAVENOUS

## 2016-02-07 MED ORDER — SUGAMMADEX SODIUM 500 MG/5ML IV SOLN
INTRAVENOUS | Status: AC
  Filled 2016-02-07: qty 5

## 2016-02-07 MED ORDER — TRANEXAMIC ACID 1000 MG/10ML IV SOLN
1000.0000 mg | INTRAVENOUS | Status: AC
  Administered 2016-02-07: 1000 mg via INTRAVENOUS
  Filled 2016-02-07: qty 10

## 2016-02-07 MED ORDER — ONDANSETRON HCL 4 MG/2ML IJ SOLN
INTRAMUSCULAR | Status: DC | PRN
  Administered 2016-02-07: 4 mg via INTRAVENOUS

## 2016-02-07 MED ORDER — ACETAMINOPHEN 10 MG/ML IV SOLN
INTRAVENOUS | Status: DC | PRN
  Administered 2016-02-07: 1000 mg via INTRAVENOUS

## 2016-02-07 SURGICAL SUPPLY — 34 items
BAG SPEC THK2 15X12 ZIP CLS (MISCELLANEOUS)
BAG ZIPLOCK 12X15 (MISCELLANEOUS) IMPLANT
CLOTH BEACON ORANGE TIMEOUT ST (SAFETY) ×2 IMPLANT
COVER PERINEAL POST (MISCELLANEOUS) ×2 IMPLANT
DRAPE STERI IOBAN 125X83 (DRAPES) ×2 IMPLANT
DRAPE U-SHAPE 47X51 STRL (DRAPES) ×4 IMPLANT
DRSG AQUACEL AG ADV 3.5X10 (GAUZE/BANDAGES/DRESSINGS) ×2 IMPLANT
DURAPREP 26ML APPLICATOR (WOUND CARE) ×2 IMPLANT
ELECT REM PT RETURN 15FT ADLT (MISCELLANEOUS) IMPLANT
ELECT REM PT RETURN 9FT ADLT (ELECTROSURGICAL) ×2
ELECTRODE REM PT RTRN 9FT ADLT (ELECTROSURGICAL) ×1 IMPLANT
GLOVE BIOGEL M 7.0 STRL (GLOVE) IMPLANT
GLOVE BIOGEL PI IND STRL 7.5 (GLOVE) ×1 IMPLANT
GLOVE BIOGEL PI IND STRL 8.5 (GLOVE) ×1 IMPLANT
GLOVE BIOGEL PI INDICATOR 7.5 (GLOVE) ×1
GLOVE BIOGEL PI INDICATOR 8.5 (GLOVE) ×1
GLOVE ECLIPSE 8.0 STRL XLNG CF (GLOVE) ×2 IMPLANT
GLOVE ORTHO TXT STRL SZ7.5 (GLOVE) ×4 IMPLANT
GOWN STRL REUS W/TWL LRG LVL3 (GOWN DISPOSABLE) ×2 IMPLANT
GOWN STRL REUS W/TWL XL LVL3 (GOWN DISPOSABLE) ×2 IMPLANT
HEAD CERAMIC 36 PLUS 8.5 12 14 (Hips) ×1 IMPLANT
HOLDER FOLEY CATH W/STRAP (MISCELLANEOUS) ×1 IMPLANT
LIQUID BAND (GAUZE/BANDAGES/DRESSINGS) ×2 IMPLANT
PACK ANTERIOR HIP CUSTOM (KITS) ×2 IMPLANT
SAW OSC TIP CART 19.5X105X1.3 (SAW) ×2 IMPLANT
SUT MNCRL AB 3-0 PS2 18 (SUTURE) ×1 IMPLANT
SUT MNCRL AB 4-0 PS2 18 (SUTURE) ×2 IMPLANT
SUT VIC AB 1 CT1 36 (SUTURE) ×6 IMPLANT
SUT VIC AB 2-0 CT1 27 (SUTURE) ×4
SUT VIC AB 2-0 CT1 TAPERPNT 27 (SUTURE) ×2 IMPLANT
SUT VLOC 180 0 24IN GS25 (SUTURE) ×2 IMPLANT
TRAY FOLEY W/METER SILVER 14FR (SET/KITS/TRAYS/PACK) ×1 IMPLANT
TRAY FOLEY W/METER SILVER 16FR (SET/KITS/TRAYS/PACK) ×1 IMPLANT
WATER STERILE IRR 1500ML POUR (IV SOLUTION) ×2 IMPLANT

## 2016-02-07 NOTE — Progress Notes (Signed)
Patient ID: Samuel Goodwin, male   DOB: [birth year], 55 y.o.   MRN: [id number]  Admission reviewed with Dr. Gladstone Lighter Agreed with admission  He reports to have been doing great his first few days at home without events or concerns  Had discussion with patient and his wife about options We all concluded that it was in his best interest to proceed with an open exploration of the hip to identify a source of instability  Will have available equipment to revise the acetabular component plus minus the femoral component At this point not concerned about fracture as etiology  Consent filled out NPO

## 2016-02-07 NOTE — Anesthesia Preprocedure Evaluation (Addendum)
Anesthesia Evaluation  Patient identified by MRN, date of birth, ID band Patient awake    Reviewed: Allergy & Precautions, H&P , NPO status , Patient's Chart, lab work & pertinent test results, reviewed documented beta blocker date and time   History of Anesthesia Complications (+) PONV  Airway Mallampati: II  TM Distance: >3 FB Neck ROM: full    Dental no notable dental hx. (+) Dental Advisory Given, Caps Entire upper front are capped:   Pulmonary sleep apnea ,    Pulmonary exam normal breath sounds clear to auscultation       Cardiovascular Exercise Tolerance: Good hypertension, Pt. on medications negative cardio ROS Normal cardiovascular exam Rhythm:regular Rate:Normal     Neuro/Psych negative neurological ROS  negative psych ROS   GI/Hepatic negative GI ROS, GERD  Medicated and Controlled,(+) Hepatitis -, C  Endo/Other  negative endocrine ROSHypothyroidism Morbid obesity  Renal/GU negative Renal ROS  negative genitourinary   Musculoskeletal   Abdominal   Peds  Hematology negative hematology ROS (+)   Anesthesia Other Findings   Reproductive/Obstetrics negative OB ROS                            Anesthesia Physical Anesthesia Plan  ASA: III  Anesthesia Plan: General   Post-op Pain Management:    Induction: Intravenous  Airway Management Planned: Oral ETT  Additional Equipment:   Intra-op Plan:   Post-operative Plan: Extubation in OR  Informed Consent: I have reviewed the patients History and Physical, chart, labs and discussed the procedure including the risks, benefits and alternatives for the proposed anesthesia with the patient or authorized representative who has indicated his/her understanding and acceptance.   Dental Advisory Given  Plan Discussed with: CRNA and Surgeon  Anesthesia Plan Comments:        Anesthesia Quick Evaluation

## 2016-02-07 NOTE — Discharge Instructions (Signed)

## 2016-02-07 NOTE — Op Note (Signed)
Samuel Goodwin, Samuel Goodwin NO.:  [id number]  MEDICAL RECORD NO.:  [id number]  LOCATION:  [id number]                         FACILITY:  [facility]  PHYSICIAN:  Pietro Cassis. Alvan Dame, M.D.  DATE OF BIRTH:  Sep 10, 1961  DATE OF PROCEDURE:  02/07/2016 DATE OF DISCHARGE:                              OPERATIVE REPORT   PREOPERATIVE DIAGNOSIS:  Status post right total hip arthroplasty with early dislocation and concerns for instability.  POSTOPERATIVE DIAGNOSIS:  Status post right total hip arthroplasty with early dislocation and concerns for instability.  PROCEDURE:  Revision, right hip.  COMPONENTS USED:  A 36+ 8.5 Delta ceramic ball.  SURGEON:  Pietro Cassis. Alvan Dame, M.D.  ASSISTANT:  Danae Orleans, PA-C.  Note that, Mr. Samuel Goodwin was present for the entirety of the case from preoperative position, perioperative management of the operative extremity, general facilitation of the case and primary wound closure.  ANESTHESIA:  General.  SPECIMENS:  None.  COMPLICATION:  None.  BLOOD LOSS:  500 mL.  INDICATIONS FOR PROCEDURE:  Samuel Goodwin is a very pleasant 55 year old male, who recently underwent right total hip arthroplasty by myself last week on February 01, 2016.  He had successfully made it through the operation, which appeared to be uncomplicated without any issues.  He was in the hospital overnight, went home the next day.  He reports feeling at home for the first 3 days as if things were going significantly better form overall.  He had no significant back pain, no hip pain.  Then, he went to sleep on Friday night and awoke at some point and unable to move his leg.  He cannot recall any movement or activity that would have caused the event.  He was brought to the emergency room and radiographs revealed a dislocated right hip.  He was subsequently taken to the operating room by my partner, Dr. Gladstone Lighter. Dr. Gladstone Lighter was able to reduce the hip, but to contact me with raising concerns  about the fact that with limited external rotation, the hip seemed to want to come out.  I subsequently recommended that he admit him to the hospital for immediate address in an open approach to evaluate and what could have been a source of this ease of subluxation and was not identified in immediate postoperative.  Given the concerns that were raised, I had a discussion with Samuel Goodwin and his wife regarding his course.  I felt that I needed to be take him back to the operating room to identify if there was a source of impingement or entrapment within the femoral head-acetabular junction.  We agreed upon to proceed in this fashion.  Risks of infection discussed, risk of recurrent dislocation and risk of need for future surgery all reviewed.  Consent was obtained for improved stability of the hip.  PROCEDURE IN DETAIL:  The patient was brought to the operative theater. Once adequate anesthesia, preoperative antibiotics, Ancef 3 g administered, he was positioned supine on the OSI Hana table.  There were no adverse events on these transfers.  He had his belly draped and taped over to allow for exposure.  The right hip was then prescrubbed and then prepped and draped  in sterile fashion with the previous incision demarcated.  Time-out was performed identifying the patient, planned procedure, and extremity.  At this point, I elected to go ahead and open up his hip.  His hip incision was incised, seroma tissue identified, no other complicating features.  The fascial layer was intact and then was re- incised sharply.  At this point, we evacuated further any anterior hematoma.  Once I had the hip exposed, I did see the retained capsular tissue for my capsulotomy present, I did not see any obvious soft tissues entrapped from the femoral head-acetabular junction.  I then had our circulating nurse internally rotated the hip to 40 degrees with stability and tightness.  I then had her externally  rotate to 40 degrees without any evidence of subluxation, even to 60 degrees, there started to be a little bit of subluxation.  At this point, I dislocated the femoral head, I removed the femoral head.  With some of my preoperative planning and mental processing was to consider whether or not I had to remove the femoral component to evaluate the acetabular component.  At this point, based on the initial findings, there was no evidence of obvious easy instability, I elected to go ahead and removed the posterior capsular soft tissues with the idea that perhaps this was causing a lavaging point.  At this point, this posterior soft tissue and remaining capsular tissues were excised that was visible.  Once this was done, I went through series of trial reductions, first with a 36+ 5 ball and then a 36+ 8.5 ball.  I found that with the 36+ 5 ball that at 60 degrees of external rotation, there was a little bit of subluxation of the joint.  With 8.5 ball, there was no subluxation.  I could not feel or palpate any areas of impingement even of the posterior trochanter externally or around the acetabular rim related any soft tissues.  This did not make a tremendous amount of sense.  I evaluated the cup position.  Initially one of the radiographic evaluations, cross-table lateral, I was worried that I did not have enough anteversion in the cup that would have provided protection for any anterior subluxation, however, and the direct visualization based on the anterior rim of the acetabulum that the hip had appropriate amount of anteversion and abduction.  For this reason, I elected not to remove the acetabular liner or his acetabular shell.  I did come to the conclusion that perhaps related to comparing this to his contralateral hip that his native offset may not have been perfectly matched, I thus elected to use the 8.5 ball to help with offset as well as soft tissue tension.  Based on this, the 36+  8.5 ball was selected and impacted on the clean and dried trunnion, the hip was reduced.  I radiographically assessing his hip, I do feel that we probably have added a few millimeters of length to this, but with lessened offset, he may not recognize this at all.  For combined stability purpose of the procedure, I felt this is unacceptable.  Once that was completed, the wound was irrigated with normal saline solution copiously.  I then reapproximated the fascia, the tensor fascia lata using #1 Vicryl and 0 V-Loc suture.  The remaining wound was closed in layers with 2- 0 Vicryl and running 4-0 Monocryl.  The hip was then cleaned, dried and dressed sterilely with a new Aquacel dressing.  He was then brought to the recovery room  in stable condition tolerating the procedure well. Findings will be reviewed with the family and the patient in the morning.     Pietro Cassis Alvan Dame, M.D.     MDO/MEDQ  D:  02/07/2016  T:  02/07/2016  Job:  [id number]

## 2016-02-07 NOTE — Anesthesia Procedure Notes (Signed)
Procedure Name: Intubation Date/Time: 02/07/2016 6:21 PM Performed by: Deliah Boston Pre-anesthesia Checklist: Patient identified, Emergency Drugs available, Suction available and Patient being monitored Patient Re-evaluated:Patient Re-evaluated prior to inductionOxygen Delivery Method: Circle System Utilized Preoxygenation: Pre-oxygenation with 100% oxygen Intubation Type: IV induction Ventilation: Mask ventilation without difficulty Laryngoscope Size: Glidescope and 4 Grade View: Grade I Tube type: Oral Tube size: 7.5 mm Number of attempts: 1 Airway Equipment and Method: Oral airway,  Video-laryngoscopy and Rigid stylet Placement Confirmation: ETT inserted through vocal cords under direct vision,  positive ETCO2 and breath sounds checked- equal and bilateral Secured at: 21 cm Tube secured with: Tape Dental Injury: Teeth and Oropharynx as per pre-operative assessment

## 2016-02-07 NOTE — Transfer of Care (Signed)
Immediate Anesthesia Transfer of Care Note  Patient: Samuel Goodwin  Procedure(s) Performed: Procedure(s): ANTERIOR HIP REVISION (Right)  Patient Location: PACU  Anesthesia Type:General  Level of Consciousness: awake, alert  and oriented  Airway & Oxygen Therapy: Patient Spontanous Breathing and Patient connected to face mask oxygen  Post-op Assessment: Report given to RN and Post -op Vital signs reviewed and stable  Post vital signs: Reviewed and stable  Last Vitals:  Filed Vitals:   02/07/16 0602 02/07/16 1454  BP: 116/81 125/83  Pulse: 79 70  Temp: 36.6 C 36.9 C  Resp: 18 19    Complications: No apparent anesthesia complications

## 2016-02-07 NOTE — Brief Op Note (Signed)
02/05/2016 - 02/07/2016  7:50 PM  PATIENT:  Samuel Goodwin  55 y.o. 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   Delay start of Pharmacological VTE agent (>24hrs) due to surgical blood loss or risk of bleeding: no

## 2016-02-08 ENCOUNTER — Encounter (HOSPITAL_COMMUNITY): Payer: Self-pay | Admitting: Orthopedic Surgery

## 2016-02-08 LAB — CBC
HEMATOCRIT: 32.5 % — AB (ref 39.0–52.0)
HEMOGLOBIN: 10.8 g/dL — AB (ref 13.0–17.0)
MCH: 29.7 pg (ref 26.0–34.0)
MCHC: 33.2 g/dL (ref 30.0–36.0)
MCV: 89.3 fL (ref 78.0–100.0)
Platelets: 225 10*3/uL (ref 150–400)
RBC: 3.64 MIL/uL — ABNORMAL LOW (ref 4.22–5.81)
RDW: 13.6 % (ref 11.5–15.5)
WBC: 7.6 10*3/uL (ref 4.0–10.5)

## 2016-02-08 LAB — BASIC METABOLIC PANEL
Anion gap: 8 (ref 5–15)
BUN: 9 mg/dL (ref 6–20)
CHLORIDE: 103 mmol/L (ref 101–111)
CO2: 28 mmol/L (ref 22–32)
CREATININE: 0.75 mg/dL (ref 0.61–1.24)
Calcium: 8.2 mg/dL — ABNORMAL LOW (ref 8.9–10.3)
GFR calc non Af Amer: >60 mL/min (ref >60)
GLUCOSE: 111 mg/dL — AB (ref 65–99)
Potassium: 4.2 mmol/L (ref 3.5–5.1)
Sodium: 139 mmol/L (ref 135–145)

## 2016-02-08 MED ORDER — OMEPRAZOLE 20 MG PO CPDR
20.0000 mg | DELAYED_RELEASE_CAPSULE | Freq: Every day | ORAL | Status: DC
  Administered 2016-02-09: 20 mg via ORAL
  Filled 2016-02-08 (×2): qty 1

## 2016-02-08 MED ORDER — ASPIRIN EC 325 MG PO TBEC
325.0000 mg | DELAYED_RELEASE_TABLET | Freq: Two times a day (BID) | ORAL | Status: DC
  Administered 2016-02-08 – 2016-02-09 (×3): 325 mg via ORAL
  Filled 2016-02-08 (×5): qty 1

## 2016-02-08 MED ORDER — HYDROCODONE-ACETAMINOPHEN 7.5-325 MG PO TABS
1.0000 | ORAL_TABLET | ORAL | Status: DC
  Administered 2016-02-08 – 2016-02-09 (×8): 2 via ORAL
  Filled 2016-02-08 (×2): qty 2
  Filled 2016-02-08: qty 1
  Filled 2016-02-08 (×4): qty 2
  Filled 2016-02-08: qty 1
  Filled 2016-02-08: qty 2

## 2016-02-08 MED ORDER — MAGNESIUM HYDROXIDE 400 MG/5ML PO SUSP
30.0000 mL | Freq: Every day | ORAL | Status: DC | PRN
  Administered 2016-02-08: 30 mL via ORAL
  Filled 2016-02-08: qty 30

## 2016-02-08 MED ORDER — MORPHINE SULFATE (PF) 2 MG/ML IV SOLN
1.0000 mg | INTRAVENOUS | Status: DC | PRN
  Administered 2016-02-08: 2 mg via INTRAVENOUS
  Filled 2016-02-08: qty 1

## 2016-02-08 NOTE — Progress Notes (Signed)
Physical Therapy Treatment Patient Details Name: Samuel Goodwin MRN: [id number] DOB: 1961-03-29 Today's Date: 02/08/2016    History of Present Illness dislocated  R DATHA 2/25. Was reduced. Revision of  femoral 2/27.    PT Comments    Progressing slowly with mobility but motivated.  States that after initial THR he felt better than he had in years and wants to get back to that.  Follow Up Recommendations  Home health PT;Supervision/Assistance - 24 hour     Equipment Recommendations  None recommended by PT    Recommendations for Other Services OT consult     Precautions / Restrictions Precautions Precautions: Fall Restrictions Weight Bearing Restrictions: No RLE Weight Bearing: Weight bearing as tolerated    Mobility  Bed Mobility Overal bed mobility: Needs Assistance Bed Mobility: Sit to Supine       Sit to supine: Min assist   General bed mobility comments: Cues for sequence.  Utilization of leg lifter to self assist  Transfers Overall transfer level: Needs assistance Equipment used: Rolling walker (2 wheeled) Transfers: Sit to/from Stand Sit to Stand: Min assist;Mod assist         General transfer comment: cues for transition position and use of UEs.  Increased assist to stand 2* ltd knee flex on non-operative LE  Ambulation/Gait Ambulation/Gait assistance: Min assist Ambulation Distance (Feet): 52 Feet Assistive device: Rolling walker (2 wheeled) Gait Pattern/deviations: Step-to pattern;Decreased step length - right;Decreased step length - left;Shuffle;Trunk flexed Gait velocity: decr Gait velocity interpretation: Below normal speed for age/gender General Gait Details: cues for posture, position from RW, initial difficulty to advance the R leg first   Stairs            Wheelchair Mobility    Modified Rankin (Stroke Patients Only)       Balance                                    Cognition Arousal/Alertness:  Awake/alert Behavior During Therapy: WFL for tasks assessed/performed Overall Cognitive Status: Within Functional Limits for tasks assessed                      Exercises      General Comments        Pertinent Vitals/Pain Pain Assessment: 0-10 Pain Score: 4  Pain Location: R hip Pain Descriptors / Indicators: Aching;Sore;Tender;Tightness Pain Intervention(s): Limited activity within patient's tolerance;Monitored during session;Premedicated before session;Ice applied    Home Living                      Prior Function            PT Goals (current goals can now be found in the care plan section) Acute Rehab PT Goals Patient Stated Goal: to walk, get this  leg back strong PT Goal Formulation: With patient Time For Goal Achievement: 02/15/16 Potential to Achieve Goals: Good Progress towards PT goals: Progressing toward goals    Frequency  7X/week    PT Plan Current plan remains appropriate    Co-evaluation             End of Session Equipment Utilized During Treatment: Gait belt Activity Tolerance: Patient tolerated treatment well Patient left: in bed;with call bell/phone within reach;with family/visitor present     Time: 1530-1555 PT Time Calculation (min) (ACUTE ONLY): 25 min  Charges:  $Gait Training: 23-37 mins  G Codes:      Samuel Goodwin March 07, 2016, 6:12 PM

## 2016-02-08 NOTE — Anesthesia Postprocedure Evaluation (Signed)
Anesthesia Post Note  Patient: Samuel Goodwin  Procedure(s) Performed: Procedure(s) (LRB): ANTERIOR HIP REVISION (Right)  Patient location during evaluation: PACU Anesthesia Type: General Level of consciousness: awake and alert Pain management: pain level controlled Vital Signs Assessment: post-procedure vital signs reviewed and stable Respiratory status: spontaneous breathing, nonlabored ventilation, respiratory function stable and patient connected to nasal cannula oxygen Cardiovascular status: blood pressure returned to baseline and stable Postop Assessment: no signs of nausea or vomiting Anesthetic complications: no    Last Vitals:  Filed Vitals:   02/08/16 0938 02/08/16 1300  BP: 125/81 127/75  Pulse: 87 102  Temp: 36.7 C 37 C  Resp: 18 18    Last Pain:  Filed Vitals:   02/08/16 1710  PainSc: 2                  Leverne Amrhein L

## 2016-02-08 NOTE — Progress Notes (Signed)
Patient ID: Samuel Goodwin, male   DOB: 1961-11-13, 55 y.o.   MRN: [id number] Subjective: 1 Day Post-Op Procedure(s) (LRB): ANTERIOR HIP REVISION (Right)    Patient reports pain as mild to moderate. Pain better controlled as night progressed.  No events  Objective:   VITALS:   Filed Vitals:   02/08/16 0034 02/08/16 0458  BP: 135/84 116/78  Pulse: 67 86  Temp: 98.6 F (37 C) 98.5 F (36.9 C)  Resp: 14 14    Neurovascular intact Incision: dressing C/D/I  LABS  Recent Labs  02/08/16 0743  HGB 10.8*  HCT 32.5*  WBC 7.6  PLT 225     Recent Labs  02/08/16 0743  NA 139  K 4.2  BUN 9  CREATININE 0.75  GLUCOSE 111*    No results for input(s): LABPT, INR in the last 72 hours.   Assessment/Plan: 1 Day Post-Op Procedure(s) (LRB): ANTERIOR HIP REVISION (Right)   Advance diet Up with therapy Plan for discharge tomorrow unless he does very well today  I reviewed intra-operative findings and procedure performed Encouraged exercises in bed (heel slides) to try and regain confidence in function

## 2016-02-08 NOTE — Evaluation (Signed)
Physical Therapy Evaluation Patient Details Name: Samuel Goodwin MRN: [id number] DOB: [birth year] Today's Date: 02/08/2016   History of Present Illness  S/P R DATHA on 02/01/16 which dislocated  02/05/16 while in bed. The  Hip  Was reduced then required a  Revision of  Femoral head on 2/27.  Clinical Impression  Mr. Wintle is very pleasant and motivated and relieved to be OOB. His  Mobility is limited by the tightness of the Right hip, decreased ability to advance the Rt. Leg but which improved with more distance. The patient will benefit from PT to address  The problems listed in the note below to return home.     Follow Up Recommendations Home health PT;Supervision/Assistance - 24 hour    Equipment Recommendations  None recommended by PT    Recommendations for Other Services OT consult     Precautions / Restrictions Precautions Precautions: Fall Restrictions RLE Weight Bearing: Weight bearing as tolerated      Mobility  Bed Mobility Overal bed mobility: Needs Assistance Bed Mobility: Supine to Sit     Supine to sit: Mod assist;HOB elevated;Max assist     General bed mobility comments: max assist for moving the Rlt leg to R edge of bed, the patient used  a sheet, used the bed rail and trapeze as  Right hip flexion limited. Bed raised once in sitting due too tightness.  Transfers Overall transfer level: Needs assistance Equipment used: Rolling walker (2 wheeled) Transfers: Sit to/from Stand Sit to Stand: Min assist         General transfer comment: cues for LE management and use of UEs to self assist, bed raised, cues for R leg position , min assist to  place R leg forward prior to sitting down into recliner.  Ambulation/Gait Ambulation/Gait assistance: Min assist Ambulation Distance (Feet): 25 Feet Assistive device: Rolling walker (2 wheeled) Gait Pattern/deviations: Step-to pattern;Antalgic Gait velocity: decr   General Gait Details: cues for posture, position  from RW, much difficulty to advance the R leg first, improved gait  with L step first.  Stairs            Wheelchair Mobility    Modified Rankin (Stroke Patients Only)       Balance Overall balance assessment: Needs assistance         Standing balance support: During functional activity;Bilateral upper extremity supported Standing balance-Leahy Scale: Fair Standing balance comment: mildly leans posteriorly, encouraged RW out front a little more. Did stand and  prop to urinate  at toilet w/ min guard.                             Pertinent Vitals/Pain Pain Score: 4  Pain Location: R hip Pain Descriptors / Indicators: Tender;Tightness Pain Intervention(s): Limited activity within patient's tolerance;Monitored during session;Premedicated before session;Repositioned;Ice applied    Home Living Family/patient expects to be discharged to:: Private residence Living Arrangements: Spouse/significant other Available Help at Discharge: Family   Home Access: Stairs to enter   Technical brewer of Steps: 1 Home Layout: Able to live on main level with bedroom/bathroom Home Equipment: Walker - 2 wheels;Cane - single point;Bedside commode;Shower seat - built in      Prior Function Level of Independence: Needs assistance   Gait / Transfers Assistance Needed: amb w/ RW PTA           Hand Dominance        Extremity/Trunk Assessment   Upper Extremity  Assessment: Overall WFL for tasks assessed             RLE Deficits / Details: requires assist to slide leg to edge of bed, much effort to advance the leg first,  LLE Deficits / Details: Strength WFL with knee flex ltd to ~ 80  Cervical / Trunk Assessment: Normal  Communication      Cognition Arousal/Alertness: Awake/alert Behavior During Therapy: WFL for tasks assessed/performed Overall Cognitive Status: Within Functional Limits for tasks assessed                      General Comments       Exercises        Assessment/Plan    PT Assessment Patient needs continued PT services  PT Diagnosis Difficulty walking;Generalized weakness;Acute pain   PT Problem List Decreased strength;Decreased range of motion;Decreased activity tolerance;Decreased balance;Decreased mobility;Pain  PT Treatment Interventions DME instruction;Gait training;Stair training;Functional mobility training;Therapeutic activities;Therapeutic exercise;Patient/family education   PT Goals (Current goals can be found in the Care Plan section) Acute Rehab PT Goals Patient Stated Goal: to walk, get this  leg back strong PT Goal Formulation: With patient Time For Goal Achievement: 02/15/16 Potential to Achieve Goals: Good    Frequency 7X/week   Barriers to discharge        Co-evaluation               End of Session Equipment Utilized During Treatment: Gait belt Activity Tolerance: Patient tolerated treatment well Patient left: in chair Nurse Communication: Mobility status         Time: 1230-1305 PT Time Calculation (min) (ACUTE ONLY): 35 min   Charges:   PT Evaluation $PT Eval Low Complexity: 1 Procedure PT Treatments $Gait Training: 8-22 mins   PT G Codes:        Claretha Cooper 02/08/2016, 1:57 PM Tresa Endo PT [phone]

## 2016-02-09 DIAGNOSIS — Z96649 Presence of unspecified artificial hip joint: Secondary | ICD-10-CM

## 2016-02-09 HISTORY — DX: Presence of unspecified artificial hip joint: Z96.649

## 2016-02-09 MED ORDER — METHOCARBAMOL 500 MG PO TABS: 500.0000 mg | ORAL_TABLET | Freq: Four times a day (QID) | ORAL | Status: DC | PRN

## 2016-02-09 MED ORDER — HYDROCODONE-ACETAMINOPHEN 7.5-325 MG PO TABS: 1.0000 | ORAL_TABLET | ORAL | Status: DC | PRN

## 2016-02-09 MED ORDER — ASPIRIN 325 MG PO TBEC: 325.0000 mg | DELAYED_RELEASE_TABLET | Freq: Two times a day (BID) | ORAL | Status: AC

## 2016-02-09 NOTE — Progress Notes (Signed)
     Subjective: 2 Days Post-Op Procedure(s) (LRB): ANTERIOR HIP REVISION (Right)   Patient reports pain as mild, pain controlled. No events throughout the night. Feeling more comfortable with his hip again.  Able to flex the hip, which he states is more than he could do yesterday.  Objective:   VITALS:   Filed Vitals:   02/09/16 0153 02/09/16 0546  BP: 120/76 125/87  Pulse: 87 82  Temp: 98.4 F (36.9 C) 98.1 F (36.7 C)  Resp: 16 16    Dorsiflexion/Plantar flexion intact Incision: dressing C/D/I No cellulitis present Compartment soft  LABS  Recent Labs  02/08/16 0743  HGB 10.8*  HCT 32.5*  WBC 7.6  PLT 225     Recent Labs  02/08/16 0743  NA 139  K 4.2  BUN 9  CREATININE 0.75  GLUCOSE 111*     Assessment/Plan: 2 Days Post-Op Procedure(s) (LRB): ANTERIOR HIP REVISION (Right) Up with therapy Discharge home with home health  Follow up in 2 weeks at [facility]. Follow up with OLIN,Eufemia Prindle D in 2 weeks.  Contact information:  [facility] [address], [place] [id number]    Obese (BMI 30-39.9) Estimated body mass index is 39.05 kg/(m^2) as calculated from the following:   Height as of this encounter: 6' (1.829 m).   Weight as of this encounter: 130.636 kg (288 lb). Patient also counseled that weight may inhibit the healing process Patient counseled that losing weight will help with future health issues         [place]. Elfreda Blanchet   PAC  02/09/2016, 8:33 AM

## 2016-02-09 NOTE — Care Management Note (Signed)
Case Management Note  Patient Details  Name: Samuel Goodwin MRN: [id number] Date of Birth: 1961/06/25  Subjective/Objective:                  ANTERIOR HIP REVISION (Right)  Action/Plan: Discharge planning Expected Discharge Date:  02/08/16               Expected Discharge Plan:  [place]  In-House Referral:     Discharge planning Services  CM Consult  Post Acute Care Choice:  Resumption of Svcs/PTA Provider Choice offered to:  Patient  DME Arranged:    DME Agency:     HH Arranged:  PT HH Agency:  [facility]  Status of Service:  Completed, signed off  Medicare Important Message Given:    Date Medicare IM Given:    Medicare IM give by:    Date Additional Medicare IM Given:    Additional Medicare Important Message give by:     If discussed at [place] of Stay Meetings, dates discussed:    Additional Comments: CM met with pt to confirm resumption of HHPT with [facility].  CM called and faxed facesheet, orders, last progress note, and H&P to [facility].  No other CM needs were communicated. Dellie Catholic, RN 02/09/2016, 11:23 AM

## 2016-02-09 NOTE — Progress Notes (Signed)
Physical Therapy Treatment Patient Details Name: Samuel Goodwin MRN: [id number] DOB: 10/09/1961 Today's Date: 02/09/2016    History of Present Illness dislocated  R DATHA 2/25. Was reduced. Revision of  femoral 2/27.    PT Comments    Pt progressing well with marked improvement in activity tolerance and confidence level.  Reviewed therex, stairs and car transfers.  Follow Up Recommendations  Home health PT;Supervision/Assistance - 24 hour     Equipment Recommendations  None recommended by PT    Recommendations for Other Services OT consult     Precautions / Restrictions Precautions Precautions: Fall Restrictions Weight Bearing Restrictions: No RLE Weight Bearing: Weight bearing as tolerated    Mobility  Bed Mobility Overal bed mobility: Needs Assistance Bed Mobility: Supine to Sit     Supine to sit: Min guard     General bed mobility comments: Cues for sequence.  Utilization of sheet to self assist R LE  Transfers Overall transfer level: Needs assistance Equipment used: Rolling walker (2 wheeled) Transfers: Sit to/from Stand Sit to Stand: From elevated surface;Supervision         General transfer comment: min cues for LE management  Ambulation/Gait Ambulation/Gait assistance: Min guard;Supervision Ambulation Distance (Feet): 180 Feet Assistive device: Rolling walker (2 wheeled) Gait Pattern/deviations: Step-to pattern;Step-through pattern;Decreased step length - right;Decreased step length - left;Shuffle;Trunk flexed Gait velocity: decr   General Gait Details: cues for posture, position from RW and initial sequence   Stairs Stairs: Yes Stairs assistance: Min assist Stair Management: No rails;Step to pattern;Forwards;With walker Number of Stairs: 2 General stair comments: single step fwd twice with RW and cues for sequence and foot/RW placement  Wheelchair Mobility    Modified Rankin (Stroke Patients Only)       Balance     Sitting  balance-Leahy Scale: Good       Standing balance-Leahy Scale: Fair                      Cognition Arousal/Alertness: Awake/alert Behavior During Therapy: WFL for tasks assessed/performed Overall Cognitive Status: Within Functional Limits for tasks assessed                      Exercises Total Joint Exercises Ankle Circles/Pumps: AROM;Both;15 reps;Supine Quad Sets: AROM;Both;10 reps;Supine Heel Slides: AAROM;20 reps;Right;Supine Hip ABduction/ADduction: AAROM;Right;15 reps;Supine    General Comments        Pertinent Vitals/Pain Pain Assessment: 0-10 Pain Score: 2  Pain Location: R hip Pain Descriptors / Indicators: Tightness Pain Intervention(s): Limited activity within patient's tolerance;Monitored during session;Premedicated before session;Ice applied    Home Living                      Prior Function            PT Goals (current goals can now be found in the care plan section) Acute Rehab PT Goals Patient Stated Goal: to walk, get this  leg back strong PT Goal Formulation: With patient Time For Goal Achievement: 02/15/16 Potential to Achieve Goals: Good Progress towards PT goals: Progressing toward goals    Frequency  7X/week    PT Plan Current plan remains appropriate    Co-evaluation             End of Session   Activity Tolerance: Patient tolerated treatment well Patient left: in chair;with call bell/phone within reach     Time: 0902-0942 PT Time Calculation (min) (ACUTE ONLY): 40 min  Charges:  $  Gait Training: 8-22 mins $Therapeutic Exercise: 8-22 mins $Therapeutic Activity: 8-22 mins                    G Codes:      Roe Koffman March 03, 2016, 11:40 AM

## 2016-02-14 NOTE — Discharge Summary (Signed)
Physician Discharge Summary  Samuel Goodwin ID: Samuel Goodwin MRN: [id number] DOB/AGE: 55/02/1961 55 y.o.  Admit date: 02/05/2016 Discharge date: 02/09/2016   Procedures:  Procedure(s) (LRB): ANTERIOR HIP REVISION (Right)  Attending Physician:  Dr. Paralee Cancel   Admission Diagnoses:   Right total hip dislocation / instability / pain  Discharge Diagnoses:  Principal Problem:   S/P right TH revision Active Problems:   Obese   Hip instability   H/O total hip arthroplasty  Past Medical History  Diagnosis Date  . Hyperlipidemia   . Hypertension   . Hypothyroidism   . GERD (gastroesophageal reflux disease)   . Alcohol abuse     Sobriety since 1990  . Benign positional vertigo   . OSA (obstructive sleep apnea)     on CPAP  . Anxiety   . Depression   . PTSD (post-traumatic stress disorder)   . Arthritis   . Hepatitis C 1994    hep c - ? etiology; in [place] 1989-91; S/P interferon , Dr [name]  . PONV (postoperative nausea and vomiting)     following knee surgery last year   . History of bronchitis   . Eardrum trauma     30 years ago/right ear  . Hoarseness   . Headache   . Vocal cord polyps     HPI:    S/P Right THA, AA by Dr. Alvan Dame 02/01/16. Reports Samuel Goodwin rolled over in bed and felt sudden pain in the hip. Presented to ER and xrays show dislocation. Dr. Gladstone Lighter attempted to reduce the hip and found that the hip was unstable. Pt was admitted so that Dr. Alvan Dame could evaluate and come up with a plan.  Options were discussed with the Samuel Goodwin and it was felt Samuel Goodwin would benefit from exploration of the hip and see if an issue could be found.  Risks, benefits and expectations were discussed with the Samuel Goodwin.  Risks including but not limited to the risk of anesthesia, blood clots, nerve damage, blood vessel damage, failure of the prosthesis, infection and up to and including death.  Samuel Goodwin understand the risks, benefits and expectations and wishes to proceed with surgery.   PCP: [name], MD   Discharged Condition: good  Hospital Course:  Samuel Goodwin underwent the above stated procedure on 02/07/2016. Samuel Goodwin tolerated the procedure well and brought to the recovery room in good condition and subsequently to the floor.  POD #1 BP: 116/78 ; Pulse: 86 ; Temp: 98.5 F (36.9 C) ; Resp: 14 Samuel Goodwin reports pain as mild to moderate. Pain better controlled as night progressed. No events Neurovascular intact and incision: dressing C/D/I  LABS  Basename    HGB     10.8  HCT     32.5   POD #2  BP: 125/87 ; Pulse: 82 ; Temp: 98.1 F (36.7 C) ; Resp: 16 Samuel Goodwin reports pain as mild, pain controlled. No events throughout the night. Feeling more comfortable with his hip again. Able to flex the hip, which Samuel Goodwin states is more than Samuel Goodwin could do yesterday. Dorsiflexion/plantar flexion intact, incision: dressing C/D/I, no cellulitis present and compartment soft.   LABS   No new labs   Discharge Exam: General appearance: alert, cooperative and no distress Extremities: Homans sign is negative, no sign of DVT, no edema, redness or tenderness in the calves or thighs and no ulcers, gangrene or trophic changes  Disposition: Home with follow up in 2 weeks   Follow-up Information    Follow up with [facility]  D, MD. Schedule an appointment as soon as possible for a visit in 2 weeks.   Specialty:  Orthopedic Surgery   Contact information:   [address] [place] [address] [id number]       Discharge Instructions    Call MD / Call 911    Complete by:  As directed   If you experience chest pain or shortness of breath, CALL 911 and be transported to the hospital emergency room.  If you develope a fever above 101 F, pus (white drainage) or increased drainage or redness at the wound, or calf pain, call your surgeon's office.     Change dressing    Complete by:  As directed   Maintain surgical dressing until follow up in the clinic. If the edges start to pull up,  may reinforce with tape. If the dressing is no longer working, may remove and cover with gauze and tape, but must keep the area dry and clean.  Call with any questions or concerns.     Constipation Prevention    Complete by:  As directed   Drink plenty of fluids.  Prune juice may be helpful.  You may use a stool softener, such as Colace (over the counter) 100 mg twice a day.  Use MiraLax (over the counter) for constipation as needed.     Diet - low sodium heart healthy    Complete by:  As directed      Discharge instructions    Complete by:  As directed   Maintain surgical dressing until follow up in the clinic. If the edges start to pull up, may reinforce with tape. If the dressing is no longer working, may remove and cover with gauze and tape, but must keep the area dry and clean.  Follow up in 2 weeks at [facility]. Call with any questions or concerns.     Increase activity slowly as tolerated    Complete by:  As directed   Weight bearing as tolerated with assist device (walker, cane, etc) as directed, use it as long as suggested by your surgeon or therapist, typically at least 4-6 weeks.     TED hose    Complete by:  As directed   Use stockings (TED hose) for 2 weeks on both leg(s).  You may remove them at night for sleeping.             Medication List    TAKE these medications        aspirin 325 MG EC tablet  Take 1 tablet (325 mg total) by mouth 2 (two) times daily.     docusate sodium 100 MG capsule  Commonly known as:  COLACE  Take 1 capsule (100 mg total) by mouth 2 (two) times daily.     ferrous sulfate 325 (65 FE) MG tablet  Take 1 tablet (325 mg total) by mouth 3 (three) times daily after meals.     hydrochlorothiazide 25 MG tablet  Commonly known as:  HYDRODIURIL  Take 1 tablet (25 mg total) by mouth daily.     HYDROcodone-acetaminophen 7.5-325 MG tablet  Commonly known as:  NORCO  Take 1-2 tablets by mouth every 4 (four) hours as needed for  moderate pain.     LEVOTHROID 25 MCG tablet  Generic drug:  levothyroxine  Take 25 mcg by mouth daily before breakfast.     levothyroxine 200 MCG tablet  Commonly known as:  SYNTHROID, LEVOTHROID  Take 200 mcg by mouth  daily before breakfast.     LIVALO 2 MG Tabs  Generic drug:  Pitavastatin Calcium  Take 2 mg by mouth at bedtime.     losartan 50 MG tablet  Commonly known as:  COZAAR  Take 50 mg by mouth daily.     magnesium oxide 400 MG tablet  Commonly known as:  MAG-OX  Take 400 mg by mouth daily.     methocarbamol 500 MG tablet  Commonly known as:  ROBAXIN  Take 1 tablet (500 mg total) by mouth every 6 (six) hours as needed for muscle spasms.     multivitamin tablet  Take 1 tablet by mouth every morning.     omeprazole 20 MG capsule  Commonly known as:  PRILOSEC  Take 20 mg by mouth 2 (two) times daily before a meal.     polyethylene glycol packet  Commonly known as:  MIRALAX / GLYCOLAX  Take 17 g by mouth 2 (two) times daily.         Signed: West Pugh. Abby Stines   PA-C  02/14/2016, 3:40 PM

## 2016-08-10 DIAGNOSIS — D020 Carcinoma in situ of larynx: Secondary | ICD-10-CM

## 2016-08-10 HISTORY — DX: Carcinoma in situ of larynx: D02.0

## 2016-08-15 ENCOUNTER — Encounter: Payer: Self-pay | Admitting: Cardiovascular Disease

## 2016-08-15 ENCOUNTER — Ambulatory Visit (INDEPENDENT_AMBULATORY_CARE_PROVIDER_SITE_OTHER): Payer: Managed Care, Other (non HMO) | Admitting: Cardiovascular Disease

## 2016-08-15 VITALS — BP 122/88 | HR 80 | Ht 72.0 in | Wt 284.0 lb

## 2016-08-15 DIAGNOSIS — I10 Essential (primary) hypertension: Secondary | ICD-10-CM | POA: Diagnosis not present

## 2016-08-15 DIAGNOSIS — R079 Chest pain, unspecified: Secondary | ICD-10-CM | POA: Diagnosis not present

## 2016-08-15 DIAGNOSIS — G4730 Sleep apnea, unspecified: Secondary | ICD-10-CM

## 2016-08-15 DIAGNOSIS — R0789 Other chest pain: Secondary | ICD-10-CM

## 2016-08-15 NOTE — Assessment & Plan Note (Signed)
History of hyperlipidemia on Livalo followed by his PCP

## 2016-08-15 NOTE — Assessment & Plan Note (Signed)
History of hypertension blood pressure measured at 122/88. He is on hydrochlorothiazide and losartan. Continue current meds at current dosing

## 2016-08-15 NOTE — Assessment & Plan Note (Signed)
History of obstructive sleep apnea on CPAP Which he benefits from

## 2016-08-15 NOTE — Progress Notes (Signed)
08/15/2016 Samuel Goodwin   March 07, 1961  [id number]  Primary Physician [name], MD Primary Cardiologist: Lorretta Harp MD Lupe Carney, [place]  HPI:  The patient is a very pleasant 55 year old, moderately overweight, married Caucasian male, father of 3 who I last saw 10/30/14. He has a strong family history for heart disease along with hypertension and hyperlipidemia. He has obstructive sleep apnea on CPAP which he benefits from.Samuel Goodwin He is trying to lose weight with marginal success. 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 He has also recently had throat cancer surgically addressed at [facility]. He does complain of atypical chest pain and has not had a functional study in over 5 years.    Current Outpatient Prescriptions  Medication Sig Dispense Refill  . hydrochlorothiazide (HYDRODIURIL) 25 MG tablet Take 1 tablet (25 mg total) by mouth daily. (Patient taking differently: Take 25 mg by mouth every morning. ) 90 tablet 3  . levothyroxine (SYNTHROID, LEVOTHROID) 25 MCG tablet Take 25 mcg by mouth daily before breakfast.    . losartan (COZAAR) 50 MG tablet Take 50 mg by mouth daily.    . magnesium oxide (MAG-OX) 400 MG tablet Take 400 mg by mouth daily.    . Multiple Vitamin (MULTIVITAMIN) tablet Take 1 tablet by mouth every morning.     . Pitavastatin Calcium (LIVALO) 2 MG TABS Take 2 mg by mouth at bedtime.     No current facility-administered medications for this visit.     Allergies  Allergen Reactions  . Dilaudid [Hydromorphone Hcl] Nausea And Vomiting    "Questionable as to whether it was actually dilaudid  That made me so severely nauseous and vomit"  . Benazepril Hcl Cough    Social History   Social History  . Marital status: Married    Spouse name: N/A  . Number of children: N/A  . Years of education: N/A   Occupational History  . Not on file.   Social History Main Topics  . Smoking  status: Never Smoker  . Smokeless tobacco: Never Used     Comment: Quit at age 45  . Alcohol use No     Comment: alcoholism quit 30 years ago   . Drug use: No  . Sexual activity: Yes   Other Topics Concern  . Not on file   Social History Narrative  . No narrative on file     Review of Systems: General: negative for chills, fever, night sweats or weight changes.  Cardiovascular: negative for chest pain, dyspnea on exertion, edema, orthopnea, palpitations, paroxysmal nocturnal dyspnea or shortness of breath Dermatological: negative for rash Respiratory: negative for cough or wheezing Urologic: negative for hematuria Abdominal: negative for nausea, vomiting, diarrhea, bright red blood per rectum, melena, or hematemesis Neurologic: negative for visual changes, syncope, or dizziness All other systems reviewed and are otherwise negative except as noted above.    Blood pressure 122/88, pulse 80, height 6' (1.829 m), weight 284 lb (128.8 kg).  General appearance: alert and no distress Neck: no adenopathy, no carotid bruit, no JVD, supple, symmetrical, trachea midline and thyroid not enlarged, symmetric, no tenderness/mass/nodules Lungs: clear to auscultation bilaterally Heart: regular rate and rhythm, S1, S2 normal, no murmur, click, rub or gallop Extremities: extremities normal, atraumatic, no cyanosis or edema  EKG normal sinus rhythm at 80 with nonspecific ST and T-wave changes. I personally reviewed the EKG  ASSESSMENT AND PLAN:   HYPERLIPIDEMIA History  of hyperlipidemia on Livalo followed by his PCP  Essential hypertension History of hypertension blood pressure measured at 122/88. He is on hydrochlorothiazide and losartan. Continue current meds at current dosing  Sleep apnea History of obstructive sleep apnea on CPAP Which he benefits from  Chest pain The patient complains of atypical chest pain. He does have risk factors including a strong family history of heart  disease, hypertension and hyperlipidemia. He has not had a functional study in over 5 years. I'm going to order an exercise Myoview stress test to rule out an ischemic etiology.      Lorretta Harp MD FACP,FACC,FAHA, FSCAI 08/15/2016 9:00 AM

## 2016-08-15 NOTE — Assessment & Plan Note (Signed)
The patient complains of atypical chest pain. He does have risk factors including a strong family history of heart disease, hypertension and hyperlipidemia. He has not had a functional study in over 5 years. I'm going to order an exercise Myoview stress test to rule out an ischemic etiology.

## 2016-08-15 NOTE — Patient Instructions (Signed)
Medication Instructions:  Your physician recommends that you continue on your current medications as directed. Please refer to the Current Medication list given to you today.   Testing/Procedures: Your physician has requested that you have en exercise stress myoview. For further information please visit [email or web address]. Please follow instruction sheet, as given.   Follow-Up: Your physician wants you to follow-up in: [place]. You will receive a reminder letter in the mail two months in advance. If you don't receive a letter, please call our office to schedule the follow-up appointment.   Any Other Special Instructions Will Be Listed Below (If Applicable).  Exercise Stress Electrocardiogram An exercise stress electrocardiogram is a test that is done to evaluate the blood supply to your heart. This test may also be called exercise stress electrocardiography. The test is done while you are walking on a treadmill. The goal of this test is to raise your heart rate. This test is done to find areas of poor blood flow to the heart by determining the extent of coronary artery disease (CAD).   CAD is defined as narrowing in one or more heart (coronary) arteries of more than 70%. If you have an abnormal test result, this may mean that you are not getting adequate blood flow to your heart during exercise. Additional testing may be needed to understand why your test was abnormal. LET [facility] CARE PROVIDER KNOW ABOUT:   Any allergies you have.  All medicines you are taking, including vitamins, herbs, eye drops, creams, and over-the-counter medicines.  Previous problems you or members of your family have had with the use of anesthetics.  Any blood disorders you have.  Previous surgeries you have had.  Medical conditions you have.  Possibility of pregnancy, if this applies. RISKS AND COMPLICATIONS Generally, this is a safe procedure. However, as with any procedure,  complications can occur. Possible complications can include:  Pain or pressure in the following areas:  Chest.  Jaw or neck.  Between your shoulder blades.  Radiating down your left arm.  Dizziness or light-headedness.  Shortness of breath.  Increased or irregular heartbeats.  Nausea or vomiting.  Heart attack (rare). BEFORE THE PROCEDURE  Avoid all forms of caffeine 24 hours before your test or as directed by your health care provider. This includes coffee, tea (even decaffeinated tea), caffeinated sodas, chocolate, cocoa, and certain pain medicines.  Follow your health care provider's instructions regarding eating and drinking before the test.  Take your medicines as directed at regular times with water unless instructed otherwise. Exceptions may include:  If you have diabetes, ask how you are to take your insulin or pills. It is common to adjust insulin dosing the morning of the test.  If you are taking beta-blocker medicines, it is important to talk to your health care provider about these medicines well before the date of your test. Taking beta-blocker medicines may interfere with the test. In some cases, these medicines need to be changed or stopped 24 hours or more before the test.  If you wear a nitroglycerin patch, it may need to be removed prior to the test. Ask your health care provider if the patch should be removed before the test.  If you use an inhaler for any breathing condition, bring it with you to the test.  If you are an outpatient, bring a snack so you can eat right after the stress phase of the test.  Do not smoke for 4 hours prior to the test  or as directed by your health care provider.  Do not apply lotions, powders, creams, or oils on your chest prior to the test.  Wear loose-fitting clothes and comfortable shoes for the test. This test involves walking on a treadmill. PROCEDURE  Multiple patches (electrodes) will be put on your chest. If needed,  small areas of your chest may have to be shaved to get better contact with the electrodes. Once the electrodes are attached to your body, multiple wires will be attached to the electrodes and your heart rate will be monitored.  Your heart will be monitored both at rest and while exercising.  You will walk on a treadmill. The treadmill will be started at a slow pace. The treadmill speed and incline will gradually be increased to raise your heart rate. AFTER THE PROCEDURE  Your heart rate and blood pressure will be monitored after the test.  You may return to your normal schedule including diet, activities, and medicines, unless your health care provider tells you otherwise.   This information is not intended to replace advice given to you by your health care provider. Make sure you discuss any questions you have with your health care provider.   Document Released: 11/24/2000 Document Revised: 12/02/2013 Document Reviewed: 08/04/2013 Elsevier Interactive Patient Education [employer].     If you need a refill on your cardiac medications before your next appointment, please call your pharmacy.

## 2016-08-24 ENCOUNTER — Telehealth (HOSPITAL_COMMUNITY): Payer: Self-pay

## 2016-08-24 NOTE — Telephone Encounter (Signed)
Encounter complete. 

## 2016-08-29 ENCOUNTER — Ambulatory Visit (HOSPITAL_COMMUNITY)
Admission: RE | Admit: 2016-08-29 | Discharge: 2016-08-29 | Disposition: A | Discharge status: Home / Self Care | Payer: Managed Care, Other (non HMO) | Source: Ambulatory Visit | Attending: Cardiovascular Disease | Admitting: Cardiovascular Disease

## 2016-08-29 DIAGNOSIS — R079 Chest pain, unspecified: Secondary | ICD-10-CM | POA: Insufficient documentation

## 2016-08-29 MED ORDER — TECHNETIUM TC 99M TETROFOSMIN IV KIT
28.0000 | PACK | Freq: Once | INTRAVENOUS | Status: AC | PRN
  Administered 2016-08-29: 28 via INTRAVENOUS
  Filled 2016-08-29: qty 28

## 2016-08-30 ENCOUNTER — Ambulatory Visit (HOSPITAL_COMMUNITY)
Admission: RE | Admit: 2016-08-30 | Discharge: 2016-08-30 | Disposition: A | Discharge status: Home / Self Care | Payer: Managed Care, Other (non HMO) | Source: Ambulatory Visit | Attending: Internal Medicine | Admitting: Internal Medicine

## 2016-08-30 LAB — MYOCARDIAL PERFUSION IMAGING
CHL CUP MPHR: 166 {beats}/min
CHL CUP NUCLEAR SRS: 0
CHL RATE OF PERCEIVED EXERTION: 16
CSEPED: 9 min
Estimated workload: 10.1 METS
Exercise duration (sec): 1 s
LVDIAVOL: 123 mL (ref 62–150)
LVSYSVOL: 64 mL
NUC STRESS TID: 0.98
Peak HR: 146 {beats}/min
Percent HR: 87 %
Rest HR: 64 {beats}/min
SDS: 1
SSS: 1

## 2016-08-30 MED ORDER — TECHNETIUM TC 99M TETROFOSMIN IV KIT
30.2000 | PACK | Freq: Once | INTRAVENOUS | Status: AC | PRN
  Administered 2016-08-30: 30.2 via INTRAVENOUS

## 2016-09-27 ENCOUNTER — Ambulatory Visit: Payer: Self-pay | Admitting: Cardiovascular Disease

## 2016-10-02 ENCOUNTER — Telehealth: Payer: Self-pay | Admitting: Cardiovascular Disease

## 2016-10-02 NOTE — Telephone Encounter (Signed)
Pt calling to get written RX for a new BiPAP machine, would like to pick it up-call when ready @ # [phone]  and have it faxed to [facility] @ [phone]

## 2016-10-26 NOTE — Telephone Encounter (Signed)
This patient needs appointment to be seen by Dr Claiborne Billings.

## 2016-10-30 NOTE — Telephone Encounter (Signed)
msg left for patient to call. 

## 2016-10-31 ENCOUNTER — Telehealth: Payer: Self-pay | Admitting: Cardiovascular Disease

## 2016-10-31 NOTE — Telephone Encounter (Signed)
Requesting surgical clearance:  1. Type of surgery: Left Hip: THA w/wo autograft/allograft  2. Surgeon: Charyl Bigger. Alvan Dame, MD  3.Surgical Date:12/19/16  4. Any medications to be held?   5. CAD: No  6. I will defer to:  Dr. Pearla Dubonnet Information:   [place]  Phone: [phone] Fax: [phone]

## 2016-11-01 ENCOUNTER — Encounter: Payer: Self-pay | Admitting: Cardiovascular Disease

## 2016-11-01 NOTE — Telephone Encounter (Signed)
Low risk MV. Cleared for surgery at low risk

## 2016-11-01 NOTE — Telephone Encounter (Signed)
Faxed Clearance to number provided.

## 2016-11-21 ENCOUNTER — Telehealth: Payer: Self-pay | Admitting: Cardiovascular Disease

## 2016-11-21 NOTE — Telephone Encounter (Signed)
Patient wants to know how to get new equipment for his c-pap machine.  He said he has had this one for 5 years.

## 2016-11-22 NOTE — Telephone Encounter (Signed)
Left message to call back - patient needs an office appointment with dr Claiborne Billings- to discuss   appointment 11/24/16 :3:45

## 2016-11-22 NOTE — Telephone Encounter (Signed)
SPOKE TO PATIENT WILL BE ABLE TO COME TO APPOINTMENT.VERBALIZED UNDERSTANDING.

## 2016-11-24 ENCOUNTER — Ambulatory Visit (INDEPENDENT_AMBULATORY_CARE_PROVIDER_SITE_OTHER): Payer: Managed Care, Other (non HMO) | Admitting: Cardiovascular Disease

## 2016-11-24 ENCOUNTER — Encounter: Payer: Self-pay | Admitting: Cardiovascular Disease

## 2016-11-24 VITALS — BP 121/74 | HR 77 | Ht 72.0 in | Wt 291.0 lb

## 2016-11-24 DIAGNOSIS — G4733 Obstructive sleep apnea (adult) (pediatric): Secondary | ICD-10-CM | POA: Diagnosis not present

## 2016-11-24 DIAGNOSIS — Z6839 Body mass index (BMI) 39.0-39.9, adult: Secondary | ICD-10-CM

## 2016-11-24 DIAGNOSIS — IMO0001 Reserved for inherently not codable concepts without codable children: Secondary | ICD-10-CM

## 2016-11-24 DIAGNOSIS — E6609 Other obesity due to excess calories: Secondary | ICD-10-CM

## 2016-11-24 DIAGNOSIS — E7800 Pure hypercholesterolemia, unspecified: Secondary | ICD-10-CM | POA: Diagnosis not present

## 2016-11-24 DIAGNOSIS — I10 Essential (primary) hypertension: Secondary | ICD-10-CM | POA: Diagnosis not present

## 2016-11-24 NOTE — Patient Instructions (Signed)
Your physician recommends that you schedule a follow-up appointment in: 2-3 months with Dr Claiborne Billings for sleep.

## 2016-11-27 ENCOUNTER — Encounter: Payer: Self-pay | Admitting: Cardiovascular Disease

## 2016-11-27 NOTE — Progress Notes (Signed)
Cardiology Office Note    Date:  11/27/2016   ID:  Samuel Goodwin, DOB 04-09-1961, MRN [id number]  PCP:  [name], MD  Cardiologist:  Shelva Majestic, MD (sleep); Dr. Quay Burow  New sleep evaluation  History of Present Illness:  Samuel Goodwin is a 55 y.o. male  who is referred for sleep evaluation.  Mr. Befort has a history of obesity, hypertension, hyperlipidemia, and hypothyroidism.  In 2012.  He underwent a sleep study which revealed moderate sleep apnea overall with an HI 16.9 per hour but severe sleep apnea during REM sleep.  He underwent CPAP titration and ultimately was titrated with BiPAP and a pressure of 19/15 was recommended.  He has been on the same pressure for over 5 years.  Recently, his machine has begun to malfunction.  Oftentimes it would stop and he would have to restart it.  He admits to 100% compliance.  He cannot sleep without it.  He brought his machine with him today.  He typically goes to bed at 10:30 at night and wakes up at 5:15 AM.  I have interrogated his machine, which confirms 100% usage.  He is set on a BiPAP of 19/15 with bi-Flex of 3.  He is averaging 7 hours and 6 minutes of sleep per night.  AHI is 1.2.  There is no periodic breathing.  While we were interrogated in the machine, the machine cut off in the same fashion that it had been malfunctioning at his house.  He presents for evaluation.  He denies breakthrough snoring.  He denies residual daytime sleepiness.  His sleep is restorative with BiPAP.  An Epworth scale was calculated in the office today and this endorsed as 3 as shown below.   Epworth Sleepiness Scale: Situation   Chance of Dozing/Sleeping (0 = never , 1 = slight chance , 2 = moderate chance , 3 = high chance )   sitting and reading 1   watching TV 1   sitting inactive in a public place 1   being a passenger in a motor vehicle for an hour or more 0   lying down in the afternoon 0   sitting and talking to someone 0   sitting  quietly after lunch (no alcohol) 0   while stopped for a few minutes in traffic as the driver 0   Total Score  3   Past Medical History:  Diagnosis Date  . Alcohol abuse    Sobriety since 1990  . Anxiety   . Arthritis   . Benign positional vertigo   . Depression   . Eardrum trauma    30 years ago/right ear  . GERD (gastroesophageal reflux disease)   . Headache   . Hepatitis C 1994   hep c - ? etiology; in [place] 1989-91; S/P interferon , Dr [name]  . History of bronchitis   . Hoarseness   . Hyperlipidemia   . Hypertension   . Hypothyroidism   . OSA (obstructive sleep apnea)    on CPAP  . PONV (postoperative nausea and vomiting)    following knee surgery last year   . PTSD (post-traumatic stress disorder)   . Vocal cord polyps     Past Surgical History:  Procedure Laterality Date  . ANTERIOR HIP REVISION Right 02/07/2016   Procedure: ANTERIOR HIP REVISION;  Surgeon: Paralee Cancel, MD;  Location: [facility];  Service: Orthopedics;  Laterality: Right;  . CARDIOVASCULAR STRESS TEST  02/09/2010   No scintigraphic evidence  of inducible ischemia.  [name] HIP CLOSED REDUCTION Right 02/05/2016   Procedure: CLOSED MANIPULATION HIP;  Surgeon: Latanya Maudlin, MD;  Location: [facility];  Service: Orthopedics;  Laterality: Right;  . knee arthroscopic Bilateral   . LARYNGOSCOPY  08/2009   Dr.[name]  . THROAT SURGERY     vocal cord  laser sugery X 3, Dr [name] , [facility]  . TOTAL HIP ARTHROPLASTY Right 02/01/2016   Procedure: RIGHT TOTAL HIP ARTHROPLASTY ANTERIOR APPROACH;  Surgeon: Paralee Cancel, MD;  Location: [facility];  Service: Orthopedics;  Laterality: Right;  . TOTAL KNEE ARTHROPLASTY Left 11/27/2014   dr [name]  . TOTAL KNEE ARTHROPLASTY Left 11/27/2014   Procedure: LEFT TOTAL KNEE ARTHROPLASTY;  Surgeon: Augustin Schooling, MD;  Location: [facility];  Service: Orthopedics;  Laterality: Left;  . TRANSTHORACIC ECHOCARDIOGRAM  11/08/2005   EF 68%, normal LV systolic function  . UPPER GASTROINTESTINAL ENDOSCOPY   2010   Negative, Dr.[name]    Current Medications: Outpatient Medications Prior to Visit  Medication Sig Dispense Refill  . hydrochlorothiazide (HYDRODIURIL) 25 MG tablet Take 1 tablet (25 mg total) by mouth daily. (Patient taking differently: Take 25 mg by mouth every morning. ) 90 tablet 3  . levothyroxine (SYNTHROID, LEVOTHROID) 25 MCG tablet Take 25 mcg by mouth daily before breakfast.    . losartan (COZAAR) 50 MG tablet Take 50 mg by mouth daily.    . magnesium oxide (MAG-OX) 400 MG tablet Take 400 mg by mouth daily.    . Multiple Vitamin (MULTIVITAMIN) tablet Take 1 tablet by mouth every morning.     . Pitavastatin Calcium (LIVALO) 2 MG TABS Take 2 mg by mouth at bedtime.     No facility-administered medications prior to visit.      Allergies:   Dilaudid [hydromorphone hcl] and Benazepril hcl   Social History   Social History  . Marital status: Married    Spouse name: N/A  . Number of children: N/A  . Years of education: N/A   Social History Main Topics  . Smoking status: Never Smoker  . Smokeless tobacco: Never Used     Comment: Quit at age 1  . Alcohol use No     Comment: alcoholism quit 30 years ago   . Drug use: No  . Sexual activity: Yes   Other Topics Concern  . None   Social History Narrative  . 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  He is married.  There is no tobacco use.  Family History:  The patient's family history includes Aneurysm in his maternal aunt; Cirrhosis in his father; Coronary artery disease in his maternal uncle; Diabetes in his father; Heart attack (age of onset: 66) in his brother; Heart failure in his mother; Hypertension in his mother; Stroke in his father; Thyroid disease in his mother.   ROS General: Negative; No fevers, chills, or night sweats; positive for obesity HEENT: Negative; No changes in vision or hearing, sinus congestion, difficulty  swallowing Pulmonary: Negative; No cough, wheezing, shortness of breath, hemoptysis Cardiovascular: Negative; No chest pain, presyncope, syncope, palpitations GI: Negative; No nausea, vomiting, diarrhea, or abdominal pain GU: Negative; No dysuria, hematuria, or difficulty voiding Musculoskeletal: Negative; no myalgias, joint pain, or weakness Hematologic/Oncology: Negative; no easy bruising, bleeding Endocrine: Negative; no heat/cold intolerance; no diabetes Neuro: Negative; no changes in balance, headaches Skin: Negative; No rashes or skin lesions Psychiatric: Negative; No behavioral problems, depression Sleep: Positive  for sleep apnea on BiPAP at 19/15 with bi-Flex of 3.  No residual snoring, daytime sleepiness, hypersomnolence, bruxism, restless legs, hypnogognic hallucinations, no cataplexy Other comprehensive 14 point system review is negative.   PHYSICAL EXAM:   VS:  BP 121/74   Pulse 77   Ht 6' (1.829 m)   Wt 291 lb (132 kg)   BMI 39.47 kg/m    Wt Readings from Last 3 Encounters:  11/24/16 291 lb (132 kg)  08/29/16 284 lb (128.8 kg)  08/15/16 284 lb (128.8 kg)    General: Alert, oriented, no distress.  Skin: normal turgor, no rashes, warm and dry HEENT: Normocephalic, atraumatic. Pupils equal round and reactive to light; sclera anicteric; extraocular muscles intact; Fundi Disks flat.  No hemorrhages or exudates. Nose without nasal septal hypertrophy Mouth/Parynx benign; Mallinpatti scale 3/4 Neck: No JVD, no carotid bruits; normal carotid upstroke Lungs: clear to ausculatation and percussion; no wheezing or rales Chest wall: without tenderness to palpitation Heart: PMI not displaced, RRR, s1 s2 normal, 1/6 systolic murmur, no diastolic murmur, no rubs, gallops, thrills, or heaves Abdomen: soft, nontender; no hepatosplenomehaly, BS+; abdominal aorta nontender and not dilated by palpation. Back: no CVA tenderness Pulses 2+ Musculoskeletal: full range of motion, normal  strength, no joint deformities Extremities: no clubbing cyanosis or edema, Homan's sign negative  Neurologic: grossly nonfocal; Cranial nerves grossly wnl Psychologic: Normal mood and affect   Studies/Labs Reviewed:   EKG:  EKG is not  ordered today.  I reviewed his ECG from 08/15/2016, which was independently reviewed by me and reveals normal sinus rhythm 80 bpm without significant ST-T changes.  There is no ectopy.  Recent Labs: BMP Latest Ref Rng & Units 02/08/2016 02/05/2016 02/02/2016  Glucose 65 - 99 mg/dL 111(H) 103(H) 175(H)  BUN 6 - 20 mg/dL _0 Creatinine 0.61 - 1.24 mg/dL 0.75 0.70 0.81  Sodium 135 - 145 mmol/L 139 137 137  Potassium 3.5 - 5.1 mmol/L 4.2 3.1(L) 4.1  Chloride 101 - 111 mmol/L 103 94(L) 103  CO2 22 - 32 mmol/L 28 - 25  Calcium 8.9 - 10.3 mg/dL 8.2(L) - 8.5(L)     Hepatic Function Latest Ref Rng & Units 11/19/2014 11/03/2014 10/30/2013  Total Protein 6.0 - 8.3 g/dL 7.1 5.7(L) 6.6  Albumin 3.5 - 5.2 g/dL 3.8 3.6 4.1  AST 0 - 37 U/L _1 ALT 0 - 53 U/L _2 Alk Phosphatase 39 - 117 U/L 77 59 64  Total Bilirubin 0.3 - 1.2 mg/dL 0.5 0.5 0.5  Bilirubin, Direct 0.0 - 0.3 mg/dL - 0.1 0.1    CBC Latest Ref Rng & Units 02/08/2016 02/05/2016 02/02/2016  WBC 4.0 - 10.5 K/uL 7.6 - 9.8  Hemoglobin 13.0 - 17.0 g/dL 10.8(L) 11.2(L) 11.8(L)  Hematocrit 39.0 - 52.0 % 32.5(L) 33.0(L) 35.8(L)  Platelets 150 - 400 K/uL 225 - 228   Lab Results  Component Value Date   MCV 89.3 02/08/2016   MCV 88.2 02/02/2016   MCV 88.1 01/20/2016   Lab Results  Component Value Date   TSH 1.48 08/28/2014   Lab Results  Component Value Date   HGBA1C 5.3 01/03/2010     BNP No results found for: BNP  ProBNP No results found for: PROBNP   Lipid Panel     Component Value Date/Time   CHOL 142 11/03/2014 0800   TRIG 95 11/03/2014 0800   HDL 44 11/03/2014 0800   CHOLHDL 3.2 11/03/2014 0800   VLDL  19 11/03/2014 0800   LDLCALC 79 11/03/2014 0800      RADIOLOGY: No results found.   Additional studies/ records that were reviewed today include:  I reviewed patient's PSG from 08/09/2011 and CPAP/BiPAP titration study of 09/06/2011.  I reviewed Dr. Kennon Holter notes and prior ECG.    ASSESSMENT:    1. OSA (obstructive sleep apnea)   2. Essential hypertension   3. Pure hypercholesterolemia   4. Class 2 obesity due to excess calories with serious comorbidity and body mass index (BMI) of 39.0 to 39.9 in adult      PLAN:  Mr. Zyon Rosser is a 55 year old gentleman who has a history of obesity, bordering morbid obesity with a BMI of 39.5.  He was found to have moderate sleep apnea.  Overall, but severe sleep apnea during Rams sleep on a diagnostic polysomnogram of 08/09/2011.  He had significant oxygen desaturation to 79% with non-REM sleep and 74% with REM sleep.  Since that time, he has been on BiPAP therapy at 19/15 with bi-Flex.  He continues to use BiPAP with 100% compliance and his AHI is seen on his machine is excellent at 1.2.  He is not having any leak.  He will be using [employer] for DME company based on his [employer].  His machine has begun to malfunction and we witnessed this malfunction in the office today.  I wrote a new  prescription for a new BiPAP auto machine and will initially set this at 19/15 as was previously prescribed.  I will see him in a sleep clinic in follow-up of his new machine for compliance guidelines and further she and adjustment if necessary.  We discussed the importance of weight loss and increased exercise.  His blood pressure today on losartan 50 mg daily and HCTZ is stable.   Medication Adjustments/Labs and Tests Ordered: Current medicines are reviewed at length with the patient today.  Concerns regarding medicines are outlined above.  Medication changes, Labs and Tests ordered today are listed in the Patient Instructions below. Patient Instructions  Your physician recommends that you schedule a  follow-up appointment in: 2-3 months with Dr Claiborne Billings for sleep.      Time spent: 25 minutes Signed, Shelva Majestic, MD  11/27/2016 5:12 PM    [facility] [address], [place], [place],   [address] Phone: [phone]

## 2016-11-28 ENCOUNTER — Telehealth: Payer: Self-pay | Admitting: Cardiovascular Disease

## 2016-11-28 NOTE — Telephone Encounter (Signed)
Returned call to patient.He stated he was calling to tell Mariann Laster his cpap prescription has not been faxed to [facility].Abbie Sons out of office today.I will send message to her.

## 2016-11-28 NOTE — Telephone Encounter (Signed)
New message    Patient calling regarding CPAP information.     Fax # [phone].

## 2016-11-29 ENCOUNTER — Telehealth: Payer: Self-pay | Admitting: *Deleted

## 2016-11-29 NOTE — Telephone Encounter (Signed)
Faxed order for new BIPAP machine and last office note to [facility].

## 2016-11-29 NOTE — Telephone Encounter (Signed)
Pt calling again today,he is checking on the status of his prescription. Pt says he must have this prescription please before the end of the year.

## 2016-11-30 NOTE — H&P (Signed)
TOTAL HIP ADMISSION H&P  Patient is admitted for left total hip arthroplasty, anterior approach.  Subjective:  Chief Complaint:     Left hip primary OA / pain  HPI: Samuel Goodwin, 55 y.o. male, has a history of pain and functional disability in the left hip(s) due to arthritis and patient has failed non-surgical conservative treatments for greater than 12 weeks to include NSAID's and/or analgesics, corticosteriod injections and activity modification.  Onset of symptoms was gradual starting 2+ years ago with gradually worsening course since that time.The patient noted prior procedures of the hip to include arthroplasty on the right hip on Feb 01, 2016 per Dr. Alvan Dame.  Patient currently rates pain in the left hip at 9 out of 10 with activity. Patient has night pain, worsening of pain with activity and weight bearing, trendelenberg gait, pain that interfers with activities of daily living and pain with passive range of motion. Patient has evidence of periarticular osteophytes and joint space narrowing by imaging studies. This condition presents safety issues increasing the risk of falls.  There is no current active infection.  Risks, benefits and expectations were discussed with the patient.  Risks including but not limited to the risk of anesthesia, blood clots, nerve damage, blood vessel damage, failure of the prosthesis, infection and up to and including death.  Patient understand the risks, benefits and expectations and wishes to proceed with surgery.   PCP: [name], MD  D/C Plans:      Home  Post-op Meds:       No Rx given  Tranexamic Acid:      To be given - IV   Decadron:      Is to be given  FYI:     ASA  Norco  CPAP     Patient Active Problem List   Diagnosis Date Noted  . Chest pain 08/15/2016  . S/P right TH revision 02/09/2016  . Hip instability 02/05/2016  . H/O total hip arthroplasty 02/05/2016  . Obese 02/02/2016  . S/P right THA, AA 02/01/2016  . S/P knee  replacement 11/27/2014  . Unspecified viral hepatitis C without hepatic coma 08/28/2014  . DYSPHONIA, CHRONIC 01/06/2010  . NONSPECIFIC ABNORMAL ELECTROCARDIOGRAM 01/06/2010  . HOARSENESS, CHRONIC 05/31/2009  . Hypothyroidism 04/09/2009  . HYPERLIPIDEMIA 04/09/2009  . Essential hypertension 04/09/2009  . GERD 04/09/2009  . Sleep apnea 04/09/2009   Past Medical History:  Diagnosis Date  . Alcohol abuse    Sobriety since 1990  . Anxiety   . Arthritis   . Benign positional vertigo   . Depression   . Eardrum trauma    30 years ago/right ear  . GERD (gastroesophageal reflux disease)   . Headache   . Hepatitis C 1994   hep c - ? etiology; in [place] 1989-91; S/P interferon , Dr [name]  . History of bronchitis   . Hoarseness   . Hyperlipidemia   . Hypertension   . Hypothyroidism   . OSA (obstructive sleep apnea)    on CPAP  . PONV (postoperative nausea and vomiting)    following knee surgery last year   . PTSD (post-traumatic stress disorder)   . Vocal cord polyps     Past Surgical History:  Procedure Laterality Date  . ANTERIOR HIP REVISION Right 02/07/2016   Procedure: ANTERIOR HIP REVISION;  Surgeon: Paralee Cancel, MD;  Location: [facility];  Service: Orthopedics;  Laterality: Right;  . CARDIOVASCULAR STRESS TEST  02/09/2010   No scintigraphic evidence of inducible ischemia.  [name]  HIP CLOSED REDUCTION Right 02/05/2016   Procedure: CLOSED MANIPULATION HIP;  Surgeon: Latanya Maudlin, MD;  Location: [facility];  Service: Orthopedics;  Laterality: Right;  . knee arthroscopic Bilateral   . LARYNGOSCOPY  08/2009   Dr.[name]  . THROAT SURGERY     vocal cord  laser sugery X 3, Dr [name] , [facility]  . TOTAL HIP ARTHROPLASTY Right 02/01/2016   Procedure: RIGHT TOTAL HIP ARTHROPLASTY ANTERIOR APPROACH;  Surgeon: Paralee Cancel, MD;  Location[facility];  Service: Orthopedics;  Laterality: Right;  . TOTAL KNEE ARTHROPLASTY Left 11/27/2014   dr [name]  . TOTAL KNEE ARTHROPLASTY Left 11/27/2014   Procedure:  LEFT TOTAL KNEE ARTHROPLASTY;  Surgeon: Augustin Schooling, MD;  Location: [facility];  Service: Orthopedics;  Laterality: Left;  . TRANSTHORACIC ECHOCARDIOGRAM  11/08/2005   EF 68%, normal LV systolic function  . UPPER GASTROINTESTINAL ENDOSCOPY  2010   Negative, Dr.[name]    No prescriptions prior to admission.   Allergies  Allergen Reactions  . Dilaudid [Hydromorphone Hcl] Nausea And Vomiting    "Questionable as to whether it was actually dilaudid  That made me so severely nauseous and vomit"  . Benazepril Hcl Cough    Social History  Substance Use Topics  . Smoking status: Never Smoker  . Smokeless tobacco: Never Used     Comment: Quit at age 14  . Alcohol use No     Comment: alcoholism quit 30 years ago     Family History  Problem Relation Age of Onset  . Cirrhosis Father   . Diabetes Father   . Stroke Father   . Heart failure Mother   . Hypertension Mother   . Thyroid disease Mother   . Heart attack Brother 75  . Aneurysm Maternal Aunt      AAA  . Coronary artery disease Maternal Uncle     MI late 17s     Review of Systems  Constitutional: Negative.   HENT: Negative.   Eyes: Negative.   Respiratory: Negative.   Cardiovascular: Negative.   Gastrointestinal: Positive for heartburn.  Genitourinary: Negative.   Musculoskeletal: Positive for joint pain.  Skin: Negative.   Endo/Heme/Allergies: Negative.   Psychiatric/Behavioral: Negative.     Objective:  Physical Exam  Constitutional: He is oriented to person, place, and time. He appears well-developed.  HENT:  Head: Normocephalic.  Eyes: Pupils are equal, round, and reactive to light.  Neck: Neck supple. No JVD present. No tracheal deviation present. No thyromegaly present.  Cardiovascular: Normal rate, regular rhythm, normal heart sounds and intact distal pulses.   Respiratory: Effort normal and breath sounds normal. No respiratory distress. He has no wheezes.  GI: Soft. There is no tenderness. There is no  guarding.  Musculoskeletal:       Left hip: He exhibits decreased range of motion, decreased strength, tenderness and bony tenderness. He exhibits no swelling, no deformity and no laceration.  Lymphadenopathy:    He has no cervical adenopathy.  Neurological: He is alert and oriented to person, place, and time.  Skin: Skin is warm and dry.  Psychiatric: He has a normal mood and affect.      Labs:  Estimated body mass index is 39.47 kg/m as calculated from the following:   Height as of 11/24/16: 6' (1.829 m).   Weight as of 11/24/16: 132 kg (291 lb).   Imaging Review Plain radiographs demonstrate severe degenerative joint disease of the left hip(s). The bone quality appears to be good for age and  reported activity level.  Assessment/Plan:  End stage arthritis, left hip(s)  The patient history, physical examination, clinical judgement of the provider and imaging studies are consistent with end stage degenerative joint disease of the left hip(s) and total hip arthroplasty is deemed medically necessary. The treatment options including medical management, injection therapy, arthroscopy and arthroplasty were discussed at length. The risks and benefits of total hip arthroplasty were presented and reviewed. The risks due to aseptic loosening, infection, stiffness, dislocation/subluxation,  thromboembolic complications and other imponderables were discussed.  The patient acknowledged the explanation, agreed to proceed with the plan and consent was signed. Patient is being admitted for inpatient treatment for surgery, pain control, PT, OT, prophylactic antibiotics, VTE prophylaxis, progressive ambulation and ADL's and discharge planning.The patient is planning to be discharged home.      [place] Leoncio Hansen   PA-C  11/30/2016, 10:17 PM

## 2016-12-01 NOTE — Telephone Encounter (Signed)
BIPAP order sent to [facility] on 11/29/16.

## 2016-12-08 ENCOUNTER — Other Ambulatory Visit (HOSPITAL_COMMUNITY): Payer: Self-pay | Admitting: Emergency Medicine

## 2016-12-08 NOTE — Patient Instructions (Addendum)
[facility]win  12/08/2016   Your procedure is scheduled on: Tuesday 12/19/2016  Report to [facility]  [facility] take [facility]  elevators to 3rd floor to  [facility] at [phone].  Call this number if you have problems the morning of surgery [phone]   Remember: ONLY 1 PERSON MAY GO WITH YOU TO SHORT STAY TO GET  READY MORNING OF [facility].  Do not eat food or drink liquids :After Midnight.     Take these medicines the morning of surgery with A SIP OF WATER: Tylenol if needed, Synthroid, Omeprazole (prilosec)                                You may not have any metal on your body including hair pins and              piercings  Do not wear jewelry, make-up, lotions, powders or perfumes, deodorant             Do not wear nail polish.  Do not shave  48 hours prior to surgery.              Men may shave face and neck.   Do not bring valuables to the [facility]. [facility].  Contacts, dentures or bridgework may not be worn into surgery.  Leave suitcase in the car. After surgery it may be brought to your room.    _____________________________________________________________________             [facility] - Preparing for Surgery Before surgery, you can play an important role.  Because skin is not sterile, your skin needs to be as free of germs as possible.  You can reduce the number of germs on your skin by washing with CHG (chlorahexidine gluconate) soap before surgery.  CHG is an antiseptic cleaner which kills germs and bonds with the skin to continue killing germs even after washing. Please DO NOT use if you have an allergy to CHG or antibacterial soaps.  If your skin becomes reddened/irritated stop using the CHG and inform your nurse when you arrive at Short Stay. Do not shave (including legs and underarms) for at least 48 hours prior to the first CHG shower.  You may shave your face/neck. Please follow  these instructions carefully:  1.  Shower with CHG Soap the night before surgery and the  morning of Surgery.  2.  If you choose to wash your hair, wash your hair first as usual with your  normal  shampoo.  3.  After you shampoo, rinse your hair and body thoroughly to remove the  shampoo.                           4.  Use CHG as you would any other liquid soap.  You can apply chg directly  to the skin and wash                       Gently with a scrungie or clean washcloth.  5.  Apply the CHG Soap to your body ONLY FROM THE NECK DOWN.   Do not use on face/ open  Wound or open sores. Avoid contact with eyes, ears mouth and genitals (private parts).                       Wash face,  Genitals (private parts) with your normal soap.             6.  Wash thoroughly, paying special attention to the area where your surgery  will be performed.  7.  Thoroughly rinse your body with warm water from the neck down.  8.  DO NOT shower/wash with your normal soap after using and rinsing off  the CHG Soap.                9.  Pat yourself dry with a clean towel.            10.  Wear clean pajamas.            11.  Place clean sheets on your bed the night of your first shower and do not  sleep with pets. Day of Surgery : Do not apply any lotions/deodorants the morning of surgery.  Please wear clean clothes to the [facility]/surgery center.  FAILURE TO FOLLOW THESE INSTRUCTIONS MAY RESULT IN THE CANCELLATION OF YOUR SURGERY PATIENT SIGNATURE_________________________________  NURSE SIGNATURE__________________________________  ________________________________________________________________________   [facility]  An incentive spirometer is a tool that can help keep your lungs clear and active. This tool measures how well you are filling your lungs with each breath. Taking long deep breaths may help reverse or decrease the chance of developing breathing (pulmonary) problems  (especially infection) following:  A long period of time when you are unable to move or be active. BEFORE THE PROCEDURE   If the spirometer includes an indicator to show your best effort, your nurse or respiratory therapist will set it to a desired goal.  If possible, sit up straight or lean slightly forward. Try not to slouch.  Hold the incentive spirometer in an upright position. INSTRUCTIONS FOR USE  1. Sit on the edge of your bed if possible, or sit up as far as you can in bed or on a chair. 2. Hold the incentive spirometer in an upright position. 3. Breathe out normally. 4. Place the mouthpiece in your mouth and seal your lips tightly around it. 5. Breathe in slowly and as deeply as possible, raising the piston or the ball toward the top of the column. 6. Hold your breath for 3-5 seconds or for as long as possible. Allow the piston or ball to fall to the bottom of the column. 7. Remove the mouthpiece from your mouth and breathe out normally. 8. Rest for a few seconds and repeat Steps 1 through 7 at least 10 times every 1-2 hours when you are awake. Take your time and take a few normal breaths between deep breaths. 9. The spirometer may include an indicator to show your best effort. Use the indicator as a goal to work toward during each repetition. 10. After each set of 10 deep breaths, practice coughing to be sure your lungs are clear. If you have an incision (the cut made at the time of surgery), support your incision when coughing by placing a pillow or rolled up towels firmly against it. Once you are able to get out of bed, walk around indoors and cough well. You may stop using the incentive spirometer when instructed by your caregiver.  RISKS AND COMPLICATIONS  Take your time so you do not get  dizzy or light-headed.  If you are in pain, you may need to take or ask for pain medication before doing incentive spirometry. It is harder to take a deep breath if you are having  pain. AFTER USE  Rest and breathe slowly and easily.  It can be helpful to keep track of a log of your progress. Your caregiver can provide you with a simple table to help with this. If you are using the spirometer at home, follow these instructions: [name] IF:   You are having difficultly using the spirometer.  You have trouble using the spirometer as often as instructed.  Your pain medication is not giving enough relief while using the spirometer.  You develop fever of 100.5 F (38.1 C) or higher. SEEK IMMEDIATE MEDICAL CARE IF:   You cough up bloody sputum that had not been present before.  You develop fever of 102 F (38.9 C) or greater.  You develop worsening pain at or near the incision site. MAKE SURE YOU:   Understand these instructions.  Will watch your condition.  Will get help right away if you are not doing well or get worse. Document Released: 04/09/2007 Document Revised: 02/19/2012 Document Reviewed: 06/10/2007 ExitCare Patient Information 2014 Exit[employer], [place].   ________________________________________________________________________  WHAT IS A BLOOD TRANSFUSION? Blood Transfusion Information  A transfusion is the replacement of blood or some of its parts. Blood is made up of multiple cells which provide different functions.  Red blood cells carry oxygen and are used for blood loss replacement.  White blood cells fight against infection.  Platelets control bleeding.  Plasma helps clot blood.  Other blood products are available for specialized needs, such as hemophilia or other clotting disorders. BEFORE THE TRANSFUSION  Who gives blood for transfusions?   Healthy volunteers who are fully evaluated to make sure their blood is safe. This is blood bank blood. Transfusion therapy is the safest it has ever been in the practice of medicine. Before blood is taken from a donor, a complete history is taken to make sure that person has no history  of diseases nor engages in risky social behavior (examples are intravenous drug use or sexual activity with multiple partners). The donor's travel history is screened to minimize risk of transmitting infections, such as malaria. The donated blood is tested for signs of infectious diseases, such as HIV and hepatitis. The blood is then tested to be sure it is compatible with you in order to minimize the chance of a transfusion reaction. If you or a relative donates blood, this is often done in anticipation of surgery and is not appropriate for emergency situations. It takes many days to process the donated blood. RISKS AND COMPLICATIONS Although transfusion therapy is very safe and saves many lives, the main dangers of transfusion include:   Getting an infectious disease.  Developing a transfusion reaction. This is an allergic reaction to something in the blood you were given. Every precaution is taken to prevent this. The decision to have a blood transfusion has been considered carefully by your caregiver before blood is given. Blood is not given unless the benefits outweigh the risks. AFTER THE TRANSFUSION  Right after receiving a blood transfusion, you will usually feel much better and more energetic. This is especially true if your red blood cells have gotten low (anemic). The transfusion raises the level of the red blood cells which carry oxygen, and this usually causes an energy increase.  The nurse administering the transfusion will  monitor you carefully for complications. HOME CARE INSTRUCTIONS  No special instructions are needed after a transfusion. You may find your energy is better. Speak with your caregiver about any limitations on activity for underlying diseases you may have. SEEK MEDICAL CARE IF:   Your condition is not improving after your transfusion.  You develop redness or irritation at the intravenous (IV) site. SEEK IMMEDIATE MEDICAL CARE IF:  Any of the following symptoms  occur over the next 12 hours:  Shaking chills.  You have a temperature by mouth above 102 F (38.9 C), not controlled by medicine.  Chest, back, or muscle pain.  People around you feel you are not acting correctly or are confused.  Shortness of breath or difficulty breathing.  Dizziness and fainting.  You get a rash or develop hives.  You have a decrease in urine output.  Your urine turns a dark color or changes to pink, red, or brown. Any of the following symptoms occur over the next 10 days:  You have a temperature by mouth above 102 F (38.9 C), not controlled by medicine.  Shortness of breath.  Weakness after normal activity.  The white part of the eye turns yellow (jaundice).  You have a decrease in the amount of urine or are urinating less often.  Your urine turns a dark color or changes to pink, red, or brown. Document Released: 11/24/2000 Document Revised: 02/19/2012 Document Reviewed: 07/13/2008 [facility] Patient Information 2014 [place], [place].  _______________________________________________________________________

## 2016-12-08 NOTE — Progress Notes (Signed)
08/15/2016 cardio office visit  Dr berry in [facility] 08/15/2016 EKG EPIC 10/31/2016 Medical clearance cardio Dr Gwenlyn Found 12/21/ CMP in chart 12/21 CBC w/diff in chart

## 2016-12-13 ENCOUNTER — Encounter (HOSPITAL_COMMUNITY): Payer: Self-pay

## 2016-12-13 ENCOUNTER — Encounter (HOSPITAL_COMMUNITY)
Admission: RE | Admit: 2016-12-13 | Discharge: 2016-12-13 | Disposition: A | Discharge status: Home / Self Care | Payer: Non-veteran care | Source: Ambulatory Visit | Attending: Orthopedic Surgery | Admitting: Orthopedic Surgery

## 2016-12-13 DIAGNOSIS — M25552 Pain in left hip: Secondary | ICD-10-CM | POA: Diagnosis not present

## 2016-12-13 DIAGNOSIS — Z01812 Encounter for preprocedural laboratory examination: Secondary | ICD-10-CM | POA: Diagnosis present

## 2016-12-13 DIAGNOSIS — M1612 Unilateral primary osteoarthritis, left hip: Secondary | ICD-10-CM | POA: Diagnosis not present

## 2016-12-13 HISTORY — DX: Malignant (primary) neoplasm, unspecified: C80.1

## 2016-12-13 LAB — SURGICAL PCR SCREEN
MRSA, PCR: NEGATIVE
Staphylococcus aureus: NEGATIVE

## 2016-12-13 NOTE — Progress Notes (Signed)
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

## 2016-12-18 MED ORDER — DEXTROSE 5 % IV SOLN
3.0000 g | INTRAVENOUS | Status: AC
  Administered 2016-12-19: 3 g via INTRAVENOUS
  Filled 2016-12-18: qty 3

## 2016-12-19 ENCOUNTER — Inpatient Hospital Stay (HOSPITAL_COMMUNITY)
Admission: RE | Admit: 2016-12-19 | Discharge: 2016-12-21 | DRG: 470 | Disposition: A | Discharge status: Home / Self Care | Payer: Non-veteran care | Source: Ambulatory Visit | Attending: Orthopedic Surgery | Admitting: Orthopedic Surgery

## 2016-12-19 ENCOUNTER — Inpatient Hospital Stay (HOSPITAL_COMMUNITY): Payer: Non-veteran care

## 2016-12-19 ENCOUNTER — Encounter (HOSPITAL_COMMUNITY)
Admission: RE | Disposition: A | Discharge status: Home / Self Care | Payer: Self-pay | Source: Ambulatory Visit | Attending: Orthopedic Surgery

## 2016-12-19 ENCOUNTER — Inpatient Hospital Stay (HOSPITAL_COMMUNITY): Payer: Non-veteran care | Admitting: Anesthesiology

## 2016-12-19 ENCOUNTER — Encounter (HOSPITAL_COMMUNITY): Payer: Self-pay | Admitting: *Deleted

## 2016-12-19 DIAGNOSIS — Z8619 Personal history of other infectious and parasitic diseases: Secondary | ICD-10-CM | POA: Diagnosis not present

## 2016-12-19 DIAGNOSIS — Z96652 Presence of left artificial knee joint: Secondary | ICD-10-CM | POA: Diagnosis present

## 2016-12-19 DIAGNOSIS — Z888 Allergy status to other drugs, medicaments and biological substances status: Secondary | ICD-10-CM

## 2016-12-19 DIAGNOSIS — M1612 Unilateral primary osteoarthritis, left hip: Secondary | ICD-10-CM | POA: Diagnosis present

## 2016-12-19 DIAGNOSIS — G4733 Obstructive sleep apnea (adult) (pediatric): Secondary | ICD-10-CM | POA: Diagnosis present

## 2016-12-19 DIAGNOSIS — Z8249 Family history of ischemic heart disease and other diseases of the circulatory system: Secondary | ICD-10-CM | POA: Diagnosis not present

## 2016-12-19 DIAGNOSIS — Z885 Allergy status to narcotic agent status: Secondary | ICD-10-CM

## 2016-12-19 DIAGNOSIS — Z96642 Presence of left artificial hip joint: Secondary | ICD-10-CM

## 2016-12-19 DIAGNOSIS — Z6841 Body Mass Index (BMI) 40.0 and over, adult: Secondary | ICD-10-CM

## 2016-12-19 DIAGNOSIS — I10 Essential (primary) hypertension: Secondary | ICD-10-CM | POA: Diagnosis present

## 2016-12-19 DIAGNOSIS — Z9989 Dependence on other enabling machines and devices: Secondary | ICD-10-CM

## 2016-12-19 DIAGNOSIS — Z96641 Presence of right artificial hip joint: Secondary | ICD-10-CM | POA: Diagnosis present

## 2016-12-19 HISTORY — PX: TOTAL HIP ARTHROPLASTY: SHX124

## 2016-12-19 LAB — TYPE AND SCREEN
ABO/RH(D): O POS
ANTIBODY SCREEN: NEGATIVE

## 2016-12-19 SURGERY — ARTHROPLASTY, HIP, TOTAL, ANTERIOR APPROACH
Anesthesia: Monitor Anesthesia Care | Site: Hip | Laterality: Left

## 2016-12-19 MED ORDER — PROPOFOL 10 MG/ML IV BOLUS
INTRAVENOUS | Status: AC
  Filled 2016-12-19: qty 20

## 2016-12-19 MED ORDER — FLUCONAZOLE 100 MG PO TABS
100.0000 mg | ORAL_TABLET | Freq: Every day | ORAL | Status: DC
  Administered 2016-12-19 – 2016-12-21 (×3): 100 mg via ORAL
  Filled 2016-12-19 (×4): qty 1

## 2016-12-19 MED ORDER — PRAVASTATIN SODIUM 40 MG PO TABS
40.0000 mg | ORAL_TABLET | Freq: Every day | ORAL | Status: DC
  Administered 2016-12-19 – 2016-12-20 (×2): 40 mg via ORAL
  Filled 2016-12-19: qty 1
  Filled 2016-12-19: qty 2
  Filled 2016-12-19 (×2): qty 1

## 2016-12-19 MED ORDER — PHENOL 1.4 % MT LIQD
1.0000 | OROMUCOSAL | Status: DC | PRN
  Filled 2016-12-19: qty 177

## 2016-12-19 MED ORDER — DEXAMETHASONE SODIUM PHOSPHATE 10 MG/ML IJ SOLN
10.0000 mg | Freq: Once | INTRAMUSCULAR | Status: AC
  Administered 2016-12-19: 10 mg via INTRAVENOUS

## 2016-12-19 MED ORDER — FENTANYL CITRATE (PF) 100 MCG/2ML IJ SOLN
25.0000 ug | INTRAMUSCULAR | Status: DC | PRN
  Administered 2016-12-19 (×2): 50 ug via INTRAVENOUS

## 2016-12-19 MED ORDER — NON FORMULARY: 40.0000 mg | Freq: Two times a day (BID) | Status: DC

## 2016-12-19 MED ORDER — DIPHENHYDRAMINE HCL 25 MG PO CAPS: 25.0000 mg | ORAL_CAPSULE | Freq: Four times a day (QID) | ORAL | Status: DC | PRN

## 2016-12-19 MED ORDER — ONDANSETRON HCL 4 MG/2ML IJ SOLN: 4.0000 mg | Freq: Once | INTRAMUSCULAR | Status: DC | PRN

## 2016-12-19 MED ORDER — ONDANSETRON HCL 4 MG/2ML IJ SOLN
INTRAMUSCULAR | Status: DC | PRN
  Administered 2016-12-19: 4 mg via INTRAVENOUS

## 2016-12-19 MED ORDER — ONDANSETRON HCL 4 MG PO TABS: 4.0000 mg | ORAL_TABLET | Freq: Four times a day (QID) | ORAL | Status: DC | PRN

## 2016-12-19 MED ORDER — POLYETHYLENE GLYCOL 3350 17 G PO PACK
17.0000 g | PACK | Freq: Two times a day (BID) | ORAL | Status: DC
  Administered 2016-12-20 – 2016-12-21 (×2): 17 g via ORAL
  Filled 2016-12-19 (×2): qty 1

## 2016-12-19 MED ORDER — MIDAZOLAM HCL 2 MG/2ML IJ SOLN
INTRAMUSCULAR | Status: AC
  Filled 2016-12-19: qty 2

## 2016-12-19 MED ORDER — METHOCARBAMOL 1000 MG/10ML IJ SOLN
500.0000 mg | Freq: Four times a day (QID) | INTRAMUSCULAR | Status: DC | PRN
  Administered 2016-12-19: 500 mg via INTRAVENOUS
  Filled 2016-12-19: qty 550
  Filled 2016-12-19: qty 5

## 2016-12-19 MED ORDER — MORPHINE SULFATE (PF) 2 MG/ML IV SOLN
1.0000 mg | INTRAVENOUS | Status: DC | PRN
  Administered 2016-12-19 (×3): 1 mg via INTRAVENOUS
  Administered 2016-12-20 (×2): 2 mg via INTRAVENOUS
  Filled 2016-12-19 (×5): qty 1

## 2016-12-19 MED ORDER — PROPOFOL 10 MG/ML IV BOLUS
INTRAVENOUS | Status: AC
  Filled 2016-12-19: qty 40

## 2016-12-19 MED ORDER — FENTANYL CITRATE (PF) 100 MCG/2ML IJ SOLN
INTRAMUSCULAR | Status: AC
  Filled 2016-12-19: qty 2

## 2016-12-19 MED ORDER — LACTATED RINGERS IV SOLN
INTRAVENOUS | Status: DC | PRN
  Administered 2016-12-19 (×3): via INTRAVENOUS

## 2016-12-19 MED ORDER — LEVOTHYROXINE SODIUM 25 MCG PO TABS
25.0000 ug | ORAL_TABLET | Freq: Every day | ORAL | Status: DC
  Administered 2016-12-20 – 2016-12-21 (×2): 25 ug via ORAL
  Filled 2016-12-19 (×2): qty 1

## 2016-12-19 MED ORDER — DEXAMETHASONE SODIUM PHOSPHATE 10 MG/ML IJ SOLN
10.0000 mg | Freq: Once | INTRAMUSCULAR | Status: AC
  Administered 2016-12-20: 10 mg via INTRAVENOUS
  Filled 2016-12-19: qty 1

## 2016-12-19 MED ORDER — TRANEXAMIC ACID 1000 MG/10ML IV SOLN
1000.0000 mg | INTRAVENOUS | Status: AC
  Administered 2016-12-19: 1000 mg via INTRAVENOUS
  Filled 2016-12-19: qty 1100

## 2016-12-19 MED ORDER — METOCLOPRAMIDE HCL 5 MG PO TABS: 5.0000 mg | ORAL_TABLET | Freq: Three times a day (TID) | ORAL | Status: DC | PRN

## 2016-12-19 MED ORDER — OMEPRAZOLE 20 MG PO CPDR
40.0000 mg | DELAYED_RELEASE_CAPSULE | Freq: Two times a day (BID) | ORAL | Status: DC
  Administered 2016-12-19 – 2016-12-21 (×4): 40 mg via ORAL
  Filled 2016-12-19 (×4): qty 2

## 2016-12-19 MED ORDER — FERROUS SULFATE 325 (65 FE) MG PO TABS
325.0000 mg | ORAL_TABLET | Freq: Three times a day (TID) | ORAL | Status: DC
  Administered 2016-12-20 – 2016-12-21 (×5): 325 mg via ORAL
  Filled 2016-12-19 (×5): qty 1

## 2016-12-19 MED ORDER — METHOCARBAMOL 500 MG PO TABS
500.0000 mg | ORAL_TABLET | Freq: Four times a day (QID) | ORAL | Status: DC | PRN
  Administered 2016-12-20 – 2016-12-21 (×2): 500 mg via ORAL
  Filled 2016-12-19 (×2): qty 1

## 2016-12-19 MED ORDER — BUPIVACAINE IN DEXTROSE 0.75-8.25 % IT SOLN
INTRATHECAL | Status: DC | PRN
  Administered 2016-12-19: 2 mL via INTRATHECAL

## 2016-12-19 MED ORDER — FLUTICASONE FUROATE-VILANTEROL 100-25 MCG/INH IN AEPB
1.0000 | INHALATION_SPRAY | Freq: Every day | RESPIRATORY_TRACT | Status: DC
  Filled 2016-12-19: qty 28

## 2016-12-19 MED ORDER — ALUM & MAG HYDROXIDE-SIMETH 200-200-20 MG/5ML PO SUSP: 30.0000 mL | ORAL | Status: DC | PRN

## 2016-12-19 MED ORDER — DOCUSATE SODIUM 100 MG PO CAPS
100.0000 mg | ORAL_CAPSULE | Freq: Two times a day (BID) | ORAL | Status: DC
  Administered 2016-12-19 – 2016-12-21 (×5): 100 mg via ORAL
  Filled 2016-12-19 (×4): qty 1

## 2016-12-19 MED ORDER — MIDAZOLAM HCL 5 MG/5ML IJ SOLN
INTRAMUSCULAR | Status: DC | PRN
  Administered 2016-12-19: 1 mg via INTRAVENOUS
  Administered 2016-12-19: 2 mg via INTRAVENOUS

## 2016-12-19 MED ORDER — ASPIRIN 81 MG PO CHEW
81.0000 mg | CHEWABLE_TABLET | Freq: Two times a day (BID) | ORAL | Status: DC
  Administered 2016-12-19 – 2016-12-21 (×5): 81 mg via ORAL
  Filled 2016-12-19 (×4): qty 1

## 2016-12-19 MED ORDER — 0.9 % SODIUM CHLORIDE (POUR BTL) OPTIME
TOPICAL | Status: DC | PRN
Start: 2016-12-19 — End: 2016-12-19
  Administered 2016-12-19: 1000 mL

## 2016-12-19 MED ORDER — STERILE WATER FOR IRRIGATION IR SOLN
Status: DC | PRN
  Administered 2016-12-19: 2000 mL

## 2016-12-19 MED ORDER — SODIUM CHLORIDE 0.9 % IV SOLN
100.0000 mL/h | INTRAVENOUS | Status: DC
  Administered 2016-12-19: 100 mL/h via INTRAVENOUS
  Filled 2016-12-19 (×7): qty 1000

## 2016-12-19 MED ORDER — MENTHOL 3 MG MT LOZG: 1.0000 | LOZENGE | OROMUCOSAL | Status: DC | PRN

## 2016-12-19 MED ORDER — PROPOFOL 10 MG/ML IV BOLUS
INTRAVENOUS | Status: AC
  Filled 2016-12-19: qty 60

## 2016-12-19 MED ORDER — HYDROCHLOROTHIAZIDE 25 MG PO TABS
25.0000 mg | ORAL_TABLET | Freq: Every day | ORAL | Status: DC
  Administered 2016-12-19 – 2016-12-21 (×3): 25 mg via ORAL
  Filled 2016-12-19 (×3): qty 1

## 2016-12-19 MED ORDER — MAGNESIUM CITRATE PO SOLN: 1.0000 | Freq: Once | ORAL | Status: DC | PRN

## 2016-12-19 MED ORDER — PROPOFOL 500 MG/50ML IV EMUL
INTRAVENOUS | Status: DC | PRN
  Administered 2016-12-19: 150 ug/kg/min via INTRAVENOUS

## 2016-12-19 MED ORDER — LOSARTAN POTASSIUM 50 MG PO TABS
50.0000 mg | ORAL_TABLET | Freq: Every day | ORAL | Status: DC
  Administered 2016-12-19 – 2016-12-21 (×3): 50 mg via ORAL
  Filled 2016-12-19 (×3): qty 1

## 2016-12-19 MED ORDER — BISACODYL 10 MG RE SUPP: 10.0000 mg | Freq: Every day | RECTAL | Status: DC | PRN

## 2016-12-19 MED ORDER — METOCLOPRAMIDE HCL 5 MG/ML IJ SOLN: 5.0000 mg | Freq: Three times a day (TID) | INTRAMUSCULAR | Status: DC | PRN

## 2016-12-19 MED ORDER — LACTATED RINGERS IV SOLN
INTRAVENOUS | Status: DC
  Administered 2016-12-19: 1000 mL via INTRAVENOUS

## 2016-12-19 MED ORDER — DEXAMETHASONE SODIUM PHOSPHATE 10 MG/ML IJ SOLN
INTRAMUSCULAR | Status: AC
  Filled 2016-12-19: qty 1

## 2016-12-19 MED ORDER — FENTANYL CITRATE (PF) 100 MCG/2ML IJ SOLN
INTRAMUSCULAR | Status: DC | PRN
  Administered 2016-12-19: 100 ug via INTRAVENOUS

## 2016-12-19 MED ORDER — ALBUTEROL SULFATE (2.5 MG/3ML) 0.083% IN NEBU: 2.5000 mg | INHALATION_SOLUTION | Freq: Four times a day (QID) | RESPIRATORY_TRACT | Status: DC | PRN

## 2016-12-19 MED ORDER — CELECOXIB 200 MG PO CAPS
200.0000 mg | ORAL_CAPSULE | Freq: Two times a day (BID) | ORAL | Status: DC
  Administered 2016-12-19 – 2016-12-21 (×4): 200 mg via ORAL
  Filled 2016-12-19 (×4): qty 1

## 2016-12-19 MED ORDER — ONDANSETRON HCL 4 MG/2ML IJ SOLN: 4.0000 mg | Freq: Four times a day (QID) | INTRAMUSCULAR | Status: DC | PRN

## 2016-12-19 MED ORDER — CEFAZOLIN SODIUM-DEXTROSE 2-4 GM/100ML-% IV SOLN
2.0000 g | Freq: Four times a day (QID) | INTRAVENOUS | Status: AC
  Administered 2016-12-19 – 2016-12-20 (×2): 2 g via INTRAVENOUS
  Filled 2016-12-19 (×2): qty 100

## 2016-12-19 MED ORDER — HYDROCODONE-ACETAMINOPHEN 7.5-325 MG PO TABS
1.0000 | ORAL_TABLET | ORAL | Status: DC
  Administered 2016-12-19 – 2016-12-21 (×12): 2 via ORAL
  Filled 2016-12-19 (×12): qty 2

## 2016-12-19 MED ORDER — ONDANSETRON HCL 4 MG/2ML IJ SOLN
INTRAMUSCULAR | Status: AC
  Filled 2016-12-19: qty 2

## 2016-12-19 SURGICAL SUPPLY — 37 items
ADH SKN CLS APL DERMABOND .7 (GAUZE/BANDAGES/DRESSINGS) ×1
BAG DECANTER FOR FLEXI CONT (MISCELLANEOUS) IMPLANT
BAG SPEC THK2 15X12 ZIP CLS (MISCELLANEOUS)
BAG ZIPLOCK 12X15 (MISCELLANEOUS) IMPLANT
BLADE SAG 18X100X1.27 (BLADE) ×2 IMPLANT
CAPT HIP TOTAL 2 ×1 IMPLANT
CLOTH BEACON ORANGE TIMEOUT ST (SAFETY) ×2 IMPLANT
COVER PERINEAL POST (MISCELLANEOUS) ×2 IMPLANT
DERMABOND ADVANCED (GAUZE/BANDAGES/DRESSINGS) ×1
DERMABOND ADVANCED .7 DNX12 (GAUZE/BANDAGES/DRESSINGS) ×1 IMPLANT
DRAPE STERI IOBAN 125X83 (DRAPES) ×2 IMPLANT
DRAPE U-SHAPE 47X51 STRL (DRAPES) ×4 IMPLANT
DRESSING AQUACEL AG SP 3.5X10 (GAUZE/BANDAGES/DRESSINGS) ×1 IMPLANT
DRSG AQUACEL AG SP 3.5X10 (GAUZE/BANDAGES/DRESSINGS) ×2
DURAPREP 26ML APPLICATOR (WOUND CARE) ×2 IMPLANT
ELECT REM PT RETURN 15FT ADLT (MISCELLANEOUS) IMPLANT
ELECT REM PT RETURN 9FT ADLT (ELECTROSURGICAL) ×2
ELECTRODE REM PT RTRN 9FT ADLT (ELECTROSURGICAL) ×1 IMPLANT
GLOVE BIOGEL M STRL SZ7.5 (GLOVE) ×1 IMPLANT
GLOVE BIOGEL PI IND STRL 7.5 (GLOVE) ×1 IMPLANT
GLOVE BIOGEL PI IND STRL 8.5 (GLOVE) ×1 IMPLANT
GLOVE BIOGEL PI INDICATOR 7.5 (GLOVE) ×1
GLOVE BIOGEL PI INDICATOR 8.5 (GLOVE)
GLOVE ECLIPSE 8.0 STRL XLNG CF (GLOVE) ×2 IMPLANT
GLOVE ORTHO TXT STRL SZ7.5 (GLOVE) ×2 IMPLANT
GOWN STRL REUS W/TWL LRG LVL3 (GOWN DISPOSABLE) ×2 IMPLANT
GOWN STRL REUS W/TWL XL LVL3 (GOWN DISPOSABLE) ×2 IMPLANT
HOLDER FOLEY CATH W/STRAP (MISCELLANEOUS) ×2 IMPLANT
PACK ANTERIOR HIP CUSTOM (KITS) ×2 IMPLANT
SUT MNCRL AB 4-0 PS2 18 (SUTURE) ×2 IMPLANT
SUT VIC AB 1 CT1 36 (SUTURE) ×6 IMPLANT
SUT VIC AB 2-0 CT1 27 (SUTURE) ×6
SUT VIC AB 2-0 CT1 TAPERPNT 27 (SUTURE) ×2 IMPLANT
SUT VLOC 180 0 24IN GS25 (SUTURE) ×2 IMPLANT
TRAY FOLEY W/METER SILVER 16FR (SET/KITS/TRAYS/PACK) ×1 IMPLANT
WATER STERILE IRR 1500ML POUR (IV SOLUTION) ×2 IMPLANT
YANKAUER SUCT BULB TIP 10FT TU (MISCELLANEOUS) ×1 IMPLANT

## 2016-12-19 NOTE — Anesthesia Postprocedure Evaluation (Signed)
Anesthesia Post Note  Patient: Samuel [facility]  Procedure(s) Performed: Procedure(s) (LRB): LEFT TOTAL HIP ARTHROPLASTY ANTERIOR APPROACH (Left)  Patient location during evaluation: PACU Anesthesia Type: MAC and General Level of consciousness: awake, awake and alert and oriented Pain management: pain level controlled Vital Signs Assessment: post-procedure vital signs reviewed and stable Respiratory status: spontaneous breathing, nonlabored ventilation and respiratory function stable Cardiovascular status: blood pressure returned to baseline Anesthetic complications: no       Last Vitals:  Vitals:   12/19/16 1500 12/19/16 1514  BP: (!) 135/98 117/73  Pulse: 66 70  Resp: 12 14  Temp: 36.4 C 36.3 C    Last Pain:  Vitals:   12/19/16 1521  TempSrc:   PainSc: 2                  Agnes Probert COKER

## 2016-12-19 NOTE — Transfer of Care (Signed)
Immediate Anesthesia Transfer of Care Note  Patient: [facility]  Procedure(s) Performed: Procedure(s): LEFT TOTAL HIP ARTHROPLASTY ANTERIOR APPROACH (Left)  Patient Location: PACU  Anesthesia Type:Spinal  Level of Consciousness:  sedated, patient cooperative and responds to stimulation  Airway & Oxygen Therapy:Patient Spontanous Breathing and Patient connected to face mask oxgen  Post-op Assessment:  Report given to PACU RN and Post -op Vital signs reviewed and stable  Post vital signs:  Reviewed and stable  Last Vitals:  Vitals:   12/19/16 0901 12/19/16 1334  BP: (!) 141/90 102/68  Pulse: 80 95  Resp: 18   Temp: 123XX123 C     Complications: No apparent anesthesia complications

## 2016-12-19 NOTE — Anesthesia Procedure Notes (Addendum)
Spinal  Patient location during procedure: OR End time: 12/19/2016 11:31 AM Staffing Resident/CRNA: Enrigue Catena E Performed: anesthesiologist  Preanesthetic Checklist Completed: patient identified, site marked, surgical consent, pre-op evaluation, timeout performed, IV checked, risks and benefits discussed and monitors and equipment checked Spinal Block Patient position: sitting Prep: DuraPrep Patient monitoring: heart rate, continuous pulse ox and blood pressure Location: L3-4 Injection technique: single-shot Needle Needle type: Pencan  Needle gauge: 24 G Needle length: 9 cm Assessment Sensory level: T4 Additional Notes Expiration date of kit checked and confirmed. Patient tolerated procedure well, without complications.

## 2016-12-19 NOTE — Op Note (Signed)
NAME:  Samuel Goodwin                ACCOUNT NO.: [id number]      MEDICAL RECORD NO.: [id number]      FACILITY:  [facility]      PHYSICIAN:  Paralee Cancel D  DATE OF BIRTH:  06/20/1961     DATE OF PROCEDURE:  12/19/2016                                 OPERATIVE REPORT         PREOPERATIVE DIAGNOSIS: Left  hip osteoarthritis.      POSTOPERATIVE DIAGNOSIS:  Left hip osteoarthritis.      PROCEDURE:  Left total hip replacement through an anterior approach   utilizing DePuy THR system, component size 49mm pinnacle cup, a size 36+4 neutral   Altrex liner, a size 9 Hi Tri Lock stem with a 36+5 delta ceramic   ball.      SURGEON:  Pietro Cassis. Alvan Dame, M.D.      ASSISTANT:  Nehemiah Massed, PA-C     ANESTHESIA:  Spinal.      SPECIMENS:  None.      COMPLICATIONS:  None.      BLOOD LOSS:  750 cc     DRAINS:  None.      INDICATION OF THE PROCEDURE:  Samuel Goodwin is a 56 y.o. male who had   presented to office for evaluation of left hip pain.  Radiographs revealed   progressive degenerative changes with bone-on-bone   articulation to the  hip joint.  The patient had painful limited range of   motion significantly affecting their overall quality of life.  The patient was failing to    respond to conservative measures, and at this point was ready   to proceed with more definitive measures.  The patient has noted progressive   degenerative changes in his hip, progressive problems and dysfunction   with regarding the hip prior to surgery.  Consent was obtained for   benefit of pain relief.  Specific risk of infection, DVT, component   failure, dislocation, need for revision surgery, as well discussion of   the anterior versus posterior approach were reviewed.  Consent was   obtained for benefit of anterior pain relief through an anterior   approach.      PROCEDURE IN DETAIL:  The patient was brought to operative theater.   Once adequate anesthesia, preoperative  antibiotics, 3gm of Ancef, 1 gm of Tranexamic Acid, and 10 mg of Decadron administered.   The patient was positioned supine on the OSI Hanna table.  Once adequate   padding of boney process was carried out, we had predraped out the hip, and  used fluoroscopy to confirm orientation of the pelvis and position.      The left hip was then prepped and draped from proximal iliac crest to   mid thigh with shower curtain technique.      Time-out was performed identifying the patient, planned procedure, and   extremity.     An incision was then made 2 cm distal and lateral to the   anterior superior iliac spine extending over the orientation of the   tensor fascia lata muscle and sharp dissection was carried down to the   fascia of the muscle and protractor placed in the soft tissues.      The fascia was  then incised.  The muscle belly was identified and swept   laterally and retractor placed along the superior neck.  Following   cauterization of the circumflex vessels and removing some pericapsular   fat, a second cobra retractor was placed on the inferior neck.  A third   retractor was placed on the anterior acetabulum after elevating the   anterior rectus.  A L-capsulotomy was along the line of the   superior neck to the trochanteric fossa, then extended proximally and   distally.  Tag sutures were placed and the retractors were then placed   intracapsular.  We then identified the trochanteric fossa and   orientation of my neck cut, confirmed this radiographically   and then made a neck osteotomy with the femur on traction.  The femoral   head was removed without difficulty or complication.  Traction was let   off and retractors were placed posterior and anterior around the   acetabulum.      The labrum and foveal tissue were debrided.  I began reaming with a 76mm   reamer and reamed up to 33m reamer with good bony bed preparation and a 60mm   cup was chosen.  The final 30mm Pinnacle cup  was then impacted under fluoroscopy  to confirm the depth of penetration and orientation with respect to   abduction.  A screw was placed followed by the hole eliminator.  The final   36+4 neutral Altrex liner was impacted with good visualized rim fit.  The cup was positioned anatomically within the acetabular portion of the pelvis.      At this point, the femur was rolled at 80 degrees.  Further capsule was   released off the inferior aspect of the femoral neck.  I then   released the superior capsule proximally.  The hook was placed laterally   along the femur and elevated manually and held in position with the bed   hook.  The leg was then extended and adducted with the leg rolled to 100   degrees of external rotation.  Once the proximal femur was fully   exposed, I used a box osteotome to set orientation.  I then began   broaching with the starting chili pepper broach and passed this by hand and then broached up to 9.  With the 9 broach in place I chose a high offset neck and did several trial reductions.  The offset was appropriate, leg lengths   appeared to be equal best matched with the +5 head ball confirmed radiographically.   Given these findings, I went ahead and dislocated the hip, repositioned all   retractors and positioned the right hip in the extended and abducted position.  The final 9 Hi Tri Lock stem was   chosen and it was impacted down to the level of neck cut.  Based on this   and the trial reduction, a 36+5 delta ceramic ball was chosen and   impacted onto a clean and dry trunnion, and the hip was reduced.  The   hip had been irrigated throughout the case again at this point.  I did   reapproximate the superior capsular leaflet to the anterior leaflet   using #1 Vicryl.  The fascia of the   tensor fascia lata muscle was then reapproximated using #1 Vicryl and #0 V-lock sutures.  The   remaining wound was closed with 2-0 Vicryl and running 4-0 Monocryl.   The hip was  cleaned, dried, and  dressed sterilely using Dermabond and   Aquacel dressing.  He was then brought   to recovery room in stable condition tolerating the procedure well.    Nehemiah Massed, PA-C was present for the entirety of the case involved from   preoperative positioning, perioperative retractor management, general   facilitation of the case, as well as primary wound closure as assistant.            Pietro Cassis Alvan Dame, M.D.        12/19/2016 12:59 PM

## 2016-12-19 NOTE — Interval H&P Note (Signed)
History and Physical Interval Note:  12/19/2016 10:06 AM  [facility]  has presented today for surgery, with the diagnosis of left hip osteoarthritis  The various methods of treatment have been discussed with the patient and family. After consideration of risks, benefits and other options for treatment, the patient has consented to  Procedure(s): LEFT TOTAL HIP ARTHROPLASTY ANTERIOR APPROACH (Left) as a surgical intervention .  The patient's history has been reviewed, patient examined, no change in status, stable for surgery.  I have reviewed the patient's chart and labs.  Questions were answered to the patient's satisfaction.     [name]

## 2016-12-19 NOTE — Discharge Instructions (Signed)

## 2016-12-19 NOTE — Anesthesia Preprocedure Evaluation (Addendum)
Anesthesia Evaluation  Patient identified by MRN, date of birth, ID band Patient awake    Reviewed: Allergy & Precautions, NPO status , Patient's Chart, lab work & pertinent test results  Airway Mallampati: III  TM Distance: >3 FB Neck ROM: Full    Dental  (+) Teeth Intact, Dental Advisory Given   Pulmonary    breath sounds clear to auscultation       Cardiovascular hypertension,  Rhythm:Regular Rate:Normal     Neuro/Psych    GI/Hepatic   Endo/Other    Renal/GU      Musculoskeletal   Abdominal (+) + obese,   Peds  Hematology   Anesthesia Other Findings   Reproductive/Obstetrics                             Anesthesia Physical Anesthesia Plan  ASA: III  Anesthesia Plan: MAC and Spinal   Post-op Pain Management:    Induction: Intravenous  Airway Management Planned: Natural Airway and Simple Face Mask  Additional Equipment:   Intra-op Plan:   Post-operative Plan:   Informed Consent: I have reviewed the patients History and Physical, chart, labs and discussed the procedure including the risks, benefits and alternatives for the proposed anesthesia with the patient or authorized representative who has indicated his/her understanding and acceptance.   Dental advisory given  Plan Discussed with: CRNA and Anesthesiologist  Anesthesia Plan Comments:         Anesthesia Quick Evaluation

## 2016-12-20 HISTORY — DX: Morbid (severe) obesity due to excess calories: E66.01

## 2016-12-20 LAB — BASIC METABOLIC PANEL
Anion gap: 7 (ref 5–15)
BUN: 13 mg/dL (ref 6–20)
CO2: 25 mmol/L (ref 22–32)
Calcium: 7.9 mg/dL — ABNORMAL LOW (ref 8.9–10.3)
Chloride: 103 mmol/L (ref 101–111)
Creatinine, Ser: 0.69 mg/dL (ref 0.61–1.24)
GFR calc Af Amer: >60 mL/min (ref >60)
GFR calc non Af Amer: >60 mL/min (ref >60)
Glucose, Bld: 163 mg/dL — ABNORMAL HIGH (ref 65–99)
POTASSIUM: 4 mmol/L (ref 3.5–5.1)
SODIUM: 135 mmol/L (ref 135–145)

## 2016-12-20 LAB — CBC
HEMATOCRIT: 32.3 % — AB (ref 39.0–52.0)
HEMOGLOBIN: 11 g/dL — AB (ref 13.0–17.0)
MCH: 28.8 pg (ref 26.0–34.0)
MCHC: 34.1 g/dL (ref 30.0–36.0)
MCV: 84.6 fL (ref 78.0–100.0)
Platelets: 206 10*3/uL (ref 150–400)
RBC: 3.82 MIL/uL — AB (ref 4.22–5.81)
RDW: 13.6 % (ref 11.5–15.5)
WBC: 12.1 10*3/uL — ABNORMAL HIGH (ref 4.0–10.5)

## 2016-12-20 MED ORDER — METHOCARBAMOL 500 MG PO TABS: 500.0000 mg | ORAL_TABLET | Freq: Four times a day (QID) | ORAL | 0 refills | Status: DC | PRN

## 2016-12-20 MED ORDER — FERROUS SULFATE 325 (65 FE) MG PO TABS: 325.0000 mg | ORAL_TABLET | Freq: Three times a day (TID) | ORAL | 3 refills | Status: DC

## 2016-12-20 MED ORDER — ASPIRIN 81 MG PO CHEW: 81.0000 mg | CHEWABLE_TABLET | Freq: Two times a day (BID) | ORAL | 0 refills | Status: AC

## 2016-12-20 MED ORDER — DOCUSATE SODIUM 100 MG PO CAPS: 100.0000 mg | ORAL_CAPSULE | Freq: Two times a day (BID) | ORAL | 0 refills | Status: DC

## 2016-12-20 MED ORDER — POLYETHYLENE GLYCOL 3350 17 G PO PACK: 17.0000 g | PACK | Freq: Two times a day (BID) | ORAL | 0 refills | Status: DC

## 2016-12-20 MED ORDER — HYDROCODONE-ACETAMINOPHEN 7.5-325 MG PO TABS: 1.0000 | ORAL_TABLET | ORAL | 0 refills | Status: DC | PRN

## 2016-12-20 NOTE — Progress Notes (Signed)
     Subjective: 1 Day Post-Op Procedure(s) (LRB): LEFT TOTAL HIP ARTHROPLASTY ANTERIOR APPROACH (Left)   Patient reports pain as moderate, pain controlled. Issues with trying to control the pain throughout the night.  Some concern with regards to previous dislocation of the other hip after a replacement.  Patient would benefit from another day as well as more PT to strengthen as well as build confidence for the patient with regards to using the left leg.  Objective:   VITALS:   Vitals:   12/20/16 0252 12/20/16 0656  BP: (!) 114/92 108/62  Pulse: 95 76  Resp: 16 17  Temp: 97.6 F (36.4 C) 97.7 F (36.5 C)    Dorsiflexion/Plantar flexion intact Incision: dressing C/D/I No cellulitis present Compartment soft  LABS  Recent Labs  12/20/16 0451  HGB 11.0*  HCT 32.3*  WBC 12.1*  PLT 206     Recent Labs  12/20/16 0451  NA 135  K 4.0  BUN 13  CREATININE 0.69  GLUCOSE 163*     Assessment/Plan: 1 Day Post-Op Procedure(s) (LRB): LEFT TOTAL HIP ARTHROPLASTY ANTERIOR APPROACH (Left) Foley cath d/c'ed Advance diet Up with therapy D/C IV fluids Discharge home, when ready  Morbid Obesity (BMI >40)  Estimated body mass index is 41.47 kg/m as calculated from the following:   Height as of this encounter: 5\' 10"  (1.778 m).   Weight as of this encounter: 131.1 kg (289 lb). Patient also counseled that weight may inhibit the healing process Patient counseled that losing weight will help with future health issues       Samuel [facility]win   PAC  12/20/2016, 9:43 AM

## 2016-12-20 NOTE — Progress Notes (Signed)
   12/20/16 1500  PT Visit Information  Last PT Received On 12/20/16  Assistance Needed +1  History of Present Illness Pt is a 56 year old male s/p L DA THA with hx of R DA THA which dislocated and required revision of femoral component 02/07/16, L TKA  Subjective Data  Subjective Pt ambulated good distance in hallway and performed a couple standing exercises.  Pt plans to d/c home tomorrow.  Precautions  Precautions Fall  Restrictions  Other Position/Activity Restrictions WBAT  Pain Assessment  Pain Assessment 0-10  Pain Score 3  Pain Location L hip  Pain Descriptors / Indicators Aching;Sore  Pain Intervention(s) Limited activity within patient's tolerance;Monitored during session;Ice applied  Cognition  Arousal/Alertness Awake/alert  Behavior During Therapy WFL for tasks assessed/performed  Overall Cognitive Status Within Functional Limits for tasks assessed  Bed Mobility  Overal bed mobility Needs Assistance  Bed Mobility Supine to Sit;Sit to Supine  Supine to sit Min guard  Sit to supine Mod assist  General bed mobility comments verbal cues for safe technique; assist for both LEs onto bed  Transfers  Overall transfer level Needs assistance  Equipment used Rolling walker (2 wheeled)  Transfers Sit to/from Stand  Sit to Stand Min guard  General transfer comment verbal cues for UE and LE positioning  Ambulation/Gait  Ambulation/Gait assistance Min guard  Ambulation Distance (Feet) 360 Feet  Assistive device Rolling walker (2 wheeled)  Gait Pattern/deviations Step-to pattern;Step-through pattern;Decreased stance time - left  General Gait Details verbal cues for sequence, RW positioning, step length  Exercises  Exercises Total Joint  Total Joint Exercises  Hip ABduction/ADduction AROM;10 reps;Left;Standing  Knee Flexion AROM;10 reps;Standing;Left  Marching in Standing AROM;Left;10 reps;Standing  PT - End of Session  Activity Tolerance Patient tolerated treatment well   Patient left in bed;with call bell/phone within reach;with family/visitor present  PT - Assessment/Plan  PT Plan Current plan remains appropriate  PT Frequency (ACUTE ONLY) 7X/week  Follow Up Recommendations Outpatient PT  PT equipment None recommended by PT  PT Goal Progression  Progress towards PT goals Progressing toward goals  PT Time Calculation  PT Start Time (ACUTE ONLY) 1355  PT Stop Time (ACUTE ONLY) 1419  PT Time Calculation (min) (ACUTE ONLY) 24 min  PT General Charges  $$ ACUTE PT VISIT 1 Procedure  PT Treatments  $Gait Training 8-22 mins   Carmelia Bake, PT, DPT 12/20/2016 Pager: [phone]

## 2016-12-20 NOTE — Evaluation (Signed)
Physical Therapy Evaluation Patient Details Name: Samuel Goodwin MRN: [id number] DOB: 1961-03-20 Today's Date: 12/20/2016   History of Present Illness  Pt is a 56 year old male s/p L DA THA with hx of R DA THA which dislocated and required revision of femoral component 02/07/16, L TKA  Clinical Impression  Pt is s/p THA resulting in the deficits listed below (see PT Problem List).  Pt will benefit from skilled PT to increase their independence and safety with mobility to allow discharge to the venue listed below.  Pt assisted with ambulating in hallway and plans to d/c home with family support.     Follow Up Recommendations Outpatient PT    Equipment Recommendations  None recommended by PT    Recommendations for Other Services       Precautions / Restrictions Precautions Precautions: Fall Restrictions Weight Bearing Restrictions: No Other Position/Activity Restrictions: WBAT      Mobility  Bed Mobility Overal bed mobility: Needs Assistance Bed Mobility: Supine to Sit     Supine to sit: Min guard;HOB elevated     General bed mobility comments: verbal cues for safe technique  Transfers Overall transfer level: Needs assistance Equipment used: Rolling walker (2 wheeled) Transfers: Sit to/from Stand Sit to Stand: Min guard;Min assist         General transfer comment: verbal cues for UE and LE positioning  Ambulation/Gait Ambulation/Gait assistance: Min guard Ambulation Distance (Feet): 160 Feet Assistive device: Rolling walker (2 wheeled) Gait Pattern/deviations: Step-to pattern;Step-through pattern;Decreased stance time - left     General Gait Details: verbal cues for sequence, RW positioning, step length  Stairs            Wheelchair Mobility    Modified Rankin (Stroke Patients Only)       Balance                                             Pertinent Vitals/Pain Pain Assessment: 0-10 Pain Score: 2  Pain Location: L  hip Pain Descriptors / Indicators: Aching;Sore Pain Intervention(s): Limited activity within patient's tolerance;Monitored during session;Repositioned;Ice applied    Home Living Family/patient expects to be discharged to:: Private residence Living Arrangements: Children;Spouse/significant other Available Help at Discharge: Family Type of Home: House Home Access: Stairs to enter Entrance Stairs-Rails: Psychiatric nurse of Steps: 1 Home Layout: Able to live on main level with bedroom/bathroom Home Equipment: Walker - 2 wheels;Cane - single point;Bedside commode;Shower seat - built in      Prior Function Level of Independence: Independent               Journalist, newspaper        Extremity/Trunk Assessment        Lower Extremity Assessment Lower Extremity Assessment: LLE deficits/detail LLE Deficits / Details: pt reports L knee decreased flexion s/p TKA - limited to 90*, anticipated post op hip weakness    Cervical / Trunk Assessment Cervical / Trunk Assessment: Normal  Communication   Communication: No difficulties  Cognition Arousal/Alertness: Awake/alert Behavior During Therapy: WFL for tasks assessed/performed Overall Cognitive Status: Within Functional Limits for tasks assessed                      General Comments      Exercises     Assessment/Plan    PT Assessment Patient needs continued PT services  PT Problem List Decreased strength;Decreased range of motion;Decreased mobility;Pain          PT Treatment Interventions Stair training;Gait training;DME instruction;Therapeutic activities;Therapeutic exercise;Patient/family education;Functional mobility training    PT Goals (Current goals can be found in the Care Plan section)  Acute Rehab PT Goals PT Goal Formulation: With patient Time For Goal Achievement: 12/23/16 Potential to Achieve Goals: Good    Frequency 7X/week   Barriers to discharge        Co-evaluation                End of Session Equipment Utilized During Treatment: Gait belt Activity Tolerance: Patient tolerated treatment well Patient left: in chair;with call bell/phone within reach;with family/visitor present           Time: [id number] PT Time Calculation (min) (ACUTE ONLY): 22 min   Charges:   PT Evaluation $PT Eval Low Complexity: 1 Procedure     PT G Codes:        Anneth Brunell,KATHrine E 12/20/2016, 12:10 PM Carmelia Bake, PT, DPT 12/20/2016 Pager: [phone]

## 2016-12-20 NOTE — Care Management Note (Signed)
Case Management Note  Patient Details  Name: CICERO NOY MRN: [id number] Date of Birth: 1961-11-17  Subjective/Objective:                  LEFT TOTAL HIP ARTHROPLASTY ANTERIOR APPROACH (Left)  Action/Plan: Discharge planning Expected Discharge Date:                  Expected Discharge Plan:  Home/Self Care  In-House Referral:     Discharge planning Services  CM Consult  Post Acute Care Choice:  NA Choice offered to:  Patient  DME Arranged:  N/A DME Agency:  NA  HH Arranged:  NA HH Agency:  NA  Status of Service:  Completed, signed off  If discussed at [place] of Stay Meetings, dates discussed:    Additional Comments: CM met with pt in room to confirm plan is for outpt PT; pt confirms. Pt also states he has all the DME needed at home. No other CM needs were communicated. Dellie Catholic, RN 12/20/2016, 12:12 PM

## 2016-12-20 NOTE — Progress Notes (Signed)
OT Cancellation Note  Patient Details Name: Samuel Goodwin MRN: [id number] DOB: 01-22-1961   Cancelled Treatment:    Reason Eval/Treat Not Completed: OT screened, no needs identified, will sign off  Danea Manter 12/20/2016, 12:55 PM  Lesle Chris, OTR/L [id number] 12/20/2016

## 2016-12-20 NOTE — Progress Notes (Signed)
Patient has his home CPAP bedside and ready for use.  He said he would place on when ready on bed and did not need assistance.

## 2016-12-21 LAB — CBC
HEMATOCRIT: 31.7 % — AB (ref 39.0–52.0)
Hemoglobin: 10.8 g/dL — ABNORMAL LOW (ref 13.0–17.0)
MCH: 29 pg (ref 26.0–34.0)
MCHC: 34.1 g/dL (ref 30.0–36.0)
MCV: 85 fL (ref 78.0–100.0)
PLATELETS: 204 10*3/uL (ref 150–400)
RBC: 3.73 MIL/uL — ABNORMAL LOW (ref 4.22–5.81)
RDW: 13.9 % (ref 11.5–15.5)
WBC: 15.5 10*3/uL — ABNORMAL HIGH (ref 4.0–10.5)

## 2016-12-21 LAB — BASIC METABOLIC PANEL
ANION GAP: 6 (ref 5–15)
BUN: 19 mg/dL (ref 6–20)
CALCIUM: 8.3 mg/dL — AB (ref 8.9–10.3)
CO2: 30 mmol/L (ref 22–32)
Chloride: 100 mmol/L — ABNORMAL LOW (ref 101–111)
Creatinine, Ser: 0.75 mg/dL (ref 0.61–1.24)
GFR calc Af Amer: >60 mL/min (ref >60)
GLUCOSE: 131 mg/dL — AB (ref 65–99)
POTASSIUM: 4.1 mmol/L (ref 3.5–5.1)
Sodium: 136 mmol/L (ref 135–145)

## 2016-12-21 NOTE — Progress Notes (Signed)
     Subjective: 2 Days Post-Op Procedure(s) (LRB): LEFT TOTAL HIP ARTHROPLASTY ANTERIOR APPROACH (Left)   Patient reports pain as mild, pain controlled. He feels that he is doing much better with the extra night stay.  He was able to work with PT more comfortably, is more confident and pain is under better control.   Ready to be discharged home.   Objective:   VITALS:   Vitals:   12/21/16 0637 12/21/16 1108  BP: 136/74 136/88  Pulse: 73 78  Resp: 18 16  Temp: 97.8 F (36.6 C)     Dorsiflexion/Plantar flexion intact Incision: dressing C/D/I No cellulitis present Compartment soft  LABS  Recent Labs  12/20/16 0451 12/21/16 0421  HGB 11.0* 10.8*  HCT 32.3* 31.7*  WBC 12.1* 15.5*  PLT 206 204     Recent Labs  12/20/16 0451 12/21/16 0421  NA 135 136  K 4.0 4.1  BUN 13 19  CREATININE 0.69 0.75  GLUCOSE 163* 131*     Assessment/Plan: 2 Days Post-Op Procedure(s) (LRB): LEFT TOTAL HIP ARTHROPLASTY ANTERIOR APPROACH (Left) Up with therapy Discharge home Follow up in 2 weeks at [facility]. Follow up with OLIN,Virgilio Broadhead D in 2 weeks.  Contact information:  [facility] [address], [address] [place]   PAC  12/21/2016, 12:03 PM

## 2016-12-21 NOTE — Progress Notes (Signed)
Physical Therapy Treatment Patient Details Name: Samuel Goodwin MRN: [id number] DOB: July 01, 1961 Today's Date: Dec 28, 2016    History of Present Illness Pt is a 56 year old male s/p L DA THA with hx of R DA THA which dislocated and required revision of femoral component 02/07/16, L TKA    PT Comments    Pt ambulated in hallway, practiced one step and performed exercises.  Pt had no further questions and feels ready for d/c home today.  Follow Up Recommendations  Outpatient PT     Equipment Recommendations  None recommended by PT    Recommendations for Other Services       Precautions / Restrictions Precautions Precautions: Fall Restrictions Other Position/Activity Restrictions: WBAT    Mobility  Bed Mobility               General bed mobility comments: pt up in recliner on arrival  Transfers Overall transfer level: Needs assistance Equipment used: Rolling walker (2 wheeled) Transfers: Sit to/from Stand Sit to Stand: Min guard         General transfer comment: verbal cues for UE and LE positioning  Ambulation/Gait Ambulation/Gait assistance: Min guard Ambulation Distance (Feet): 360 Feet Assistive device: Rolling walker (2 wheeled) Gait Pattern/deviations: Step-through pattern     General Gait Details: focused on neutral foot position and heel to toe   Stairs Stairs: Yes   Stair Management: Step to pattern;Forwards;With walker Number of Stairs: 1 General stair comments: verbal cues for sequence and safety, performed twice  Wheelchair Mobility    Modified Rankin (Stroke Patients Only)       Balance                                    Cognition Arousal/Alertness: Awake/alert Behavior During Therapy: WFL for tasks assessed/performed Overall Cognitive Status: Within Functional Limits for tasks assessed                      Exercises Total Joint Exercises Hip ABduction/ADduction: AROM;10 reps;Left;Standing Long Arc  Quad: AROM;Left;10 reps;Seated Knee Flexion: AROM;10 reps;Standing;Left Marching in Standing: AROM;Left;10 reps;Standing    General Comments        Pertinent Vitals/Pain Pain Assessment: 0-10 Pain Score: 2  Pain Location: L hip Pain Descriptors / Indicators: Aching;Sore Pain Intervention(s): Limited activity within patient's tolerance;Monitored during session;Repositioned    Home Living                      Prior Function            PT Goals (current goals can now be found in the care plan section) Progress towards PT goals: Progressing toward goals    Frequency    7X/week      PT Plan Current plan remains appropriate    Co-evaluation             End of Session   Activity Tolerance: Patient tolerated treatment well Patient left: with call bell/phone within reach;with family/visitor present;in chair     Time: [id number] PT Time Calculation (min) (ACUTE ONLY): 24 min  Charges:  $Gait Training: 8-22 mins $Therapeutic Exercise: 8-22 mins                    G Codes:      [place] Oyama,KATHrine E 2016/12/28, 12:48 PM Carmelia Bake, PT, DPT 2016-12-28 Pager: [phone]

## 2016-12-25 NOTE — Discharge Summary (Signed)
Physician Discharge Summary  Patient ID: Samuel Goodwin MRN: [id number] DOB/AGE: 56-21-1962 56 y.o.  Admit date: 12/19/2016 Discharge date: 12/21/2016   Procedures:  Procedure(s) (LRB): LEFT TOTAL HIP ARTHROPLASTY ANTERIOR APPROACH (Left)  Attending Physician:  Dr. Paralee Cancel   Admission Diagnoses:   Left hip primary OA / pain  Discharge Diagnoses:  Principal Problem:   S/P left THA, AA Active Problems:   Morbid obesity ([place])  Past Medical History:  Diagnosis Date  . Alcohol abuse    Sobriety since 1990  . Anxiety   . Arthritis    oa  . Benign positional vertigo   . 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 Depression   . Eardrum trauma    30 years ago/right ear  . GERD (gastroesophageal reflux disease)   . Headache    sinus  . Hepatitis C 1994   hep c - ? etiology; in [place] 1989-91; S/P interferon , Dr [name]  . History of bronchitis   . Hoarseness   . Hyperlipidemia   . Hypertension   . Hypothyroidism   . OSA (obstructive sleep apnea)    on CPAP  . PONV (postoperative nausea and vomiting)    following knee surgery last year   . PTSD (post-traumatic stress disorder)   . Vocal cord polyps     HPI:    Samuel Goodwin, 56 y.o. male, has a history of pain and functional disability in the left hip(s) due to arthritis and patient has failed non-surgical conservative treatments for greater than 12 weeks to include NSAID's and/or analgesics, corticosteriod injections and activity modification.  Onset of symptoms was gradual starting 2+ years ago with gradually worsening course since that time.The patient noted prior procedures of the hip to include arthroplasty on the right hip on Feb 01, 2016 per Dr. Alvan Dame.  Patient currently rates pain in the left hip at 9 out of 10 with activity. Patient has night pain, worsening of pain with activity and weight bearing, trendelenberg gait, pain that interfers with activities of daily living and pain with passive range of motion.  Patient has evidence of periarticular osteophytes and joint space narrowing by imaging studies. This condition presents safety issues increasing the risk of falls. There is no current active infection.  Risks, benefits and expectations were discussed with the patient.  Risks including but not limited to the risk of anesthesia, blood clots, nerve damage, blood vessel damage, failure of the prosthesis, infection and up to and including death.  Patient understand the risks, benefits and expectations and wishes to proceed with surgery.   PCP: [name], MD   Discharged Condition: good  Hospital Course:  Patient underwent the above stated procedure on 12/19/2016. Patient tolerated the procedure well and brought to the recovery room in good condition and subsequently to the floor.  POD #1 BP: 108/62 ; Pulse: 76 ; Temp: 97.7 F (36.5 C) ; Resp: 17 Patient reports pain as moderate, pain controlled. Issues with trying to control the pain throughout the night.  Some concern with regards to previous dislocation of the other hip after a replacement.  Patient would benefit from another day as well as more PT to strengthen as well as build confidence for the patient with regards to using the left leg. Dorsiflexion/plantar flexion intact, incision: dressing C/D/I, no cellulitis present and compartment soft.   LABS  Basename    HGB     11.0  HCT     32.3   POD #  2  BP: 136/88 ; Pulse: 78 ; Temp: 97.8 F (36.6 C) ; Resp: 16 Patient reports pain as mild, pain controlled. He feels that he is doing much better with the extra night stay.  He was able to work with PT more comfortably, is more confident and pain is under better control.   Ready to be discharged home. Dorsiflexion/plantar flexion intact, incision: dressing C/D/I, no cellulitis present and compartment soft.   LABS  Basename    HGB     10.8  HCT     31.7    Discharge Exam: General appearance: alert, cooperative and no distress Extremities:  Homans sign is negative, no sign of DVT, no edema, redness or tenderness in the calves or thighs and no ulcers, gangrene or trophic changes  Disposition: Home with follow up in 2 weeks   Follow-up Information    [name], MD. Schedule an appointment as soon as possible for a visit in 2 week(s).   Specialty:  Orthopedic Surgery Contact information: [address] [place] [address] [id number]           Discharge Instructions    Call MD / Call 911    Complete by:  As directed    If you experience chest pain or shortness of breath, CALL 911 and be transported to the hospital emergency room.  If you develope a fever above 101 F, pus (white drainage) or increased drainage or redness at the wound, or calf pain, call your surgeon's office.   Change dressing    Complete by:  As directed    Maintain surgical dressing until follow up in the clinic. If the edges start to pull up, may reinforce with tape. If the dressing is no longer working, may remove and cover with gauze and tape, but must keep the area dry and clean.  Call with any questions or concerns.   Constipation Prevention    Complete by:  As directed    Drink plenty of fluids.  Prune juice may be helpful.  You may use a stool softener, such as Colace (over the counter) 100 mg twice a day.  Use MiraLax (over the counter) for constipation as needed.   Diet - low sodium heart healthy    Complete by:  As directed    Discharge instructions    Complete by:  As directed    Maintain surgical dressing until follow up in the clinic. If the edges start to pull up, may reinforce with tape. If the dressing is no longer working, may remove and cover with gauze and tape, but must keep the area dry and clean.  Follow up in 2 weeks at [facility]. Call with any questions or concerns.   Increase activity slowly as tolerated    Complete by:  As directed    Weight bearing as tolerated with assist device (walker,  cane, etc) as directed, use it as long as suggested by your surgeon or therapist, typically at least 4-6 weeks.   TED hose    Complete by:  As directed    Use stockings (TED hose) for 2 weeks on both leg(s).  You may remove them at night for sleeping.      Allergies as of 12/21/2016      Reactions   Dilaudid [hydromorphone Hcl] Nausea And Vomiting   "Questionable as to whether it was actually dilaudid  That made me so severely nauseous and vomit"   Benazepril Hcl Cough  Medication List    STOP taking these medications   aspirin EC 81 MG tablet Replaced by:  aspirin 81 MG chewable tablet   TYLENOL ARTHRITIS PAIN 650 MG CR tablet Generic drug:  acetaminophen     TAKE these medications   albuterol 108 (90 Base) MCG/ACT inhaler Commonly known as:  PROVENTIL HFA;VENTOLIN HFA Inhale 2 puffs into the lungs every 6 (six) hours as needed for wheezing or shortness of breath.   aspirin 81 MG chewable tablet Chew 1 tablet (81 mg total) by mouth 2 (two) times daily. Take for 4 weeks, the resume regular dose. Replaces:  aspirin EC 81 MG tablet   b complex vitamins tablet Take 2 tablets by mouth every evening.   BREO ELLIPTA 100-25 MCG/INH Aepb Generic drug:  fluticasone furoate-vilanterol Inhale 1 puff into the lungs daily.   docusate sodium 100 MG capsule Commonly known as:  COLACE Take 1 capsule (100 mg total) by mouth 2 (two) times daily.   ferrous sulfate 325 (65 FE) MG tablet Take 1 tablet (325 mg total) by mouth 3 (three) times daily after meals.   fluconazole 100 MG tablet Commonly known as:  DIFLUCAN Take 100 mg by mouth daily. X 14 days started 12-12-16 for yeast ibfection dr Alvan Dame aware   hydrochlorothiazide 25 MG tablet Commonly known as:  HYDRODIURIL Take 1 tablet (25 mg total) by mouth daily.   HYDROcodone-acetaminophen 7.5-325 MG tablet Commonly known as:  NORCO Take 1-2 tablets by mouth every 4 (four) hours as needed for moderate pain.   levothyroxine 25  MCG tablet Commonly known as:  SYNTHROID, LEVOTHROID Take 25 mcg by mouth daily before breakfast.   LIVALO 2 MG Tabs Generic drug:  Pitavastatin Calcium Take 2 mg by mouth at bedtime.   losartan 50 MG tablet Commonly known as:  COZAAR Take 50 mg by mouth daily.   methocarbamol 500 MG tablet Commonly known as:  ROBAXIN Take 1 tablet (500 mg total) by mouth every 6 (six) hours as needed for muscle spasms.   multivitamin tablet Take 1 tablet by mouth every morning.   omeprazole 20 MG capsule Commonly known as:  PRILOSEC Take 40 mg by mouth 2 (two) times daily.   polyethylene glycol packet Commonly known as:  MIRALAX / GLYCOLAX Take 17 g by mouth 2 (two) times daily.   UNABLE TO FIND daily. Synthroid 0.2mg  and  And 0.05 mg  In am   vitamin C 500 MG tablet Commonly known as:  ASCORBIC ACID Take 1,000 mg by mouth daily.        Signed: [place] Pugh. Ryllie Nieland   PA-C  12/25/2016, 12:30 PM

## 2016-12-26 ENCOUNTER — Other Ambulatory Visit: Payer: Self-pay | Admitting: *Deleted

## 2016-12-26 NOTE — Patient Outreach (Signed)
[facility]) Care Management  12/26/2016  Samuel Goodwin 08/27/1961 [id number]   Received verbal update from Wilhemina Cash at [facility], states patient is active with internal [employer] CM program and no outreach needed from [place] at this time.  RNCM will proceed with case closure due to patient active with other CM program and send case closure request to [facility] [place]  Management.      Makinzy Cleere H. Annia Friendly, BSN, [place] Management [facility] Telephonic CM Phone: [phone] Fax: [phone]

## 2017-04-08 IMAGING — DX DG HIP (WITH OR WITHOUT PELVIS) 1V PORT*R*
3 series · 3 of 3 positions shown · non-contrast
Comparison: None.

CLINICAL DATA: Status post hip replacement.  Initial encounter.

EXAM:
DG HIP (WITH OR WITHOUT PELVIS) 1V PORT RIGHT

[hip frog leg]
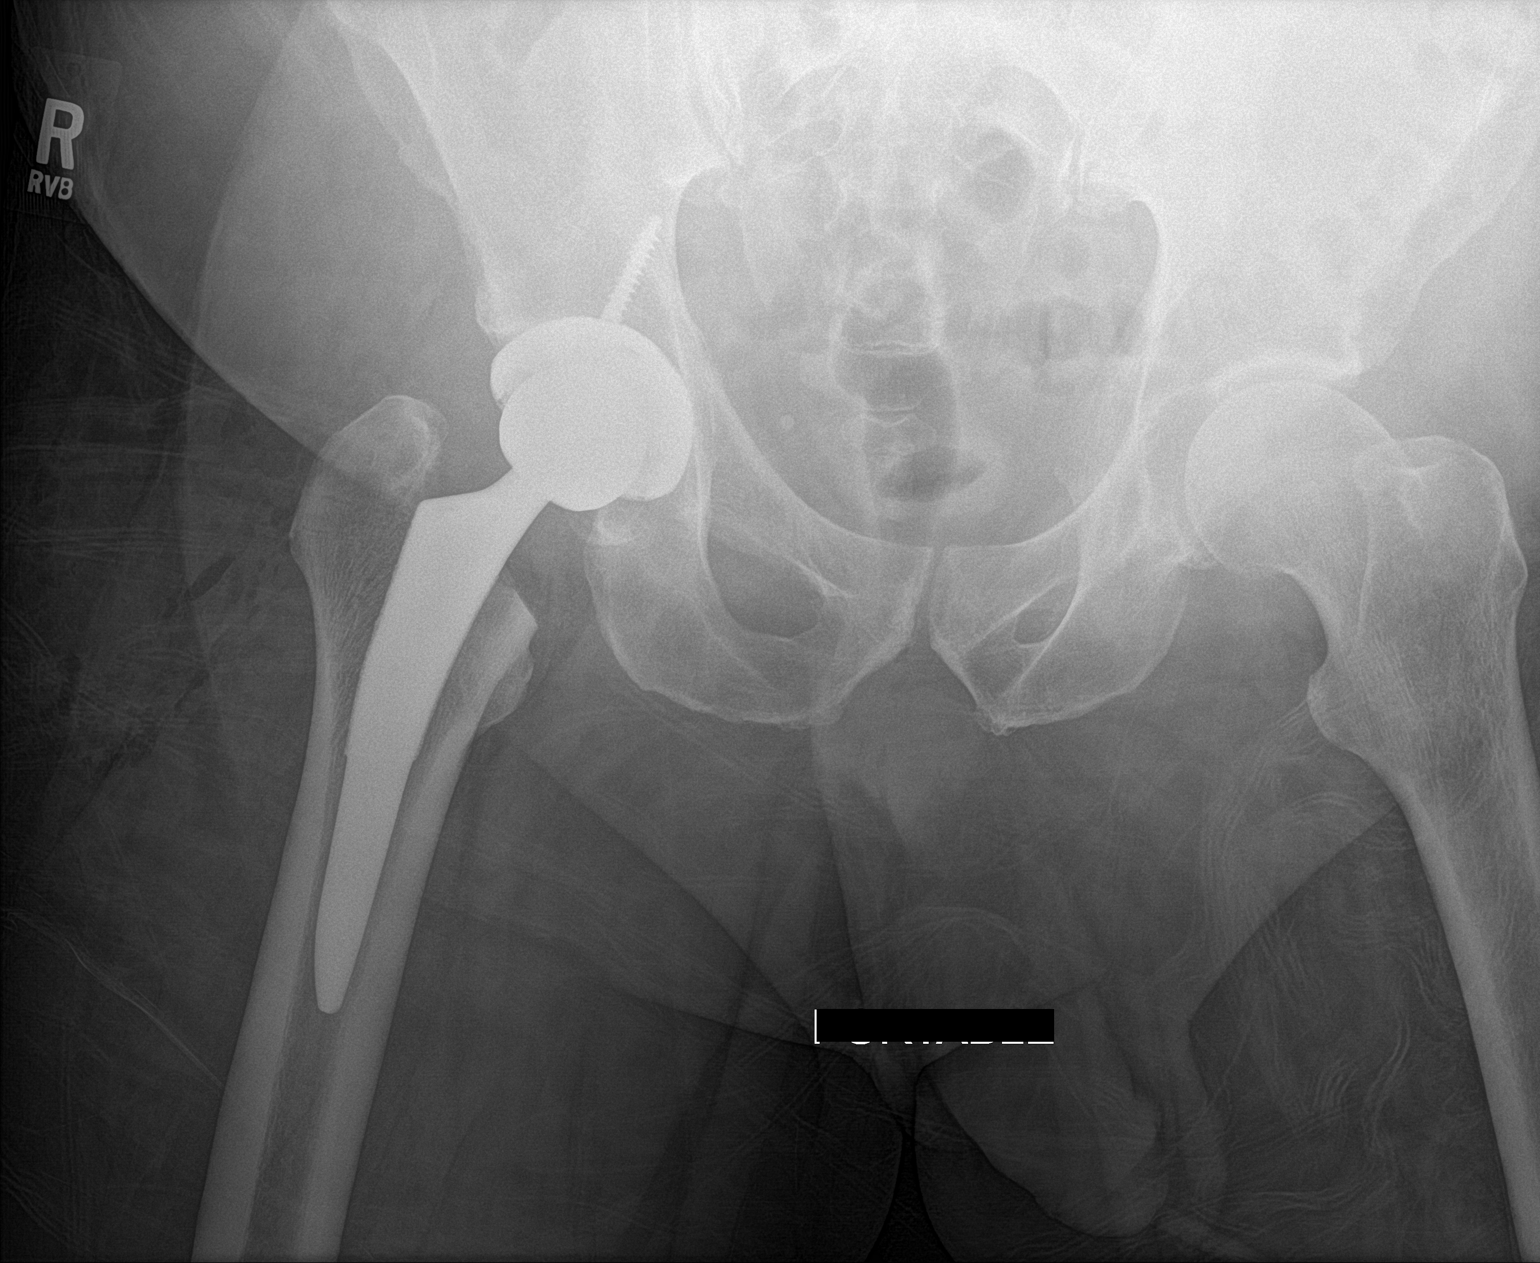

[hip lat (1 of 2)]
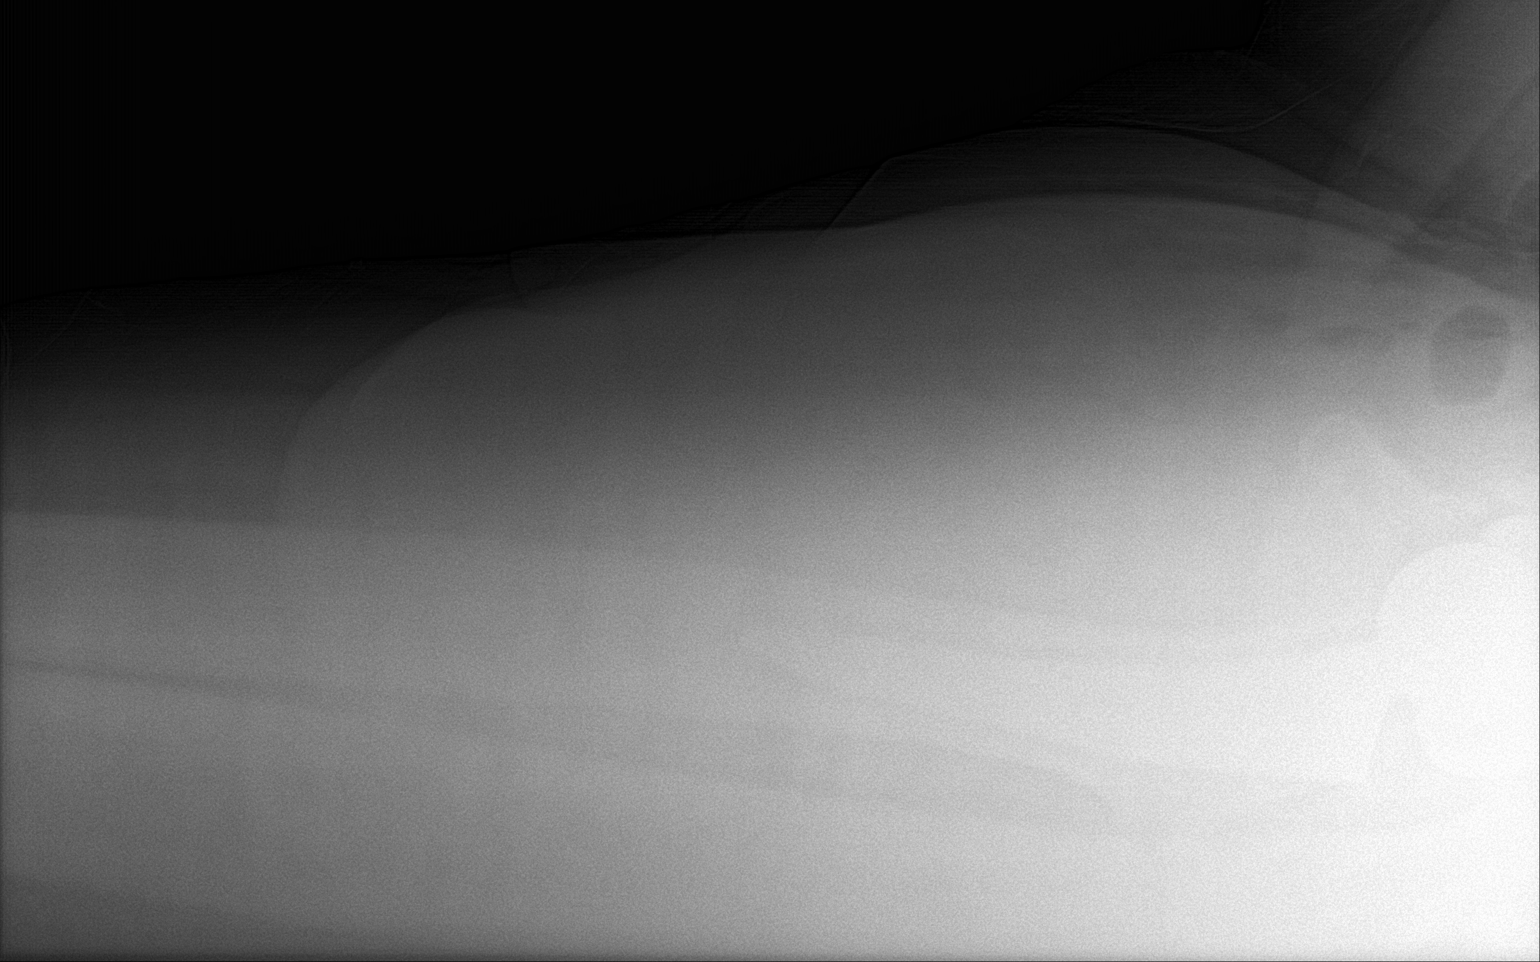

[hip lat (2 of 2)]
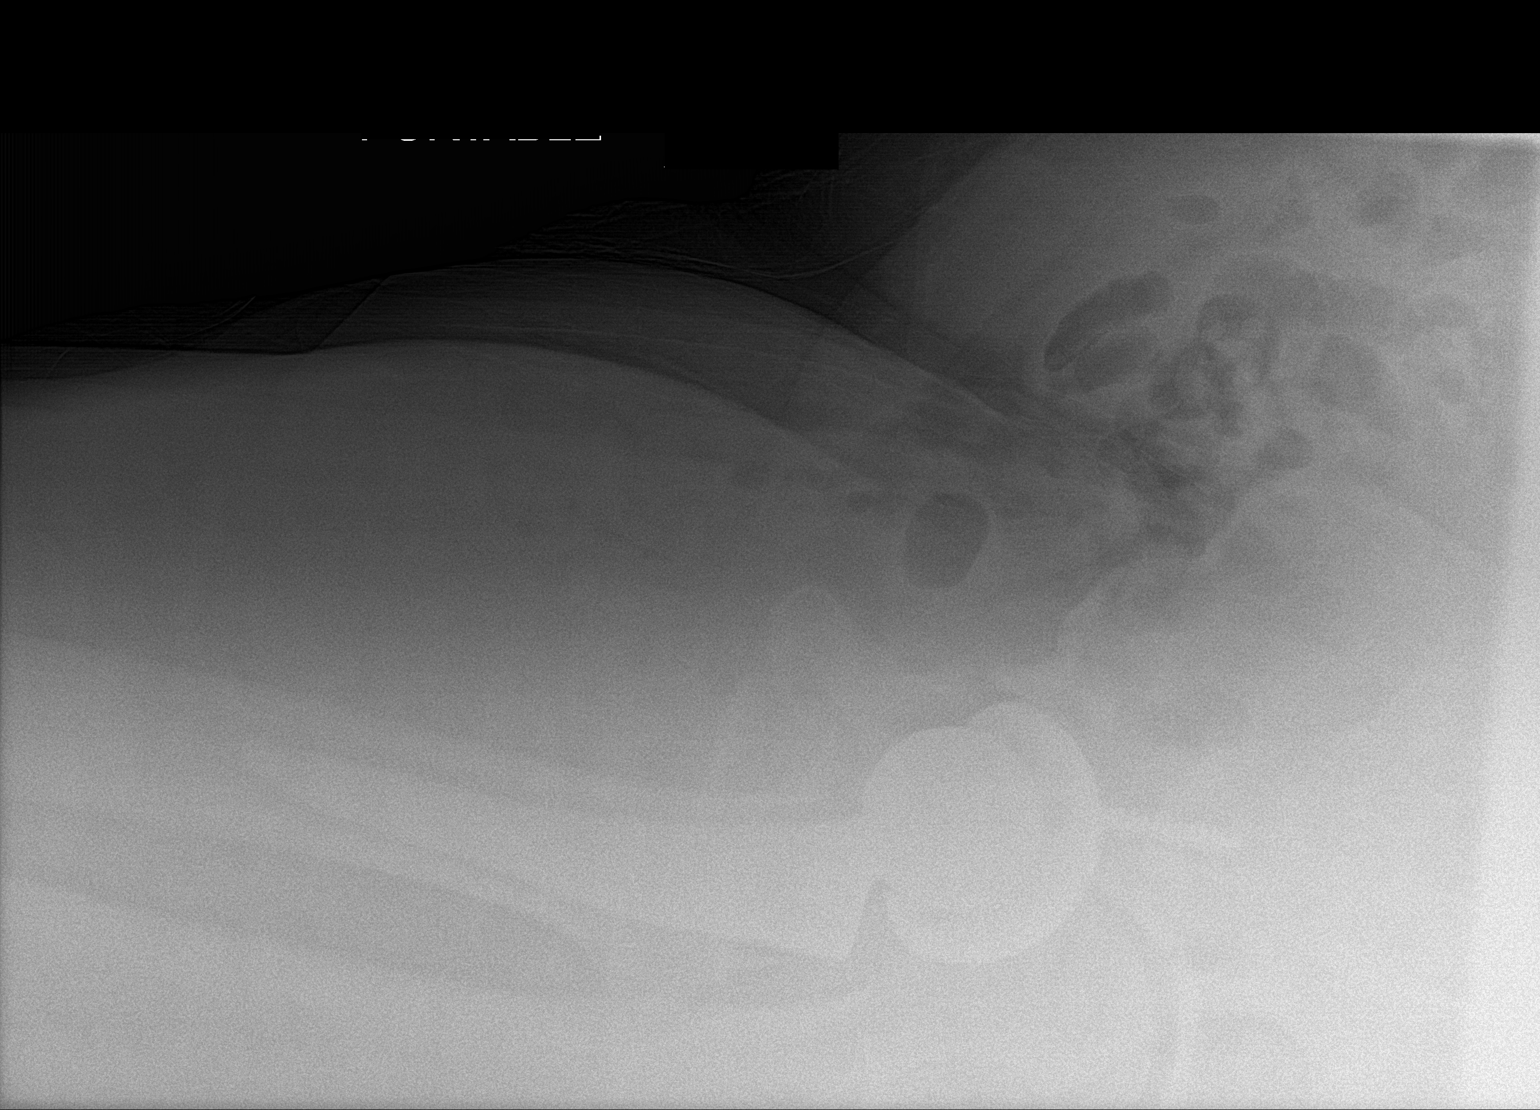

[3 of 3 positions shown; findings below may reference images not displayed]

FINDINGS: Total right hip arthroplasty. Oblique lucency over the diaphysis of
the right femur is similar to neighboring fat muscle interfaces.
There is no convincing periprosthetic fracture. Negative visualized
pelvis and left hip.
IMPRESSION: Located total right hip arthroplasty without convincing fracture, as
above.

## 2018-11-05 ENCOUNTER — Inpatient Hospital Stay (HOSPITAL_COMMUNITY)
Admission: EM | Admit: 2018-11-05 | Discharge: 2018-11-17 | DRG: 467 | Disposition: A | Discharge status: Home Health | Payer: No Typology Code available for payment source | Attending: Internal Medicine | Admitting: Internal Medicine

## 2018-11-05 ENCOUNTER — Encounter (HOSPITAL_COMMUNITY): Payer: Self-pay

## 2018-11-05 ENCOUNTER — Emergency Department (HOSPITAL_COMMUNITY): Payer: No Typology Code available for payment source

## 2018-11-05 ENCOUNTER — Other Ambulatory Visit: Payer: Self-pay

## 2018-11-05 DIAGNOSIS — I10 Essential (primary) hypertension: Secondary | ICD-10-CM | POA: Diagnosis present

## 2018-11-05 DIAGNOSIS — Z96642 Presence of left artificial hip joint: Secondary | ICD-10-CM | POA: Diagnosis present

## 2018-11-05 DIAGNOSIS — M1990 Unspecified osteoarthritis, unspecified site: Secondary | ICD-10-CM | POA: Diagnosis present

## 2018-11-05 DIAGNOSIS — E039 Hypothyroidism, unspecified: Secondary | ICD-10-CM | POA: Diagnosis present

## 2018-11-05 DIAGNOSIS — Z7982 Long term (current) use of aspirin: Secondary | ICD-10-CM | POA: Diagnosis not present

## 2018-11-05 DIAGNOSIS — Z96652 Presence of left artificial knee joint: Secondary | ICD-10-CM | POA: Diagnosis present

## 2018-11-05 DIAGNOSIS — K5900 Constipation, unspecified: Secondary | ICD-10-CM | POA: Diagnosis not present

## 2018-11-05 DIAGNOSIS — F4310 Post-traumatic stress disorder, unspecified: Secondary | ICD-10-CM | POA: Diagnosis present

## 2018-11-05 DIAGNOSIS — Z6839 Body mass index (BMI) 39.0-39.9, adult: Secondary | ICD-10-CM | POA: Diagnosis not present

## 2018-11-05 DIAGNOSIS — Z8249 Family history of ischemic heart disease and other diseases of the circulatory system: Secondary | ICD-10-CM

## 2018-11-05 DIAGNOSIS — Y9301 Activity, walking, marching and hiking: Secondary | ICD-10-CM | POA: Diagnosis present

## 2018-11-05 DIAGNOSIS — E669 Obesity, unspecified: Secondary | ICD-10-CM | POA: Diagnosis present

## 2018-11-05 DIAGNOSIS — S7291XA Unspecified fracture of right femur, initial encounter for closed fracture: Secondary | ICD-10-CM | POA: Diagnosis not present

## 2018-11-05 DIAGNOSIS — Z419 Encounter for procedure for purposes other than remedying health state, unspecified: Secondary | ICD-10-CM

## 2018-11-05 DIAGNOSIS — J45909 Unspecified asthma, uncomplicated: Secondary | ICD-10-CM | POA: Diagnosis present

## 2018-11-05 DIAGNOSIS — Y792 Prosthetic and other implants, materials and accessory orthopedic devices associated with adverse incidents: Secondary | ICD-10-CM | POA: Diagnosis present

## 2018-11-05 DIAGNOSIS — Z8619 Personal history of other infectious and parasitic diseases: Secondary | ICD-10-CM | POA: Diagnosis not present

## 2018-11-05 DIAGNOSIS — D62 Acute posthemorrhagic anemia: Secondary | ICD-10-CM | POA: Diagnosis not present

## 2018-11-05 DIAGNOSIS — S7290XA Unspecified fracture of unspecified femur, initial encounter for closed fracture: Secondary | ICD-10-CM | POA: Diagnosis present

## 2018-11-05 DIAGNOSIS — Z79890 Hormone replacement therapy (postmenopausal): Secondary | ICD-10-CM | POA: Diagnosis not present

## 2018-11-05 DIAGNOSIS — D649 Anemia, unspecified: Secondary | ICD-10-CM | POA: Diagnosis not present

## 2018-11-05 DIAGNOSIS — Z888 Allergy status to other drugs, medicaments and biological substances status: Secondary | ICD-10-CM

## 2018-11-05 DIAGNOSIS — Z7951 Long term (current) use of inhaled steroids: Secondary | ICD-10-CM | POA: Diagnosis not present

## 2018-11-05 DIAGNOSIS — G4733 Obstructive sleep apnea (adult) (pediatric): Secondary | ICD-10-CM | POA: Diagnosis present

## 2018-11-05 DIAGNOSIS — Z8349 Family history of other endocrine, nutritional and metabolic diseases: Secondary | ICD-10-CM | POA: Diagnosis not present

## 2018-11-05 DIAGNOSIS — M9701XA Periprosthetic fracture around internal prosthetic right hip joint, initial encounter: Secondary | ICD-10-CM | POA: Diagnosis present

## 2018-11-05 DIAGNOSIS — T84019A Broken internal joint prosthesis, unspecified site, initial encounter: Secondary | ICD-10-CM

## 2018-11-05 DIAGNOSIS — S72009A Fracture of unspecified part of neck of unspecified femur, initial encounter for closed fracture: Secondary | ICD-10-CM | POA: Diagnosis present

## 2018-11-05 DIAGNOSIS — K219 Gastro-esophageal reflux disease without esophagitis: Secondary | ICD-10-CM | POA: Diagnosis present

## 2018-11-05 DIAGNOSIS — Z885 Allergy status to narcotic agent status: Secondary | ICD-10-CM | POA: Diagnosis not present

## 2018-11-05 DIAGNOSIS — E785 Hyperlipidemia, unspecified: Secondary | ICD-10-CM | POA: Diagnosis present

## 2018-11-05 DIAGNOSIS — Z96649 Presence of unspecified artificial hip joint: Secondary | ICD-10-CM

## 2018-11-05 DIAGNOSIS — W19XXXA Unspecified fall, initial encounter: Secondary | ICD-10-CM | POA: Diagnosis present

## 2018-11-05 DIAGNOSIS — M25551 Pain in right hip: Secondary | ICD-10-CM | POA: Diagnosis present

## 2018-11-05 DIAGNOSIS — Z96641 Presence of right artificial hip joint: Secondary | ICD-10-CM

## 2018-11-05 DIAGNOSIS — Z79899 Other long term (current) drug therapy: Secondary | ICD-10-CM

## 2018-11-05 DIAGNOSIS — S72001A Fracture of unspecified part of neck of right femur, initial encounter for closed fracture: Principal | ICD-10-CM | POA: Diagnosis present

## 2018-11-05 LAB — CBC
HEMATOCRIT: 37.9 % — AB (ref 39.0–52.0)
HEMOGLOBIN: 12.7 g/dL — AB (ref 13.0–17.0)
MCH: 29.8 pg (ref 26.0–34.0)
MCHC: 33.5 g/dL (ref 30.0–36.0)
MCV: 89 fL (ref 80.0–100.0)
Platelets: 179 10*3/uL (ref 150–400)
RBC: 4.26 MIL/uL (ref 4.22–5.81)
RDW: 12.6 % (ref 11.5–15.5)
WBC: 7.6 10*3/uL (ref 4.0–10.5)
nRBC: 0 % (ref 0.0–0.2)

## 2018-11-05 LAB — BASIC METABOLIC PANEL
Anion gap: 9 (ref 5–15)
BUN: 20 mg/dL (ref 6–20)
CHLORIDE: 103 mmol/L (ref 98–111)
CO2: 26 mmol/L (ref 22–32)
CREATININE: 0.81 mg/dL (ref 0.61–1.24)
Calcium: 8.3 mg/dL — ABNORMAL LOW (ref 8.9–10.3)
GFR calc Af Amer: >60 mL/min (ref >60)
GFR calc non Af Amer: >60 mL/min (ref >60)
Glucose, Bld: 99 mg/dL (ref 70–99)
Potassium: 3.9 mmol/L (ref 3.5–5.1)
Sodium: 138 mmol/L (ref 135–145)

## 2018-11-05 LAB — PROTIME-INR
INR: 0.92
Prothrombin Time: 12.3 seconds (ref 11.4–15.2)

## 2018-11-05 MED ORDER — HYDROCODONE-ACETAMINOPHEN 7.5-325 MG PO TABS: 1.0000 | ORAL_TABLET | Freq: Four times a day (QID) | ORAL | Status: DC | PRN

## 2018-11-05 MED ORDER — MORPHINE SULFATE (PF) 4 MG/ML IV SOLN
4.0000 mg | Freq: Once | INTRAVENOUS | Status: AC
  Administered 2018-11-05: 4 mg via INTRAVENOUS
  Filled 2018-11-05: qty 1

## 2018-11-05 MED ORDER — HYDROMORPHONE HCL 1 MG/ML IJ SOLN
0.5000 mg | INTRAMUSCULAR | Status: DC | PRN
  Administered 2018-11-05 – 2018-11-06 (×4): 1 mg via INTRAVENOUS
  Filled 2018-11-05 (×4): qty 1

## 2018-11-05 MED ORDER — ONDANSETRON HCL 4 MG/2ML IJ SOLN
4.0000 mg | Freq: Once | INTRAMUSCULAR | Status: AC
  Administered 2018-11-05: 4 mg via INTRAVENOUS
  Filled 2018-11-05: qty 2

## 2018-11-05 NOTE — ED Notes (Signed)
ED TO INPATIENT HANDOFF REPORT  Name/Age/Gender Samuel Goodwin 57 y.o. male  Code Status Code Status History    Date Active Date Inactive Code Status Order ID Comments User Context   12/19/2016 1513 12/21/2016 1638 Full Code [id number]  Norman Herrlich Inpatient   11/27/2014 1304 11/30/2014 1921 Full Code [id number]  Augustin Schooling, MD Inpatient      Home/SNF/Other Home  Chief Complaint hip injury  Level of Care/Admitting Diagnosis ED Disposition    ED Disposition Condition [place]: [facility] [[id number]]  Level of Care: Med-Surg [16]  Diagnosis: Femoral fracture [facility]) [[id number]]  Admitting Physician: Shela Leff [[id number]]  Attending Physician: Shela Leff [[id number]]  Estimated length of stay: past midnight tomorrow  Certification:: I certify this patient will need inpatient services for at least 2 midnights  PT Class (Do Not Modify): Inpatient [101]  PT Acc Code (Do Not Modify): Private [1]       Medical History Past Medical History:  Diagnosis Date  . Alcohol abuse    Sobriety since 1990  . Anxiety   . Arthritis    oa  . Benign positional vertigo   . 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 Depression   . Eardrum trauma    30 years ago/right ear  . GERD (gastroesophageal reflux disease)   . Headache    sinus  . Hepatitis C 1994   hep c - ? etiology; in [place] 1989-91; S/P interferon , Dr [name]  . History of bronchitis   . Hoarseness   . Hyperlipidemia   . Hypertension   . Hypothyroidism   . OSA (obstructive sleep apnea)    on CPAP  . PONV (postoperative nausea and vomiting)    following knee surgery last year   . PTSD (post-traumatic stress disorder)   . Vocal cord polyps     Allergies Allergies  Allergen Reactions  . Dilaudid [Hydromorphone Hcl] Nausea And Vomiting    "Questionable as to whether it was actually dilaudid  That made me so severely nauseous and vomit"  . Benazepril Hcl Cough     IV Location/Drains/Wounds Patient Lines/Drains/Airways Status   Active Line/Drains/Airways    Name:   Placement date:   Placement time:   Site:   Days:   Peripheral IV 11/05/18 Right Antecubital   11/05/18    -    Antecubital   less than 1   Incision (Closed) 02/01/16 Hip Right   02/01/16    1438     1008   Incision (Closed) 02/07/16 Hip Right   02/07/16    1906     1002   Incision (Closed) 12/19/16 Hip Left   12/19/16    1257     686          Labs/Imaging Results for orders placed or performed during the hospital encounter of 11/05/18 (from the past 48 hour(s))  CBC     Status: Abnormal   Collection Time: 11/05/18  5:51 PM  Result Value Ref Range   WBC 7.6 4.0 - 10.5 K/uL   RBC 4.26 4.22 - 5.81 MIL/uL   Hemoglobin 12.7 (L) 13.0 - 17.0 g/dL   HCT 37.9 (L) 39.0 - 52.0 %   MCV 89.0 80.0 - 100.0 fL   MCH 29.8 26.0 - 34.0 pg   MCHC 33.5 30.0 - 36.0 g/dL   RDW 12.6 11.5 - 15.5 %   Platelets 179 150 - 400 K/uL  nRBC 0.0 0.0 - 0.2 %    Comment: Performed at [facility], [place] [address] [place], [place] [address]  Basic metabolic panel     Status: Abnormal   Collection Time: 11/05/18  5:51 PM  Result Value Ref Range   Sodium 138 135 - 145 mmol/L   Potassium 3.9 3.5 - 5.1 mmol/L   Chloride 103 98 - 111 mmol/L   CO2 26 22 - 32 mmol/L   Glucose, Bld 99 70 - 99 mg/dL   BUN 20 6 - 20 mg/dL   Creatinine, Ser 0.81 0.61 - 1.24 mg/dL   Calcium 8.3 (L) 8.9 - 10.3 mg/dL   GFR calc non Af Amer >60 >60 mL/min   GFR calc Af Amer >60 >60 mL/min   Anion gap 9 5 - 15    Comment: Performed at [facility], [place] [address], [place], [place] [address]   Dg Chest Portable 1 View  Result Date: 11/05/2018 CLINICAL DATA:  Preop for hip surgery. EXAM: PORTABLE CHEST 1 VIEW COMPARISON:  Radiographs of July 05, 2013. FINDINGS: Stable cardiomegaly. Both lungs are clear. The visualized skeletal structures are unremarkable. IMPRESSION: No active disease.  Electronically Signed   By: Marijo Conception, M.D.   On: 11/05/2018 19:47   Dg Hip Unilat  With Pelvis 2-3 Views Right  Result Date: 11/05/2018 CLINICAL DATA:  Fall EXAM: DG HIP (WITH OR WITHOUT PELVIS) 2-3V RIGHT COMPARISON:  02/05/2016 FINDINGS: Right hip replacement. Fracture of the femoral prosthesis at the femoral neck level. Mild angulation. Acetabulum in good position. No bony fracture Left hip replacement in satisfactory position IMPRESSION: Fracture of right femoral prosthesis Electronically Signed   By: Franchot Gallo M.D.   On: 11/05/2018 18:55    Pending Labs Unresulted Labs (From admission, onward)    Start     Ordered   11/05/18 [place]  Once,   STAT     11/05/18 1919          Vitals/Pain Today's Vitals   11/05/18 1800 11/05/18 1950 11/05/18 2020 11/05/18 2021  BP: 114/71   (!) 131/97  Pulse: 63   65  Resp:    16  Temp:      TempSrc:      SpO2: 97%   100%  Weight:      PainSc:  10-Worst pain ever 5      Isolation Precautions No active isolations  Medications Medications  HYDROcodone-acetaminophen (NORCO) 7.5-325 MG per tablet 1 tablet (has no administration in time range)  HYDROmorphone (DILAUDID) injection 0.5-1 mg (has no administration in time range)  morphine 4 MG/ML injection 4 mg (4 mg Intravenous Given 11/05/18 1806)  ondansetron (ZOFRAN) injection 4 mg (4 mg Intravenous Given 11/05/18 1806)  morphine 4 MG/ML injection 4 mg (4 mg Intravenous Given 11/05/18 1959)    Mobility non-ambulatory

## 2018-11-05 NOTE — Progress Notes (Signed)
Set patient's home cpap up. Will place self on. RT will continue to monitor.

## 2018-11-05 NOTE — ED Notes (Signed)
Condom cath placed on pt due to nature of injury

## 2018-11-05 NOTE — ED Triage Notes (Addendum)
[place] Patient reports he was [place] walk through the park and reports taking a step, hearing a "pop", then falling to the ground. Obvious shortening and rotation noted, per EMS. Patient has history of dislocating this hip while asleep 2 years ago. 18G Right AC PIV placed by EMS - 290mcg IV Fentanyl administered by EMS. Dr. Alvan Dame at [facility] is patient's stated orthopedist, and patient requests he be called. Distal pulses intact, per EMS report. Patient AxOx4. Patient denies LOC upon fall.

## 2018-11-05 NOTE — Consult Note (Signed)
Reason for ConsultRight Hip Pain Referring Physician: Dr.Knapp  Samuel Goodwin is an 57 y.o. male.  HPI: Patient is approximateley two years pos tRight Total hip and had a revision for a dislocated hip.He was walking in the park and felt a pop and sustained a Fracture of his Femoral Prosthesis at the base of the neck.  Past Medical History:  Diagnosis Date  . Alcohol abuse    Sobriety since 1990  . Anxiety   . Arthritis    oa  . Benign positional vertigo   . 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 Depression   . Eardrum trauma    30 years ago/right ear  . GERD (gastroesophageal reflux disease)   . Headache    sinus  . Hepatitis C 1994   hep c - ? etiology; in [place] 1989-91; S/P interferon , Dr [name]  . History of bronchitis   . Hoarseness   . Hyperlipidemia   . Hypertension   . Hypothyroidism   . OSA (obstructive sleep apnea)    on CPAP  . PONV (postoperative nausea and vomiting)    following knee surgery last year   . PTSD (post-traumatic stress disorder)   . Vocal cord polyps     Past Surgical History:  Procedure Laterality Date  . ANTERIOR HIP REVISION Right 02/07/2016   Procedure: ANTERIOR HIP REVISION;  Surgeon: Paralee Cancel, MD;  Location: [facility];  Service: Orthopedics;  Laterality: Right;  . CARDIOVASCULAR STRESS TEST  02/09/2010   No scintigraphic evidence of inducible ischemia.  [name] HIP CLOSED REDUCTION Right 02/05/2016   Procedure: CLOSED MANIPULATION HIP;  Surgeon: Latanya Maudlin, MD;  Location: [facility];  Service: Orthopedics;  Laterality: Right;  . knee arthroscopic Bilateral   . LARYNGOSCOPY  08/2009   Dr.[name]  . right knee arthroscopic knee surgery  12 yrs ago   dr Theda Sers  . THROAT SURGERY  aug, sept, Nov 16 2016   vocal cord  laser sugery X 3, Dr [name] , [facility]  . TOTAL HIP ARTHROPLASTY Right 02/01/2016   Procedure: RIGHT TOTAL HIP ARTHROPLASTY ANTERIOR APPROACH;  Surgeon: Paralee Cancel, MD;  Location: [facility];  Service: Orthopedics;  Laterality: Right;  .  TOTAL HIP ARTHROPLASTY Left 12/19/2016   Procedure: LEFT TOTAL HIP ARTHROPLASTY ANTERIOR APPROACH;  Surgeon: Paralee Cancel, MD;  Location: [facility];  Service: Orthopedics;  Laterality: Left;  . TOTAL KNEE ARTHROPLASTY Left 11/27/2014   dr [name]  . TOTAL KNEE ARTHROPLASTY Left 11/27/2014   Procedure: LEFT TOTAL KNEE ARTHROPLASTY;  Surgeon: Augustin Schooling, MD;  Location: [place];  Service: Orthopedics;  Laterality: Left;  . TRANSTHORACIC ECHOCARDIOGRAM  11/08/2005   EF 68%, normal LV systolic function  . UPPER GASTROINTESTINAL ENDOSCOPY  2010   Negative, Dr.[name]    Family History  Problem Relation Age of Onset  . Cirrhosis Father   . Diabetes Father   . Stroke Father   . Heart failure Mother   . Hypertension Mother   . Thyroid disease Mother   . Heart attack Brother 62  . Aneurysm Maternal Aunt         AAA  . Coronary artery disease Maternal Uncle        MI late 2s    Social History:  reports that he has never smoked. He has never used smokeless tobacco. He reports that he does not drink alcohol or use drugs.  Allergies:  Allergies  Allergen Reactions  . Dilaudid [Hydromorphone Hcl] Nausea And Vomiting    "  Questionable as to whether it was actually dilaudid  That made me so severely nauseous and vomit"  . Benazepril Hcl Cough    Medications: I have reviewed the patient's current medications.  Results for orders placed or performed during the hospital encounter of 11/05/18 (from the past 48 hour(s))  CBC     Status: Abnormal   Collection Time: 11/05/18  5:51 PM  Result Value Ref Range   WBC 7.6 4.0 - 10.5 K/uL   RBC 4.26 4.22 - 5.81 MIL/uL   Hemoglobin 12.7 (L) 13.0 - 17.0 g/dL   HCT 37.9 (L) 39.0 - 52.0 %   MCV 89.0 80.0 - 100.0 fL   MCH 29.8 26.0 - 34.0 pg   MCHC 33.5 30.0 - 36.0 g/dL   RDW 12.6 11.5 - 15.5 %   Platelets 179 150 - 400 K/uL   nRBC 0.0 0.0 - 0.2 %    Comment: Performed at [facility], [place] [address], [place], [place] [address]   Basic metabolic panel     Status: Abnormal   Collection Time: 11/05/18  5:51 PM  Result Value Ref Range   Sodium 138 135 - 145 mmol/L   Potassium 3.9 3.5 - 5.1 mmol/L   Chloride 103 98 - 111 mmol/L   CO2 26 22 - 32 mmol/L   Glucose, Bld 99 70 - 99 mg/dL   BUN 20 6 - 20 mg/dL   Creatinine, Ser 0.81 0.61 - 1.24 mg/dL   Calcium 8.3 (L) 8.9 - 10.3 mg/dL   GFR calc non Af Amer >60 >60 mL/min   GFR calc Af Amer >60 >60 mL/min   Anion gap 9 5 - 15    Comment: Performed at [facility], [place] [address] [place], [place] [address]    Dg Chest Portable 1 View  Result Date: 11/05/2018 CLINICAL DATA:  Preop for hip surgery. EXAM: PORTABLE CHEST 1 VIEW COMPARISON:  Radiographs of July 05, 2013. FINDINGS: Stable cardiomegaly. Both lungs are clear. The visualized skeletal structures are unremarkable. IMPRESSION: No active disease. Electronically Signed   By: Marijo Conception, M.D.   On: 11/05/2018 19:47   Dg Hip Unilat  With Pelvis 2-3 Views Right  Result Date: 11/05/2018 CLINICAL DATA:  Fall EXAM: DG HIP (WITH OR WITHOUT PELVIS) 2-3V RIGHT COMPARISON:  02/05/2016 FINDINGS: Right hip replacement. Fracture of the femoral prosthesis at the femoral neck level. Mild angulation. Acetabulum in good position. No bony fracture Left hip replacement in satisfactory position IMPRESSION: Fracture of right femoral prosthesis Electronically Signed   By: Franchot Gallo M.D.   On: 11/05/2018 18:55    Review of Systems  Constitutional: Negative.   HENT: Negative.   Eyes: Negative.   Respiratory: Negative.   Cardiovascular: Negative.   Gastrointestinal: Negative.   Genitourinary: Negative.   Musculoskeletal: Positive for joint pain.  Skin: Negative.   Neurological: Negative.   Endo/Heme/Allergies: Negative.   Psychiatric/Behavioral: Negative.    Blood pressure 114/71, pulse 63, temperature 97.9 F (36.6 C), temperature source Oral, resp. rate 18, weight 131.1 kg, SpO2 97 %. Physical  Exam  Constitutional: He appears well-developed.  HENT:  Head: Normocephalic.  Eyes: Pupils are equal, round, and reactive to light.  Neck: Normal range of motion.  Cardiovascular: Normal rate.  Respiratory: Effort normal.  GI: Soft.  Musculoskeletal: He exhibits deformity.  Right Lower is shortened and externally rotated.Marland KitchenAnterior incision over right Hip  Neurological: He is alert.  Skin: Skin is warm.  Psychiatric: He has  a normal mood and affect.    Assessment/Plan: Admit by the Hospitalist for revision Surgery by DR.Alvan Dame.  Latanya Maudlin MD 11/05/2018, 7:54 PM

## 2018-11-05 NOTE — ED Notes (Signed)
Called to give report. Nurse was in a patient's room and said she would call back.

## 2018-11-05 NOTE — ED Provider Notes (Addendum)
[facility] DEPT Provider Note   CSN: [id number] Arrival date & time: 11/05/18  1727     History   Chief Complaint Chief Complaint  Patient presents with  . Hip Pain  . Fall    HPI Samuel Goodwin is a 57 y.o. male.  HPI Patient presents to the emergency room for evaluation of hip pain.  Patient has a history of hip replacement in his right hip.  Patient was walking in the park today when he suddenly felt a pop and then he fell to the ground.  Patient was unable to get up and stand and EMS was called.  They noticed shortening and rotation.  She was given 200 mcg of fentanyl IV.  He has some improvement of his hip pain now.  Patient denies any trouble with any other injuries.  No numbness or weakness.  Patient states he has dislocated his hip in the past.  Previously when it happened, Dr. Gladstone Lighter was unable to reduce his hip.  Patient ended up staying in traction and going back to the OR under Dr. Aurea Graff care Past Medical History:  Diagnosis Date  . Alcohol abuse    Sobriety since 1990  . Anxiety   . Arthritis    oa  . Benign positional vertigo   . 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 Depression   . Eardrum trauma    30 years ago/right ear  . GERD (gastroesophageal reflux disease)   . Headache    sinus  . Hepatitis C 1994   hep c - ? etiology; in [place] 1989-91; S/P interferon , Dr [name]  . History of bronchitis   . Hoarseness   . Hyperlipidemia   . Hypertension   . Hypothyroidism   . OSA (obstructive sleep apnea)    on CPAP  . PONV (postoperative nausea and vomiting)    following knee surgery last year   . PTSD (post-traumatic stress disorder)   . Vocal cord polyps     Patient Active Problem List   Diagnosis Date Noted  . Morbid obesity ([place]) 12/20/2016  . S/P left THA, AA 12/19/2016  . Chest pain 08/15/2016  . S/P right TH revision 02/09/2016  . Hip instability 02/05/2016  . H/O total hip arthroplasty 02/05/2016  . S/P  right THA, AA 02/01/2016  . S/P knee replacement 11/27/2014  . Unspecified viral hepatitis C without hepatic coma 08/28/2014  . DYSPHONIA, CHRONIC 01/06/2010  . NONSPECIFIC ABNORMAL ELECTROCARDIOGRAM 01/06/2010  . HOARSENESS, CHRONIC 05/31/2009  . Hypothyroidism 04/09/2009  . HYPERLIPIDEMIA 04/09/2009  . Essential hypertension 04/09/2009  . GERD 04/09/2009  . Sleep apnea 04/09/2009    Past Surgical History:  Procedure Laterality Date  . ANTERIOR HIP REVISION Right 02/07/2016   Procedure: ANTERIOR HIP REVISION;  Surgeon: Paralee Cancel, MD;  [facility];  Service: Orthopedics;  Laterality: Right;  . CARDIOVASCULAR STRESS TEST  02/09/2010   No scintigraphic evidence of inducible ischemia.  [name] HIP CLOSED REDUCTION Right 02/05/2016   Procedure: CLOSED MANIPULATION HIP;  Surgeon: Latanya Maudlin, MD;  [facility];  Service: Orthopedics;  Laterality: Right;  . knee arthroscopic Bilateral   . LARYNGOSCOPY  08/2009   Dr.[name]  . right knee arthroscopic knee surgery  12 yrs ago   dr Theda Sers  . THROAT SURGERY  aug, sept, Nov 16 2016   vocal cord  laser sugery X 3, Dr [name] , [facility]  . TOTAL HIP ARTHROPLASTY Right 02/01/2016   Procedure: RIGHT TOTAL  HIP ARTHROPLASTY ANTERIOR APPROACH;  Surgeon: Paralee Cancel, MD;  [facility];  Service: Orthopedics;  Laterality: Right;  . TOTAL HIP ARTHROPLASTY Left 12/19/2016   Procedure: LEFT TOTAL HIP ARTHROPLASTY ANTERIOR APPROACH;  Surgeon: Paralee Cancel, MD;  [facility];  Service: Orthopedics;  Laterality: Left;  . TOTAL KNEE ARTHROPLASTY Left 11/27/2014   dr [name]  . TOTAL KNEE ARTHROPLASTY Left 11/27/2014   Procedure: LEFT TOTAL KNEE ARTHROPLASTY;  Surgeon: Augustin Schooling, MD;  Location: [place];  Service: Orthopedics;  Laterality: Left;  . TRANSTHORACIC ECHOCARDIOGRAM  11/08/2005   EF 68%, normal LV systolic function  . UPPER GASTROINTESTINAL ENDOSCOPY  2010   Negative, Dr.[name]        Home Medications    Prior to Admission  medications   Medication Sig Start Date End Date Taking? Authorizing Provider  aspirin EC 81 MG tablet Take 81 mg by mouth daily.   Yes [provider]  b complex vitamins tablet Take 2 tablets by mouth daily.    Yes [provider]  celecoxib (CELEBREX) 200 MG capsule Take 200 mg by mouth daily.  10/24/18  Yes [provider]  diclofenac sodium (VOLTAREN) 1 % GEL Apply 2 g topically 3 (three) times daily as needed (pain).  10/22/18  Yes [provider]  fluticasone (FLONASE) 50 MCG/ACT nasal spray Place 1 spray into both nostrils daily.  10/31/18  Yes [provider]  hydrochlorothiazide (HYDRODIURIL) 25 MG tablet Take 1 tablet (25 mg total) by mouth daily. 02/01/12  Yes Hendricks Limes, MD  levothyroxine (SYNTHROID, LEVOTHROID) 125 MCG tablet Take 250 mcg by mouth daily before breakfast.   Yes [provider]  losartan (COZAAR) 50 MG tablet Take 50 mg by mouth daily.   Yes [provider]  Multiple Vitamin (MULTIVITAMIN) tablet Take 1 tablet by mouth every morning.    Yes [provider]  Omega-3 Fatty Acids (FISH OIL) 1000 MG CAPS Take 1 capsule by mouth.   Yes [provider]  omeprazole (PRILOSEC) 20 MG capsule Take 40 mg by mouth daily.    Yes [provider]  Pitavastatin Calcium (LIVALO) 2 MG TABS Take 2 mg by mouth at bedtime.   Yes [provider]  vitamin C (ASCORBIC ACID) 500 MG tablet Take 1,000 mg by mouth daily.   Yes [provider]  albuterol (PROVENTIL HFA;VENTOLIN HFA) 108 (90 Base) MCG/ACT inhaler Inhale 2 puffs into the lungs every 6 (six) hours as needed for wheezing or shortness of breath.    [provider]  docusate sodium (COLACE) 100 MG capsule Take 1 capsule (100 mg total) by mouth 2 (two) times daily. Patient not taking: Reported on 11/05/2018 12/20/16   Danae Orleans, PA-C  ferrous sulfate 325 (65 FE) MG tablet Take 1 tablet (325 mg total) by mouth 3  (three) times daily after meals. Patient not taking: Reported on 11/05/2018 12/20/16   Danae Orleans, PA-C  HYDROcodone-acetaminophen (NORCO) 7.5-325 MG tablet Take 1-2 tablets by mouth every 4 (four) hours as needed for moderate pain. Patient not taking: Reported on 11/05/2018 12/20/16   Danae Orleans, PA-C  methocarbamol (ROBAXIN) 500 MG tablet Take 1 tablet (500 mg total) by mouth every 6 (six) hours as needed for muscle spasms. Patient not taking: Reported on 11/05/2018 12/20/16   Danae Orleans, PA-C  polyethylene glycol (MIRALAX / Floria Raveling) packet Take 17 g by mouth 2 (two) times daily. Patient not taking: Reported on 11/05/2018 12/20/16   Danae Orleans, PA-C  Family History Family History  Problem Relation Age of Onset  . Cirrhosis Father   . Diabetes Father   . Stroke Father   . Heart failure Mother   . Hypertension Mother   . Thyroid disease Mother   . Heart attack Brother 22  . Aneurysm Maternal Aunt         AAA  . Coronary artery disease Maternal Uncle        MI late 65s    Social History Social History   Tobacco Use  . Smoking status: Never Smoker  . Smokeless tobacco: Never Used  . Tobacco comment: Quit at age 7  Substance Use Topics  . 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 Drug use: No     Allergies   Dilaudid [hydromorphone hcl] and Benazepril hcl   Review of Systems Review of Systems  All other systems reviewed and are negative.    Physical Exam Updated Vital Signs BP 114/71   Pulse 63   Temp 97.9 F (36.6 C) (Oral)   Resp 18   Wt 131.1 kg   SpO2 97%   BMI 41.47 kg/m   Physical Exam  Constitutional: He appears well-developed and well-nourished. No distress.  HENT:  Head: Normocephalic and atraumatic.  Right Ear: External ear normal.  Left Ear: External ear normal.  Eyes: Conjunctivae are normal. Right eye exhibits no discharge. Left eye exhibits no discharge. No scleral icterus.  Neck: Neck supple. No  tracheal deviation present.  Cardiovascular: Normal rate, regular rhythm and intact distal pulses.  Pulmonary/Chest: Effort normal and breath sounds normal. No stridor. No respiratory distress. He has no wheezes. He has no rales.  Abdominal: Soft. Bowel sounds are normal. He exhibits no distension. There is no tenderness. There is no rebound and no guarding.  Musculoskeletal: He exhibits no edema.       Right hip: He exhibits tenderness.  Shortened rle, normal distal sensation, no cyanosis. Pt is able to wiggle toes  Neurological: He is alert. He has normal strength. No cranial nerve deficit (no facial droop, extraocular movements intact, no slurred speech) or sensory deficit. He exhibits normal muscle tone. He displays no seizure activity. Coordination normal.  Skin: Skin is warm and dry. No rash noted.  Psychiatric: He has a normal mood and affect.  Nursing note and vitals reviewed.    ED Treatments / Results  Labs (all labs ordered are listed, but only abnormal results are displayed) Labs Reviewed  CBC - Abnormal; Notable for the following components:      Result Value   Hemoglobin 12.7 (*)    HCT 37.9 (*)    All other components within normal limits  BASIC METABOLIC PANEL - Abnormal; Notable for the following components:   Calcium 8.3 (*)    All other components within normal limits  PROTIME-INR    EKG EKG Interpretation  Date/Time:  Tuesday November 05 2018 20:15:29 EST Ventricular Rate:  67 PR Interval:    QRS Duration: 104 QT Interval:  423 QTC Calculation: 447 R Axis:   32 Text Interpretation:  Sinus rhythm No significant change since last tracing Confirmed by Dorie Rank [phone]) on 11/05/2018 8:33:56 PM   Radiology Dg Hip Unilat  With Pelvis 2-3 Views Right  Result Date: 11/05/2018 CLINICAL DATA:  Fall EXAM: DG HIP (WITH OR WITHOUT PELVIS) 2-3V RIGHT COMPARISON:  02/05/2016 FINDINGS: Right hip replacement. Fracture of the femoral prosthesis at the femoral neck  level. Mild  angulation. Acetabulum in good position. No bony fracture Left hip replacement in satisfactory position IMPRESSION: Fracture of right femoral prosthesis Electronically Signed   By: Franchot Gallo M.D.   On: 11/05/2018 18:55    Procedures Procedures (including critical care time)  Medications Ordered in ED Medications  morphine 4 MG/ML injection 4 mg (has no administration in time range)  morphine 4 MG/ML injection 4 mg (4 mg Intravenous Given 11/05/18 1806)  ondansetron (ZOFRAN) injection 4 mg (4 mg Intravenous Given 11/05/18 1806)     Initial Impression / Assessment and Plan / ED Course  I have reviewed the triage vital signs and the nursing notes.  Pertinent labs & imaging results that were available during my care of the patient were reviewed by me and considered in my medical decision making (see chart for details).  Clinical Course as of Nov 05 1920  Tue Nov 05, 2018  1918 Xray shows a fx through the hip prosthesis.  I spoke with Dr Gladstone Lighter.  He will see the patient.  Admit to hospitalist service.   [JK]    Clinical Course User Index [JK] Dorie Rank, MD    Pt presents with acute hip pain while walking.  Sx were concerning for hip dislocation considering the spontaneous nature of the pain while walking.  Surprisingly the xray shows a fracture of the prosthesis itself.  I spoke with orthopedics, Dr Gladstone Lighter.  I will consult the medical service for admission.  Final Clinical Impressions(s) / ED Diagnoses   Final diagnoses:  Fracture of prosthetic hip, initial encounter [facility] 2)      Dorie Rank, MD 11/05/18 1923 EKG interp   Dorie Rank, MD 11/05/18 2034

## 2018-11-05 NOTE — ED Notes (Signed)
Bed: WA21 Expected date:  Expected time:  Means of arrival:  Comments: EMS-possible hip deformity

## 2018-11-06 DIAGNOSIS — D649 Anemia, unspecified: Secondary | ICD-10-CM

## 2018-11-06 DIAGNOSIS — J45909 Unspecified asthma, uncomplicated: Secondary | ICD-10-CM

## 2018-11-06 HISTORY — DX: Unspecified asthma, uncomplicated: J45.909

## 2018-11-06 LAB — HIV ANTIBODY (ROUTINE TESTING W REFLEX): HIV SCREEN 4TH GENERATION: NONREACTIVE

## 2018-11-06 MED ORDER — ONDANSETRON HCL 4 MG/2ML IJ SOLN
4.0000 mg | Freq: Four times a day (QID) | INTRAMUSCULAR | Status: DC | PRN
  Administered 2018-11-06 – 2018-11-12 (×3): 4 mg via INTRAVENOUS
  Filled 2018-11-06 (×2): qty 2

## 2018-11-06 MED ORDER — ACETAMINOPHEN 325 MG PO TABS
650.0000 mg | ORAL_TABLET | Freq: Four times a day (QID) | ORAL | Status: DC | PRN
  Administered 2018-11-17: 650 mg via ORAL
  Filled 2018-11-06 (×2): qty 2

## 2018-11-06 MED ORDER — ONDANSETRON HCL 4 MG/2ML IJ SOLN
INTRAMUSCULAR | Status: AC
  Filled 2018-11-06: qty 2

## 2018-11-06 MED ORDER — HYDROCODONE-ACETAMINOPHEN 7.5-325 MG PO TABS
1.0000 | ORAL_TABLET | Freq: Four times a day (QID) | ORAL | Status: DC | PRN
  Administered 2018-11-08 – 2018-11-11 (×9): 1 via ORAL
  Filled 2018-11-06 (×9): qty 1

## 2018-11-06 MED ORDER — ENOXAPARIN SODIUM 40 MG/0.4ML ~~LOC~~ SOLN
40.0000 mg | SUBCUTANEOUS | Status: AC
  Administered 2018-11-06 – 2018-11-10 (×5): 40 mg via SUBCUTANEOUS
  Filled 2018-11-06 (×5): qty 0.4

## 2018-11-06 MED ORDER — PRAVASTATIN SODIUM 20 MG PO TABS
40.0000 mg | ORAL_TABLET | Freq: Every day | ORAL | Status: DC
  Administered 2018-11-06 – 2018-11-16 (×10): 40 mg via ORAL
  Filled 2018-11-06 (×10): qty 2

## 2018-11-06 MED ORDER — FLUTICASONE PROPIONATE 50 MCG/ACT NA SUSP
1.0000 | Freq: Every day | NASAL | Status: DC
  Administered 2018-11-06 – 2018-11-17 (×11): 1 via NASAL
  Filled 2018-11-06 (×2): qty 16

## 2018-11-06 MED ORDER — MORPHINE SULFATE (PF) 2 MG/ML IV SOLN
2.0000 mg | INTRAVENOUS | Status: DC | PRN
  Administered 2018-11-06 (×3): 4 mg via INTRAVENOUS
  Administered 2018-11-07 – 2018-11-08 (×8): 2 mg via INTRAVENOUS
  Administered 2018-11-08: 3 mg via INTRAVENOUS
  Administered 2018-11-08 – 2018-11-09 (×4): 2 mg via INTRAVENOUS
  Administered 2018-11-09: 3 mg via INTRAVENOUS
  Administered 2018-11-09 – 2018-11-11 (×10): 2 mg via INTRAVENOUS
  Filled 2018-11-06 (×5): qty 1
  Filled 2018-11-06: qty 2
  Filled 2018-11-06 (×3): qty 1
  Filled 2018-11-06: qty 2
  Filled 2018-11-06 (×3): qty 1
  Filled 2018-11-06: qty 2
  Filled 2018-11-06: qty 1
  Filled 2018-11-06: qty 2
  Filled 2018-11-06 (×2): qty 1
  Filled 2018-11-06: qty 2
  Filled 2018-11-06 (×3): qty 1
  Filled 2018-11-06: qty 2
  Filled 2018-11-06: qty 1
  Filled 2018-11-06: qty 2
  Filled 2018-11-06 (×2): qty 1

## 2018-11-06 MED ORDER — SODIUM CHLORIDE 0.9 % IV SOLN
INTRAVENOUS | Status: DC
  Administered 2018-11-06 (×2): via INTRAVENOUS

## 2018-11-06 MED ORDER — LEVOTHYROXINE SODIUM 125 MCG PO TABS
250.0000 ug | ORAL_TABLET | Freq: Every day | ORAL | Status: DC
  Administered 2018-11-06 – 2018-11-17 (×12): 250 ug via ORAL
  Filled 2018-11-06 (×12): qty 2

## 2018-11-06 MED ORDER — PANTOPRAZOLE SODIUM 40 MG PO TBEC
40.0000 mg | DELAYED_RELEASE_TABLET | Freq: Every day | ORAL | Status: DC
  Administered 2018-11-06 – 2018-11-16 (×10): 40 mg via ORAL
  Filled 2018-11-06 (×11): qty 1

## 2018-11-06 MED ORDER — ALBUTEROL SULFATE (2.5 MG/3ML) 0.083% IN NEBU: 3.0000 mL | INHALATION_SOLUTION | Freq: Four times a day (QID) | RESPIRATORY_TRACT | Status: DC | PRN

## 2018-11-06 NOTE — Progress Notes (Signed)
[facility] PROGRESS NOTE  Samuel Goodwin WUJ:[id number] DOB: [birth year] DOA: 11/05/2018  PCP: [name], MD  Brief History/Interval Summary: 57 y.o. male with medical history significant for alcohol abuse, anxiety, depression, GERD, hep C s/p IFN in 1994, hypertension, hyperlipidemia, hypothyroidism, OSA presented to the hospital for evaluation of right hip pain. Patient stated he [place] walks at the park 4 miles daily.  When he was walking all of a sudden he heard a loud pop and sudden onset right hip pain which caused him to fall on the ground.  Evaluation showed fracture of the right femoral prosthesis.  Orthopedic was consulted.  Patient was hospitalized for further management.  Reason for Visit: Right-sided femoral prosthesis fracture  Consultants: Orthopedics  Procedures: None yet  Antibiotics: None  Subjective/Interval History: Patient continues to have significant pain and discomfort in the right lower extremity.  Pain is 7-8 out of 10 in intensity when he moves it.  He received pain medications a little while ago so he is comfortable.  Denies any chest pain or shortness of breath.  ROS: Denies any nausea or vomiting  Objective:  Vital Signs  Vitals:   11/06/18 0342 11/06/18 0507 11/06/18 0621 11/06/18 1059  BP: 116/76 122/84 126/88   Pulse: (!) 59 63 61   Resp: 14 14 16    Temp: 97.8 F (36.6 C) 97.6 F (36.4 C) (!) 97.4 F (36.3 C)   TempSrc: Oral Oral Oral   SpO2: 98% 94% 93%   Weight:    131.1 kg  Height:    6' (1.829 m)    Intake/Output Summary (Last 24 hours) at 11/06/2018 1255 Last data filed at 11/06/2018 1209 Gross per 24 hour  Intake 656.87 ml  Output 700 ml  Net -43.13 ml   Filed Weights   11/05/18 1743 11/05/18 2243 11/06/18 1059  Weight: 131.1 kg 131.1 kg 131.1 kg    General appearance: alert, cooperative, appears stated age and no distress Head: Normocephalic, without obvious abnormality, atraumatic Resp: clear to  auscultation bilaterally Cardio: regular rate and rhythm, S1, S2 normal, no murmur, click, rub or gallop GI: soft, non-tender; bowel sounds normal; no masses,  no organomegaly Extremities: No obvious edema.  Right lower extremity is immobilized Pulses: 2+ and symmetric Neurologic: No obvious neurological deficits.  Lab Results:  Data Reviewed: I have personally reviewed following labs and imaging studies  CBC: Recent Labs  Lab 11/05/18 1751  WBC 7.6  HGB 12.7*  HCT 37.9*  MCV 89.0  PLT 956    Basic Metabolic Panel: Recent Labs  Lab 11/05/18 1751  NA 138  K 3.9  CL 103  CO2 26  GLUCOSE 99  BUN 20  CREATININE 0.81  CALCIUM 8.3*    GFR: Estimated Creatinine Clearance: 142.6 mL/min (by C-G formula based on SCr of 0.81 mg/dL).  Coagulation Profile: Recent Labs  Lab 11/05/18 1919  INR 0.92     Radiology Studies: Dg Chest Portable 1 View  Result Date: 11/05/2018 CLINICAL DATA:  Preop for hip surgery. EXAM: PORTABLE CHEST 1 VIEW COMPARISON:  Radiographs of July 05, 2013. FINDINGS: Stable cardiomegaly. Both lungs are clear. The visualized skeletal structures are unremarkable. IMPRESSION: No active disease. Electronically Signed   By: Marijo Conception, M.D.   On: 11/05/2018 19:47   Dg Hip Unilat  With Pelvis 2-3 Views Right  Result Date: 11/05/2018 CLINICAL DATA:  Fall EXAM: DG HIP (WITH OR WITHOUT PELVIS) 2-3V RIGHT COMPARISON:  02/05/2016 FINDINGS: Right hip replacement. Fracture of the  femoral prosthesis at the femoral neck level. Mild angulation. Acetabulum in good position. No bony fracture Left hip replacement in satisfactory position IMPRESSION: Fracture of right femoral prosthesis Electronically Signed   By: Franchot Gallo M.D.   On: 11/05/2018 18:55     Medications:  Scheduled: . fluticasone  1 spray Each Nare Daily  . levothyroxine  250 mcg Oral QAC breakfast  . ondansetron      . pantoprazole  40 mg Oral Daily  . pravastatin  40 mg Oral q1800    Continuous: . sodium chloride 150 mL/hr at 11/06/18 1209   YKD:[id number], albuterol, HYDROcodone-acetaminophen, morphine injection, ondansetron (ZOFRAN) IV    Assessment/Plan:  Fracture of the right femoral prosthesis Orthopedics is following.  Pain control.  Bowel regimen.  Unfortunately it looks like orthopedics will not be able to operate until Monday due to the Thanksgiving holiday.  They have recommended that PT and OT work with the patient to see if he will be able to mobilize on his own.  Diet to be reinitiated.  We will place him on DVT prophylaxis for now.  Essential hypertension Blood pressure reasonably well controlled.  Holding his hydrochlorothiazide and losartan.  Normocytic anemia Hemoglobin is stable.  No evidence of overt bleeding.  History of reactive airway disease Currently stable.  No wheezing appreciated.  History of hypothyroidism Continue home dose of Synthroid.  History of GERD PPI  DVT Prophylaxis: Currently on SCDs.  We will initiate Lovenox.    Code Status: Full code Family Communication: Discussed with the patient Disposition Plan: Management as outlined above.    LOS: 1 day   [facility] Pager [phone] 11/06/2018, 12:55 PM  If 7PM-7AM, please contact night-coverage at [email or web address], password [facility]

## 2018-11-06 NOTE — Evaluation (Signed)
Physical Therapy Evaluation Patient Details Name: Samuel Goodwin MRN: [id number] DOB: 1961/06/17 Today's Date: 11/06/2018   History of Present Illness  57 yo male admitted 11/26 for fracture of R femoral prosthesis at femoral neck. NWB RLE. Pt scheduled for R THA revision on 11/11/18. PMH includes alcohol abuse, anxiety, OA, vertigo, depression, cancer, hep C, HLD, HTN, OSA, PTSD, L THA 2018, R THA 2017 with attempted reduction after dislocation and subsequent revision, HLD, L TKR 2015.  Clinical Impression   Pt presents with severe R hip and LE pain, intolerance to any mobility including PROM, and RLE ROM deficits due to pain. Pt to benefit from acute PT to address deficits. Pt unable to participate in any mobility this session due to R hip pain. Pt complaining of spasm-type pain, RN notified. Pt and pt's wife adamant that they do not want pt to d/c until surgery. PT to progress mobility as tolerated, and will continue to follow acutely.      Follow Up Recommendations Follow surgeon's recommendation for DC plan and follow-up therapies;Supervision for mobility/OOB    Equipment Recommendations  None recommended by PT    Recommendations for Other Services       Precautions / Restrictions Precautions Precautions: Fall Restrictions Weight Bearing Restrictions: Yes RUE Weight Bearing: Non weight bearing      Mobility  Bed Mobility Overal bed mobility: (Mobility not tolerated by pt this session, PROM of RLE only)                Transfers                    Ambulation/Gait                Stairs            Wheelchair Mobility    Modified Rankin (Stroke Patients Only)       Balance Overall balance assessment: (unable to assess due to pt's high pain level and inability to assist in bed mobility)                                           Pertinent Vitals/Pain Pain Assessment: Faces Faces Pain Scale: Hurts whole lot Pain  Location: R hip and RLE Pain Descriptors / Indicators: Spasm;Squeezing;Tightness Pain Intervention(s): Limited activity within patient's tolerance;Repositioned;Monitored during session;Premedicated before session    Home Living Family/patient expects to be discharged to:: Private residence Living Arrangements: Spouse/significant other;Children Available Help at Discharge: Family;Available PRN/intermittently Type of Home: House Home Access: Stairs to enter Entrance Stairs-Rails: None Entrance Stairs-Number of Steps: 1 Home Layout: One level Home Equipment: Shower seat;Bedside commode;Walker - 2 wheels;Cane - single point Additional Comments: Pt unable to navigate bathroom with use of AD.     Prior Function Level of Independence: Independent               Hand Dominance   Dominant Hand: Right    Extremity/Trunk Assessment   Upper Extremity Assessment Upper Extremity Assessment: Overall WFL for tasks assessed    Lower Extremity Assessment Lower Extremity Assessment: RLE deficits/detail;LLE deficits/detail RLE Deficits / Details: PROM only due to pt pain, unable to perform quad set RLE: Unable to fully assess due to pain LLE Deficits / Details: able to perform full AROM hip and knee flexion/extension       Communication   Communication: No difficulties  Cognition Arousal/Alertness: Awake/alert  Behavior During Therapy: WFL for tasks assessed/performed Overall Cognitive Status: Within Functional Limits for tasks assessed                                        General Comments      Exercises Total Joint Exercises Knee Flexion: PROM;Right;Other reps (comment);Supine(hip and knee flexion, for 10 minutes to decrease stiffness and increase mobility. Pt with vocalizations due to pain, unable to actively assist ) Goniometric ROM: R hip PROM ~10-45* limited by pain, R knee PROM ~5-35* limited by pain.    Assessment/Plan    PT Assessment Patient needs  continued PT services  PT Problem List Decreased strength;Pain;Decreased range of motion;Decreased activity tolerance;Decreased knowledge of use of DME;Decreased balance;Decreased safety awareness;Decreased mobility;Decreased knowledge of precautions       PT Treatment Interventions DME instruction;Therapeutic activities;Gait training;Therapeutic exercise;Patient/family education;Stair training;Balance training;Functional mobility training    PT Goals (Current goals can be found in the Care Plan section)  Acute Rehab PT Goals Patient Stated Goal: decrease pain  PT Goal Formulation: With patient Time For Goal Achievement: 11/20/18 Potential to Achieve Goals: Good    Frequency Min 5X/week   Barriers to discharge        Co-evaluation               AM-PAC PT "6 Clicks" Mobility  Outcome Measure Help needed turning from your back to your side while in a flat bed without using bedrails?: A Lot Help needed moving from lying on your back to sitting on the side of a flat bed without using bedrails?: Total Help needed moving to and from a bed to a chair (including a wheelchair)?: Total Help needed standing up from a chair using your arms (e.g., wheelchair or bedside chair)?: Total Help needed to walk in hospital room?: Total Help needed climbing 3-5 steps with a railing? : Total 6 Click Score: 7    End of Session   Activity Tolerance: Patient limited by pain Patient left: in bed;with bed alarm set;with call bell/phone within reach;with family/visitor present;with SCD's reapplied Nurse Communication: Mobility status;Patient requests pain meds PT Visit Diagnosis: Other abnormalities of gait and mobility (R26.89);Pain Pain - Right/Left: Right Pain - part of body: Hip    Time: 1430-1450 PT Time Calculation (min) (ACUTE ONLY): 20 min   Charges:   PT Evaluation $PT Eval Low Complexity: 1 Low         Samuel Goodwin, PT Acute Rehabilitation Services Pager [phone]  Office  [phone]  Samuel Goodwin 11/06/2018, 3:38 PM

## 2018-11-06 NOTE — H&P (Signed)
History and Physical    Samuel Goodwin YTK:[id number] DOB: [birth year] DOA: 11/05/2018  PCP: [name], MD Patient coming from: Home  Chief Complaint: Right hip pain  HPI: Samuel Goodwin is a 57 y.o. male with medical history significant of alcohol abuse, anxiety, depression, GERD, hep C s/p IFN in 1994, hypertension, hyperlipidemia, hypothyroidism, OSA resenting to the hospital for evaluation of right hip pain.  Patient states he normally walks at the park 4 miles daily.  When he was walking today all of a sudden he heard a loud pop and sudden onset right hip pain which caused him to fall on the ground.  Denies hitting his head.  A bystander came to help him and called EMS.   ED Course: Afebrile and hemodynamically stable.  No leukocytosis.  Chest x-ray showing no active disease.  EKG not suggestive of ACS.  X-ray of right hip and pelvis showing fracture of right femoral prosthesis.  He was seen by Dr. Gladstone Lighter from orthopedics who recommended hospitalist admission for revision surgery by Dr. Alvan Dame.  Review of Systems: As per HPI otherwise 10 point review of systems negative.  Past Medical History:  Diagnosis Date  . Alcohol abuse    Sobriety since 1990  . Anxiety   . Arthritis    oa  . Benign positional vertigo   . 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 Depression   . Eardrum trauma    30 years ago/right ear  . GERD (gastroesophageal reflux disease)   . Headache    sinus  . Hepatitis C 1994   hep c - ? etiology; in [place] 1989-91; S/P interferon , Dr [name]  . History of bronchitis   . Hoarseness   . Hyperlipidemia   . Hypertension   . Hypothyroidism   . OSA (obstructive sleep apnea)    on CPAP  . PONV (postoperative nausea and vomiting)    following knee surgery last year   . PTSD (post-traumatic stress disorder)   . Vocal cord polyps     Past Surgical History:  Procedure Laterality Date  . ANTERIOR HIP REVISION Right 02/07/2016   Procedure: ANTERIOR HIP  REVISION;  Surgeon: Paralee Cancel, MD;  Location: [facility];  Service: Orthopedics;  Laterality: Right;  . CARDIOVASCULAR STRESS TEST  02/09/2010   No scintigraphic evidence of inducible ischemia.  [name] HIP CLOSED REDUCTION Right 02/05/2016   Procedure: CLOSED MANIPULATION HIP;  Surgeon: Latanya Maudlin, MD;  Location: [facility];  Service: Orthopedics;  Laterality: Right;  . knee arthroscopic Bilateral   . LARYNGOSCOPY  08/2009   Dr.[name]  . right knee arthroscopic knee surgery  12 yrs ago   dr Theda Sers  . THROAT SURGERY  aug, sept, Nov 16 2016   vocal cord  laser sugery X 3, Dr [name] , [facility]  . TOTAL HIP ARTHROPLASTY Right 02/01/2016   Procedure: RIGHT TOTAL HIP ARTHROPLASTY ANTERIOR APPROACH;  Surgeon: Paralee Cancel, MD;  Location: [facility];  Service: Orthopedics;  Laterality: Right;  . TOTAL HIP ARTHROPLASTY Left 12/19/2016   Procedure: LEFT TOTAL HIP ARTHROPLASTY ANTERIOR APPROACH;  Surgeon: Paralee Cancel, MD;  Location: [facility];  Service: Orthopedics;  Laterality: Left;  . TOTAL KNEE ARTHROPLASTY Left 11/27/2014   dr [name]  . TOTAL KNEE ARTHROPLASTY Left 11/27/2014   Procedure: LEFT TOTAL KNEE ARTHROPLASTY;  Surgeon: Augustin Schooling, MD;  Location: [facility];  Service: Orthopedics;  Laterality: Left;  . TRANSTHORACIC ECHOCARDIOGRAM  11/08/2005   EF 68%, normal LV systolic function  .  UPPER GASTROINTESTINAL ENDOSCOPY  2010   Negative, Dr.[name]     reports that he has never smoked. He has never used smokeless tobacco. He reports that he does not drink alcohol or use drugs.  Allergies  Allergen Reactions  . Dilaudid [Hydromorphone Hcl] Nausea And Vomiting    "Questionable as to whether it was actually dilaudid  That made me so severely nauseous and vomit"  . Benazepril Hcl Cough    Family History  Problem Relation Age of Onset  . Cirrhosis Father   . Diabetes Father   . Stroke Father   . Heart failure Mother   . Hypertension Mother   . Thyroid disease Mother   . Heart attack Brother 58  .  Aneurysm Maternal Aunt         AAA  . Coronary artery disease Maternal Uncle        MI late 58s    Prior to Admission medications   Medication Sig Start Date End Date Taking? Authorizing Provider  aspirin EC 81 MG tablet Take 81 mg by mouth daily.   Yes [provider]  b complex vitamins tablet Take 2 tablets by mouth daily.    Yes [provider]  celecoxib (CELEBREX) 200 MG capsule Take 200 mg by mouth daily.  10/24/18  Yes [provider]  diclofenac sodium (VOLTAREN) 1 % GEL Apply 2 g topically 3 (three) times daily as needed (pain).  10/22/18  Yes [provider]  fluticasone (FLONASE) 50 MCG/ACT nasal spray Place 1 spray into both nostrils daily.  10/31/18  Yes [provider]  hydrochlorothiazide (HYDRODIURIL) 25 MG tablet Take 1 tablet (25 mg total) by mouth daily. 02/01/12  Yes Hendricks Limes, MD  levothyroxine (SYNTHROID, LEVOTHROID) 125 MCG tablet Take 250 mcg by mouth daily before breakfast.   Yes [provider]  losartan (COZAAR) 50 MG tablet Take 50 mg by mouth daily.   Yes [provider]  Multiple Vitamin (MULTIVITAMIN) tablet Take 1 tablet by mouth every morning.    Yes [provider]  Omega-3 Fatty Acids (FISH OIL) 1000 MG CAPS Take 1 capsule by mouth.   Yes [provider]  omeprazole (PRILOSEC) 20 MG capsule Take 40 mg by mouth daily.    Yes [provider]  Pitavastatin Calcium (LIVALO) 2 MG TABS Take 2 mg by mouth at bedtime.   Yes [provider]  vitamin C (ASCORBIC ACID) 500 MG tablet Take 1,000 mg by mouth daily.   Yes [provider]  albuterol (PROVENTIL HFA;VENTOLIN HFA) 108 (90 Base) MCG/ACT inhaler Inhale 2 puffs into the lungs every 6 (six) hours as needed for wheezing or shortness of breath.    [provider]  docusate sodium (COLACE) 100 MG capsule Take 1 capsule (100 mg total) by mouth 2 (two) times daily. Patient not taking: Reported on  11/05/2018 12/20/16   Danae Orleans, PA-C  ferrous sulfate 325 (65 FE) MG tablet Take 1 tablet (325 mg total) by mouth 3 (three) times daily after meals. Patient not taking: Reported on 11/05/2018 12/20/16   Danae Orleans, PA-C  HYDROcodone-acetaminophen (NORCO) 7.5-325 MG tablet Take 1-2 tablets by mouth every 4 (four) hours as needed for moderate pain. Patient not taking: Reported on 11/05/2018 12/20/16   Danae Orleans, PA-C  methocarbamol (ROBAXIN) 500 MG tablet Take 1 tablet (500 mg total) by mouth every 6 (six) hours as needed for muscle spasms. Patient not taking: Reported on 11/05/2018 12/20/16   Danae Orleans, PA-C  polyethylene glycol (MIRALAX / GLYCOLAX) packet Take 17 g by mouth 2 (two) times daily. Patient not taking: Reported on 11/05/2018 12/20/16   Danae Orleans, PA-C    Physical Exam: Vitals:   11/05/18 2100 11/05/18 2130 11/05/18 2200 11/05/18 2243  BP: (!) 119/58 (!) 154/90 129/89 121/83  Pulse: 68 70 63 60  Resp: 16 15 12 14   Temp:    97.8 F (36.6 C)  TempSrc:    Oral  SpO2: 96% 98% 95% 95%  Weight:    131.1 kg    Physical Exam  Constitutional: He is oriented to person, place, and time. He appears well-developed and well-nourished. No distress.  HENT:  Head: Normocephalic.  Mouth/Throat: Oropharynx is clear and moist.  Eyes: Right eye exhibits no discharge. Left eye exhibits no discharge.  Neck: Neck supple. No tracheal deviation present.  Cardiovascular: Normal rate, regular rhythm and intact distal pulses.  Pulmonary/Chest: Effort normal. He has no wheezes. He has no rales.  Anterior lung fields clear to auscultation  Abdominal: Soft. Bowel sounds are normal. He exhibits no distension. There is no tenderness. There is no guarding.  Musculoskeletal: He exhibits no edema.  Right lower extremity externally rotated.  Dorsalis pedis pulse intact.  Sensation intact.  Neurological: He is alert and oriented to person, place, and time.  Skin: Skin is warm and  dry. He is not diaphoretic.  Psychiatric: He has a normal mood and affect. His behavior is normal.     Labs on Admission: I have personally reviewed following labs and imaging studies  CBC: Recent Labs  Lab 11/05/18 1751  WBC 7.6  HGB 12.7*  HCT 37.9*  MCV 89.0  PLT 782   Basic Metabolic Panel: Recent Labs  Lab 11/05/18 1751  NA 138  K 3.9  CL 103  CO2 26  GLUCOSE 99  BUN 20  CREATININE 0.81  CALCIUM 8.3*   GFR: CrCl cannot be calculated (Unknown ideal weight.). Liver Function Tests: No results for input(s): AST, ALT, ALKPHOS, BILITOT, PROT, ALBUMIN in the last 168 hours. No results for input(s): LIPASE, AMYLASE in the last 168 hours. No results for input(s): AMMONIA in the last 168 hours. Coagulation Profile: Recent Labs  Lab 11/05/18 1919  INR 0.92   Cardiac Enzymes: No results for input(s): CKTOTAL, CKMB, CKMBINDEX, TROPONINI in the last 168 hours. BNP (last 3 results) No results for input(s): PROBNP in the last 8760 hours. HbA1C: No results for input(s): HGBA1C in the last 72 hours. CBG: No results for input(s): GLUCAP in the last 168 hours. Lipid Profile: No results for input(s): CHOL, HDL, LDLCALC, TRIG, CHOLHDL, LDLDIRECT in the last 72 hours. Thyroid Function Tests: No results for input(s): TSH, T4TOTAL, FREET4, T3FREE, THYROIDAB in the last 72 hours. Anemia Panel: No results for input(s): VITAMINB12, FOLATE, FERRITIN, TIBC, IRON, RETICCTPCT in the last 72 hours. Urine analysis:    Component Value Date/Time   COLORURINE YELLOW 01/20/2016 1400   APPEARANCEUR CLEAR 01/20/2016 1400   LABSPEC 1.026 01/20/2016 1400   PHURINE 7.5 01/20/2016 1400   GLUCOSEU NEGATIVE 01/20/2016 1400   HGBUR NEGATIVE 01/20/2016 1400   HGBUR negative 01/03/2010 0918   BILIRUBINUR NEGATIVE 01/20/2016 1400   KETONESUR NEGATIVE 01/20/2016 1400   PROTEINUR NEGATIVE 01/20/2016 1400   UROBILINOGEN 0.2 01/03/2010 0918   NITRITE NEGATIVE 01/20/2016 1400   LEUKOCYTESUR  NEGATIVE 01/20/2016 1400    Radiological Exams on Admission: Dg Chest Portable 1 View  Result Date: 11/05/2018 CLINICAL DATA:  Preop for hip surgery. EXAM: PORTABLE  CHEST 1 VIEW COMPARISON:  Radiographs of July 05, 2013. FINDINGS: Stable cardiomegaly. Both lungs are clear. The visualized skeletal structures are unremarkable. IMPRESSION: No active disease. Electronically Signed   By: Marijo Conception, M.D.   On: 11/05/2018 19:47   Dg Hip Unilat  With Pelvis 2-3 Views Right  Result Date: 11/05/2018 CLINICAL DATA:  Fall EXAM: DG HIP (WITH OR WITHOUT PELVIS) 2-3V RIGHT COMPARISON:  02/05/2016 FINDINGS: Right hip replacement. Fracture of the femoral prosthesis at the femoral neck level. Mild angulation. Acetabulum in good position. No bony fracture Left hip replacement in satisfactory position IMPRESSION: Fracture of right femoral prosthesis Electronically Signed   By: Franchot Gallo M.D.   On: 11/05/2018 18:55    EKG: Independently reviewed.  Sinus rhythm, heart rate 67.  Assessment/Plan Principal Problem:   Femoral fracture (HCC) Active Problems:   Hypothyroidism   HTN (hypertension)   GERD (gastroesophageal reflux disease)   Chronic anemia   Reactive airway disease   Femoral prosthesis fracture Patient experienced sudden onset popping sensation and pain in his right hip while walking today.  X-ray of right hip and pelvis showing fracture of right femoral prosthesis.  Currently neurovascularly intact.  He was seen by Dr. Gladstone Lighter from orthopedics who recommended hospitalist admission for revision surgery by Dr. Alvan Dame. -Keep n.p.o. except sips with meds -IV fluid hydration -Pain management: Dilaudid 0.5-1 mg every 3 hours as needed, Norco 7.5-325 mg 1 tablet every 6 hours as needed, Tylenol as needed -Bedrest -Nonweightbearing -Orthopedics following; appreciate recs  Hypertension Currently normotensive.  Hold home hydrochlorothiazide and losartan at this time.  Chronic  anemia -Stable.  Hemoglobin 12.7, above baseline.    Reactive airway disease -Stable.  No bronchospasm.  Continue home inhaler.  Hypothyroidism -Continue home Synthroid  GERD -Continue PPI  DVT prophylaxis: SCD (left lower extremity.  Hold anticoagulation until after surgery. Code Status: Patient wishes to be full code. Family Communication: No family available Disposition Plan: Anticipate discharge to home in 2 to 3 days. Consults called: Orthopedics (Dr. Matilde Haymaker) Admission status: It is my clinical opinion that admission to INPATIENT is reasonable and necessary in this 57 y.o. male . presenting with symptoms of acute onset right hip pain, concerning for femoral prosthesis fracture . in the context of PMH including: Right femoral prosthesis . with pertinent positives on physical exam including: Right hip pain . and pertinent positives on radiographic and laboratory data including: X-ray with evidence of right femoral prosthesis fracture. . Workup and treatment include keep n.p.o., IV fluid hydration, IV pain management, and patient will likely go to the OR in the morning.  Given the aforementioned, the predictability of an adverse outcome is felt to be significant. I expect that the patient will require at least 2 midnights in the hospital to treat this condition.   Shela Leff MD [facility] Pager [phone]  If 7PM-7AM, please contact night-coverage [email or web address] Password [email or web address]  11/06/2018, 1:35 AM

## 2018-11-06 NOTE — Progress Notes (Signed)
     Subjective: Fracture of the right femoral prosthesis at the femoral neck  Patient reports pain as moderate in the right leg.  Dr Alvan Dame discussed the situation described that situation with regards to surgery (explained in the Plan section below). No reported events throughout the night. PT to see to determine his functionality.   Objective:   VITALS:   Vitals:   11/06/18 0507 11/06/18 0621  BP: 122/84 126/88  Pulse: 63 61  Resp: 14 16  Temp: 97.6 F (36.4 C) (!) 97.4 F (36.3 C)  SpO2: 94% 93%    Dorsiflexion/Plantar flexion intact No cellulitis present Compartment soft  LABS Recent Labs    11/05/18 1751  HGB 12.7*  HCT 37.9*  WBC 7.6  PLT 179    Recent Labs    11/05/18 1751  NA 138  K 3.9  BUN 20  CREATININE 0.81  GLUCOSE 99     Assessment/Plan: Fracture of the right femoral prosthesis at the femoral neck     X-rays reveal: Fracture of the right femoral prosthesis at the femoral neck level. Mild angulation. Acetabulum in good position. No bony fracture.  Dr. Alvan Dame discussed with the patient that he will not able to do anything to the hip until Monday 11/11/18.  If the patient is able to safely work with PT then he could be discharged home and return on Monday for surgery.  If not safe he would have to stay in the hospital on anticoagulation until appropriate for surgery on Monday.  PT ordered to eval the patient  NWB right leg  Plan would be for a revision of the right hip arthroplasty on Monday 11/11/18.    Samuel Goodwin   PAC  11/06/2018, 10:09 AM

## 2018-11-07 LAB — BASIC METABOLIC PANEL
ANION GAP: 7 (ref 5–15)
BUN: 14 mg/dL (ref 6–20)
CHLORIDE: 105 mmol/L (ref 98–111)
CO2: 29 mmol/L (ref 22–32)
Calcium: 8.4 mg/dL — ABNORMAL LOW (ref 8.9–10.3)
Creatinine, Ser: 0.82 mg/dL (ref 0.61–1.24)
GFR calc Af Amer: >60 mL/min (ref >60)
GLUCOSE: 102 mg/dL — AB (ref 70–99)
POTASSIUM: 4.5 mmol/L (ref 3.5–5.1)
Sodium: 141 mmol/L (ref 135–145)

## 2018-11-07 LAB — CBC
HEMATOCRIT: 40.4 % (ref 39.0–52.0)
Hemoglobin: 13.4 g/dL (ref 13.0–17.0)
MCH: 29.6 pg (ref 26.0–34.0)
MCHC: 33.2 g/dL (ref 30.0–36.0)
MCV: 89.4 fL (ref 80.0–100.0)
NRBC: 0 % (ref 0.0–0.2)
Platelets: 199 10*3/uL (ref 150–400)
RBC: 4.52 MIL/uL (ref 4.22–5.81)
RDW: 13 % (ref 11.5–15.5)
WBC: 6.6 10*3/uL (ref 4.0–10.5)

## 2018-11-07 MED ORDER — POLYETHYLENE GLYCOL 3350 17 G PO PACK
17.0000 g | PACK | Freq: Every day | ORAL | Status: DC
  Administered 2018-11-07: 17 g via ORAL
  Filled 2018-11-07: qty 1

## 2018-11-07 MED ORDER — DOCUSATE SODIUM 100 MG PO CAPS
100.0000 mg | ORAL_CAPSULE | Freq: Two times a day (BID) | ORAL | Status: DC
  Administered 2018-11-07 – 2018-11-11 (×9): 100 mg via ORAL
  Filled 2018-11-07 (×9): qty 1

## 2018-11-07 NOTE — Progress Notes (Signed)
[facility] PROGRESS NOTE  Samuel Goodwin IOE:[id number] DOB: [birth year] DOA: 11/05/2018  PCP: [name], MD  Brief History/Interval Summary: 57 y.o. male with medical history significant for alcohol abuse, anxiety, depression, GERD, hep C s/p IFN in 1994, hypertension, hyperlipidemia, hypothyroidism, OSA presented to the hospital for evaluation of right hip pain. Patient stated he [place] walks at the park 4 miles daily.  When he was walking all of a sudden he heard a loud pop and sudden onset right hip pain which caused him to fall on the ground.  Evaluation showed fracture of the right femoral prosthesis.  Orthopedic was consulted.  Patient was hospitalized for further management.  Reason for Visit: Right-sided femoral prosthesis fracture  Consultants: Orthopedics  Procedures: None yet  Antibiotics: None  Subjective/Interval History: Patient states that pain is reasonably well controlled at rest.  Experiences severe pain with any movement.  Denies any chest pain or shortness of breath.    ROS: Denies any nausea or vomiting  Objective:  Vital Signs  Vitals:   11/06/18 1059 11/06/18 1401 11/06/18 2240 11/07/18 0543  BP:  (!) 145/75 134/80 (!) 134/93  Pulse:  80 62 64  Resp:  17 15 17   Temp:  98.7 F (37.1 C) 97.9 F (36.6 C) (!) 97.5 F (36.4 C)  TempSrc:  Oral Oral Oral  SpO2:  96% 99% 100%  Weight: 131.1 kg     Height: 6' (1.829 m)       Intake/Output Summary (Last 24 hours) at 11/07/2018 1142 Last data filed at 11/07/2018 1000 Gross per 24 hour  Intake 1680 ml  Output 2200 ml  Net -520 ml   Filed Weights   11/05/18 1743 11/05/18 2243 11/06/18 1059  Weight: 131.1 kg 131.1 kg 131.1 kg    General appearance: Awake alert.  In no distress Resp: Effort at rest.  Clear to auscultation bilaterally Cardio: S1-S2 is normal regular.  No S3-S4.  No rubs murmurs or bruit GI: Abdomen is soft.  Nontender nondistended Extremities: No obvious edema.   Right lower extremity is immobilized Neurologic: no focal neurological deficits  Lab Results:  Data Reviewed: I have personally reviewed following labs and imaging studies  CBC: Recent Labs  Lab 11/05/18 1751 11/07/18 0501  WBC 7.6 6.6  HGB 12.7* 13.4  HCT 37.9* 40.4  MCV 89.0 89.4  PLT 179 182    Basic Metabolic Panel: Recent Labs  Lab 11/05/18 1751 11/07/18 0501  NA 138 141  K 3.9 4.5  CL 103 105  CO2 26 29  GLUCOSE 99 102*  BUN 20 14  CREATININE 0.81 0.82  CALCIUM 8.3* 8.4*    GFR: Estimated Creatinine Clearance: 140.9 mL/min (by C-G formula based on SCr of 0.82 mg/dL).  Coagulation Profile: Recent Labs  Lab 11/05/18 1919  INR 0.92     Radiology Studies: Dg Chest Portable 1 View  Result Date: 11/05/2018 CLINICAL DATA:  Preop for hip surgery. EXAM: PORTABLE CHEST 1 VIEW COMPARISON:  Radiographs of July 05, 2013. FINDINGS: Stable cardiomegaly. Both lungs are clear. The visualized skeletal structures are unremarkable. IMPRESSION: No active disease. Electronically Signed   By: Marijo Conception, M.D.   On: 11/05/2018 19:47   Dg Hip Unilat  With Pelvis 2-3 Views Right  Result Date: 11/05/2018 CLINICAL DATA:  Fall EXAM: DG HIP (WITH OR WITHOUT PELVIS) 2-3V RIGHT COMPARISON:  02/05/2016 FINDINGS: Right hip replacement. Fracture of the femoral prosthesis at the femoral neck level. Mild angulation. Acetabulum in good position. No  bony fracture Left hip replacement in satisfactory position IMPRESSION: Fracture of right femoral prosthesis Electronically Signed   By: Franchot Gallo M.D.   On: 11/05/2018 18:55     Medications:  Scheduled: . docusate sodium  100 mg Oral BID  . enoxaparin (LOVENOX) injection  40 mg Subcutaneous Q24H  . fluticasone  1 spray Each Nare Daily  . levothyroxine  250 mcg Oral QAC breakfast  . pantoprazole  40 mg Oral Daily  . polyethylene glycol  17 g Oral Daily  . pravastatin  40 mg Oral q1800   Continuous:  XTA:[id number],  albuterol, HYDROcodone-acetaminophen, morphine injection, ondansetron (ZOFRAN) IV    Assessment/Plan:  Fracture of the right femoral prosthesis Orthopedics is following.  Pain control.  Bowel regimen.  Unfortunately it looks like orthopedics will not be able to operate until Monday.  Patient did work with physical therapy yesterday but could not tolerate this well due to severe pain.  He is unable to mobilize himself.  It will be very difficult for the patient to go home and come back next week for surgery.  In view of this patient will need to stay in the hospital.  Patient started on DVT prophylaxis.   Essential hypertension Blood pressure reasonably well controlled.  Holding his hydrochlorothiazide and losartan.  Able.  Normocytic anemia Hemoglobin is stable.  No evidence of overt bleeding.  History of reactive airway disease Currently stable.  No wheezing appreciated.  History of hypothyroidism Continue home dose of Synthroid.  History of GERD PPI  DVT Prophylaxis: Lovenox    Code Status: Full code Family Communication: Discussed with the patient Disposition Plan: Management as outlined above.    LOS: 2 days   [facility] Pager [phone] 11/07/2018, 11:42 AM  If 7PM-7AM, please contact night-coverage at [email or web address], password [facility]

## 2018-11-07 NOTE — Progress Notes (Signed)
Patient ID: Samuel Goodwin, male   DOB: 04-18-1961, 57 y.o.   MRN: [id number]     Patient stable Reviewed holiday situation with patient and his wife Needs complex revision surgery performed - will be scheduled as add on Monday as discussed DVT prophylaxis until Sunday, lovenox and SCDs Unable to safely or comfortably go home until that time Pain control until surgery

## 2018-11-08 MED ORDER — POLYETHYLENE GLYCOL 3350 17 G PO PACK
17.0000 g | PACK | Freq: Two times a day (BID) | ORAL | Status: DC
  Administered 2018-11-08 – 2018-11-17 (×15): 17 g via ORAL
  Filled 2018-11-08 (×17): qty 1

## 2018-11-08 NOTE — Progress Notes (Signed)
Physical Therapy Treatment Patient Details Name: Samuel Goodwin MRN: [id number] DOB: 02/10/1961 Today's Date: 11/08/2018    History of Present Illness 57 yo male admitted 11/26 for fracture of R femoral prosthesis at femoral neck. NWB RLE. Pt scheduled for R THA revision on 11/11/18. PMH includes alcohol abuse, anxiety, OA, vertigo, depression, cancer, hep C, HLD, HTN, OSA, PTSD, L THA 2018, R THA 2017 with attempted reduction after dislocation and subsequent revision, HLD, L TKR 2015.    PT Comments    Pt with improved LE ROM and participation on LE exercises this session, as pt reports R hip pain is more under control at this time. Pt deferred sitting EOB today due to fear of pain. Pt reporting back pain with bedrest. PT encouraged pt to make position changes in bed frequently using pillows to bolster him in slight sidelying for instance. PT to continue to follow acutely, pt to have surgery on Monday.    Follow Up Recommendations  Follow surgeon's recommendation for DC plan and follow-up therapies;Supervision for mobility/OOB     Equipment Recommendations  None recommended by PT    Recommendations for Other Services       Precautions / Restrictions Precautions Precautions: Fall Restrictions Weight Bearing Restrictions: Yes RUE Weight Bearing: Non weight bearing    Mobility  Bed Mobility Overal bed mobility: (Mobility not tolerated by pt this session, PROM and AAROM of RLE only)                Transfers                    Ambulation/Gait                 Stairs             Wheelchair Mobility    Modified Rankin (Stroke Patients Only)       Balance Overall balance assessment: (unable to assess due to pt's high pain level and inability to assist in bed mobility)                                          Cognition Arousal/Alertness: Awake/alert Behavior During Therapy: WFL for tasks assessed/performed Overall Cognitive  Status: Within Functional Limits for tasks assessed                                        Exercises Total Joint Exercises Ankle Circles/Pumps: AROM;Both;5 reps;Supine Quad Sets: AROM;Right;10 reps;Supine Short Arc Quad: AAROM;Right;15 reps;Supine Hip ABduction/ADduction: PROM;Right;20 reps;Supine(Pt tolerates adduction more than adduction) Knee Flexion: PROM;Right;Other reps (comment);Supine(hip and knee flexion, for 5 minutes to decrease stiffness and increase mobility. Pt with vocalizations due to pain, unable to actively assist )    General Comments        Pertinent Vitals/Pain Pain Assessment: 0-10 Pain Score: 10-Worst pain ever Pain Location: R hip and RLE, with hip flexion beyond 45* Pain Descriptors / Indicators: Spasm;Squeezing;Tightness Pain Intervention(s): Limited activity within patient's tolerance;Repositioned;Monitored during session;RN gave pain meds during session    Home Living                      Prior Function            PT Goals (current goals can now be found in the care  plan section) Acute Rehab PT Goals Patient Stated Goal: decrease pain  PT Goal Formulation: With patient Time For Goal Achievement: 11/20/18 Potential to Achieve Goals: Good Progress towards PT goals: Progressing toward goals    Frequency    Min 5X/week      PT Plan Current plan remains appropriate    Co-evaluation              AM-PAC PT "6 Clicks" Mobility   Outcome Measure  Help needed turning from your back to your side while in a flat bed without using bedrails?: A Lot Help needed moving from lying on your back to sitting on the side of a flat bed without using bedrails?: Total Help needed moving to and from a bed to a chair (including a wheelchair)?: Total Help needed standing up from a chair using your arms (e.g., wheelchair or bedside chair)?: Total Help needed to walk in hospital room?: Total Help needed climbing 3-5 steps with a  railing? : Total 6 Click Score: 7    End of Session   Activity Tolerance: Patient limited by pain Patient left: in bed;with bed alarm set;with call bell/phone within reach;with family/visitor present;with SCD's reapplied Nurse Communication: Mobility status PT Visit Diagnosis: Other abnormalities of gait and mobility (R26.89);Pain Pain - Right/Left: Right Pain - part of body: Hip     Time: 1610-1630 PT Time Calculation (min) (ACUTE ONLY): 20 min  Charges:  $Therapeutic Exercise: 8-22 mins                    Robert Sperl Conception Chancy, PT Acute Rehabilitation Services Pager [phone]  Office [phone]  Dafney Farler D Elonda Husky 11/08/2018, 5:58 PM

## 2018-11-08 NOTE — Progress Notes (Signed)
[facility] PROGRESS NOTE  Samuel Goodwin YIR:[id number] DOB: 03-Mar-1961 DOA: 11/05/2018  PCP: [name], MD  Brief History/Interval Summary: 57 y.o. male with medical history significant for alcohol abuse, anxiety, depression, GERD, hep C s/p IFN in 1994, hypertension, hyperlipidemia, hypothyroidism, OSA presented to the hospital for evaluation of right hip pain. Patient stated he normally walks at the park 4 miles daily.  When he was walking all of a sudden he heard a loud pop and sudden onset right hip pain which caused him to fall on the ground.  Evaluation showed fracture of the right femoral prosthesis.  Orthopedic was consulted.  Patient was hospitalized for further management.  Reason for Visit: Right-sided femoral prosthesis fracture  Consultants: Orthopedics  Procedures: None yet  Antibiotics: None  Subjective/Interval History: Patient experienced more pain in his right lower extremity overnight.  Denies any pain this morning.  No shortness of breath or chest pain.    ROS: Denies any nausea or vomiting  Objective:  Vital Signs  Vitals:   11/07/18 1734 11/07/18 2104 11/08/18 0148 11/08/18 0642  BP: (!) 147/89 130/89 (!) 143/93 (!) 139/95  Pulse: 67 69 65 65  Resp: 14 18 18 17   Temp: 98.6 F (37 C) 98.1 F (36.7 C) 98.1 F (36.7 C) (!) 97.5 F (36.4 C)  TempSrc: Oral Oral Oral Oral  SpO2: 98% 97% 98% 96%  Weight:      Height:        Intake/Output Summary (Last 24 hours) at 11/08/2018 1133 Last data filed at 11/08/2018 1057 Gross per 24 hour  Intake 1220 ml  Output 2675 ml  Net -1455 ml   Filed Weights   11/05/18 1743 11/05/18 2243 11/06/18 1059  Weight: 131.1 kg 131.1 kg 131.1 kg    General appearance: Awake alert.  In no distress Resp: Normal effort at rest.  Clear to auscultation bilaterally Cardio: S1-S2 is normal regular.  No S3-S4.  No rubs murmurs or bruit GI: Abdomen is soft.  Nontender nondistended Extremities: No obvious  edema.  Right lower extremity is immobilized. Neurologic: no focal neurological deficits  Lab Results:  Data Reviewed: I have personally reviewed following labs and imaging studies  CBC: Recent Labs  Lab 11/05/18 1751 11/07/18 0501  WBC 7.6 6.6  HGB 12.7* 13.4  HCT 37.9* 40.4  MCV 89.0 89.4  PLT 179 500    Basic Metabolic Panel: Recent Labs  Lab 11/05/18 1751 11/07/18 0501  NA 138 141  K 3.9 4.5  CL 103 105  CO2 26 29  GLUCOSE 99 102*  BUN 20 14  CREATININE 0.81 0.82  CALCIUM 8.3* 8.4*    GFR: Estimated Creatinine Clearance: 140.9 mL/min (by C-G formula based on SCr of 0.82 mg/dL).  Coagulation Profile: Recent Labs  Lab 11/05/18 1919  INR 0.92     Radiology Studies: No results found.   Medications:  Scheduled: . docusate sodium  100 mg Oral BID  . enoxaparin (LOVENOX) injection  40 mg Subcutaneous Q24H  . fluticasone  1 spray Each Nare Daily  . levothyroxine  250 mcg Oral QAC breakfast  . pantoprazole  40 mg Oral Daily  . polyethylene glycol  17 g Oral BID  . pravastatin  40 mg Oral q1800   Continuous:  XFG:[id number], albuterol, HYDROcodone-acetaminophen, morphine injection, ondansetron (ZOFRAN) IV    Assessment/Plan:  Fracture of the right femoral prosthesis Orthopedics is following.  Plan is for surgical intervention on Monday.  Pain control.  Bowel regimen.  DVT prophylaxis.  Patient was seen by physical therapy but thought to have very poor level of functioning due to pain. He is unable to mobilize himself.  It will be very difficult for the patient to go home and come back next week for surgery.  In view of this patient will need to stay in the hospital.   Constipation Patient has not had a bowel movement yet.  Increased dose of MiraLAX.  Essential hypertension Blood pressure reasonably well controlled.  Holding his hydrochlorothiazide and losartan.  Remains stable  Normocytic anemia Hemoglobin is stable.  No evidence of overt  bleeding.  History of reactive airway disease Currently stable.  No wheezing appreciated.  History of hypothyroidism Continue home dose of Synthroid.  History of GERD PPI  DVT Prophylaxis: Lovenox    Code Status: Full code Family Communication: Discussed with patient and his wife Disposition Plan: Management as outlined above.  Await surgery on Monday.    LOS: 3 days   [facility] Pager [phone] 11/08/2018, 11:33 AM  If 7PM-7AM, please contact night-coverage at [email or web address], password [facility]

## 2018-11-08 NOTE — Progress Notes (Signed)
   Subjective:   Patient reports pain as severe when attempting to move the right LE, otherwise is mild.   Patient seen in rounds for Dr. Alvan Dame. Patient is well, and has had no acute complaints or problems other than pain in right hip.  Objective: Vital signs in last 24 hours: Temp:  [97.5 F (36.4 C)-98.6 F (37 C)] 97.5 F (36.4 C) (11/29 1443) Pulse Rate:  [65-69] 65 (11/29 0642) Resp:  [14-19] 17 (11/29 0642) BP: (130-147)/(82-95) 139/95 (11/29 0642) SpO2:  [96 %-98 %] 96 % (11/29 0642)  Intake/Output from previous day:  Intake/Output Summary (Last 24 hours) at 11/08/2018 0917 Last data filed at 11/08/2018 0719 Gross per 24 hour  Intake 1520 ml  Output 2700 ml  Net -1180 ml    Intake/Output this shift: Total I/O In: -  Out: 300 [Urine:300]  Labs: Recent Labs    11/05/18 1751 11/07/18 0501  HGB 12.7* 13.4   Recent Labs    11/05/18 1751 11/07/18 0501  WBC 7.6 6.6  RBC 4.26 4.52  HCT 37.9* 40.4  PLT 179 199   Recent Labs    11/05/18 1751 11/07/18 0501  NA 138 141  K 3.9 4.5  CL 103 105  CO2 26 29  BUN 20 14  CREATININE 0.81 0.82  GLUCOSE 99 102*  CALCIUM 8.3* 8.4*   Recent Labs    11/05/18 1919  INR 0.92    Exam: General - Patient is Alert and Oriented Extremity - Neurologically intact Neurovascular intact Sensation intact distally Dorsiflexion/Plantar flexion intact Motor Function - intact, moving foot and toes well on exam.   Past Medical History:  Diagnosis Date  . Alcohol abuse    Sobriety since 1990  . Anxiety   . Arthritis    oa  . Benign positional vertigo   . 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 Depression   . Eardrum trauma    30 years ago/right ear  . GERD (gastroesophageal reflux disease)   . Headache    sinus  . Hepatitis C 1994   hep c - ? etiology; in [place] 1989-91; S/P interferon , Dr [name]  . History of bronchitis   . Hoarseness   . Hyperlipidemia   . Hypertension   . Hypothyroidism   . OSA  (obstructive sleep apnea)    on CPAP  . PONV (postoperative nausea and vomiting)    following knee surgery last year   . PTSD (post-traumatic stress disorder)   . Vocal cord polyps     Assessment/Plan:   Procedure(s) (LRB): right total hip arthroplasty revision, femoral stem (Right) Principal Problem:   Femoral fracture (HCC) Active Problems:   Hypothyroidism   HTN (hypertension)   GERD (gastroesophageal reflux disease)   Chronic anemia   Reactive airway disease  Estimated body mass index is 39.2 kg/m as calculated from the following:   Height as of this encounter: 6' (1.829 m).   Weight as of this encounter: 131.1 kg.  DVT Prophylaxis - Lovenox  Pain control until surgery on Monday After evaluation by PT, determined that patient will remain in hospital until total hip arthroplasty revision by Dr. Alvan Dame on Monday.  Theresa Duty, PA-C Orthopedic Surgery 11/08/2018, 9:17 AM

## 2018-11-09 DIAGNOSIS — D649 Anemia, unspecified: Secondary | ICD-10-CM

## 2018-11-09 NOTE — Progress Notes (Signed)
   Subjective: Recheck right hip s/p stem failure Plan for surgery/revision on Monday with Dr. Alvan Dame Pt c/o mild to moderate pain in the hip currently Denies any new symptoms or issues Patient reports pain as mild.  Objective:   VITALS:   Vitals:   11/08/18 2153 11/09/18 0629  BP: (!) 130/93 132/89  Pulse: 62 65  Resp: 18 18  Temp: 98.2 F (36.8 C) 98 F (36.7 C)  SpO2: 98% 96%    Right lower extremity: minimal movement due to stem failure in the hip nv intact distally Not taken thru any rom today  LABS Recent Labs    11/07/18 0501  HGB 13.4  HCT 40.4  WBC 6.6  PLT 199    Recent Labs    11/07/18 0501  NA 141  K 4.5  BUN 14  CREATININE 0.82  GLUCOSE 102*     Assessment/Plan: Plan for surgery with Dr. Alvan Dame on Monday NPO tomorrow at midnight Continue current treatment Pain medications as needed Strict bedrest    Merla Riches PA-C, [facility] [employer] [address], [place], [place], [place] [address] Phone: [phone] [email or web address] Facebook  [employer]

## 2018-11-09 NOTE — Progress Notes (Signed)
[facility] PROGRESS NOTE  Samuel Goodwin ASN:[id number] DOB: 1961/03/07 DOA: 11/05/2018  PCP: [name], MD  Brief History/Interval Summary: 57 y.o. male with medical history significant for alcohol abuse, anxiety, depression, GERD, hep C s/p IFN in 1994, hypertension, hyperlipidemia, hypothyroidism, OSA presented to the hospital for evaluation of right hip pain. Patient stated he [place] walks at the park 4 miles daily.  When he was walking all of a sudden he heard a loud pop and sudden onset right hip pain which caused him to fall on the ground.  Evaluation showed fracture of the right femoral prosthesis.  Orthopedic was consulted.  Patient was hospitalized for further management. Due to holiday schedule patient could not be operated upon and needs to wait till Monday.  Due to his pain and immobility he could not be discharged home in the interim.  Reason for Visit: Right-sided femoral prosthesis fracture  Consultants: Orthopedics  Procedures: None yet  Antibiotics: None  Subjective/Interval History: Patient feels well.  Denies any complaints.  No changes from yesterday.  Waiting on a bowel movement.  ROS: Denies any nausea or vomiting  Objective:  Vital Signs  Vitals:   11/08/18 0642 11/08/18 1332 11/08/18 2153 11/09/18 0629  BP: (!) 139/95 (!) 144/86 (!) 130/93 132/89  Pulse: 65 64 62 65  Resp: 17 18 18 18   Temp: (!) 97.5 F (36.4 C) 98 F (36.7 C) 98.2 F (36.8 C) 98 F (36.7 C)  TempSrc: Oral Oral Oral Oral  SpO2: 96% 99% 98% 96%  Weight:      Height:        Intake/Output Summary (Last 24 hours) at 11/09/2018 0851 Last data filed at 11/09/2018 0803 Gross per 24 hour  Intake 250 ml  Output 2225 ml  Net -1975 ml   Filed Weights   11/05/18 1743 11/05/18 2243 11/06/18 1059  Weight: 131.1 kg 131.1 kg 131.1 kg    General appearance: Awake alert.  In no distress Resp: Normal effort at rest.  Clear to auscultation bilaterally Cardio: S1-S2 is  normal regular GI: Abdomen is soft.  Nontender nondistended Extremities: No obvious edema.  Right lower extremity is immobilized. Neurologic: A&Ox3.  No focal neurological deficits.  Lab Results:  Data Reviewed: I have personally reviewed following labs and imaging studies  CBC: Recent Labs  Lab 11/05/18 1751 11/07/18 0501  WBC 7.6 6.6  HGB 12.7* 13.4  HCT 37.9* 40.4  MCV 89.0 89.4  PLT 179 193    Basic Metabolic Panel: Recent Labs  Lab 11/05/18 1751 11/07/18 0501  NA 138 141  K 3.9 4.5  CL 103 105  CO2 26 29  GLUCOSE 99 102*  BUN 20 14  CREATININE 0.81 0.82  CALCIUM 8.3* 8.4*    GFR: Estimated Creatinine Clearance: 140.9 mL/min (by C-G formula based on SCr of 0.82 mg/dL).  Coagulation Profile: Recent Labs  Lab 11/05/18 1919  INR 0.92     Radiology Studies: No results found.   Medications:  Scheduled: . docusate sodium  100 mg Oral BID  . enoxaparin (LOVENOX) injection  40 mg Subcutaneous Q24H  . fluticasone  1 spray Each Nare Daily  . levothyroxine  250 mcg Oral QAC breakfast  . pantoprazole  40 mg Oral Daily  . polyethylene glycol  17 g Oral BID  . pravastatin  40 mg Oral q1800   Continuous:  XTK:WIOXBDZHGDJME, albuterol, HYDROcodone-acetaminophen, morphine injection, ondansetron (ZOFRAN) IV    Assessment/Plan:  Fracture of the right femoral prosthesis Orthopedics is following.  Plan is for surgical intervention on Monday.  Pain control.  Bowel regimen.  DVT prophylaxis.  Patient needs to stay in the hospital to have significant pain even with slight movement and inability to move and so considered unsafe to go home without operative intervention.    Constipation Patient has not had a bowel movement yet.  Increased dose of MiraLAX.  May need enema.  Patient feels that he is going to have a bowel movement today.  Essential hypertension Blood pressure reasonably well controlled.  Holding his hydrochlorothiazide and losartan.  Remains  stable  Normocytic anemia Hemoglobin has been stable.  No evidence of overt bleeding.  History of reactive airway disease Currently stable.  No wheezing appreciated.  History of hypothyroidism Continue home dose of Synthroid.  History of GERD PPI  DVT Prophylaxis: Lovenox    Code Status: Full code Family Communication: Discussed with the patient Disposition Plan: Management as outlined above.  Await surgery on Monday.    LOS: 4 days   [facility] Pager [phone] 11/09/2018, 8:51 AM  If 7PM-7AM, please contact night-coverage at [email or web address], password [facility]

## 2018-11-09 NOTE — Progress Notes (Signed)
PT Cancellation Note  Patient Details Name: Samuel Goodwin MRN: [id number] DOB: 1961/10/06   Cancelled Treatment:    Reason Eval/Treat Not Completed: Active bedrest order/recommendation. Per ortho progress note today, strict bedrest is recommended. Will hold PT until pt cleared for mobility by orthopedics-pending surgery on Monday 12/2. Thanks.    Weston Anna, PT Acute Rehabilitation Services Pager: [phone] Office: [phone]

## 2018-11-10 DIAGNOSIS — K5900 Constipation, unspecified: Secondary | ICD-10-CM

## 2018-11-10 LAB — BASIC METABOLIC PANEL
Anion gap: 6 (ref 5–15)
BUN: 15 mg/dL (ref 6–20)
CALCIUM: 8.3 mg/dL — AB (ref 8.9–10.3)
CHLORIDE: 100 mmol/L (ref 98–111)
CO2: 30 mmol/L (ref 22–32)
CREATININE: 0.79 mg/dL (ref 0.61–1.24)
GFR calc non Af Amer: >60 mL/min (ref >60)
Glucose, Bld: 127 mg/dL — ABNORMAL HIGH (ref 70–99)
Potassium: 4 mmol/L (ref 3.5–5.1)
SODIUM: 136 mmol/L (ref 135–145)

## 2018-11-10 LAB — CBC
HEMATOCRIT: 40.2 % (ref 39.0–52.0)
HEMOGLOBIN: 13.4 g/dL (ref 13.0–17.0)
MCH: 30.1 pg (ref 26.0–34.0)
MCHC: 33.3 g/dL (ref 30.0–36.0)
MCV: 90.3 fL (ref 80.0–100.0)
Platelets: 181 10*3/uL (ref 150–400)
RBC: 4.45 MIL/uL (ref 4.22–5.81)
RDW: 12.6 % (ref 11.5–15.5)
WBC: 7.2 10*3/uL (ref 4.0–10.5)
nRBC: 0 % (ref 0.0–0.2)

## 2018-11-10 NOTE — Plan of Care (Signed)
Patient in bed this morning. States pain is under pretty good control at the moment. Will continue to monitor.

## 2018-11-10 NOTE — Progress Notes (Signed)
[facility] PROGRESS NOTE  Samuel Goodwin GUR:[id number] DOB: [birth year] DOA: 11/05/2018  PCP: [name], MD  Brief History/Interval Summary: 57 y.o. male with medical history significant for alcohol abuse, anxiety, depression, GERD, hep C s/p IFN in 1994, hypertension, hyperlipidemia, hypothyroidism, OSA presented to the hospital for evaluation of right hip pain. Patient stated he normally walks at the park 4 miles daily.  When he was walking all of a sudden he heard a loud pop and sudden onset right hip pain which caused him to fall on the ground.  Evaluation showed fracture of the right femoral prosthesis.  Orthopedic was consulted.  Patient was hospitalized for further management. Due to holiday schedule patient could not be operated upon and needs to wait till Monday.  Due to his pain and immobility he could not be discharged home in the interim.  Reason for Visit: Right-sided femoral prosthesis fracture  Consultants: Orthopedics  Procedures: None yet  Antibiotics: None  Subjective/Interval History: Patient feels well.  Denies any complaints.  Waiting for surgery tomorrow.  Had a large bowel movement yesterday.  ROS: No nausea or vomiting  Objective:  Vital Signs  Vitals:   11/09/18 1410 11/09/18 2114 11/10/18 0139 11/10/18 0431  BP: 117/77 138/82 136/88 118/85  Pulse: 67 65 60 70  Resp: 18 20 20 20   Temp: 97.7 F (36.5 C) 98.2 F (36.8 C) 98.2 F (36.8 C) 97.7 F (36.5 C)  TempSrc:  Oral Oral Oral  SpO2: 97% 96% 98% 100%  Weight:      Height:        Intake/Output Summary (Last 24 hours) at 11/10/2018 1038 Last data filed at 11/10/2018 0910 Gross per 24 hour  Intake 1300 ml  Output 2875 ml  Net -1575 ml   Filed Weights   11/05/18 1743 11/05/18 2243 11/06/18 1059  Weight: 131.1 kg 131.1 kg 131.1 kg    General appearance: Awake alert.  In no distress Resp: Normal effort at rest.  Clear to auscultation bilaterally Cardio: S1-S2 is normal  regular.  No S3-S4.  No rubs murmurs or bruit GI: Abdomen soft.  Nontender nondistended Extremities: No edema.  Right lower extremity not manipulated due to fracture Neurologic: No focal neurological deficits.  Lab Results:  Data Reviewed: I have personally reviewed following labs and imaging studies  CBC: Recent Labs  Lab 11/05/18 1751 11/07/18 0501 11/10/18 0355  WBC 7.6 6.6 7.2  HGB 12.7* 13.4 13.4  HCT 37.9* 40.4 40.2  MCV 89.0 89.4 90.3  PLT 179 199 283    Basic Metabolic Panel: Recent Labs  Lab 11/05/18 1751 11/07/18 0501 11/10/18 0355  NA 138 141 136  K 3.9 4.5 4.0  CL 103 105 100  CO2 26 29 30   GLUCOSE 99 102* 127*  BUN 20 14 15   CREATININE 0.81 0.82 0.79  CALCIUM 8.3* 8.4* 8.3*    GFR: Estimated Creatinine Clearance: 144.4 mL/min (by C-G formula based on SCr of 0.79 mg/dL).  Coagulation Profile: Recent Labs  Lab 11/05/18 1919  INR 0.92     Radiology Studies: No results found.   Medications:  Scheduled: . docusate sodium  100 mg Oral BID  . enoxaparin (LOVENOX) injection  40 mg Subcutaneous Q24H  . fluticasone  1 spray Each Nare Daily  . levothyroxine  250 mcg Oral QAC breakfast  . pantoprazole  40 mg Oral Daily  . polyethylene glycol  17 g Oral BID  . pravastatin  40 mg Oral q1800   Continuous:  TDV:VOHYWVPXTGGYI,  albuterol, HYDROcodone-acetaminophen, morphine injection, ondansetron (ZOFRAN) IV    Assessment/Plan:  Fracture of the right femoral prosthesis Further management per orthopedics. Patient needs to stay in the hospital to have significant pain even with slight movement and inability to move and so considered unsafe to go home without operative intervention.  Plan is for surgical intervention on Monday.  Pain control.  Bowel regimen.  DVT prophylaxis.     Constipation Patient had a large bowel movement yesterday.  Continue MiraLAX.     Essential hypertension Blood pressure reasonably well controlled.  Holding his  hydrochlorothiazide and losartan.  Remains stable  Normocytic anemia Hemoglobin has normalized.  Check after surgery.  History of reactive airway disease Currently stable.  No wheezing appreciated.  History of hypothyroidism Continue home dose of Synthroid. Check TSH and free T4 with next labs on 12/3.  History of GERD PPI  DVT Prophylaxis: Lovenox    Code Status: Full code Family Communication: Discussed with the patient Disposition Plan: Management as outlined above.  Hopefully surgery tomorrow.    LOS: 5 days   [facility] Pager [phone] 11/10/2018, 10:38 AM  If 7PM-7AM, please contact night-coverage at [email or web address], password [facility]

## 2018-11-10 NOTE — Progress Notes (Signed)
   Subjective:   Procedure(s) (LRB): right total hip arthroplasty revision, femoral stem (Right) Patient reports pain as mild.   Patient seen in rounds for Dr. Alvan Dame. Patient is well, and has had no acute complaints or problems. Reports mild pain in the right hip. Denies SOB and chest pain. Anxious for surgery tomorrow.   Objective: Vital signs in last 24 hours: Temp:  [97.7 F (36.5 C)-98.2 F (36.8 C)] 97.7 F (36.5 C) (12/01 0431) Pulse Rate:  [60-70] 70 (12/01 0431) Resp:  [18-20] 20 (12/01 0431) BP: (117-138)/(77-88) 118/85 (12/01 0431) SpO2:  [96 %-100 %] 100 % (12/01 0431)  Intake/Output from previous day:  Intake/Output Summary (Last 24 hours) at 11/10/2018 0908 Last data filed at 11/10/2018 0723 Gross per 24 hour  Intake 820 ml  Output 2725 ml  Net -1905 ml    Intake/Output this shift: Total I/O In: -  Out: 250 [Urine:250]  Labs: Recent Labs    11/10/18 0355  HGB 13.4   Recent Labs    11/10/18 0355  WBC 7.2  RBC 4.45  HCT 40.2  PLT 181   Recent Labs    11/10/18 0355  NA 136  K 4.0  CL 100  CO2 30  BUN 15  CREATININE 0.79  GLUCOSE 127*  CALCIUM 8.3*    EXAM General - Patient is Alert and Oriented Extremity - Neurologically intact Intact pulses distally Dorsiflexion/Plantar flexion intact No cellulitis present Compartment soft Motor Function - intact, moving foot and toes well on exam.   Past Medical History:  Diagnosis Date  . Alcohol abuse    Sobriety since 1990  . Anxiety   . Arthritis    oa  . Benign positional vertigo   . 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 Depression   . Eardrum trauma    30 years ago/right ear  . GERD (gastroesophageal reflux disease)   . Headache    sinus  . Hepatitis C 1994   hep c - ? etiology; in [place] 1989-91; S/P interferon , Dr [name]  . History of bronchitis   . Hoarseness   . Hyperlipidemia   . Hypertension   . Hypothyroidism   . OSA (obstructive sleep apnea)    on CPAP  . PONV  (postoperative nausea and vomiting)    following knee surgery last year   . PTSD (post-traumatic stress disorder)   . Vocal cord polyps     Assessment/Plan:    Right hip prosthesis faliure Procedure(s) (LRB): Right total hip arthroplasty revision, femoral stem (Right) scheduled for 12/2 Principal Problem:   Femoral fracture (HCC) Active Problems:   Hypothyroidism   HTN (hypertension)   GERD (gastroesophageal reflux disease)   Chronic anemia   Reactive airway disease  Estimated body mass index is 39.2 kg/m as calculated from the following:   Height as of this encounter: 6' (1.829 m).   Weight as of this encounter: 131.1 kg.   DVT Prophylaxis - Lovenox  Plan for surgery tomorrow afternoon. NPO after midnight. NWB right leg.   Ardeen Jourdain, PA-C Orthopaedic Surgery 11/10/2018, 9:08 AM

## 2018-11-11 ENCOUNTER — Inpatient Hospital Stay (HOSPITAL_COMMUNITY): Payer: No Typology Code available for payment source | Admitting: Certified Registered Nurse Anesthetist

## 2018-11-11 ENCOUNTER — Encounter (HOSPITAL_COMMUNITY)
Admission: EM | Disposition: A | Discharge status: Home Health | Payer: Self-pay | Source: Home / Self Care | Attending: Internal Medicine

## 2018-11-11 ENCOUNTER — Inpatient Hospital Stay (HOSPITAL_COMMUNITY): Payer: No Typology Code available for payment source

## 2018-11-11 ENCOUNTER — Encounter (HOSPITAL_COMMUNITY): Payer: Self-pay | Admitting: Certified Registered Nurse Anesthetist

## 2018-11-11 HISTORY — PX: TOTAL HIP REVISION: SHX763

## 2018-11-11 LAB — TYPE AND SCREEN
ABO/RH(D): O POS
Antibody Screen: NEGATIVE

## 2018-11-11 LAB — SURGICAL PCR SCREEN
MRSA, PCR: NEGATIVE
Staphylococcus aureus: POSITIVE — AB

## 2018-11-11 SURGERY — TOTAL HIP REVISION
Anesthesia: General | Laterality: Right

## 2018-11-11 MED ORDER — SODIUM CHLORIDE 0.9 % IV SOLN
INTRAVENOUS | Status: DC
  Administered 2018-11-11: 23:00:00 via INTRAVENOUS

## 2018-11-11 MED ORDER — FENTANYL CITRATE (PF) 100 MCG/2ML IJ SOLN
INTRAMUSCULAR | Status: AC
  Filled 2018-11-11: qty 2

## 2018-11-11 MED ORDER — ROCURONIUM BROMIDE 10 MG/ML (PF) SYRINGE
PREFILLED_SYRINGE | INTRAVENOUS | Status: DC | PRN
  Administered 2018-11-11 (×5): 20 mg via INTRAVENOUS
  Administered 2018-11-11: 60 mg via INTRAVENOUS
  Administered 2018-11-11: 20 mg via INTRAVENOUS

## 2018-11-11 MED ORDER — DIPHENHYDRAMINE HCL 12.5 MG/5ML PO ELIX: 12.5000 mg | ORAL_SOLUTION | ORAL | Status: DC | PRN

## 2018-11-11 MED ORDER — DEXAMETHASONE SODIUM PHOSPHATE 10 MG/ML IJ SOLN
10.0000 mg | Freq: Once | INTRAMUSCULAR | Status: DC
  Filled 2018-11-11: qty 1

## 2018-11-11 MED ORDER — DEXMEDETOMIDINE HCL 200 MCG/2ML IV SOLN
INTRAVENOUS | Status: DC | PRN
  Administered 2018-11-11: 12 ug via INTRAVENOUS
  Administered 2018-11-11 (×2): 8 ug via INTRAVENOUS
  Administered 2018-11-11: 12 ug via INTRAVENOUS

## 2018-11-11 MED ORDER — ALBUMIN HUMAN 5 % IV SOLN
INTRAVENOUS | Status: AC
  Filled 2018-11-11: qty 250

## 2018-11-11 MED ORDER — METHOCARBAMOL 1000 MG/10ML IJ SOLN
500.0000 mg | Freq: Four times a day (QID) | INTRAVENOUS | Status: DC | PRN
  Filled 2018-11-11 (×2): qty 5

## 2018-11-11 MED ORDER — ASPIRIN EC 325 MG PO TBEC
325.0000 mg | DELAYED_RELEASE_TABLET | Freq: Two times a day (BID) | ORAL | Status: DC
  Administered 2018-11-12 – 2018-11-17 (×11): 325 mg via ORAL
  Filled 2018-11-11 (×11): qty 1

## 2018-11-11 MED ORDER — PHENYLEPHRINE 40 MCG/ML (10ML) SYRINGE FOR IV PUSH (FOR BLOOD PRESSURE SUPPORT)
PREFILLED_SYRINGE | INTRAVENOUS | Status: DC | PRN
  Administered 2018-11-11 (×2): 80 ug via INTRAVENOUS

## 2018-11-11 MED ORDER — SUGAMMADEX SODIUM 200 MG/2ML IV SOLN
INTRAVENOUS | Status: DC | PRN
  Administered 2018-11-11: 300 mg via INTRAVENOUS

## 2018-11-11 MED ORDER — PROPOFOL 10 MG/ML IV BOLUS
INTRAVENOUS | Status: AC
  Filled 2018-11-11: qty 80

## 2018-11-11 MED ORDER — LACTATED RINGERS IV SOLN
INTRAVENOUS | Status: DC
  Administered 2018-11-11 (×3): via INTRAVENOUS

## 2018-11-11 MED ORDER — MIDAZOLAM HCL 5 MG/5ML IJ SOLN
INTRAMUSCULAR | Status: DC | PRN
  Administered 2018-11-11: 2 mg via INTRAVENOUS

## 2018-11-11 MED ORDER — METOCLOPRAMIDE HCL 5 MG PO TABS: 5.0000 mg | ORAL_TABLET | Freq: Three times a day (TID) | ORAL | Status: DC | PRN

## 2018-11-11 MED ORDER — ALUM & MAG HYDROXIDE-SIMETH 200-200-20 MG/5ML PO SUSP
15.0000 mL | ORAL | Status: DC | PRN
  Administered 2018-11-12 – 2018-11-15 (×5): 15 mL via ORAL
  Filled 2018-11-11 (×5): qty 30

## 2018-11-11 MED ORDER — FENTANYL CITRATE (PF) 100 MCG/2ML IJ SOLN
100.0000 ug | Freq: Once | INTRAMUSCULAR | Status: AC
  Administered 2018-11-11: 50 ug via INTRAVENOUS
  Filled 2018-11-11: qty 2

## 2018-11-11 MED ORDER — ROCURONIUM BROMIDE 100 MG/10ML IV SOLN
INTRAVENOUS | Status: AC
  Filled 2018-11-11: qty 1

## 2018-11-11 MED ORDER — PROMETHAZINE HCL 25 MG/ML IJ SOLN: 6.2500 mg | INTRAMUSCULAR | Status: DC | PRN

## 2018-11-11 MED ORDER — FENTANYL CITRATE (PF) 100 MCG/2ML IJ SOLN
INTRAMUSCULAR | Status: DC | PRN
  Administered 2018-11-11: 100 ug via INTRAVENOUS
  Administered 2018-11-11 (×3): 50 ug via INTRAVENOUS
  Administered 2018-11-11: 100 ug via INTRAVENOUS
  Administered 2018-11-11 (×3): 50 ug via INTRAVENOUS

## 2018-11-11 MED ORDER — CEFAZOLIN SODIUM-DEXTROSE 2-4 GM/100ML-% IV SOLN
2.0000 g | Freq: Four times a day (QID) | INTRAVENOUS | Status: AC
  Administered 2018-11-11 – 2018-11-12 (×2): 2 g via INTRAVENOUS
  Filled 2018-11-11 (×2): qty 100

## 2018-11-11 MED ORDER — MIDAZOLAM HCL 2 MG/2ML IJ SOLN
INTRAMUSCULAR | Status: AC
  Filled 2018-11-11: qty 2

## 2018-11-11 MED ORDER — DEXAMETHASONE SODIUM PHOSPHATE 10 MG/ML IJ SOLN
INTRAMUSCULAR | Status: DC | PRN
  Administered 2018-11-11: 10 mg via INTRAVENOUS

## 2018-11-11 MED ORDER — BISACODYL 10 MG RE SUPP: 10.0000 mg | Freq: Every day | RECTAL | Status: DC | PRN

## 2018-11-11 MED ORDER — LIDOCAINE 2% (20 MG/ML) 5 ML SYRINGE
INTRAMUSCULAR | Status: DC | PRN
  Administered 2018-11-11: 80 mg via INTRAVENOUS

## 2018-11-11 MED ORDER — FENTANYL CITRATE (PF) 100 MCG/2ML IJ SOLN
25.0000 ug | INTRAMUSCULAR | Status: DC | PRN
  Administered 2018-11-11 (×3): 50 ug via INTRAVENOUS

## 2018-11-11 MED ORDER — FERROUS SULFATE 325 (65 FE) MG PO TABS
325.0000 mg | ORAL_TABLET | Freq: Three times a day (TID) | ORAL | Status: DC
  Administered 2018-11-12 – 2018-11-17 (×16): 325 mg via ORAL
  Filled 2018-11-11 (×17): qty 1

## 2018-11-11 MED ORDER — METOCLOPRAMIDE HCL 5 MG/ML IJ SOLN: 5.0000 mg | Freq: Three times a day (TID) | INTRAMUSCULAR | Status: DC | PRN

## 2018-11-11 MED ORDER — POVIDONE-IODINE 10 % EX SWAB
2.0000 "application " | Freq: Once | CUTANEOUS | Status: AC
  Administered 2018-11-11: 2 via TOPICAL

## 2018-11-11 MED ORDER — METHOCARBAMOL 500 MG PO TABS
500.0000 mg | ORAL_TABLET | Freq: Four times a day (QID) | ORAL | Status: DC | PRN
  Administered 2018-11-12 – 2018-11-17 (×13): 500 mg via ORAL
  Filled 2018-11-11 (×14): qty 1

## 2018-11-11 MED ORDER — SUCCINYLCHOLINE CHLORIDE 200 MG/10ML IV SOSY
PREFILLED_SYRINGE | INTRAVENOUS | Status: AC
  Filled 2018-11-11: qty 10

## 2018-11-11 MED ORDER — TRANEXAMIC ACID-NACL 1000-0.7 MG/100ML-% IV SOLN
1000.0000 mg | Freq: Once | INTRAVENOUS | Status: AC
  Administered 2018-11-12: 1000 mg via INTRAVENOUS
  Filled 2018-11-11: qty 100

## 2018-11-11 MED ORDER — MENTHOL 3 MG MT LOZG: 1.0000 | LOZENGE | OROMUCOSAL | Status: DC | PRN

## 2018-11-11 MED ORDER — PHENOL 1.4 % MT LIQD
1.0000 | OROMUCOSAL | Status: DC | PRN
  Filled 2018-11-11: qty 177

## 2018-11-11 MED ORDER — ALBUMIN HUMAN 5 % IV SOLN
INTRAVENOUS | Status: DC | PRN
  Administered 2018-11-11 (×3): via INTRAVENOUS

## 2018-11-11 MED ORDER — DOCUSATE SODIUM 100 MG PO CAPS
100.0000 mg | ORAL_CAPSULE | Freq: Two times a day (BID) | ORAL | Status: DC
  Administered 2018-11-11 – 2018-11-17 (×11): 100 mg via ORAL
  Filled 2018-11-11 (×11): qty 1

## 2018-11-11 MED ORDER — MAGNESIUM CITRATE PO SOLN: 1.0000 | Freq: Once | ORAL | Status: DC | PRN

## 2018-11-11 MED ORDER — PROPOFOL 10 MG/ML IV BOLUS
INTRAVENOUS | Status: DC | PRN
  Administered 2018-11-11: 220 mg via INTRAVENOUS

## 2018-11-11 MED ORDER — HYDROCODONE-ACETAMINOPHEN 7.5-325 MG PO TABS
1.0000 | ORAL_TABLET | ORAL | Status: DC | PRN
  Administered 2018-11-12 – 2018-11-14 (×13): 2 via ORAL
  Administered 2018-11-14: 1 via ORAL
  Administered 2018-11-14 – 2018-11-15 (×6): 2 via ORAL
  Administered 2018-11-16: 1 via ORAL
  Administered 2018-11-16 (×3): 2 via ORAL
  Administered 2018-11-16: 1 via ORAL
  Administered 2018-11-16 – 2018-11-17 (×4): 2 via ORAL
  Filled 2018-11-11 (×30): qty 2

## 2018-11-11 MED ORDER — DEXTROSE 5 % IV SOLN
3.0000 g | INTRAVENOUS | Status: AC
  Administered 2018-11-11: 3 g via INTRAVENOUS
  Filled 2018-11-11: qty 3

## 2018-11-11 MED ORDER — SUCCINYLCHOLINE CHLORIDE 200 MG/10ML IV SOSY
PREFILLED_SYRINGE | INTRAVENOUS | Status: DC | PRN
  Administered 2018-11-11: 120 mg via INTRAVENOUS

## 2018-11-11 MED ORDER — CHLORHEXIDINE GLUCONATE 4 % EX LIQD
60.0000 mL | Freq: Once | CUTANEOUS | Status: AC
  Administered 2018-11-11: 4 via TOPICAL
  Filled 2018-11-11: qty 60

## 2018-11-11 MED ORDER — ONDANSETRON HCL 4 MG/2ML IJ SOLN
INTRAMUSCULAR | Status: DC | PRN
  Administered 2018-11-11: 4 mg via INTRAVENOUS

## 2018-11-11 SURGICAL SUPPLY — 68 items
ADH SKN CLS APL DERMABOND .7 (GAUZE/BANDAGES/DRESSINGS) ×1
BAG DECANTER FOR FLEXI CONT (MISCELLANEOUS) ×1 IMPLANT
BAG SPEC THK2 15X12 ZIP CLS (MISCELLANEOUS) ×1
BAG ZIPLOCK 12X15 (MISCELLANEOUS) ×2 IMPLANT
BLADE SAW SGTL 11.0X1.19X90.0M (BLADE) IMPLANT
BLADE SAW SGTL 18X1.27X75 (BLADE) ×2 IMPLANT
BNDG COHESIVE 6X5 TAN STRL LF (GAUZE/BANDAGES/DRESSINGS) ×1 IMPLANT
BRUSH FEMORAL CANAL (MISCELLANEOUS) IMPLANT
COVER SURGICAL LIGHT HANDLE (MISCELLANEOUS) ×2 IMPLANT
COVER WAND RF STERILE (DRAPES) IMPLANT
DERMABOND ADVANCED (GAUZE/BANDAGES/DRESSINGS) ×1
DERMABOND ADVANCED .7 DNX12 (GAUZE/BANDAGES/DRESSINGS) ×1 IMPLANT
DRAPE LG THREE QUARTER DISP (DRAPES) ×1 IMPLANT
DRAPE ORTHO SPLIT 77X108 STRL (DRAPES) ×4
DRAPE POUCH INSTRU U-SHP 10X18 (DRAPES) ×2 IMPLANT
DRAPE SURG 17X11 SM STRL (DRAPES) ×2 IMPLANT
DRAPE SURG ORHT 6 SPLT 77X108 (DRAPES) ×2 IMPLANT
DRAPE U-SHAPE 47X51 STRL (DRAPES) ×2 IMPLANT
DRESSING AQUACEL AG SP 3.5X10 (GAUZE/BANDAGES/DRESSINGS) IMPLANT
DRSG AQUACEL AG ADV 3.5X10 (GAUZE/BANDAGES/DRESSINGS) IMPLANT
DRSG AQUACEL AG ADV 3.5X14 (GAUZE/BANDAGES/DRESSINGS) ×2 IMPLANT
DRSG AQUACEL AG SP 3.5X10 (GAUZE/BANDAGES/DRESSINGS)
DURAPREP 26ML APPLICATOR (WOUND CARE) ×2 IMPLANT
ELECT BLADE TIP CTD 4 INCH (ELECTRODE) ×2 IMPLANT
ELECT REM PT RETURN 15FT ADLT (MISCELLANEOUS) ×2 IMPLANT
FACESHIELD WRAPAROUND (MASK) ×8 IMPLANT
FACESHIELD WRAPAROUND OR TEAM (MASK) ×4 IMPLANT
GAUZE SPONGE 2X2 8PLY STRL LF (GAUZE/BANDAGES/DRESSINGS) ×1 IMPLANT
GLOVE BIOGEL M 7.0 STRL (GLOVE) IMPLANT
GLOVE BIOGEL PI IND STRL 7.5 (GLOVE) ×1 IMPLANT
GLOVE BIOGEL PI IND STRL 8.5 (GLOVE) ×1 IMPLANT
GLOVE BIOGEL PI INDICATOR 7.5 (GLOVE) ×6
GLOVE BIOGEL PI INDICATOR 8.5 (GLOVE) ×1
GLOVE ECLIPSE 8.0 STRL XLNG CF (GLOVE) ×4 IMPLANT
GLOVE ORTHO TXT STRL SZ7.5 (GLOVE) ×1 IMPLANT
GOWN STRL REUS W/TWL 2XL LVL3 (GOWN DISPOSABLE) ×2 IMPLANT
GOWN STRL REUS W/TWL LRG LVL3 (GOWN DISPOSABLE) ×5 IMPLANT
HANDPIECE INTERPULSE COAX TIP (DISPOSABLE)
HEAD CERAMIC 36 PLUS 8.5 12 14 (Hips) ×1 IMPLANT
LINER NEUTRAL 52X36X54 PLUS 4 (Liner) ×1 IMPLANT
MANIFOLD NEPTUNE II (INSTRUMENTS) ×2 IMPLANT
MARKER SKIN DUAL TIP RULER LAB (MISCELLANEOUS) ×2 IMPLANT
NDL SAFETY ECLIPSE 18X1.5 (NEEDLE) ×1 IMPLANT
NEEDLE HYPO 18GX1.5 SHARP (NEEDLE) ×2
NS IRRIG 1000ML POUR BTL (IV SOLUTION) ×4 IMPLANT
PADDING CAST COTTON 6X4 STRL (CAST SUPPLIES) ×1 IMPLANT
PRESSURIZER FEMORAL UNIV (MISCELLANEOUS) IMPLANT
PROTECTOR NERVE ULNAR (MISCELLANEOUS) ×2 IMPLANT
SET HNDPC FAN SPRY TIP SCT (DISPOSABLE) IMPLANT
SPONGE GAUZE 2X2 STER 10/PKG (GAUZE/BANDAGES/DRESSINGS) ×1
SPONGE LAP 18X18 RF (DISPOSABLE) ×2 IMPLANT
SPONGE LAP 4X18 RFD (DISPOSABLE) ×1 IMPLANT
STAPLER VISISTAT 35W (STAPLE) ×2 IMPLANT
STEM AML 18.0 STD 6.3 5/8 STD (Hips) ×1 IMPLANT
SUCTION FRAZIER HANDLE 10FR (MISCELLANEOUS) ×1
SUCTION FRAZIER HANDLE 12FR (TUBING) ×1
SUCTION TUBE FRAZIER 10FR DISP (MISCELLANEOUS) ×1 IMPLANT
SUCTION TUBE FRAZIER 12FR DISP (TUBING) IMPLANT
SUT STRATAFIX PDS+ 0 24IN (SUTURE) ×2 IMPLANT
SUT VIC AB 1 CT1 36 (SUTURE) ×2 IMPLANT
SUT VIC AB 2-0 CT1 27 (SUTURE) ×6
SUT VIC AB 2-0 CT1 TAPERPNT 27 (SUTURE) ×3 IMPLANT
TOWEL OR 17X26 10 PK STRL BLUE (TOWEL DISPOSABLE) ×4 IMPLANT
TOWER CARTRIDGE SMART MIX (DISPOSABLE) IMPLANT
TRAY FOLEY MTR SLVR 16FR STAT (SET/KITS/TRAYS/PACK) ×2 IMPLANT
TUBE KAMVAC SUCTION (TUBING) IMPLANT
WATER STERILE IRR 1000ML POUR (IV SOLUTION) ×2 IMPLANT
YANKAUER SUCT BULB TIP 10FT TU (MISCELLANEOUS) ×2 IMPLANT

## 2018-11-11 NOTE — Anesthesia Postprocedure Evaluation (Signed)
Anesthesia Post Note  Patient: Samuel Goodwin  Procedure(s) Performed: right total hip arthroplasty revision, femoral stem (Right )     Patient location during evaluation: PACU Anesthesia Type: General Level of consciousness: sedated Pain management: pain level controlled Vital Signs Assessment: post-procedure vital signs reviewed and stable Respiratory status: spontaneous breathing and respiratory function stable Cardiovascular status: stable Postop Assessment: no apparent nausea or vomiting Anesthetic complications: no    Last Vitals:  Vitals:   11/11/18 2124 11/11/18 2130  BP: (!) 148/86   Pulse: 88   Resp:    Temp:  37.2 C  SpO2: 100%     Last Pain:  Vitals:   11/11/18 2125  TempSrc:   PainSc: 2                  Aamari West DANIEL

## 2018-11-11 NOTE — Discharge Instructions (Signed)

## 2018-11-11 NOTE — Progress Notes (Signed)
[facility] PROGRESS NOTE  Samuel Goodwin NOB:[id number] DOB: Jul 17, 1961 DOA: 11/05/2018  PCP: [name], MD  Brief History/Interval Summary: 57 y.o. male with medical history significant for alcohol abuse, anxiety, depression, GERD, hep C s/p IFN in 1994, hypertension, hyperlipidemia, hypothyroidism, OSA presented to the hospital for evaluation of right hip pain. Patient stated he [place] walks [place] the park 4 miles daily.  When he was walking all of a sudden he heard a loud pop and sudden onset right hip pain which caused him to fall on the ground.  Evaluation showed fracture of the right femoral prosthesis.  Orthopedic was consulted.  Patient was hospitalized for further management. Due to holiday schedule patient could not be operated upon and needs to wait till Monday.  Due to his pain and immobility he could not be discharged home in the interim.  Reason for Visit: Right-sided femoral prosthesis fracture  Consultants: Orthopedics  Procedures: None yet  Antibiotics: None  Subjective/Interval History: Patient feels well.  He slept well overnight.  Constipation is resolved.  Looks forward to his surgery this afternoon.    ROS: No nausea or vomiting  Objective:  Vital Signs  Vitals:   11/10/18 2055 11/10/18 2229 11/11/18 0549 11/11/18 1015  BP:  119/83 (!) 137/91 128/73  Pulse:  65 65 68  Resp: 18 18 18 17   Temp:  98.2 F (36.8 C) 97.9 F (36.6 C) 99 F (37.2 C)  TempSrc:  Oral Oral Oral  SpO2:  96% 100% 98%  Weight:      Height:        Intake/Output Summary (Last 24 hours) at 11/11/2018 1147 Last data filed at 11/11/2018 1002 Gross per 24 hour  Intake 700 ml  Output 2000 ml  Net -1300 ml   Filed Weights   11/05/18 1743 11/05/18 2243 11/06/18 1059  Weight: 131.1 kg 131.1 kg 131.1 kg    General appearance: Awake alert.  In no distress Resp: Normal effort at rest.  Clear to auscultation bilaterally Cardio: S1-S2 is normal regular.  No S3-S4.  No  rubs murmurs or bruit GI: Abdomen is soft.  Nontender nondistended Extremities: Decreased mobility of the right lower extremity due to fracture Neurologic: No focal neurological deficits.  Lab Results:  Data Reviewed: I have personally reviewed following labs and imaging studies  CBC: Recent Labs  Lab 11/05/18 1751 11/07/18 0501 11/10/18 0355  WBC 7.6 6.6 7.2  HGB 12.7* 13.4 13.4  HCT 37.9* 40.4 40.2  MCV 89.0 89.4 90.3  PLT 179 199 947    Basic Metabolic Panel: Recent Labs  Lab 11/05/18 1751 11/07/18 0501 11/10/18 0355  NA 138 141 136  K 3.9 4.5 4.0  CL 103 105 100  CO2 26 29 30   GLUCOSE 99 102* 127*  BUN 20 14 15   CREATININE 0.81 0.82 0.79  CALCIUM 8.3* 8.4* 8.3*    GFR: Estimated Creatinine Clearance: 144.4 mL/min (by C-G formula based on SCr of 0.79 mg/dL).  Coagulation Profile: Recent Labs  Lab 11/05/18 1919  INR 0.92     Radiology Studies: No results found.   Medications:  Scheduled: . docusate sodium  100 mg Oral BID  . fluticasone  1 spray Each Nare Daily  . levothyroxine  250 mcg Oral QAC breakfast  . pantoprazole  40 mg Oral Daily  . polyethylene glycol  17 g Oral BID  . povidone-iodine  2 application Topical Once  . pravastatin  40 mg Oral q1800   Continuous: .  ceFAZolin (  ANCEF) IV     FIE:PPIRJJOACZYSA, albuterol, HYDROcodone-acetaminophen, morphine injection, ondansetron (ZOFRAN) IV    Assessment/Plan:  Fracture of the right femoral prosthesis Orthopedics was consulted.  Patient had to stay in the hospital as surgery could not be done last week due to holidays.  Plan is for surgery this afternoon.  Pain is well controlled.       Constipation Continue MiraLAX.  Constipation was relieved with a large bowel movement day before yesterday.     Essential hypertension Blood pressure is reasonably well controlled.  Holding his hydrochlorothiazide and losartan.  Remains stable  Normocytic anemia Hemoglobin has normalized.  Check  after surgery.  History of reactive airway disease Currently stable.  No wheezing appreciated.  History of hypothyroidism Continue home dose of Synthroid.  Due to constipation we will check TSH and free T4 with next labs on 12/3.  History of GERD PPI  DVT Prophylaxis: Lovenox    Code Status: Full code Family Communication: Discussed with the patient Disposition Plan: Plan is for surgical intervention today.  Patient remains stable.    LOS: 6 days   [facility] Pager [phone] 11/11/2018, 11:47 AM  If 7PM-7AM, please contact night-coverage at [email or web address], password [facility]

## 2018-11-11 NOTE — Anesthesia Preprocedure Evaluation (Addendum)
Anesthesia Evaluation  Patient identified by MRN, date of birth, ID band Patient awake    Reviewed: Allergy & Precautions, NPO status , Patient's Chart, lab work & pertinent test results  History of Anesthesia Complications (+) PONV and history of anesthetic complications  Airway Mallampati: III  TM Distance: >3 FB Neck ROM: Full    Dental  (+) Teeth Intact, Dental Advisory Given   Pulmonary sleep apnea ,    breath sounds clear to auscultation       Cardiovascular hypertension,  Rhythm:Regular Rate:Normal     Neuro/Psych  Headaches, PSYCHIATRIC DISORDERS Anxiety Depression    GI/Hepatic Neg liver ROS, GERD  ,  Endo/Other  Hypothyroidism   Renal/GU negative Renal ROS     Musculoskeletal   Abdominal (+) + obese,   Peds  Hematology   Anesthesia Other Findings   Reproductive/Obstetrics                             Anesthesia Physical  Anesthesia Plan  ASA: III  Anesthesia Plan: General   Post-op Pain Management:    Induction: Intravenous  PONV Risk Score and Plan: 3 and Ondansetron and Dexamethasone  Airway Management Planned: Oral ETT  Additional Equipment:   Intra-op Plan:   Post-operative Plan: Extubation in OR  Informed Consent: I have reviewed the patients History and Physical, chart, labs and discussed the procedure including the risks, benefits and alternatives for the proposed anesthesia with the patient or authorized representative who has indicated his/her understanding and acceptance.   Dental advisory given  Plan Discussed with: CRNA, Anesthesiologist and Surgeon  Anesthesia Plan Comments:        Anesthesia Quick Evaluation

## 2018-11-11 NOTE — Anesthesia Procedure Notes (Signed)
Procedure Name: Intubation Date/Time: 11/11/2018 4:37 PM Performed by: Mitzie Na, CRNA Pre-anesthesia Checklist: Patient identified, Emergency Drugs available, Suction available, Patient being monitored and Timeout performed Patient Re-evaluated:Patient Re-evaluated prior to induction Oxygen Delivery Method: Circle system utilized Preoxygenation: Pre-oxygenation with 100% oxygen Induction Type: IV induction Ventilation: Oral airway inserted - appropriate to patient size and Two handed mask ventilation required Laryngoscope Size: Glidescope and 4 Grade View: Grade I Tube type: Oral Tube size: 7.5 mm Number of attempts: 1 Airway Equipment and Method: Video-laryngoscopy Placement Confirmation: ETT inserted through vocal cords under direct vision,  positive ETCO2 and breath sounds checked- equal and bilateral Secured at: 25 cm Tube secured with: Tape Dental Injury: Teeth and Oropharynx as per pre-operative assessment

## 2018-11-11 NOTE — Progress Notes (Signed)
Patient ID: Samuel Goodwin, male   DOB: 1961-03-13, 57 y.o.   MRN: [id number]  Doing ok. Ready to get this over with  Broken femoral stem  To OR today for revision Right THR, femoral stem NPO  Consent signed

## 2018-11-11 NOTE — Transfer of Care (Signed)
Immediate Anesthesia Transfer of Care Note  Patient: Samuel Goodwin  Procedure(s) Performed: right total hip arthroplasty revision, femoral stem (Right )  Patient Location: PACU  Anesthesia Type:General  Level of Consciousness: awake, alert , oriented and patient cooperative  Airway & Oxygen Therapy: Patient Spontanous Breathing and Patient connected to face mask oxygen  Post-op Assessment: Report given to RN and Post -op Vital signs reviewed and stable  Post vital signs: Reviewed and stable  Last Vitals:  Vitals Value Taken Time  BP    Temp 36.8 C 11/11/2018  8:45 PM  Pulse 98 11/11/2018  8:46 PM  Resp 15 11/11/2018  8:46 PM  SpO2 100 % 11/11/2018  8:46 PM  Vitals shown include unvalidated device data.  Last Pain:  Vitals:   11/11/18 1430  TempSrc: Oral  PainSc:       Patients Stated Pain Goal: 1 (30/94/07 6808)  Complications: No apparent anesthesia complications

## 2018-11-12 ENCOUNTER — Encounter (HOSPITAL_COMMUNITY): Payer: Self-pay | Admitting: Orthopedic Surgery

## 2018-11-12 LAB — BASIC METABOLIC PANEL
Anion gap: 9 (ref 5–15)
BUN: 16 mg/dL (ref 6–20)
CO2: 24 mmol/L (ref 22–32)
CREATININE: 0.76 mg/dL (ref 0.61–1.24)
Calcium: 8.2 mg/dL — ABNORMAL LOW (ref 8.9–10.3)
Chloride: 103 mmol/L (ref 98–111)
GFR calc Af Amer: >60 mL/min (ref >60)
GFR calc non Af Amer: >60 mL/min (ref >60)
Glucose, Bld: 177 mg/dL — ABNORMAL HIGH (ref 70–99)
Potassium: 4.5 mmol/L (ref 3.5–5.1)
Sodium: 136 mmol/L (ref 135–145)

## 2018-11-12 LAB — CBC
HCT: 33.6 % — ABNORMAL LOW (ref 39.0–52.0)
Hemoglobin: 11.4 g/dL — ABNORMAL LOW (ref 13.0–17.0)
MCH: 30 pg (ref 26.0–34.0)
MCHC: 33.9 g/dL (ref 30.0–36.0)
MCV: 88.4 fL (ref 80.0–100.0)
Platelets: 239 10*3/uL (ref 150–400)
RBC: 3.8 MIL/uL — ABNORMAL LOW (ref 4.22–5.81)
RDW: 12.4 % (ref 11.5–15.5)
WBC: 11.5 10*3/uL — ABNORMAL HIGH (ref 4.0–10.5)
nRBC: 0 % (ref 0.0–0.2)

## 2018-11-12 LAB — TSH: TSH: 1.321 u[IU]/mL (ref 0.350–4.500)

## 2018-11-12 LAB — T4, FREE: Free T4: 1.02 ng/dL (ref 0.82–1.77)

## 2018-11-12 MED ORDER — MUPIROCIN 2 % EX OINT
1.0000 "application " | TOPICAL_OINTMENT | Freq: Two times a day (BID) | CUTANEOUS | Status: AC
  Administered 2018-11-12 – 2018-11-16 (×10): 1 via NASAL
  Filled 2018-11-12 (×2): qty 22

## 2018-11-12 MED ORDER — DEXAMETHASONE 4 MG PO TABS
10.0000 mg | ORAL_TABLET | Freq: Once | ORAL | Status: AC
  Administered 2018-11-12: 10 mg via ORAL
  Filled 2018-11-12: qty 3

## 2018-11-12 MED ORDER — CHLORHEXIDINE GLUCONATE CLOTH 2 % EX PADS
6.0000 | MEDICATED_PAD | Freq: Every day | CUTANEOUS | Status: AC
  Administered 2018-11-12 – 2018-11-16 (×5): 6 via TOPICAL

## 2018-11-12 NOTE — Op Note (Signed)
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  PROCEDURE:  Revision right total hip replacement.  COMPONENTS USED:  DePuy implant system with a size 54 x 30 6+4, 10 degree face changing liner, a size 18 mm large stature AML stem with a 36+8.5 delta ceramic ball.  SURGEON:  Paralee Cancel, MD  ASSISTANT:  Danae Orleans PA-C.  Note that Ms. Babish was present for the entirety of the case and preoperative positioning, perioperative management of the operative extremity, general facilitation of the case and primary wound closure.  ANESTHESIA:  General.  SPECIMENS:  His femoral component and the broken trunnion and femoral head were sent to pathology for review per family request.  ANESTHESIA:  General.  DRAINS:  None.  BLOOD LOSS:  One  liter.  COMPLICATIONS:  None apparent.  INDICATIONS FOR THE PROCEDURE:  The patient is a very pleasant 57 year old male.  He has been a patient of mine for some time.  He has had a history of right total hip replacement in addition to a left total hip replacement and a left total knee  replacement.  His right total hip replacement was complicated by early dislocation.  This required revision surgery based on the identification and time of closed reduction attempts, that the hip was unstable.  His family reminded me that I told them  that when we got in there to do the revision of his hip, that it was difficult for me to dislocate the hip intraoperatively.  Nonetheless, he was revised to a +8.5 ball from a +5 ball and an anterior approach.  Following that  procedure, he recovered from  the hip replacement itself in addition to his other procedures.  He was last seen in October this year and then stated that it was best he felt in months.  We were very happy for him.  Unfortunately, prior to the day of his admission, he was walking in  the park which was his routine when he felt and heard a pop in his right hip.  He was brought to the emergency room based on the inability to bear weight.  At that time, it was identified that he had a break of the trunnion of the component.  The  etiology of this is uncertain, but likely related to trauma from his dislocation.  I told his family that this is a rare event despite that situation.  Nonetheless, revision surgery was indicated.  We reviewed the risks of dislocation, DVT, component  failure, need for future surgeries.  We discussed postoperative course and expectations  based on intraoperative findings.  Consent was obtained for benefit of pain relief and restoration of function.  DESCRIPTION OF PROCEDURE:  The patient was brought to the operative theater.  Once adequate anesthesia, preoperative antibiotics, Ancef 3 grams administered as well as tranexamic acid, he was positioned into the left lateral decubitus position with the  right hip up.  His right lower extremity was prescrubbed and prepped and draped in sterile fashion.  A timeout was performed identifying the patient, the planned procedure and extremity.  We elected to do this through a posterior approach was demarcated  on the skin.  The incision was made and  soft tissue dissection was carried down to the fascia.  The fascia of the iliotibial band and gluteal fascia were then incised for posterior approach.  The posterior aspect of his hip was exposed, encountering  clear synovial fluid.  Once I had the posterior aspect of the hip was exposed, I was able to remove the femoral head and trunnion piece.  This was placed on the back table to send as specimen.   Following further exposure and soft tissue release along the  proximal femur to allow for maximal exposure of the proximal femur, I began working as I told him preoperatively to get the stem out proximally as opposed to create an ET (extended trochanteric) osteotomy due to the morbidity that comes with this.  We  used drill bits and thin flexible osteotomes and worked around the proximal aspect of the stem.  Several attempts at removing it using apparatuses that we had available.  I did at one point feel that I saw his components removed.  Thus, we reattached the extraction device we had configured and with several large blows, I removed the component.  With this came some bone along the medial aspect underneath the medial curve  of the prosthesis as well as laterally just underneath the lateral shoulder.  These seem to be slivers of bone and did not affect the integrity of his femoral canal.  Once this component was out, I reamed with AML reamers.  We reamed up to 17.5 mm, where  I got good bone contact and reaming.  I then broached with a 16.5 mm small stature and then large stature and then the 18 mm small and then large stature.  At this point, we placed a large stature neck and a 36, 1.5 ball.  We reduced the hip.  At this  point, I used a portable x-ray and took an x-ray and found that the  femur itself was intact and the prosthesis had good fill of the broach and had a good seal.  Given these findings, I elected, based on the procedure performed, to remove his  polyethylene liner.  We dislocated the hip, removed the prosthesis, the broach and then proceeded to extract his liner and replaced this based on his history of dislocation with a 36+4, 10 degree face changing liner with the lip portion at about 10  o'clock  for this right hip.  Once this was in place, we went to a size 18, large stature AML stem.  It was then subsequently impacted maintaining an orientation of anteversion about 20 degrees on  the femoral stem until it was well seated a couple of  millimeters off of his calcar.  At this point,  I retrialed out first with a 36+5 ball.  Here I found that his hip was stable.  There was no evidence of impingement.  I then in extension found there was a little bit of shock and evaluated his leg lengths  based on his down leg, felt that the 8.5 ball would be better to restore offset and length instability.  Given this, the final 36+8.5 delta ceramic ball was opened.  It was then impacted onto a clean and dried trunnion.  We irrigated the hip with pulse  lavage, normal saline solution.  There was no posterior capsule to repair.  We then reapproximated the iliotibial band and gluteal fascia using #1 Vicryl and Stratafix sutures.  The remainder of the wound was closed with 2-0 Vicryl and a running Monocryl  stitch.  The hip was then cleaned, dried and dressed sterilely with surgical glue and Aquacel dressing.  He was then brought to the recovery room in stable condition, tolerating the procedure well.  The findings were reviewed with his family.  He had a  discussion regarding the etiology of a trunnion breakage likely related to trauma from his dislocation.  Nonetheless, they wanted the components evaluated by pathology.    Postoperatively, I am going to have him be partial weightbearing for 4-6 weeks to make sure we get bony ingrowth into his components before we allow him to restore normal activity.  These findings and plans will be reviewed with him in the morning.  AN/NUANCE  D:11/11/2018 T:11/12/2018 JOB:[id number]

## 2018-11-12 NOTE — Plan of Care (Signed)
Plan of care reviewed and discussed with patient and spouse.

## 2018-11-12 NOTE — Progress Notes (Addendum)
Patient ID: Samuel Goodwin, male   DOB: [birth year], 57 y.o.   MRN: [id number] Subjective: 1 Day Post-Op Procedure(s) (LRB): right total hip arthroplasty revision, femoral stem (Right)    Patient reports pain as mild to moderate but he hasn't really done much since last night's procedure.  At least comfortable this am.  No acute distress  Objective:   VITALS:   Vitals:   11/12/18 0111 11/12/18 0550  BP: (!) 149/95 (!) 148/98  Pulse: 98 90  Resp: 17 17  Temp: 98.3 F (36.8 C) 98 F (36.7 C)  SpO2: 93% 99%    Neurovascular intact Incision: dressing C/D/I  LABS Recent Labs    11/10/18 0355 11/12/18 0339  HGB 13.4 11.4*  HCT 40.2 33.6*  WBC 7.2 11.5*  PLT 181 239    Recent Labs    11/10/18 0355 11/12/18 0339  NA 136 136  K 4.0 4.5  BUN 15 16  CREATININE 0.79 0.76  GLUCOSE 127* 177*    No results for input(s): LABPT, INR in the last 72 hours.   Assessment/Plan: 1 Day Post-Op Procedure(s) (LRB): right total hip arthroplasty revision, femoral stem (Right)   Advance diet Up with therapy  PWB RLE Posterior hip precautions  Reviewed the procedure, intra-operative findings, in house plans and discharge goals with him and his wife

## 2018-11-12 NOTE — Evaluation (Signed)
Physical Therapy Re-Evaluation Patient Details Name: Samuel Goodwin MRN: [id number] DOB: 01/22/1961 Today's Date: 11/12/2018   History of Present Illness  57 yo male admitted 11/26 for fracture of R femoral prosthesis at femoral neck. Pt s/p R THA revision on 11/11/18, PWB 50% with posterior precautions. PMH includes alcohol abuse, anxiety, OA, vertigo, depression, cancer, hep C, HLD, HTN, OSA, PTSD, L THA 2018, R THA 2017 with attempted reduction after dislocation and subsequent revision, HLD, L TKR 2015.  Clinical Impression   Pt presents with severe R hip pain, difficulty performing bed mobility/transfers/gait, decreased tolerance for ambulation, and decreased knowledge of posterior hip and WB precautions. Pt to benefit from acute PT to address deficits. Pt ambulated 10 ft with min assist +2 for steadying, required return to room with recliner due to bilateral UE fatigue and RLE pain. Pt educated on quad sets (5-10/hour), ankle pumps (20/hour), and heel slides (5-10/hour) to perform this afternoon/evening to lessen stiffness and increase circulation, to pt's tolerance and limited by pain. PT to progress mobility as tolerated, and will continue to follow acutely.      Follow Up Recommendations Supervision for mobility/OOB;Home health PT    Equipment Recommendations  None recommended by PT    Recommendations for Other Services       Precautions / Restrictions Precautions Precautions: Fall;Posterior Hip Precaution Booklet Issued: Yes (comment) Precaution Comments: Pt given posterior precautions handout, thoroughly reviewed at start, during, and at end of session Restrictions Weight Bearing Restrictions: Yes RLE Weight Bearing: Partial weight bearing RLE Partial Weight Bearing Percentage or Pounds: 50%      Mobility  Bed Mobility Overal bed mobility: Needs Assistance Bed Mobility: Supine to Sit     Supine to sit: Mod assist;HOB elevated     General bed mobility comments: Mod  assist for RLE management, scooting to EOB, reinforcing posterior hip precautions with sitting EOB.   Transfers Overall transfer level: Needs assistance Equipment used: Rolling walker (2 wheeled) Transfers: Sit to/from Stand Sit to Stand: Mod assist;+2 physical assistance;From elevated surface         General transfer comment: Mod assist +2 for power up, steadying upon standing, reinforcing PWB precautions by telling pt to place RLE in front of LLE and to bring LLE underneath himself for standing. Pt with increased time and effort to come to standing due to pain.   Ambulation/Gait Ambulation/Gait assistance: Min assist;+2 safety/equipment;+2 physical assistance Gait Distance (Feet): 10 Feet Assistive device: Rolling walker (2 wheeled) Gait Pattern/deviations: Step-to pattern;Decreased stride length;Decreased weight shift to right;Decreased stance time - right;Antalgic;Trunk flexed Gait velocity: decr    General Gait Details: Pt with high pain level with gait. PT reinforced WB precautions (push walker forward, place R foot forward, use strong arms to offweight RLE and step-to the R foot with the L), posterior precautions throughout. Min assist +2 for steadying.   Stairs            Wheelchair Mobility    Modified Rankin (Stroke Patients Only)       Balance Overall balance assessment: Needs assistance Sitting-balance support: Bilateral upper extremity supported Sitting balance-Leahy Scale: Fair     Standing balance support: Bilateral upper extremity supported Standing balance-Leahy Scale: Poor Standing balance comment: relies on RW and PT steadying for balance                              Pertinent Vitals/Pain Pain Assessment: Faces Faces Pain Scale: Hurts whole  lot Pain Location: R hip and RLE, with mobility  Pain Descriptors / Indicators: Sore;Tender Pain Intervention(s): Limited activity within patient's tolerance;Repositioned;Monitored during  session;Premedicated before session    Home Living Family/patient expects to be discharged to:: Private residence Living Arrangements: Spouse/significant other;Children Available Help at Discharge: Family;Available PRN/intermittently Type of Home: House Home Access: Stairs to enter Entrance Stairs-Rails: None Entrance Stairs-Number of Steps: 1 Home Layout: One level Home Equipment: Shower seat;Bedside commode;Walker - 2 wheels;Cane - single point Additional Comments: Pt unable to navigate bathroom with use of AD.     Prior Function Level of Independence: Independent               Hand Dominance   Dominant Hand: Right    Extremity/Trunk Assessment   Upper Extremity Assessment Upper Extremity Assessment: Overall WFL for tasks assessed    Lower Extremity Assessment Lower Extremity Assessment: Generalized weakness;RLE deficits/detail RLE Deficits / Details: suspected post-surgical and post-bedrest weakness; able to perform quad set, ankle pumps. SLR not attempted due to pt's high pain level.  RLE: Unable to fully assess due to pain RLE Sensation: WNL    Cervical / Trunk Assessment Cervical / Trunk Assessment: Normal  Communication   Communication: No difficulties  Cognition Arousal/Alertness: Awake/alert Behavior During Therapy: WFL for tasks assessed/performed;Anxious Overall Cognitive Status: Within Functional Limits for tasks assessed                                 General Comments: Pt anxious reviewing precautions about the possibility of dislocation       General Comments      Exercises     Assessment/Plan    PT Assessment Patient needs continued PT services  PT Problem List Decreased strength;Pain;Decreased range of motion;Decreased activity tolerance;Decreased knowledge of use of DME;Decreased balance;Decreased safety awareness;Decreased mobility;Decreased knowledge of precautions       PT Treatment Interventions DME  instruction;Therapeutic activities;Gait training;Therapeutic exercise;Patient/family education;Stair training;Balance training;Functional mobility training    PT Goals (Current goals can be found in the Care Plan section)  Acute Rehab PT Goals Patient Stated Goal: decrease pain  PT Goal Formulation: With patient Time For Goal Achievement: 11/20/18 Potential to Achieve Goals: Good    Frequency 7X/week   Barriers to discharge        Co-evaluation               AM-PAC PT "6 Clicks" Mobility  Outcome Measure Help needed turning from your back to your side while in a flat bed without using bedrails?: A Lot Help needed moving from lying on your back to sitting on the side of a flat bed without using bedrails?: A Lot Help needed moving to and from a bed to a chair (including a wheelchair)?: A Little Help needed standing up from a chair using your arms (e.g., wheelchair or bedside chair)?: A Little Help needed to walk in hospital room?: A Little Help needed climbing 3-5 steps with a railing? : A Lot 6 Click Score: 15    End of Session Equipment Utilized During Treatment: Gait belt Activity Tolerance: Patient limited by pain Patient left: in chair;with call bell/phone within reach;with family/visitor present;with SCD's reapplied(Pt verbally agrees to press call button and wait for NT or RN to assist back to bed) Nurse Communication: Mobility status;Precautions;Weight bearing status(RN informed of posterior precautions ) PT Visit Diagnosis: Other abnormalities of gait and mobility (R26.89);Difficulty in walking, not elsewhere classified (R26.2);Pain Pain -  Right/Left: Right Pain - part of body: Hip    Time: 2446-9507 PT Time Calculation (min) (ACUTE ONLY): 30 min   Charges:   PT Evaluation $PT Re-evaluation: 1 Re-eval PT Treatments $Gait Training: 8-22 mins        [name], PT Acute Rehabilitation Services Pager [phone]  Office [phone]   Novali Vollman D  Elonda Husky 11/12/2018, 12:56 PM

## 2018-11-12 NOTE — Progress Notes (Signed)
Pt suppose to receive 10mg  IV dexamethasone this evening, but pts IV access was lost. Called on call provider for [facility]. Received a call back from [employer]. Okay to give 1 dose of oral dexamethasone same dose in place of IV dexamethasone.

## 2018-11-12 NOTE — Progress Notes (Signed)
[facility] PROGRESS NOTE  Samuel Goodwin BCW:[id number] DOB: 1961/07/20 DOA: 11/05/2018  PCP: [name], MD  Brief History/Interval Summary: 57 y.o. male with medical history significant for alcohol abuse, anxiety, depression, GERD, hep C s/p IFN in 1994, hypertension, hyperlipidemia, hypothyroidism, OSA presented to the hospital for evaluation of right hip pain. Patient stated he normally walks at the park 4 miles daily.  When he was walking all of a sudden he heard a loud pop and sudden onset right hip pain which caused him to fall on the ground.  Evaluation showed fracture of the right femoral prosthesis.  Orthopedic was consulted.  Patient was hospitalized for further management. Due to holiday schedule patient could not be operated upon and needs to wait till Monday.  Due to his pain and immobility he could not be discharged home in the interim.  Patient subsequently underwent surgery on 12/2.  Reason for Visit: Right-sided femoral prosthesis fracture  Consultants: Orthopedics  Procedures: Revision right total hip replacement 12/2  Antibiotics: None  Subjective/Interval History: Patient noted to be sitting at the side of the bed.  He states that he feels well.  Has not been seen by physical therapy yet.  Pain is reasonably well controlled.  Denies any shortness of breath.     ROS: No nausea or vomiting  Objective:  Vital Signs  Vitals:   11/11/18 2356 11/12/18 0111 11/12/18 0550 11/12/18 0951  BP: (!) 143/93 (!) 149/95 (!) 148/98 (!) 145/82  Pulse: 98 98 90 95  Resp: 17 17 17 16   Temp:  98.3 F (36.8 C) 98 F (36.7 C) 98.2 F (36.8 C)  TempSrc:   Oral Oral  SpO2: 94% 93% 99% 98%  Weight:      Height:        Intake/Output Summary (Last 24 hours) at 11/12/2018 1015 Last data filed at 11/12/2018 0930 Gross per 24 hour  Intake 4532.19 ml  Output 2750 ml  Net 1782.19 ml   Filed Weights   11/05/18 2243 11/06/18 1059 11/11/18 1430  Weight: 131.1 kg  131.1 kg 131 kg    General appearance: He is awake alert.  In no distress. Resp: Normal effort at rest.  Clear to auscultation bilaterally Cardio: S1-S2 is noted to be tachycardic regular.  No S3-S4. GI: Abdomen is soft.  Nontender nondistended Extremities: Dressing noted over the right lateral thigh Neurologic: No obvious focal neurological deficits  Lab Results:  Data Reviewed: I have personally reviewed following labs and imaging studies  CBC: Recent Labs  Lab 11/05/18 1751 11/07/18 0501 11/10/18 0355 11/12/18 0339  WBC 7.6 6.6 7.2 11.5*  HGB 12.7* 13.4 13.4 11.4*  HCT 37.9* 40.4 40.2 33.6*  MCV 89.0 89.4 90.3 88.4  PLT 179 199 181 038    Basic Metabolic Panel: Recent Labs  Lab 11/05/18 1751 11/07/18 0501 11/10/18 0355 11/12/18 0339  NA 138 141 136 136  K 3.9 4.5 4.0 4.5  CL 103 105 100 103  CO2 26 29 30 24   GLUCOSE 99 102* 127* 177*  BUN 20 14 15 16   CREATININE 0.81 0.82 0.79 0.76  CALCIUM 8.3* 8.4* 8.3* 8.2*    GFR: Estimated Creatinine Clearance: 144.4 mL/min (by C-G formula based on SCr of 0.76 mg/dL).  Coagulation Profile: Recent Labs  Lab 11/05/18 1919  INR 0.92     Radiology Studies: Dg Pelvis Portable  Result Date: 11/11/2018 CLINICAL DATA:  57 year old male with right hip revision. EXAM: PORTABLE PELVIS 1-2 VIEWS COMPARISON:  Earlier radiograph  dated 11/11/2018 FINDINGS: There are bilateral total hip arthroplasties. The arthroplasty components appear intact and in anatomic alignment. The previously seen bone fragments medial to the proximal right femur are no longer visualized. There is no acute fracture or dislocation. Postsurgical changes the soft tissues of the right hip. IMPRESSION: Bilateral total hip arthroplasties. No acute fracture or dislocation. Electronically Signed   By: Anner Crete M.D.   On: 11/11/2018 21:24   Dg Hip Port Unilat With Pelvis 1v Right  Result Date: 11/11/2018 CLINICAL DATA:  Intraop right hip revision EXAM:  DG HIP (WITH OR WITHOUT PELVIS) 1V PORT RIGHT COMPARISON:  Hip radiograph 11/05/2018 FINDINGS: Single portable AP radiograph demonstrates replacement of the right hip hardware. No definite displaced fracture on current exam. Suspected drain projecting over the right superior thigh soft tissues. IMPRESSION: Patient status post right hip arthroplasty. Electronically Signed   By: Lovey Newcomer M.D.   On: 11/11/2018 19:50   Dg Hip Operative Unilat W Or W/o Pelvis Right  Result Date: 11/11/2018 CLINICAL DATA:  Right hip revision EXAM: OPERATIVE RIGHT HIP (WITH PELVIS IF PERFORMED) 1 VIEWS TECHNIQUE: Fluoroscopic spot image(s) were submitted for interpretation post-operatively. COMPARISON:  11/05/2018 FINDINGS: Revision of the femoral component of the right hip replacement. Bone fragments are noted medial to the proximal femoral shaft. Exact source not visible on this single intraoperative image. IMPRESSION: Revision of the femoral component of the right hip replacement. Small bone fragments are noted medial to the proximal femoral shaft. Electronically Signed   By: Rolm Baptise M.D.   On: 11/11/2018 20:20     Medications:  Scheduled: . aspirin EC  325 mg Oral BID  . Chlorhexidine Gluconate Cloth  6 each Topical Daily  . dexamethasone  10 mg Intravenous Once  . docusate sodium  100 mg Oral BID  . ferrous sulfate  325 mg Oral TID PC  . fluticasone  1 spray Each Nare Daily  . levothyroxine  250 mcg Oral QAC breakfast  . mupirocin ointment  1 application Nasal BID  . pantoprazole  40 mg Oral Daily  . polyethylene glycol  17 g Oral BID  . pravastatin  40 mg Oral q1800   Continuous: . sodium chloride 20 mL/hr at 11/12/18 0700  . methocarbamol (ROBAXIN) IV     MGQ:[id number], albuterol, alum & mag hydroxide-simeth, bisacodyl, diphenhydrAMINE, HYDROcodone-acetaminophen, magnesium citrate, menthol-cetylpyridinium **OR** phenol, methocarbamol **OR** methocarbamol (ROBAXIN) IV, metoCLOPramide **OR**  metoCLOPramide (REGLAN) injection, morphine injection, ondansetron (ZOFRAN) IV    Assessment/Plan:  Fracture of the right femoral prosthesis Orthopedics was consulted.  Patient had to stay in the hospital as surgery could not be done last week due to holidays.  She underwent surgery yesterday.  Seems to be doing well.  He is noted to be tachycardic this morning but he denies any chest pain or shortness of breath.  Most likely due to activity.  Monitor for now.  PT and OT evaluation.  Pain control.     Constipation  Constipation was relieved with a large bowel movement 3 days ago. Continue MiraLAX.   Essential hypertension Holding his hydrochlorothiazide and losartan.  Blood pressures have been stable.  Slight elevation most likely due to pain issues.  Normocytic anemia Mild drop in hemoglobin is likely related to operative loss.  Recheck again tomorrow.  History of reactive airway disease Currently stable.  No wheezing appreciated.  History of hypothyroidism Continue home dose of Synthroid.  TSH and free T4 levels normal.   History of GERD PPI  DVT  Prophylaxis: Lovenox    Code Status: Full code Family Communication: Discussed with the patient Disposition Plan: Patient is stable after his surgery.  PT and OT evaluation.  Disposition yet to be determined.    LOS: 7 days   [facility] Pager [phone] 11/12/2018, 10:15 AM  If 7PM-7AM, please contact night-coverage at [email or web address], password [facility]

## 2018-11-13 DIAGNOSIS — S7291XA Unspecified fracture of right femur, initial encounter for closed fracture: Secondary | ICD-10-CM

## 2018-11-13 DIAGNOSIS — I10 Essential (primary) hypertension: Secondary | ICD-10-CM

## 2018-11-13 DIAGNOSIS — S72009A Fracture of unspecified part of neck of unspecified femur, initial encounter for closed fracture: Secondary | ICD-10-CM

## 2018-11-13 DIAGNOSIS — E039 Hypothyroidism, unspecified: Secondary | ICD-10-CM

## 2018-11-13 DIAGNOSIS — D62 Acute posthemorrhagic anemia: Secondary | ICD-10-CM

## 2018-11-13 HISTORY — DX: Fracture of unspecified part of neck of unspecified femur, initial encounter for closed fracture: S72.009A

## 2018-11-13 LAB — CBC
HCT: 28.8 % — ABNORMAL LOW (ref 39.0–52.0)
Hemoglobin: 9.5 g/dL — ABNORMAL LOW (ref 13.0–17.0)
MCH: 29.3 pg (ref 26.0–34.0)
MCHC: 33 g/dL (ref 30.0–36.0)
MCV: 88.9 fL (ref 80.0–100.0)
Platelets: 192 10*3/uL (ref 150–400)
RBC: 3.24 MIL/uL — AB (ref 4.22–5.81)
RDW: 12.7 % (ref 11.5–15.5)
WBC: 12.1 10*3/uL — ABNORMAL HIGH (ref 4.0–10.5)
nRBC: 0 % (ref 0.0–0.2)

## 2018-11-13 LAB — BASIC METABOLIC PANEL
Anion gap: 6 (ref 5–15)
BUN: 16 mg/dL (ref 6–20)
CO2: 27 mmol/L (ref 22–32)
Calcium: 7.8 mg/dL — ABNORMAL LOW (ref 8.9–10.3)
Chloride: 103 mmol/L (ref 98–111)
Creatinine, Ser: 0.66 mg/dL (ref 0.61–1.24)
GFR calc Af Amer: >60 mL/min (ref >60)
GFR calc non Af Amer: >60 mL/min (ref >60)
Glucose, Bld: 151 mg/dL — ABNORMAL HIGH (ref 70–99)
POTASSIUM: 4.5 mmol/L (ref 3.5–5.1)
Sodium: 136 mmol/L (ref 135–145)

## 2018-11-13 MED ORDER — METHOCARBAMOL 500 MG PO TABS: 500.0000 mg | ORAL_TABLET | Freq: Four times a day (QID) | ORAL | 0 refills | Status: DC | PRN

## 2018-11-13 MED ORDER — DOCUSATE SODIUM 100 MG PO CAPS: 100.0000 mg | ORAL_CAPSULE | Freq: Two times a day (BID) | ORAL | 0 refills | Status: DC

## 2018-11-13 MED ORDER — HYDROCODONE-ACETAMINOPHEN 7.5-325 MG PO TABS: 1.0000 | ORAL_TABLET | ORAL | 0 refills | Status: DC | PRN

## 2018-11-13 MED ORDER — ASPIRIN 81 MG PO CHEW: 81.0000 mg | CHEWABLE_TABLET | Freq: Two times a day (BID) | ORAL | 0 refills | Status: AC

## 2018-11-13 MED ORDER — POLYETHYLENE GLYCOL 3350 17 G PO PACK: 17.0000 g | PACK | Freq: Two times a day (BID) | ORAL | 0 refills | Status: DC

## 2018-11-13 MED ORDER — FERROUS SULFATE 325 (65 FE) MG PO TABS: 325.0000 mg | ORAL_TABLET | Freq: Three times a day (TID) | ORAL | 3 refills | Status: DC

## 2018-11-13 MED ORDER — DICLOFENAC SODIUM 1 % TD GEL
2.0000 g | Freq: Four times a day (QID) | TRANSDERMAL | Status: DC
  Administered 2018-11-13 – 2018-11-17 (×17): 2 g via TOPICAL
  Filled 2018-11-13: qty 100

## 2018-11-13 NOTE — Progress Notes (Signed)
Patient ID: Samuel Goodwin, male   DOB: 1961/10/19, 57 y.o.   MRN: [id number]  PROGRESS NOTE    ANES RIGEL  JOA:[id number] DOB: 06/02/1961 DOA: 11/05/2018 PCP: [name], MD   Brief Narrative:  57 year old male with history of alcohol abuse, anxiety, depression, GERD, hep C status post interferon in 1994, hypertension, hyperlipidemia, hypothyroidism, OSA presented for evaluation of right hip pain on 11/05/2018.  He was found to have fracture of the right femoral prosthesis and underwent surgery on 11/11/2018 by orthopedics.   Assessment & Plan:   Principal Problem:   Femoral fracture (HCC) Active Problems:   Hypothyroidism   HTN (hypertension)   GERD (gastroesophageal reflux disease)   Chronic anemia   Reactive airway disease   Hip fracture (HCC)   Fracture of right femoral prosthesis -Status post surgery on 11/11/2018 -Orthopedics following. -Continue PT/OT evaluation -Blood pain management -Fall precautions  Constipation -Resolved.  Continue bowel regimen  Essential hypertension -Blood pressure stable.  Antihypertensives on hold.  Acute blood loss anemia -Probably secondary to femoral fracture and operative blood loss -Monitor hemoglobin.  Transfuse if hemoglobin is less than 7  History of reactive airway disease -Stable  Hypothyroidism -Continue Synthroid  GERD Continue PPI  Obesity -Outpatient follow-up     DVT prophylaxis: Lovenox  Code Status: Full Family Communication: None at bedside Disposition Plan: Home probably tomorrow if tolerates PT today  Consultants: Orthopedics  Procedures: Right total hip arthroplasty revision on 11/11/2018  Antimicrobials: None   Subjective: Patient seen and examined at bedside.  He feels slightly better and is having bowel movements.  Still has intermittent right hip pain and is hoping to work with physical therapy again today.  No overnight fever or vomiting.  Objective: Vitals:   11/12/18 1728  11/12/18 2107 11/13/18 0515 11/13/18 1014  BP: (!) 142/87 133/76 125/81 120/77  Pulse: 97 94 82 83  Resp: 15 16 16 15   Temp: 98.3 F (36.8 C) (!) 97.4 F (36.3 C) 98.3 F (36.8 C) 97.9 F (36.6 C)  TempSrc:      SpO2: 100% 100% 99% (!) 89%  Weight:      Height:        Intake/Output Summary (Last 24 hours) at 11/13/2018 1314 Last data filed at 11/13/2018 1142 Gross per 24 hour  Intake 1017.59 ml  Output 1470 ml  Net -452.41 ml   Filed Weights   11/05/18 2243 11/06/18 1059 11/11/18 1430  Weight: 131.1 kg 131.1 kg 131 kg    Examination:  General exam: Appears calm and comfortable  Respiratory system: Bilateral decreased breath sounds at bases Cardiovascular system: S1 & S2 heard, Rate controlled Gastrointestinal system: Abdomen is nondistended, soft and nontender. Normal bowel sounds heard. Extremities: No cyanosis, clubbing, edema    Data Reviewed: I have personally reviewed following labs and imaging studies  CBC: Recent Labs  Lab 11/07/18 0501 11/10/18 0355 11/12/18 0339 11/13/18 0328  WBC 6.6 7.2 11.5* 12.1*  HGB 13.4 13.4 11.4* 9.5*  HCT 40.4 40.2 33.6* 28.8*  MCV 89.4 90.3 88.4 88.9  PLT 199 181 239 601   Basic Metabolic Panel: Recent Labs  Lab 11/07/18 0501 11/10/18 0355 11/12/18 0339 11/13/18 0328  NA 141 136 136 136  K 4.5 4.0 4.5 4.5  CL 105 100 103 103  CO2 29 30 24 27   GLUCOSE 102* 127* 177* 151*  BUN 14 15 16 16   CREATININE 0.82 0.79 0.76 0.66  CALCIUM 8.4* 8.3* 8.2* 7.8*   GFR: Estimated Creatinine Clearance:  144.4 mL/min (by C-G formula based on SCr of 0.66 mg/dL). Liver Function Tests: No results for input(s): AST, ALT, ALKPHOS, BILITOT, PROT, ALBUMIN in the last 168 hours. No results for input(s): LIPASE, AMYLASE in the last 168 hours. No results for input(s): AMMONIA in the last 168 hours. Coagulation Profile: No results for input(s): INR, PROTIME in the last 168 hours. Cardiac Enzymes: No results for input(s): CKTOTAL, CKMB,  CKMBINDEX, TROPONINI in the last 168 hours. BNP (last 3 results) No results for input(s): PROBNP in the last 8760 hours. HbA1C: No results for input(s): HGBA1C in the last 72 hours. CBG: No results for input(s): GLUCAP in the last 168 hours. Lipid Profile: No results for input(s): CHOL, HDL, LDLCALC, TRIG, CHOLHDL, LDLDIRECT in the last 72 hours. Thyroid Function Tests: Recent Labs    11/12/18 0339  TSH 1.321  FREET4 1.02   Anemia Panel: No results for input(s): VITAMINB12, FOLATE, FERRITIN, TIBC, IRON, RETICCTPCT in the last 72 hours. Sepsis Labs: No results for input(s): PROCALCITON, LATICACIDVEN in the last 168 hours.  Recent Results (from the past 240 hour(s))  Surgical pcr screen     Status: Abnormal   Collection Time: 11/11/18  6:34 AM  Result Value Ref Range Status   MRSA, PCR NEGATIVE NEGATIVE Final   Staphylococcus aureus POSITIVE (A) NEGATIVE Final    Comment: (NOTE) The Xpert SA Assay (FDA approved for NASAL specimens in patients 68 years of age and older), is one component of a comprehensive surveillance program. It is not intended to diagnose infection nor to guide or monitor treatment. Performed at [facility], [place] [address] [place], [place] [address]          Radiology Studies: Dg Pelvis Portable  Result Date: 11/11/2018 CLINICAL DATA:  57 year old male with right hip revision. EXAM: PORTABLE PELVIS 1-2 VIEWS COMPARISON:  Earlier radiograph dated 11/11/2018 FINDINGS: There are bilateral total hip arthroplasties. The arthroplasty components appear intact and in anatomic alignment. The previously seen bone fragments medial to the proximal right femur are no longer visualized. There is no acute fracture or dislocation. Postsurgical changes the soft tissues of the right hip. IMPRESSION: Bilateral total hip arthroplasties. No acute fracture or dislocation. Electronically Signed   By: Anner Crete M.D.   On: 11/11/2018 21:24   Dg Hip  Port Unilat With Pelvis 1v Right  Result Date: 11/11/2018 CLINICAL DATA:  Intraop right hip revision EXAM: DG HIP (WITH OR WITHOUT PELVIS) 1V PORT RIGHT COMPARISON:  Hip radiograph 11/05/2018 FINDINGS: Single portable AP radiograph demonstrates replacement of the right hip hardware. No definite displaced fracture on current exam. Suspected drain projecting over the right superior thigh soft tissues. IMPRESSION: Patient status post right hip arthroplasty. Electronically Signed   By: Lovey Newcomer M.D.   On: 11/11/2018 19:50   Dg Hip Operative Unilat W Or W/o Pelvis Right  Result Date: 11/11/2018 CLINICAL DATA:  Right hip revision EXAM: OPERATIVE RIGHT HIP (WITH PELVIS IF PERFORMED) 1 VIEWS TECHNIQUE: Fluoroscopic spot image(s) were submitted for interpretation post-operatively. COMPARISON:  11/05/2018 FINDINGS: Revision of the femoral component of the right hip replacement. Bone fragments are noted medial to the proximal femoral shaft. Exact source not visible on this single intraoperative image. IMPRESSION: Revision of the femoral component of the right hip replacement. Small bone fragments are noted medial to the proximal femoral shaft. Electronically Signed   By: Rolm Baptise M.D.   On: 11/11/2018 20:20        Scheduled Meds: . aspirin EC  325 mg Oral BID  . Chlorhexidine Gluconate Cloth  6 each Topical Daily  . dexamethasone  10 mg Intravenous Once  . diclofenac sodium  2 g Topical QID  . docusate sodium  100 mg Oral BID  . ferrous sulfate  325 mg Oral TID PC  . fluticasone  1 spray Each Nare Daily  . levothyroxine  250 mcg Oral QAC breakfast  . mupirocin ointment  1 application Nasal BID  . pantoprazole  40 mg Oral Daily  . polyethylene glycol  17 g Oral BID  . pravastatin  40 mg Oral q1800   Continuous Infusions: . sodium chloride Stopped (11/12/18 1553)  . methocarbamol (ROBAXIN) IV       LOS: 8 days        Aline August, MD [facility] Pager [phone]  If  7PM-7AM, please contact night-coverage [email or web address] Password [email or web address] 11/13/2018, 1:14 PM

## 2018-11-13 NOTE — Progress Notes (Signed)
Physical Therapy Treatment Patient Details Name: Samuel Goodwin MRN: [id number] DOB: 1961/02/24 Today's Date: 11/13/2018    History of Present Illness 57 yo male admitted 11/26 for fracture of R femoral prosthesis at femoral neck. Pt s/p R THA revision on 11/11/18, PWB 50% with posterior precautions. PMH includes alcohol abuse, anxiety, OA, vertigo, depression, cancer, hep C, HLD, HTN, OSA, PTSD, L THA 2018, R THA 2017 with attempted reduction after dislocation and subsequent revision, HLD, L TKR 2015.    PT Comments    Pt with increased ambulation distance during second PT session today, but still requires up to mod assist +2 for mobility tasks. Pt with continued moderate to severe R hip pain with mobility, which is further limiting pt. Pt and pt's wife very anxious this session due to possibility of D/cing home tomorrow. PT gave pt's wife physical assist instructions for pt for bed mobility, transfers, and ambulation, but this did little to alleviate their anxiety. Pt and pt's wife feel that pt is unsafe to d/c home tomorrow, and PT tends to agree due to physical assist required and cuing needed to maintain precautions. Pt adamant to not d/c to ST-rehab. PT to continue to follow acutely.     Supervision for mobility/OOB;Home health PT     Equipment Recommendations  None recommended by PT    Recommendations for Other Services       Precautions / Restrictions Precautions Precautions: Fall;Posterior Hip Precaution Booklet Issued: Yes (comment) Precaution Comments: PT reinforced precautions throughout session  Restrictions Weight Bearing Restrictions: Yes RLE Weight Bearing: Partial weight bearing RLE Partial Weight Bearing Percentage or Pounds: 50%    Mobility  Bed Mobility Overal bed mobility: Needs Assistance Bed Mobility: Supine to Sit;Sit to Supine     Supine to sit: Mod assist Sit to supine: Mod assist;HOB elevated;+2 for physical assistance   General bed mobility  comments: Mod assist for supine<>sit for RLE management, reinforcing hip precautions, and supporting weight of RLE when scooting to and from EOB. Pt with improved scooting this session. +2 assist for sit to supine for trunk management.   Transfers Overall transfer level: Needs assistance Equipment used: Rolling walker (2 wheeled) Transfers: Sit to/from Stand Sit to Stand: Mod assist;+2 safety/equipment;Min assist         General transfer comment: Mod assist for power up, steadying upon standing. Pt with increased assist (mod) required for standing from recliner due to its lower position to ground. Verbal reinforcements for hip precautions  Ambulation/Gait Ambulation/Gait assistance: Min guard Gait Distance (Feet): 45 Feet Assistive device: Rolling walker (2 wheeled) Gait Pattern/deviations: Step-to pattern;Decreased stride length;Antalgic;Decreased weight shift to right;Decreased stance time - right Gait velocity: decr    General Gait Details: Min guard for safety this session, still requires verbal reinforcement for sequencing, foot positioning to avoid IR and adduction, and placement in RW. Pt with increased pain with fatigue. Pt had to sit in recliner after 45 ft due to pain.    Stairs             Wheelchair Mobility    Modified Rankin (Stroke Patients Only)       Balance Overall balance assessment: Needs assistance Sitting-balance support: No upper extremity supported Sitting balance-Leahy Scale: Good     Standing balance support: Bilateral upper extremity supported Standing balance-Leahy Scale: Poor Standing balance comment: relies on RW and PT steadying for balance  Cognition Arousal/Alertness: Awake/alert Behavior During Therapy: WFL for tasks assessed/performed;Anxious Overall Cognitive Status: Within Functional Limits for tasks assessed                                 General Comments: Both pt and pt's  wife very anxious about mobility, about going to home       Exercises Total Joint Exercises Heel Slides: AAROM;Right;10 reps;Supine    General Comments        Pertinent Vitals/Pain Pain Assessment: Faces Faces Pain Scale: Hurts whole lot Pain Location: R hip, with ambulation and fatigue down R quadriceps Pain Descriptors / Indicators: Sore;Shooting Pain Intervention(s): Limited activity within patient's tolerance;Monitored during session;Repositioned;Premedicated before session;Ice applied    Home Living                      Prior Function            PT Goals (current goals can now be found in the care plan section) Acute Rehab PT Goals Patient Stated Goal: decrease pain  PT Goal Formulation: With patient Time For Goal Achievement: 11/20/18 Potential to Achieve Goals: Good Progress towards PT goals: Progressing toward goals    Frequency    7X/week      PT Plan Current plan remains appropriate    Co-evaluation              AM-PAC PT "6 Clicks" Mobility   Outcome Measure  Help needed turning from your back to your side while in a flat bed without using bedrails?: A Little Help needed moving from lying on your back to sitting on the side of a flat bed without using bedrails?: A Little Help needed moving to and from a bed to a chair (including a wheelchair)?: A Lot Help needed standing up from a chair using your arms (e.g., wheelchair or bedside chair)?: A Lot Help needed to walk in hospital room?: A Little Help needed climbing 3-5 steps with a railing? : A Lot 6 Click Score: 15    End of Session Equipment Utilized During Treatment: Gait belt Activity Tolerance: Patient limited by pain Patient left: with call bell/phone within reach;with family/visitor present;with SCD's reapplied;in bed(Pt verbally agrees to press call button and wait for NT or RN to assist back to bed) Nurse Communication: Mobility status;Other (comment)( Pt and pt's wife's  anxiety about d/c home) PT Visit Diagnosis: Other abnormalities of gait and mobility (R26.89);Pain Pain - Right/Left: Right Pain - part of body: Hip     Time: 8413-2440 PT Time Calculation (min) (ACUTE ONLY): 39 min  Charges:  $Gait Training: 8-22 mins $Therapeutic Activity: 8-22 mins                    [name], PT Acute Rehabilitation Services Pager [phone]  Office [phone]    Larri Yehle D Elonda Husky 11/13/2018, 5:48 PM

## 2018-11-13 NOTE — Progress Notes (Signed)
     Subjective: 2 Days Post-Op Procedure(s) (LRB): right total hip arthroplasty revision, femoral stem (Right)   Seen by Dr. Alvan Dame. Patient reports pain as moderate, more pain with movement of the leg. Progressing slowly with PT. Plan for discharge possibly tomorrow due to pain control and need for inpatient therapy to meet goal of being discharged home safely with family/caregiver.    Objective:   VITALS:   Vitals:   11/12/18 2107 11/13/18 0515  BP: 133/76 125/81  Pulse: 94 82  Resp: 16 16  Temp: (!) 97.4 F (36.3 C) 98.3 F (36.8 C)  SpO2: 100% 99%    Dorsiflexion/Plantar flexion intact Incision: dressing C/D/I No cellulitis present Compartment soft  LABS Recent Labs    11/12/18 0339 11/13/18 0328  HGB 11.4* 9.5*  HCT 33.6* 28.8*  WBC 11.5* 12.1*  PLT 239 192    Recent Labs    11/12/18 0339 11/13/18 0328  NA 136 136  K 4.5 4.5  BUN 16 16  CREATININE 0.76 0.66  GLUCOSE 177* 151*     Assessment/Plan: 2 Days Post-Op Procedure(s) (LRB): right total hip arthroplasty revision, femoral stem (Right)  Up with therapy Discharge home probably tomorrow, depending on progress  Obese (BMI 30-39.9) Estimated body mass index is 39.17 kg/m as calculated from the following:   Height as of this encounter: 6' (1.829 m).   Weight as of this encounter: 131 kg. Patient also counseled that weight may inhibit the healing process Patient counseled that losing weight will help with future health issues       West Pugh. Ardelia Wrede   PAC  11/13/2018, 7:54 AM

## 2018-11-13 NOTE — Progress Notes (Signed)
Physical Therapy Treatment Patient Details Name: Samuel Goodwin MRN: [id number] DOB: Oct 23, 1961 Today's Date: 11/13/2018    History of Present Illness 57 yo male admitted 11/26 for fracture of R femoral prosthesis at femoral neck. Pt s/p R THA revision on 11/11/18, PWB 50% with posterior precautions. PMH includes alcohol abuse, anxiety, OA, vertigo, depression, cancer, hep C, HLD, HTN, OSA, PTSD, L THA 2018, R THA 2017 with attempted reduction after dislocation and subsequent revision, HLD, L TKR 2015.    PT Comments    Pt with improved ambulation distance today, still requiring max verbal cuing for posterior hip precautions during mobility and for sequencing to follow WB status of RLE. Pt with improved recall of posterior hip precautions this session. Pt needs continued reinforcement of precautions with sit<>stand, as pt has tendency to want to forward flex at trunk. PT to continue to follow and reinforce precautions with mobility. Pt is concerned about wife's comfort with assisting him at home, and would like for her to be present at PT session this afternoon. PT to continue to follow.    Follow Up Recommendations  Supervision for mobility/OOB;Home health PT     Equipment Recommendations  None recommended by PT    Recommendations for Other Services       Precautions / Restrictions Precautions Precautions: Fall;Posterior Hip Precaution Booklet Issued: Yes (comment) Precaution Comments: Pt recalls 2/3 posterior hip precautions, PT reinforced with all mobility tasks throughout session  Restrictions Weight Bearing Restrictions: Yes RLE Weight Bearing: Partial weight bearing RLE Partial Weight Bearing Percentage or Pounds: 50%    Mobility  Bed Mobility Overal bed mobility: Needs Assistance Bed Mobility: Supine to Sit     Supine to sit: HOB elevated;Min assist     General bed mobility comments: Min assist for RLE management, scooting to EOB with use of bed pad. PT reinforced hip  angle remaining <90* flexion when moving to EOB and once sitting EOB.  Transfers Overall transfer level: Needs assistance Equipment used: Rolling walker (2 wheeled) Transfers: Sit to/from Stand Sit to Stand: Mod assist;From elevated surface         General transfer comment: Mod assist for power up, steadying, and encouraging hip extension. Pt with pain upon initial standing, required standing rest break before ambulation. Again, reinforced placing RLE extended in front of pt to discourage >50% WB when moving from sitting to standing.  Ambulation/Gait Ambulation/Gait assistance: Min assist;+2 safety/equipment(chair follow) Gait Distance (Feet): 35 Feet Assistive device: Rolling walker (2 wheeled) Gait Pattern/deviations: Step-to pattern;Decreased stride length;Decreased weight shift to right;Decreased stance time - right;Antalgic;Trunk flexed Gait velocity: decr    General Gait Details: Pt with less pain with ambulation this session vs yesterday, min assist provided for steadying and pt requiring max verbal cuing for sequencing, placement in RW. Pt encouraged to turn R toes outward when turning towards the R to avoid IR of hip.   Stairs             Wheelchair Mobility    Modified Rankin (Stroke Patients Only)       Balance Overall balance assessment: Needs assistance Sitting-balance support: No upper extremity supported Sitting balance-Leahy Scale: Good     Standing balance support: Bilateral upper extremity supported Standing balance-Leahy Scale: Poor Standing balance comment: relies on RW and PT steadying for balance                             Cognition Arousal/Alertness: Awake/alert Behavior During  Therapy: WFL for tasks assessed/performed Overall Cognitive Status: Within Functional Limits for tasks assessed                                        Exercises Total Joint Exercises Heel Slides: AAROM;Right;10 reps;Supine     General Comments        Pertinent Vitals/Pain Pain Assessment: Faces Faces Pain Scale: Hurts even more Pain Location: R hip and RLE, with moving EOB Pain Descriptors / Indicators: Sore;Tender Pain Intervention(s): Limited activity within patient's tolerance;Repositioned;Monitored during session;Premedicated before session    Home Living                      Prior Function            PT Goals (current goals can now be found in the care plan section) Acute Rehab PT Goals Patient Stated Goal: decrease pain  PT Goal Formulation: With patient Time For Goal Achievement: 11/20/18 Potential to Achieve Goals: Good Progress towards PT goals: Progressing toward goals    Frequency    7X/week      PT Plan Current plan remains appropriate    Co-evaluation              AM-PAC PT "6 Clicks" Mobility   Outcome Measure  Help needed turning from your back to your side while in a flat bed without using bedrails?: A Little Help needed moving from lying on your back to sitting on the side of a flat bed without using bedrails?: A Little Help needed moving to and from a bed to a chair (including a wheelchair)?: A Little Help needed standing up from a chair using your arms (e.g., wheelchair or bedside chair)?: A Little Help needed to walk in hospital room?: A Little Help needed climbing 3-5 steps with a railing? : A Lot 6 Click Score: 17    End of Session Equipment Utilized During Treatment: Gait belt Activity Tolerance: Patient limited by pain Patient left: in chair;with call bell/phone within reach;with family/visitor present;with SCD's reapplied(Pt verbally agrees to press call button and wait for NT or RN to assist back to bed) Nurse Communication: Mobility status( ) PT Visit Diagnosis: Other abnormalities of gait and mobility (R26.89);Pain Pain - Right/Left: Right Pain - part of body: Hip     Time: 1114-1140 PT Time Calculation (min) (ACUTE ONLY): 26  min  Charges:  $Gait Training: 8-22 mins $Therapeutic Activity: 8-22 mins                     Samuel Goodwin, PT Acute Rehabilitation Services Pager [phone]  Office [phone]    Samuel Goodwin 11/13/2018, 1:11 PM

## 2018-11-14 DIAGNOSIS — J45909 Unspecified asthma, uncomplicated: Secondary | ICD-10-CM

## 2018-11-14 LAB — BASIC METABOLIC PANEL
ANION GAP: 6 (ref 5–15)
BUN: 15 mg/dL (ref 6–20)
CO2: 32 mmol/L (ref 22–32)
Calcium: 7.8 mg/dL — ABNORMAL LOW (ref 8.9–10.3)
Chloride: 101 mmol/L (ref 98–111)
Creatinine, Ser: 0.63 mg/dL (ref 0.61–1.24)
GFR calc Af Amer: >60 mL/min (ref >60)
GFR calc non Af Amer: >60 mL/min (ref >60)
Glucose, Bld: 111 mg/dL — ABNORMAL HIGH (ref 70–99)
Potassium: 3.6 mmol/L (ref 3.5–5.1)
SODIUM: 139 mmol/L (ref 135–145)

## 2018-11-14 LAB — CBC WITH DIFFERENTIAL/PLATELET
Abs Immature Granulocytes: 0.09 10*3/uL — ABNORMAL HIGH (ref 0.00–0.07)
BASOS ABS: 0 10*3/uL (ref 0.0–0.1)
Basophils Relative: 0 %
Eosinophils Absolute: 0 10*3/uL (ref 0.0–0.5)
Eosinophils Relative: 0 %
HCT: 24.4 % — ABNORMAL LOW (ref 39.0–52.0)
Hemoglobin: 8 g/dL — ABNORMAL LOW (ref 13.0–17.0)
IMMATURE GRANULOCYTES: 1 %
Lymphocytes Relative: 16 %
Lymphs Abs: 1.6 10*3/uL (ref 0.7–4.0)
MCH: 29.6 pg (ref 26.0–34.0)
MCHC: 32.8 g/dL (ref 30.0–36.0)
MCV: 90.4 fL (ref 80.0–100.0)
Monocytes Absolute: 0.9 10*3/uL (ref 0.1–1.0)
Monocytes Relative: 9 %
Neutro Abs: 7.2 10*3/uL (ref 1.7–7.7)
Neutrophils Relative %: 74 %
Platelets: 186 10*3/uL (ref 150–400)
RBC: 2.7 MIL/uL — ABNORMAL LOW (ref 4.22–5.81)
RDW: 13 % (ref 11.5–15.5)
WBC: 9.9 10*3/uL (ref 4.0–10.5)
nRBC: 0 % (ref 0.0–0.2)

## 2018-11-14 LAB — MAGNESIUM: MAGNESIUM: 2.2 mg/dL (ref 1.7–2.4)

## 2018-11-14 NOTE — Progress Notes (Signed)
Physical Therapy Treatment Patient Details Name: Samuel Goodwin MRN: [id number] DOB: 09-May-1961 Today's Date: 11/14/2018    History of Present Illness 57 yo male admitted 11/26 for fracture of R femoral prosthesis at femoral neck. Pt s/p R THA revision on 11/11/18, PWB 50% with posterior precautions. PMH includes alcohol abuse, anxiety, OA, vertigo, depression, cancer, hep C, HLD, HTN, OSA, PTSD, L THA 2018, R THA 2017 with attempted reduction after dislocation and subsequent revision, HLD, L TKR 2015.    PT Comments    Pt with continued severe RLE pain with ambulation with RLE in dependent position. Ambulation distance (12 ft) limited by pain. Pt is maintaining <=50%WB and posterior precautions during session. Pt and wife with continued anxiety about d/c home due to pt's lack of pain management and physical assist for mobility. PT aware that both pt and MD do not want pt to d/c to rehab at this time. Will continue to follow acutely.    Follow Up Recommendations  Supervision for mobility/OOB;Home health PT     Equipment Recommendations  None recommended by PT    Recommendations for Other Services       Precautions / Restrictions Precautions Precautions: Fall;Posterior Hip Precaution Booklet Issued: Yes (comment) Precaution Comments: Pt with improved recall of precautions, applying precautions to mobility. Min reinforcement during session.  Restrictions Weight Bearing Restrictions: Yes RLE Weight Bearing: Partial weight bearing RLE Partial Weight Bearing Percentage or Pounds: 50%    Mobility  Bed Mobility Overal bed mobility: Needs Assistance Bed Mobility: Supine to Sit;Sit to Supine     Supine to sit: Min assist;HOB elevated Sit to supine: Mod assist   General bed mobility comments: Min assist for supine to sit for RLE management. Pt with improved knowledge of precautions exhibited during bed mobility. Mod assist for sit to supine for bilateral LE lifting.    Transfers Overall transfer level: Needs assistance Equipment used: Rolling walker (2 wheeled) Transfers: Sit to/from Stand Sit to Stand: Min assist;From elevated surface         General transfer comment: Min assist for power up and steadying, pt with severe pain again with standing, but pt reports no WB on RLE at all. PT suspects dependent position of RLE is painful.   Ambulation/Gait Ambulation/Gait assistance: Min guard Gait Distance (Feet): 12 Feet Assistive device: Rolling walker (2 wheeled) Gait Pattern/deviations: Step-to pattern;Decreased stance time - left;Decreased stance time - right;Decreased weight shift to right;Antalgic Gait velocity: decr    General Gait Details: Min guard for safety. Min verbal cuing provided for posterior precautions/foot and hip orientation, maintaining <=50% WB on RLE. Pt very limited by severe pain with ambulation in second session as well as first.    Chief Strategy Officer    Modified Rankin (Stroke Patients Only)       Balance Overall balance assessment: Needs assistance Sitting-balance support: No upper extremity supported Sitting balance-Leahy Scale: Good     Standing balance support: Bilateral upper extremity supported Standing balance-Leahy Scale: Poor Standing balance comment: relies on RW and PT steadying for balance                             Cognition Arousal/Alertness: Awake/alert Behavior During Therapy: Anxious Overall Cognitive Status: Within Functional Limits for tasks assessed  Exercises      General Comments        Pertinent Vitals/Pain Pain Assessment: 0-10 Pain Score: 8  Pain Location: R quadriceps and femur, with ambulation Pain Descriptors / Indicators: Grimacing;Moaning;Sore Pain Intervention(s): Limited activity within patient's tolerance;Repositioned;Ice applied;Monitored during session;Premedicated before  session    Home Living                      Prior Function            PT Goals (current goals can now be found in the care plan section) Acute Rehab PT Goals Patient Stated Goal: decrease pain  PT Goal Formulation: With patient Time For Goal Achievement: 11/20/18 Potential to Achieve Goals: Good Progress towards PT goals: Progressing toward goals    Frequency    7X/week      PT Plan Current plan remains appropriate    Co-evaluation              AM-PAC PT "6 Clicks" Mobility   Outcome Measure  Help needed turning from your back to your side while in a flat bed without using bedrails?: A Little Help needed moving from lying on your back to sitting on the side of a flat bed without using bedrails?: A Little Help needed moving to and from a bed to a chair (including a wheelchair)?: A Little Help needed standing up from a chair using your arms (e.g., wheelchair or bedside chair)?: A Little Help needed to walk in hospital room?: A Little Help needed climbing 3-5 steps with a railing? : A Lot 6 Click Score: 17    End of Session Equipment Utilized During Treatment: Gait belt Activity Tolerance: Patient limited by pain Patient left: in bed;with bed alarm set;with call bell/phone within reach;with family/visitor present;with SCD's reapplied Nurse Communication: Mobility status;Other (comment)(severe pain ) PT Visit Diagnosis: Other abnormalities of gait and mobility (R26.89);Pain Pain - Right/Left: Right Pain - part of body: Hip;Leg     Time: 1350-1415 PT Time Calculation (min) (ACUTE ONLY): 25 min  Charges:  $Gait Training: 8-22 mins $Therapeutic Activity: 8-22 mins                     [name], PT Acute Rehabilitation Services Pager [phone]  Office [phone]    Jacora Hopkins D Nancy Arvin 11/14/2018, 6:02 PM

## 2018-11-14 NOTE — Progress Notes (Signed)
Patient ID: Samuel Goodwin, male   DOB: Jul 29, 1961, 57 y.o.   MRN: [id number] Subjective: 3 Days Post-Op Procedure(s) (LRB): right total hip arthroplasty revision, femoral stem (Right)    Patient reports pain as moderate this am as he just was up bathing.  Had a great am yesterday but pm was very rough.  Uncertain about home today due to weakness and pain with function  Objective:   VITALS:   Vitals:   11/13/18 2130 11/14/18 0502  BP: 137/70 111/73  Pulse: 84 72  Resp: 16 16  Temp: 97.8 F (36.6 C) 98.1 F (36.7 C)  SpO2: 95% 99%    Neurovascular intact Incision: dressing C/D/I left hip  LABS Recent Labs    11/12/18 0339 11/13/18 0328 11/14/18 0526  HGB 11.4* 9.5* 8.0*  HCT 33.6* 28.8* 24.4*  WBC 11.5* 12.1* 9.9  PLT 239 192 186    Recent Labs    11/12/18 0339 11/13/18 0328 11/14/18 0526  NA 136 136 139  K 4.5 4.5 3.6  BUN 16 16 15   CREATININE 0.76 0.66 0.63  GLUCOSE 177* 151* 111*    No results for input(s): LABPT, INR in the last 72 hours.   Assessment/Plan: 3 Days Post-Op Procedure(s) (LRB): right total hip arthroplasty revision, femoral stem (Right)   Up with therapy  Needs more therapy prior to considering home discharge - maybe tomorrow versus Saturday Rx given for lift chair PWB LLE Reviewed expectations

## 2018-11-14 NOTE — Progress Notes (Signed)
Physical Therapy Treatment Patient Details Name: Samuel Goodwin MRN: [id number] DOB: 05-07-1961 Today's Date: 11/14/2018    History of Present Illness 57 yo male admitted 11/26 for fracture of R femoral prosthesis at femoral neck. Pt s/p R THA revision on 11/11/18, PWB 50% with posterior precautions. PMH includes alcohol abuse, anxiety, OA, vertigo, depression, cancer, hep C, HLD, HTN, OSA, PTSD, L THA 2018, R THA 2017 with attempted reduction after dislocation and subsequent revision, HLD, L TKR 2015.    PT Comments    Pt limited this session by severe pain in R hip and thigh. Pt only able to ambulate 8 ft due to pain. Pt with tightness of quadriceps, so pt instructed in quad stretches in sitting and supine that are posterior hip-compliant. PT with continued pt education of hip precautions, mobility strategies for return home, and caregiver training for wife to assist pt as needed. Pt ordered lift chair for home, and this should be delivered tomorrow. PT to see pt for second session this pm for gait training and potential step training.    Follow Up Recommendations  Supervision for mobility/OOB;Home health PT     Equipment Recommendations  None recommended by PT    Recommendations for Other Services       Precautions / Restrictions Precautions Precautions: Fall;Posterior Hip Precaution Booklet Issued: Yes (comment) Precaution Comments: PT reinforced precautions throughout session  Restrictions Weight Bearing Restrictions: Yes RLE Weight Bearing: Partial weight bearing RLE Partial Weight Bearing Percentage or Pounds: 50%    Mobility  Bed Mobility Overal bed mobility: Needs Assistance Bed Mobility: Supine to Sit;Sit to Supine     Supine to sit: Min assist;HOB elevated Sit to supine: Mod assist   General bed mobility comments: Min assist for supine to sit for RLE translation, maintaining precautions, scoot assist with bed pad. Mod assist for return to bed for bilateral LE  lifting, pt scooted self up in bed with trapeze frame and bridging.   Transfers Overall transfer level: Needs assistance Equipment used: Rolling walker (2 wheeled) Transfers: Sit to/from Stand Sit to Stand: Min assist;From elevated surface         General transfer comment: Min assist for steadying upon standing, pt able to power up without physical assist. Pt with high pain in R quad upon standing, pt required 2 minutes of standing before initiating ambulation due to pain.  Ambulation/Gait Ambulation/Gait assistance: Min assist;Min guard Gait Distance (Feet): 8 Feet Assistive device: Rolling walker (2 wheeled) Gait Pattern/deviations: Step-to pattern;Decreased stride length;Decreased weight shift to right;Decreased stance time - right;Antalgic Gait velocity: decr    General Gait Details: Min guard initially for safety, requiring min assist at times for forward progression of RLE. Pt complaining of R thigh pain 8/10 with hip flexion for forward progression of R lower limb. Pt unable to ambulate further due to extreme pain.    Stairs             Wheelchair Mobility    Modified Rankin (Stroke Patients Only)       Balance Overall balance assessment: Needs assistance Sitting-balance support: No upper extremity supported Sitting balance-Leahy Scale: Good     Standing balance support: Bilateral upper extremity supported Standing balance-Leahy Scale: Poor Standing balance comment: relies on RW and PT steadying for balance                             Cognition Arousal/Alertness: Awake/alert Behavior During Therapy: Anxious Overall Cognitive Status:  Within Functional Limits for tasks assessed                                        Exercises Total Joint Exercises Heel Slides: AAROM;Right;15 reps;Supine Hip ABduction/ADduction: AAROM;15 reps;Supine Knee Flexion: AAROM;Right;20 reps;Seated(with towel under RLE to facilitate sliding ) Bridges:  AROM;Both;Supine(2, during bed mobility )    General Comments        Pertinent Vitals/Pain Pain Assessment: 0-10 Pain Score: 8  Pain Location: R quadriceps and femur, with ambulation Pain Descriptors / Indicators: Sore;Other (Comment);Moaning(stretching) Pain Intervention(s): Limited activity within patient's tolerance;Repositioned;Monitored during session;Premedicated before session    Home Living                      Prior Function            PT Goals (current goals can now be found in the care plan section) Acute Rehab PT Goals Patient Stated Goal: decrease pain  PT Goal Formulation: With patient Time For Goal Achievement: 11/20/18 Potential to Achieve Goals: Good Progress towards PT goals: Progressing toward goals    Frequency    7X/week      PT Plan Current plan remains appropriate    Co-evaluation              AM-PAC PT "6 Clicks" Mobility   Outcome Measure  Help needed turning from your back to your side while in a flat bed without using bedrails?: A Little Help needed moving from lying on your back to sitting on the side of a flat bed without using bedrails?: A Little Help needed moving to and from a bed to a chair (including a wheelchair)?: A Little Help needed standing up from a chair using your arms (e.g., wheelchair or bedside chair)?: A Little Help needed to walk in hospital room?: A Little Help needed climbing 3-5 steps with a railing? : A Lot 6 Click Score: 17    End of Session Equipment Utilized During Treatment: Gait belt Activity Tolerance: Patient limited by pain Patient left: in bed;with bed alarm set;with call bell/phone within reach;with family/visitor present;with SCD's reapplied Nurse Communication: Mobility status;Other (comment)(coordinating pain medications before second session) PT Visit Diagnosis: Other abnormalities of gait and mobility (R26.89);Pain Pain - Right/Left: Right Pain - part of body: Hip;Leg      Time: 1119-1200 PT Time Calculation (min) (ACUTE ONLY): 41 min  Charges:  $Gait Training: 8-22 mins $Therapeutic Exercise: 8-22 mins $Therapeutic Activity: 8-22 mins                     [name], PT Acute Rehabilitation Services Pager [phone]  Office [phone]   Kalyn Hofstra D Autry Droege 11/14/2018, 12:20 PM

## 2018-11-14 NOTE — Progress Notes (Signed)
Patient ID: Samuel Goodwin, male   DOB: May 02, 1961, 57 y.o.   MRN: [id number]  PROGRESS NOTE    Samuel Goodwin  BLT:[id number] DOB: 1961/05/28 DOA: 11/05/2018 PCP: [name], MD   Brief Narrative:  57 year old male with history of alcohol abuse, anxiety, depression, GERD, hep C status post interferon in 1994, hypertension, hyperlipidemia, hypothyroidism, OSA presented for evaluation of right hip pain on 11/05/2018.  He was found to have fracture of the right femoral prosthesis and underwent surgery on 11/11/2018 by orthopedics.   Assessment & Plan:   Principal Problem:   Femoral fracture (HCC) Active Problems:   Hypothyroidism   HTN (hypertension)   GERD (gastroesophageal reflux disease)   Chronic anemia   Reactive airway disease   Hip fracture (HCC)   Fracture of right femoral prosthesis -Status post surgery on 11/11/2018 -Orthopedics following. -Patient is still having significant pain today and could not tolerate physical therapy.  Not stable for discharge today.  Continue pain management.  Follow PT recommendations  Constipation -Resolved.  Continue bowel regimen  Essential hypertension -Blood pressure stable.  Antihypertensives on hold.  Acute blood loss anemia -Probably secondary to femoral fracture and operative blood loss -Monitor hemoglobin.  Transfuse if hemoglobin is less than 7  History of reactive airway disease -Stable  Hypothyroidism -Continue Synthroid  GERD Continue PPI  Obesity -Outpatient follow-up     DVT prophylaxis: Lovenox  Code Status: Full Family Communication: Wife at bedside Disposition Plan: Home probably in 1 to 2 days pending improvement with PT Consultants: Orthopedics  Procedures: Right total hip arthroplasty revision on 11/11/2018  Antimicrobials: None   Subjective: Patient seen and examined at bedside.  He complains of worsening lower extremity pain today and could hardly tolerate any physical therapy.  No  overnight fever, nausea or vomiting.  He had bowel movement this morning.  Objective: Vitals:   11/13/18 1355 11/13/18 2130 11/14/18 0502 11/14/18 1303  BP: (!) 147/86 137/70 111/73 125/75  Pulse: 90 84 72 78  Resp: 16 16 16 16   Temp: 98.2 F (36.8 C) 97.8 F (36.6 C) 98.1 F (36.7 C) 98.3 F (36.8 C)  TempSrc:   Oral Oral  SpO2: 99% 95% 99% 100%  Weight:      Height:        Intake/Output Summary (Last 24 hours) at 11/14/2018 1310 Last data filed at 11/14/2018 0730 Gross per 24 hour  Intake 840 ml  Output 1680 ml  Net -840 ml   Filed Weights   11/05/18 2243 11/06/18 1059 11/11/18 1430  Weight: 131.1 kg 131.1 kg 131 kg    Examination:  General exam: Appears calm and comfortable, no distress Respiratory system: Bilateral decreased breath sounds at bases, no wheezing Cardiovascular system: S1 & S2 heard, Rate controlled Gastrointestinal system: Abdomen is nondistended, soft and nontender. Normal bowel sounds heard. Extremities: No cyanosis, edema    Data Reviewed: I have personally reviewed following labs and imaging studies  CBC: Recent Labs  Lab 11/10/18 0355 11/12/18 0339 11/13/18 0328 11/14/18 0526  WBC 7.2 11.5* 12.1* 9.9  NEUTROABS  --   --   --  7.2  HGB 13.4 11.4* 9.5* 8.0*  HCT 40.2 33.6* 28.8* 24.4*  MCV 90.3 88.4 88.9 90.4  PLT 181 239 192 007   Basic Metabolic Panel: Recent Labs  Lab 11/10/18 0355 11/12/18 0339 11/13/18 0328 11/14/18 0526  NA 136 136 136 139  K 4.0 4.5 4.5 3.6  CL 100 103 103 101  CO2 30 24 27  32  GLUCOSE 127* 177* 151* 111*  BUN 15 16 16 15   CREATININE 0.79 0.76 0.66 0.63  CALCIUM 8.3* 8.2* 7.8* 7.8*  MG  --   --   --  2.2   GFR: Estimated Creatinine Clearance: 144.4 mL/min (by C-G formula based on SCr of 0.63 mg/dL). Liver Function Tests: No results for input(s): AST, ALT, ALKPHOS, BILITOT, PROT, ALBUMIN in the last 168 hours. No results for input(s): LIPASE, AMYLASE in the last 168 hours. No results for input(s):  AMMONIA in the last 168 hours. Coagulation Profile: No results for input(s): INR, PROTIME in the last 168 hours. Cardiac Enzymes: No results for input(s): CKTOTAL, CKMB, CKMBINDEX, TROPONINI in the last 168 hours. BNP (last 3 results) No results for input(s): PROBNP in the last 8760 hours. HbA1C: No results for input(s): HGBA1C in the last 72 hours. CBG: No results for input(s): GLUCAP in the last 168 hours. Lipid Profile: No results for input(s): CHOL, HDL, LDLCALC, TRIG, CHOLHDL, LDLDIRECT in the last 72 hours. Thyroid Function Tests: Recent Labs    11/12/18 0339  TSH 1.321  FREET4 1.02   Anemia Panel: No results for input(s): VITAMINB12, FOLATE, FERRITIN, TIBC, IRON, RETICCTPCT in the last 72 hours. Sepsis Labs: No results for input(s): PROCALCITON, LATICACIDVEN in the last 168 hours.  Recent Results (from the past 240 hour(s))  Surgical pcr screen     Status: Abnormal   Collection Time: 11/11/18  6:34 AM  Result Value Ref Range Status   MRSA, PCR NEGATIVE NEGATIVE Final   Staphylococcus aureus POSITIVE (A) NEGATIVE Final    Comment: (NOTE) The Xpert SA Assay (FDA approved for NASAL specimens in patients 16 years of age and older), is one component of a comprehensive surveillance program. It is not intended to diagnose infection nor to guide or monitor treatment. Performed at [facility], [place] [address] [place], [place] [address]          Radiology Studies: No results found.      Scheduled Meds: . aspirin EC  325 mg Oral BID  . Chlorhexidine Gluconate Cloth  6 each Topical Daily  . dexamethasone  10 mg Intravenous Once  . diclofenac sodium  2 g Topical QID  . docusate sodium  100 mg Oral BID  . ferrous sulfate  325 mg Oral TID PC  . fluticasone  1 spray Each Nare Daily  . levothyroxine  250 mcg Oral QAC breakfast  . mupirocin ointment  1 application Nasal BID  . pantoprazole  40 mg Oral Daily  . polyethylene glycol  17 g Oral BID    . pravastatin  40 mg Oral q1800   Continuous Infusions: . sodium chloride Stopped (11/12/18 1553)  . methocarbamol (ROBAXIN) IV       LOS: 8 days        Aline August, MD [facility] Pager [phone]  If 7PM-7AM, please contact night-coverage [email or web address] Password [email or web address] 11/14/2018, 1:10 PM

## 2018-11-15 NOTE — NC FL2 (Signed)
[place] LEVEL OF CARE SCREENING TOOL     IDENTIFICATION  Patient Name: Samuel Goodwin Birthdate: 17-Apr-1961 Sex: male Admission Date (Current Location): 11/05/2018  [facility] and [place] Number:  Herbalist and Address:  [facility],  [place] [address], [place]      Provider Number: [id number]  Attending Physician Name and Address:  Aline August, MD  Relative Name and Phone Number:       Current Level of Care: Hospital Recommended Level of Care: [place] Prior Approval Number:    Date Approved/Denied:   PASRR Number:    Discharge Plan: SNF    Current Diagnoses: Patient Active Problem List   Diagnosis Date Noted  . Hip fracture ([place]) 11/13/2018  . Chronic anemia 11/06/2018  . Reactive airway disease 11/06/2018  . Femoral fracture ([place]) 11/05/2018  . Morbid obesity ([place]) 12/20/2016  . S/P left THA, AA 12/19/2016  . Chest pain 08/15/2016  . S/P right TH revision 02/09/2016  . Hip instability 02/05/2016  . H/O total hip arthroplasty 02/05/2016  . S/P right THA, AA 02/01/2016  . S/P knee replacement 11/27/2014  . Unspecified viral hepatitis C without hepatic coma 08/28/2014  . DYSPHONIA, CHRONIC 01/06/2010  . NONSPECIFIC ABNORMAL ELECTROCARDIOGRAM 01/06/2010  . HOARSENESS, CHRONIC 05/31/2009  . Hypothyroidism 04/09/2009  . HYPERLIPIDEMIA 04/09/2009  . HTN (hypertension) 04/09/2009  . GERD (gastroesophageal reflux disease) 04/09/2009  . Sleep apnea 04/09/2009    Orientation RESPIRATION BLADDER Height & Weight     Self, Time, Situation, Place  Normal Continent Weight: 288 lb 12.8 oz (131 kg) Height:  6' (182.9 cm)  BEHAVIORAL SYMPTOMS/MOOD NEUROLOGICAL BOWEL NUTRITION STATUS      Continent Diet(See discharge Summary )  AMBULATORY STATUS COMMUNICATION OF NEEDS Skin   Extensive Assist Verbally Surgical wounds                       Personal Care Assistance Level of Assistance  Bathing,  Feeding, Dressing Bathing Assistance: Maximum assistance Feeding assistance: Independent Dressing Assistance: Maximum assistance     Functional Limitations Info  Sight, Hearing, Speech Sight Info: Adequate Hearing Info: Adequate Speech Info: Adequate    SPECIAL CARE FACTORS FREQUENCY  PT (By licensed PT), OT (By licensed OT)     PT Frequency: 5X/week OT Frequency: 5x/week            Contractures Contractures Info: Not present    Additional Factors Info  Code Status, Allergies Code Status Info: Fullcode Allergies Info: Allergies: Dilaudid Hydromorphone Hcl, Benazepril Hcl           Current Medications (11/15/2018):  This is the current hospital active medication list Current Facility-Administered Medications  Medication Dose Route Frequency Provider Last Rate Last Dose  . 0.9 %  sodium chloride infusion   Intravenous Continuous Bonnielee Haff, MD   Stopped at 11/12/18 1553  . acetaminophen (TYLENOL) tablet 650 mg  650 mg Oral Q6H PRN Danae Orleans, PA-C      . albuterol (PROVENTIL) (2.5 MG/3ML) 0.083% nebulizer solution 3 mL  3 mL Inhalation Q6H PRN Babish, Matthew, PA-C      . alum & mag hydroxide-simeth (MAALOX/MYLANTA) 200-200-20 MG/5ML suspension 15 mL  15 mL Oral Q4H PRN Danae Orleans, PA-C   15 mL at 11/15/18 0015  . aspirin EC tablet 325 mg  325 mg Oral BID Danae Orleans, PA-C   325 mg at 11/15/18 0745  . bisacodyl (DULCOLAX) suppository 10 mg  10  mg Rectal Daily PRN Danae Orleans, PA-C      . Chlorhexidine Gluconate Cloth 2 % PADS 6 each  6 each Topical Daily Paralee Cancel, MD   6 each at 11/15/18 [phone]  . dexamethasone (DECADRON) injection 10 mg  10 mg Intravenous Once Babish, Matthew, PA-C      . diclofenac sodium (VOLTAREN) 1 % transdermal gel 2 g  2 g Topical QID Babish, Matthew, PA-C   2 g at 11/15/18 1308  . diphenhydrAMINE (BENADRYL) 12.5 MG/5ML elixir 12.5-25 mg  12.5-25 mg Oral Q4H PRN Danae Orleans, PA-C      . docusate sodium (COLACE) capsule  100 mg  100 mg Oral BID Danae Orleans, PA-C   100 mg at 11/15/18 0915  . ferrous sulfate tablet 325 mg  325 mg Oral TID PC Danae Orleans, PA-C   325 mg at 11/15/18 1305  . fluticasone (FLONASE) 50 MCG/ACT nasal spray 1 spray  1 spray Each Nare Daily Danae Orleans, PA-C   1 spray at 11/15/18 0917  . HYDROcodone-acetaminophen (NORCO) 7.5-325 MG per tablet 1-2 tablet  1-2 tablet Oral Q4H PRN Danae Orleans, PA-C   2 tablet at 11/15/18 1305  . levothyroxine (SYNTHROID, LEVOTHROID) tablet 250 mcg  250 mcg Oral QAC breakfast Danae Orleans, PA-C   250 mcg at 11/15/18 2409  . magnesium citrate solution 1 Bottle  1 Bottle Oral Once PRN Babish, Matthew, PA-C      . menthol-cetylpyridinium (CEPACOL) lozenge 3 mg  1 lozenge Oral PRN Danae Orleans, PA-C       Or  . phenol (CHLORASEPTIC) mouth spray 1 spray  1 spray Mouth/Throat PRN Danae Orleans, PA-C      . methocarbamol (ROBAXIN) tablet 500 mg  500 mg Oral Q6H PRN Danae Orleans, PA-C   500 mg at 11/15/18 1037   Or  . methocarbamol (ROBAXIN) 500 mg in dextrose 5 % 50 mL IVPB  500 mg Intravenous Q6H PRN Babish, Matthew, PA-C      . metoCLOPramide (REGLAN) tablet 5-10 mg  5-10 mg Oral Q8H PRN Danae Orleans, PA-C       Or  . metoCLOPramide (REGLAN) injection 5-10 mg  5-10 mg Intravenous Q8H PRN Danae Orleans, PA-C      . morphine 2 MG/ML injection 2-4 mg  2-4 mg Intravenous Q2H PRN Danae Orleans, PA-C   2 mg at 11/11/18 2235  . mupirocin ointment (BACTROBAN) 2 % 1 application  1 application Nasal BID Paralee Cancel, MD   1 application at 73/53/29 0915  . ondansetron (ZOFRAN) injection 4 mg  4 mg Intravenous Q6H PRN Danae Orleans, PA-C   4 mg at 11/12/18 1736  . pantoprazole (PROTONIX) EC tablet 40 mg  40 mg Oral Daily Danae Orleans, PA-C   40 mg at 11/14/18 1709  . polyethylene glycol (MIRALAX / GLYCOLAX) packet 17 g  17 g Oral BID Danae Orleans, PA-C   17 g at 11/15/18 0915  . pravastatin (PRAVACHOL) tablet 40 mg  40 mg Oral q1800  Danae Orleans, PA-C   40 mg at 11/14/18 1709     Discharge Medications: Please see discharge summary for a list of discharge medications.  Relevant Imaging Results:  Relevant Lab Results:   Additional Information ssn:[id number]  Lia Hopping, LCSW

## 2018-11-15 NOTE — Clinical Social Work Note (Signed)
Clinical Social Work Assessment  Patient Details  Name: Samuel Goodwin MRN: [id number] Date of Birth: [birth year]  Date of referral:  11/15/18               Reason for consult:  Facility Placement, Discharge Planning                Permission sought to share information with:  Facility Sport and exercise psychologist, Tourist information centre manager, Family Supports Permission granted to share information::  Yes, Verbal Permission Granted  Name::        Agency::  SNF  Relationship::   Spouse and Daughter   Contact Information:     Housing/Transportation Living arrangements for the past 2 months:  [place] of Information:  Patient Patient Interpreter Needed:  None Criminal Activity/Legal Involvement Pertinent to Current Situation/Hospitalization:  No - Comment as needed Significant Relationships:  Warehouse manager, Adult Children Lives with:  Spouse Do you feel safe going back to the place where you live?  Yes Need for family participation in patient care:  Yes  Care giving concerns:   Right Hip Pain.  Per Ortho Physician-May need ST SNF Monday if still unable to navigate safely at home.  CSW assisting with discharge Plan to SNF.   Social Worker assessment / plan:  CSW discussed with the patient and his daughter. Patient reports he is agreeable to SNF if he does not progress well with physical therapy. CSW provided the patient and his daughter with a list of Medicare rated SNF facilities for review. Patient will need level II PASRR and Futures trader. FL2 completed.    Plan: SNF  Employment status:  [employer] information:  Other (Comment Required)([employer]) PT Recommendations:  [place] / Referral to community resources:  [place]  Patient/Family's Response to care:  Agreeable and Responding well to care.   Patient/Family's Understanding of and Emotional Response to Diagnosis, Current Treatment, and Prognosis:  Patient has  a good understanding of her diagnosis and follow up care.   Emotional Assessment Appearance:  Developmentally appropriate Attitude/Demeanor/Rapport:    Affect (typically observed):  Accepting, Pleasant Orientation:  Oriented to Self, Oriented to Place, Oriented to  Time, Oriented to Situation Alcohol / Substance use:  Not Applicable Psych involvement (Current and /or in the community):  No (Comment)  Discharge Needs  Concerns to be addressed:  Discharge Planning Concerns Readmission within the last 30 days:  No Current discharge risk:  Physical Impairment, Dependent with Mobility Barriers to Discharge:  Continued Medical Work up, Ship broker, Programmer, applications ([place])   Lia Hopping, LCSW 11/15/2018, 2:05 PM

## 2018-11-15 NOTE — Progress Notes (Signed)
Patient ID: CAILEB RHUE, male   DOB: 07-04-1961, 57 y.o.   MRN: [id number] Subjective: 4 Days Post-Op Procedure(s) (LRB): right total hip arthroplasty revision, femoral stem (Right)    Patient reports pain as moderate.  Patient reports pain as moderate.  Tough day yesterday with regards to activity. Forever remains focused and optimistic Would prefer to go home but we discussed the possible need for transfer to [facility] if still here Monday   Objective:   VITALS:   Vitals:   11/14/18 2112 11/15/18 0515  BP: 119/71 126/77  Pulse: 78 68  Resp: 16 16  Temp: 98 F (36.7 C) 97.7 F (36.5 C)  SpO2: 100% 100%    Neurovascular intact Incision: dressing C/D/I  LABS Recent Labs    11/13/18 0328 11/14/18 0526  HGB 9.5* 8.0*  HCT 28.8* 24.4*  WBC 12.1* 9.9  PLT 192 186    Recent Labs    11/13/18 0328 11/14/18 0526  NA 136 139  K 4.5 3.6  BUN 16 15  CREATININE 0.66 0.63  GLUCOSE 151* 111*    No results for input(s): LABPT, INR in the last 72 hours.   Assessment/Plan: 4 Days Post-Op Procedure(s) (LRB): right total hip arthroplasty revision, femoral stem (Right)   Up with therapy - continue therapy to assess function and disposition PWB RLE Will put in SW consult to begin process in case safe home discharge not possible They received elevating chair yesterday at home

## 2018-11-15 NOTE — Progress Notes (Signed)
Patient ID: Samuel Goodwin, male   DOB: August 23, 1961, 57 y.o.   MRN: [id number]  PROGRESS NOTE    Samuel Goodwin  LJQ:[id number] DOB: June 20, 1961 DOA: 11/05/2018 PCP: [name], MD   Brief Narrative:  57 year old male with history of alcohol abuse, anxiety, depression, GERD, hep C status post interferon in 1994, hypertension, hyperlipidemia, hypothyroidism, OSA presented for evaluation of right hip pain on 11/05/2018.  He was found to have fracture of the right femoral prosthesis and underwent surgery on 11/11/2018 by orthopedics.   Assessment & Plan:   Principal Problem:   Femoral fracture (HCC) Active Problems:   Hypothyroidism   HTN (hypertension)   GERD (gastroesophageal reflux disease)   Chronic anemia   Reactive airway disease   Hip fracture (HCC)   Fracture of right femoral prosthesis -Status post surgery on 11/11/2018 -Orthopedics following. -Pain is slightly improved today but still having significant pain.  Continue pain management.  Follow PT recommendations.  If continues to have issues with pain, might benefit from short-term rehab.  Constipation -Resolved.  Continue bowel regimen  Essential hypertension -Blood pressure stable.  Antihypertensives on hold.  Acute blood loss anemia -Probably secondary to femoral fracture and operative blood loss -Monitor hemoglobin.  Transfuse if hemoglobin is less than 7  History of reactive airway disease -Stable  Hypothyroidism -Continue Synthroid  GERD Continue PPI  Obesity -Outpatient follow-up     DVT prophylaxis: Lovenox  Code Status: Full Family Communication: Wife at bedside Disposition Plan: Home probably in 1 to 2 days pending improvement with PT Consultants: Orthopedics  Procedures: Right total hip arthroplasty revision on 11/11/2018  Antimicrobials: None   Subjective: Patient seen and examined at bedside.  Patient had a rough day yesterday with worsening pain.  This morning he feels slightly  better, his pain is better controlled with pain medications.  Still having intermittent significant pain.  No overnight fever or vomiting. Objective: Vitals:   11/14/18 1303 11/14/18 1934 11/14/18 2112 11/15/18 0515  BP: 125/75  119/71 126/77  Pulse: 78 71 78 68  Resp: 16 15 16 16   Temp: 98.3 F (36.8 C)  98 F (36.7 C) 97.7 F (36.5 C)  TempSrc: Oral  Oral Oral  SpO2: 100% 98% 100% 100%  Weight:      Height:        Intake/Output Summary (Last 24 hours) at 11/15/2018 0938 Last data filed at 11/15/2018 2197 Gross per 24 hour  Intake 120 ml  Output 950 ml  Net -830 ml   Filed Weights   11/05/18 2243 11/06/18 1059 11/11/18 1430  Weight: 131.1 kg 131.1 kg 131 kg    Examination:  General exam: Appears calm and comfortable, no acute distress Respiratory system: Bilateral decreased breath sounds at bases Cardiovascular system: S1 & S2 heard, Rate controlled Gastrointestinal system: Abdomen is nondistended, soft and nontender. Normal bowel sounds heard. Extremities: No cyanosis, edema    Data Reviewed: I have personally reviewed following labs and imaging studies  CBC: Recent Labs  Lab 11/10/18 0355 11/12/18 0339 11/13/18 0328 11/14/18 0526  WBC 7.2 11.5* 12.1* 9.9  NEUTROABS  --   --   --  7.2  HGB 13.4 11.4* 9.5* 8.0*  HCT 40.2 33.6* 28.8* 24.4*  MCV 90.3 88.4 88.9 90.4  PLT 181 239 192 588   Basic Metabolic Panel: Recent Labs  Lab 11/10/18 0355 11/12/18 0339 11/13/18 0328 11/14/18 0526  NA 136 136 136 139  K 4.0 4.5 4.5 3.6  CL 100 103 103 101  CO2 30 24 27  32  GLUCOSE 127* 177* 151* 111*  BUN 15 16 16 15   CREATININE 0.79 0.76 0.66 0.63  CALCIUM 8.3* 8.2* 7.8* 7.8*  MG  --   --   --  2.2   GFR: Estimated Creatinine Clearance: 144.4 mL/min (by C-G formula based on SCr of 0.63 mg/dL). Liver Function Tests: No results for input(s): AST, ALT, ALKPHOS, BILITOT, PROT, ALBUMIN in the last 168 hours. No results for input(s): LIPASE, AMYLASE in the last 168  hours. No results for input(s): AMMONIA in the last 168 hours. Coagulation Profile: No results for input(s): INR, PROTIME in the last 168 hours. Cardiac Enzymes: No results for input(s): CKTOTAL, CKMB, CKMBINDEX, TROPONINI in the last 168 hours. BNP (last 3 results) No results for input(s): PROBNP in the last 8760 hours. HbA1C: No results for input(s): HGBA1C in the last 72 hours. CBG: No results for input(s): GLUCAP in the last 168 hours. Lipid Profile: No results for input(s): CHOL, HDL, LDLCALC, TRIG, CHOLHDL, LDLDIRECT in the last 72 hours. Thyroid Function Tests: No results for input(s): TSH, T4TOTAL, FREET4, T3FREE, THYROIDAB in the last 72 hours. Anemia Panel: No results for input(s): VITAMINB12, FOLATE, FERRITIN, TIBC, IRON, RETICCTPCT in the last 72 hours. Sepsis Labs: No results for input(s): PROCALCITON, LATICACIDVEN in the last 168 hours.  Recent Results (from the past 240 hour(s))  Surgical pcr screen     Status: Abnormal   Collection Time: 11/11/18  6:34 AM  Result Value Ref Range Status   MRSA, PCR NEGATIVE NEGATIVE Final   Staphylococcus aureus POSITIVE (A) NEGATIVE Final    Comment: (NOTE) The Xpert SA Assay (FDA approved for NASAL specimens in patients 55 years of age and older), is one component of a comprehensive surveillance program. It is not intended to diagnose infection nor to guide or monitor treatment. Performed at [facility], [place] [address], [place], [place] [address]          Radiology Studies: No results found.      Scheduled Meds: . aspirin EC  325 mg Oral BID  . Chlorhexidine Gluconate Cloth  6 each Topical Daily  . dexamethasone  10 mg Intravenous Once  . diclofenac sodium  2 g Topical QID  . docusate sodium  100 mg Oral BID  . ferrous sulfate  325 mg Oral TID PC  . fluticasone  1 spray Each Nare Daily  . levothyroxine  250 mcg Oral QAC breakfast  . mupirocin ointment  1 application Nasal BID  .  pantoprazole  40 mg Oral Daily  . polyethylene glycol  17 g Oral BID  . pravastatin  40 mg Oral q1800   Continuous Infusions: . sodium chloride Stopped (11/12/18 1553)  . methocarbamol (ROBAXIN) IV       LOS: 8 days        Aline August, MD [facility] Pager [phone]  If 7PM-7AM, please contact night-coverage [email or web address] Password [facility] 11/15/2018, 9:38 AM

## 2018-11-15 NOTE — Progress Notes (Signed)
Physical Therapy Treatment Patient Details Name: Samuel Goodwin MRN: [id number] DOB: Dec 29, 1960 Today's Date: 11/15/2018    History of Present Illness 57 yo male admitted 11/26 for fracture of R femoral prosthesis at femoral neck. Pt s/p R THA revision on 11/11/18, PWB 50% with posterior precautions. PMH includes alcohol abuse, anxiety, OA, vertigo, depression, cancer, hep C, HLD, HTN, OSA, PTSD, L THA 2018, R THA 2017 with attempted reduction after dislocation and subsequent revision, HLD, L TKR 2015.    PT Comments    Pt slowly progressing with mobility and RLE exercises. Pt with less severe pain with mobility this session, but ambulation distance still limited by pain and fatigue. Pt with continued difficulty progressing RLE forward due to pain. PT to continue to follow acutely.    Follow Up Recommendations  Supervision for mobility/OOB;Home health PT     Equipment Recommendations  None recommended by PT    Recommendations for Other Services       Precautions / Restrictions Precautions Precautions: Fall;Posterior Hip Precaution Booklet Issued: Yes (comment) Precaution Comments: Min reinforcement for Posterior hip and WB precautions during session, pt doing well maintaining them  Restrictions Weight Bearing Restrictions: Yes RLE Weight Bearing: Partial weight bearing RLE Partial Weight Bearing Percentage or Pounds: 50%    Mobility  Bed Mobility Overal bed mobility: Needs Assistance Bed Mobility: Supine to Sit     Supine to sit: Min assist     General bed mobility comments: Min assist for RLE abduction, translation, and eccentric lowering at EOB. Pt exited bed toward the R, following posterior hip precautions throughout.   Transfers Overall transfer level: Needs assistance Equipment used: Rolling walker (2 wheeled) Transfers: Sit to/from Stand Sit to Stand: From elevated surface;Min guard         General transfer comment: Min guard for safety, verbal cuing for  keeping trunk erect especially when moving from standing to sitting after ambulation. Pt requires min assist for lowering into recliner for control of descent speed and reinforcements for hip angle <90*.   Ambulation/Gait Ambulation/Gait assistance: Min guard Gait Distance (Feet): 20 Feet Assistive device: Rolling walker (2 wheeled) Gait Pattern/deviations: Step-to pattern;Decreased stance time - right;Decreased weight shift to right;Antalgic Gait velocity: decr    General Gait Details: Min guard for safety. Min verbal reinforcement for RLE positioning when turning towards L and R (toes point outwards).    Stairs             Wheelchair Mobility    Modified Rankin (Stroke Patients Only)       Balance Overall balance assessment: Needs assistance Sitting-balance support: No upper extremity supported Sitting balance-Leahy Scale: Good     Standing balance support: Bilateral upper extremity supported Standing balance-Leahy Scale: Poor Standing balance comment: relies on RW and PT steadying for balance                             Cognition Arousal/Alertness: Awake/alert Behavior During Therapy: WFL for tasks assessed/performed Overall Cognitive Status: Within Functional Limits for tasks assessed                                        Exercises Total Joint Exercises Ankle Circles/Pumps: AROM;Both;Supine;10 reps Quad Sets: AROM;10 reps;Supine;Both(3 second hold ) Heel Slides: AAROM;Right;15 reps;Supine Hip ABduction/ADduction: PROM;Right;Supine;15 reps(Pt tolerates adduction more than adduction) Knee Flexion: Right;AAROM;Seated;15 reps(with towel under  R foot to assist with sliding)    General Comments        Pertinent Vitals/Pain Pain Assessment: 0-10 Pain Score: 7  Pain Location: R thigh, with ambulation specifically with forward progression of RLE.  Pain Descriptors / Indicators: Grimacing;Moaning;Sore Pain Intervention(s): Limited  activity within patient's tolerance;Repositioned;Ice applied;Monitored during session    Home Living                      Prior Function            PT Goals (current goals can now be found in the care plan section) Acute Rehab PT Goals Patient Stated Goal: decrease pain  PT Goal Formulation: With patient Time For Goal Achievement: 11/20/18 Potential to Achieve Goals: Good Progress towards PT goals: Progressing toward goals    Frequency    7X/week      PT Plan Current plan remains appropriate    Co-evaluation              AM-PAC PT "6 Clicks" Mobility   Outcome Measure  Help needed turning from your back to your side while in a flat bed without using bedrails?: A Little Help needed moving from lying on your back to sitting on the side of a flat bed without using bedrails?: A Little Help needed moving to and from a bed to a chair (including a wheelchair)?: A Little Help needed standing up from a chair using your arms (e.g., wheelchair or bedside chair)?: A Little Help needed to walk in hospital room?: A Little Help needed climbing 3-5 steps with a railing? : A Lot 6 Click Score: 17    End of Session Equipment Utilized During Treatment: Gait belt Activity Tolerance: Patient limited by pain;Patient limited by fatigue Patient left: with call bell/phone within reach;with family/visitor present;with SCD's reapplied;in chair Nurse Communication: Mobility status;Other (comment)(premedication before next session ) PT Visit Diagnosis: Other abnormalities of gait and mobility (R26.89);Pain Pain - Right/Left: Right Pain - part of body: Hip;Leg     Time: 1110-1140 PT Time Calculation (min) (ACUTE ONLY): 30 min  Charges:  $Gait Training: 8-22 mins $Therapeutic Exercise: 8-22 mins                    Samuel Goodwin, PT Acute Rehabilitation Services Pager [phone]  Office [phone]   Jonn Chaikin D Elonda Husky 11/15/2018, 12:39 PM

## 2018-11-15 NOTE — Progress Notes (Signed)
Pt. seen for CPAP, has own machine, able to set up/place on independently, remains on room air, aware to notify if help needed.

## 2018-11-15 NOTE — Progress Notes (Signed)
Physical Therapy Treatment Patient Details Name: Samuel Goodwin MRN: [id number] DOB: 1961-04-22 Today's Date: 11/15/2018    History of Present Illness 57 yo male admitted 11/26 for fracture of R femoral prosthesis at femoral neck. Pt s/p R THA revision on 11/11/18, PWB 50% with posterior precautions. PMH includes alcohol abuse, anxiety, OA, vertigo, depression, cancer, hep C, HLD, HTN, OSA, PTSD, L THA 2018, R THA 2017 with attempted reduction after dislocation and subsequent revision, HLD, L TKR 2015.    PT Comments    Pt only able to take 2 steps once standing this session due to extreme pain. At this point, pt's progress with mobility is limited by pain. PT to continue to follow pt over the weekend to progress mobility.  Follow Up Recommendations  Supervision for mobility/OOB;Home health PT(Vs SNF pending clinical improvement)     Equipment Recommendations  None recommended by PT    Recommendations for Other Services       Precautions / Restrictions Precautions Precautions: Fall;Posterior Hip Precaution Booklet Issued: Yes (comment) Precaution Comments: Min reinforcement for Posterior hip and WB precautions during session, pt doing well maintaining them  Restrictions Weight Bearing Restrictions: Yes RLE Weight Bearing: Partial weight bearing RLE Partial Weight Bearing Percentage or Pounds: 50%    Mobility  Bed Mobility Overal bed mobility: Needs Assistance Bed Mobility: Supine to Sit;Sit to Supine     Supine to sit: Min assist     General bed mobility comments: Min assist for RLE management. Pt with very high pain with any mobility this session.   Transfers Overall transfer level: Needs assistance Equipment used: Rolling walker (2 wheeled) Transfers: Sit to/from Stand Sit to Stand: From elevated surface;Mod assist         General transfer comment: Mod assist for power up, increased time to come to standing, verbal cuing for upright trunk and R foot placement to  lessen RLE WB.   Ambulation/Gait Ambulation/Gait assistance: Min guard Gait Distance (Feet): 2 Feet Assistive device: Rolling walker (2 wheeled) Gait Pattern/deviations: Step-to pattern;Decreased stance time - right;Decreased weight shift to right;Antalgic Gait velocity: decr    General Gait Details: Min guard for safety. Pt unable to ambulate further due to moaning in pain.    Stairs             Wheelchair Mobility    Modified Rankin (Stroke Patients Only)       Balance Overall balance assessment: Needs assistance Sitting-balance support: No upper extremity supported Sitting balance-Leahy Scale: Good     Standing balance support: Bilateral upper extremity supported Standing balance-Leahy Scale: Poor Standing balance comment: relies on RW and PT steadying for balance                             Cognition Arousal/Alertness: Awake/alert Behavior During Therapy: WFL for tasks assessed/performed Overall Cognitive Status: Within Functional Limits for tasks assessed                                        Exercises Total Joint Exercises Ankle Circles/Pumps: AROM;Both;Supine;10 reps Quad Sets: AROM;10 reps;Supine;Both(3 second hold ) Heel Slides: AAROM;Right;15 reps;Supine Hip ABduction/ADduction: PROM;Right;Supine;15 reps Knee Flexion: Right;AAROM;Seated;15 reps(with towel under R foot to assist with sliding)    General Comments        Pertinent Vitals/Pain Pain Assessment: 0-10 Pain Score: 7  Faces Pain Scale: Hurts  whole lot Pain Location: R thigh, with mobility  Pain Descriptors / Indicators: Grimacing;Moaning;Sore Pain Intervention(s): Limited activity within patient's tolerance;Repositioned;Monitored during session    Home Living                      Prior Function            PT Goals (current goals can now be found in the care plan section) Acute Rehab PT Goals Patient Stated Goal: decrease pain  PT Goal  Formulation: With patient Time For Goal Achievement: 11/20/18 Potential to Achieve Goals: Good Progress towards PT goals: Not progressing toward goals - comment(pain still very high at this point)    Frequency    7X/week      PT Plan Current plan remains appropriate    Co-evaluation              AM-PAC PT "6 Clicks" Mobility   Outcome Measure  Help needed turning from your back to your side while in a flat bed without using bedrails?: A Little Help needed moving from lying on your back to sitting on the side of a flat bed without using bedrails?: A Little Help needed moving to and from a bed to a chair (including a wheelchair)?: A Little Help needed standing up from a chair using your arms (e.g., wheelchair or bedside chair)?: A Little Help needed to walk in hospital room?: A Little Help needed climbing 3-5 steps with a railing? : A Lot 6 Click Score: 17    End of Session Equipment Utilized During Treatment: Gait belt Activity Tolerance: Patient limited by pain;Patient limited by fatigue Patient left: with call bell/phone within reach;with family/visitor present;with bed alarm set;in bed;with SCD's reapplied Nurse Communication: Mobility status PT Visit Diagnosis: Other abnormalities of gait and mobility (R26.89);Pain Pain - Right/Left: Right Pain - part of body: Hip;Leg     Time: 1400-1420 PT Time Calculation (min) (ACUTE ONLY): 20 min  Charges:  $Gait Training: 8-22 mins $Therapeutic Exercise: 8-22 mins $Therapeutic Activity: 8-22 mins                     [name], PT Acute Rehabilitation Services Pager [phone]  Office [phone]    Katherine Tout D Aireanna Luellen 11/15/2018, 3:08 PM

## 2018-11-16 MED ORDER — BISACODYL 10 MG RE SUPP: 10.0000 mg | Freq: Every day | RECTAL | 0 refills | Status: DC | PRN

## 2018-11-16 NOTE — Progress Notes (Signed)
Physical Therapy Treatment Patient Details Name: Samuel Goodwin MRN: [id number] DOB: September 27, 1961 Today's Date: 11/16/2018    History of Present Illness 57 yo male admitted 11/26 for fracture of R femoral prosthesis at femoral neck. Pt s/p R THA revision on 11/11/18, PWB 50% with posterior precautions. PMH includes alcohol abuse, anxiety, OA, vertigo, depression, cancer, hep C, HLD, HTN, OSA, PTSD, L THA 2018, R THA 2017 with attempted reduction after dislocation and subsequent revision, HLD, L TKR 2015.    PT Comments    POD # 5 pm session Daughter who is a PTA was present and assisted.  Assisted pt OOB to amb an increased distance using Bariatric walker and advancing L LE first while maintaining PWB.  Max c/o "cramping" "tightness" in the thigh.  Assisted back to bed and performed all supine TE's using belt following HEP handout.  Instructed on proper tech and freq.  Applied ICE.   Follow Up Recommendations  Supervision for mobility/OOB;Home health PT;SNF(pending progress)     Equipment Recommendations  Rolling walker with 5" wheels    Recommendations for Other Services       Precautions / Restrictions Precautions Precautions: Fall;Posterior Hip Precaution Comments: handout given  Restrictions Weight Bearing Restrictions: Yes RLE Weight Bearing: Partial weight bearing RLE Partial Weight Bearing Percentage or Pounds: 50%    Mobility  Bed Mobility Overal bed mobility: Needs Assistance Bed Mobility: Supine to Sit;Sit to Supine     Supine to sit: Min assist Sit to supine: Mod assist   General bed mobility comments: demonstarted and instructed pt how to use a belt to self assist R LE off bed but required increased assist back to bed due to increased pain  Transfers Overall transfer level: Needs assistance Equipment used: Rolling walker (2 wheeled) Transfers: Sit to/from Stand Sit to Stand: From elevated surface;Mod assist;Min assist         General transfer comment:  25% VC's on proper hand placement and R LE advancement to avoid hip flex > 90  Required increased time   Ambulation/Gait Ambulation/Gait assistance: Min guard Gait Distance (Feet): 45 Feet Assistive device: Rolling walker (2 wheeled)(Bariatric walker ) Gait Pattern/deviations: Step-to pattern;Decreased stance time - right;Decreased weight shift to right;Antalgic Gait velocity: decreased   General Gait Details: 25% VC'S for proper walker to self distance and safety with turns  pt does well to maintain PWB and advance L LE first   Stairs             Wheelchair Mobility    Modified Rankin (Stroke Patients Only)       Balance                                            Cognition Arousal/Alertness: Awake/alert Behavior During Therapy: WFL for tasks assessed/performed Overall Cognitive Status: Within Functional Limits for tasks assessed                                        Exercises      General Comments        Pertinent Vitals/Pain Pain Assessment: 0-10 Pain Score: 8  Pain Location: R thigh, with mobility  Pain Descriptors / Indicators: Grimacing;Sore;Operative site guarding;Cramping Pain Intervention(s): Monitored during session;Patient requesting pain meds-RN notified;Repositioned;Ice applied    Home Living  Prior Function            PT Goals (current goals can now be found in the care plan section) Progress towards PT goals: Progressing toward goals    Frequency    7X/week      PT Plan Current plan remains appropriate    Co-evaluation              AM-PAC PT "6 Clicks" Mobility   Outcome Measure  Help needed turning from your back to your side while in a flat bed without using bedrails?: A Little Help needed moving from lying on your back to sitting on the side of a flat bed without using bedrails?: A Little Help needed moving to and from a bed to a chair (including a  wheelchair)?: A Little Help needed standing up from a chair using your arms (e.g., wheelchair or bedside chair)?: A Little Help needed to walk in hospital room?: A Little Help needed climbing 3-5 steps with a railing? : A Little 6 Click Score: 18    End of Session Equipment Utilized During Treatment: Gait belt Activity Tolerance: Patient limited by pain Patient left: in bed;with call bell/phone within reach;with family/visitor present Nurse Communication: Mobility status PT Visit Diagnosis: Unsteadiness on feet (R26.81) Pain - Right/Left: Right Pain - part of body: Hip;Leg     Time: 1550-1620 PT Time Calculation (min) (ACUTE ONLY): 30 min  Charges:  $Gait Training: 8-22 mins $Therapeutic Exercise: 8-22 mins                     Rica Koyanagi  PTA Acute  Rehabilitation Services Pager      [phone] Office      [phone]

## 2018-11-16 NOTE — Progress Notes (Signed)
Patient ID: Samuel Goodwin, male   DOB: 05/20/1961, 57 y.o.   MRN: [id number]  PROGRESS NOTE    Samuel Goodwin  LZJ:[id number] DOB: 1961/10/02 DOA: 11/05/2018 PCP: [name], MD   Brief Narrative:  57 year old male with history of alcohol abuse, anxiety, depression, GERD, hep C status post interferon in 1994, hypertension, hyperlipidemia, hypothyroidism, OSA presented for evaluation of right hip pain on 11/05/2018.  He was found to have fracture of the right femoral prosthesis and underwent surgery on 11/11/2018 by orthopedics.   Assessment & Plan:   Principal Problem:   Femoral fracture (HCC) Active Problems:   Hypothyroidism   HTN (hypertension)   GERD (gastroesophageal reflux disease)   Chronic anemia   Reactive airway disease   Hip fracture (HCC)   Fracture of right femoral prosthesis -Status post surgery on 11/11/2018 -Orthopedics following. -Still in significant pain intermittently, gets worse with even minimal exercise. Didn't tolerate PT yesterday well.  Continue pain management.  Follow PT recommendations.  If continues to have issues with pain, might benefit from short-term rehab.  Constipation -Resolved.  Continue bowel regimen  Essential hypertension -Blood pressure stable.  Antihypertensives on hold.  Acute blood loss anemia -Probably secondary to femoral fracture and operative blood loss -Monitor hemoglobin.  Transfuse if hemoglobin is less than 7  History of reactive airway disease -Stable  Hypothyroidism -Continue Synthroid  GERD Continue PPI  Obesity -Outpatient follow-up     DVT prophylaxis: Lovenox  Code Status: Full Family Communication: Wife at bedside Disposition Plan: Home vs SNF probably in 1 to 2 days pending improvement with PT Consultants: Orthopedics  Procedures: Right total hip arthroplasty revision on 11/11/2018  Antimicrobials: None   Subjective: Patient seen and examined at bedside.  Feels better this morning.  Couldn't do much physical therapy yesterday due to pain. No fever or vomiting. Objective: Vitals:   11/15/18 0515 11/15/18 1346 11/15/18 2249 11/16/18 0526  BP: 126/77 128/75 122/66 122/80  Pulse: 68 72 74 74  Resp: 16 14 18 18   Temp: 97.7 F (36.5 C) 98.6 F (37 C) 98 F (36.7 C) 97.6 F (36.4 C)  TempSrc: Oral  Oral   SpO2: 100% 95% 93% 100%  Weight:      Height:        Intake/Output Summary (Last 24 hours) at 11/16/2018 0845 Last data filed at 11/16/2018 0240 Gross per 24 hour  Intake 1080 ml  Output 1365 ml  Net -285 ml   Filed Weights   11/05/18 2243 11/06/18 1059 11/11/18 1430  Weight: 131.1 kg 131.1 kg 131 kg    Examination:  General exam: no acute distress Respiratory system: Bilateral decreased breath sounds at bases Cardiovascular system: S1 & S2 heard, Rate controlled Gastrointestinal system: Abdomen is nondistended, soft and nontender. Normal bowel sounds heard. Extremities: No cyanosis, edema    Data Reviewed: I have personally reviewed following labs and imaging studies  CBC: Recent Labs  Lab 11/10/18 0355 11/12/18 0339 11/13/18 0328 11/14/18 0526  WBC 7.2 11.5* 12.1* 9.9  NEUTROABS  --   --   --  7.2  HGB 13.4 11.4* 9.5* 8.0*  HCT 40.2 33.6* 28.8* 24.4*  MCV 90.3 88.4 88.9 90.4  PLT 181 239 192 973   Basic Metabolic Panel: Recent Labs  Lab 11/10/18 0355 11/12/18 0339 11/13/18 0328 11/14/18 0526  NA 136 136 136 139  K 4.0 4.5 4.5 3.6  CL 100 103 103 101  CO2 30 24 27  32  GLUCOSE 127* 177* 151* 111*  BUN 15 16 16 15   CREATININE 0.79 0.76 0.66 0.63  CALCIUM 8.3* 8.2* 7.8* 7.8*  MG  --   --   --  2.2   GFR: Estimated Creatinine Clearance: 144.4 mL/min (by C-G formula based on SCr of 0.63 mg/dL). Liver Function Tests: No results for input(s): AST, ALT, ALKPHOS, BILITOT, PROT, ALBUMIN in the last 168 hours. No results for input(s): LIPASE, AMYLASE in the last 168 hours. No results for input(s): AMMONIA in the last 168  hours. Coagulation Profile: No results for input(s): INR, PROTIME in the last 168 hours. Cardiac Enzymes: No results for input(s): CKTOTAL, CKMB, CKMBINDEX, TROPONINI in the last 168 hours. BNP (last 3 results) No results for input(s): PROBNP in the last 8760 hours. HbA1C: No results for input(s): HGBA1C in the last 72 hours. CBG: No results for input(s): GLUCAP in the last 168 hours. Lipid Profile: No results for input(s): CHOL, HDL, LDLCALC, TRIG, CHOLHDL, LDLDIRECT in the last 72 hours. Thyroid Function Tests: No results for input(s): TSH, T4TOTAL, FREET4, T3FREE, THYROIDAB in the last 72 hours. Anemia Panel: No results for input(s): VITAMINB12, FOLATE, FERRITIN, TIBC, IRON, RETICCTPCT in the last 72 hours. Sepsis Labs: No results for input(s): PROCALCITON, LATICACIDVEN in the last 168 hours.  Recent Results (from the past 240 hour(s))  Surgical pcr screen     Status: Abnormal   Collection Time: 11/11/18  6:34 AM  Result Value Ref Range Status   MRSA, PCR NEGATIVE NEGATIVE Final   Staphylococcus aureus POSITIVE (A) NEGATIVE Final    Comment: (NOTE) The Xpert SA Assay (FDA approved for NASAL specimens in patients 39 years of age and older), is one component of a comprehensive surveillance program. It is not intended to diagnose infection nor to guide or monitor treatment. Performed at [facility], [place] [address], [place], [place] [address]          Radiology Studies: No results found.      Scheduled Meds: . aspirin EC  325 mg Oral BID  . Chlorhexidine Gluconate Cloth  6 each Topical Daily  . dexamethasone  10 mg Intravenous Once  . diclofenac sodium  2 g Topical QID  . docusate sodium  100 mg Oral BID  . ferrous sulfate  325 mg Oral TID PC  . fluticasone  1 spray Each Nare Daily  . levothyroxine  250 mcg Oral QAC breakfast  . mupirocin ointment  1 application Nasal BID  . pantoprazole  40 mg Oral Daily  . polyethylene glycol  17 g Oral  BID  . pravastatin  40 mg Oral q1800   Continuous Infusions: . sodium chloride Stopped (11/12/18 1553)  . methocarbamol (ROBAXIN) IV       LOS: 9 days        Aline August, MD [facility] Pager [phone]  If 7PM-7AM, please contact night-coverage [email or web address] Password [email or web address] 11/16/2018, 8:45 AM

## 2018-11-16 NOTE — Progress Notes (Signed)
Physical Therapy Treatment Patient Details Name: Samuel Goodwin MRN: [id number] DOB: 26-May-1961 Today's Date: 11/16/2018    History of Present Illness 57 yo male admitted 11/26 for fracture of R femoral prosthesis at femoral neck. Pt s/p R THA revision on 11/11/18, PWB 50% with posterior precautions. PMH includes alcohol abuse, anxiety, OA, vertigo, depression, cancer, hep C, HLD, HTN, OSA, PTSD, L THA 2018, R THA 2017 with attempted reduction after dislocation and subsequent revision, HLD, L TKR 2015.    PT Comments    POD # 5 am session Assisted OOB using a strap to self assist R LE off bed.  Required increased time.  Assisted with amb in hallway pt did better with Bariatric (wider) walker better able to stay inside.  Performed some TE's given handout on HEP.     Follow Up Recommendations  Supervision for mobility/OOB;Home health PT;SNF(pending progress)     Equipment Recommendations  Rolling walker with 5" wheels(Bariatric Walker )    Recommendations for Other Services       Precautions / Restrictions Precautions Precautions: Fall;Posterior Hip Precaution Booklet Issued: Yes (comment) Precaution Comments: handout given  Restrictions Weight Bearing Restrictions: Yes RLE Weight Bearing: Partial weight bearing RLE Partial Weight Bearing Percentage or Pounds: 50%    Mobility  Bed Mobility Overal bed mobility: Needs Assistance Bed Mobility: Supine to Sit     Supine to sit: Min assist     General bed mobility comments: demonstarted and instructed pt how to use a belt to self assist R LE off bed   Transfers Overall transfer level: Needs assistance Equipment used: Rolling walker (2 wheeled) Transfers: Sit to/from Stand Sit to Stand: From elevated surface;Mod assist;Min assist         General transfer comment: 25% VC's on proper hand placement and R LE advancement to avoid hip flex > 90  Ambulation/Gait Ambulation/Gait assistance: Min guard Gait Distance (Feet): 35  Feet Assistive device: Rolling walker (2 wheeled) Gait Pattern/deviations: Step-to pattern;Decreased stance time - right;Decreased weight shift to right;Antalgic Gait velocity: decreased   General Gait Details: 25% VC'S for proper walker to self distance and safety with turns   Stairs             Wheelchair Mobility    Modified Rankin (Stroke Patients Only)       Balance                                            Cognition Arousal/Alertness: Awake/alert Behavior During Therapy: WFL for tasks assessed/performed Overall Cognitive Status: Within Functional Limits for tasks assessed                                        Exercises      General Comments        Pertinent Vitals/Pain Pain Assessment: 0-10 Pain Score: 5  Pain Location: R thigh, with mobility  Pain Descriptors / Indicators: Grimacing;Sore;Operative site guarding Pain Intervention(s): Monitored during session;Repositioned;Premedicated before session;Ice applied    Home Living                      Prior Function            PT Goals (current goals can now be found in the care plan section) Progress towards  PT goals: Progressing toward goals    Frequency    7X/week      PT Plan Current plan remains appropriate    Co-evaluation              AM-PAC PT "6 Clicks" Mobility   Outcome Measure  Help needed turning from your back to your side while in a flat bed without using bedrails?: A Little Help needed moving from lying on your back to sitting on the side of a flat bed without using bedrails?: A Little Help needed moving to and from a bed to a chair (including a wheelchair)?: A Little Help needed standing up from a chair using your arms (e.g., wheelchair or bedside chair)?: A Little Help needed to walk in hospital room?: A Little Help needed climbing 3-5 steps with a railing? : A Lot 6 Click Score: 17    End of Session   Activity  Tolerance: Patient limited by pain Patient left: with call bell/phone within reach;with family/visitor present;with bed alarm set;with SCD's reapplied;in chair   PT Visit Diagnosis: Other abnormalities of gait and mobility (R26.89);Pain Pain - Right/Left: Right Pain - part of body: Hip;Leg     Time: 6644-0347 PT Time Calculation (min) (ACUTE ONLY): 25 min  Charges:  $Gait Training: 8-22 mins $Therapeutic Activity: 8-22 mins                     Rica Koyanagi  PTA Acute  Rehabilitation Services Pager      [phone] Office      [phone]

## 2018-11-16 NOTE — Progress Notes (Addendum)
Subjective: 5 Days Post-Op Procedure(s) (LRB): right total hip arthroplasty revision, femoral stem (Right) Patient reports pain as 3 on 0-10 scale.    Objective: Vital signs in last 24 hours: Temp:  [97.6 F (36.4 C)-98.6 F (37 C)] 97.6 F (36.4 C) (12/07 0526) Pulse Rate:  [72-74] 74 (12/07 0526) Resp:  [14-18] 18 (12/07 0526) BP: (122-128)/(66-80) 122/80 (12/07 0526) SpO2:  [93 %-100 %] 100 % (12/07 0526)  Intake/Output from previous day: 12/06 0701 - 12/07 0700 In: 1200 [P.O.:1200] Out: 1365 [Urine:1365] Intake/Output this shift: No intake/output data recorded.  Recent Labs    11/14/18 0526  HGB 8.0*   Recent Labs    11/14/18 0526  WBC 9.9  RBC 2.70*  HCT 24.4*  PLT 186   Recent Labs    11/14/18 0526  NA 139  K 3.6  CL 101  CO2 32  BUN 15  CREATININE 0.63  GLUCOSE 111*  CALCIUM 7.8*   No results for input(s): LABPT, INR in the last 72 hours.  Neurologically intact ABD soft Neurovascular intact Sensation intact distally Intact pulses distally Dorsiflexion/Plantar flexion intact Incision: dressing C/D/Isoft Moderate swelling    Assessment/Plan: 5 Days Post-Op Procedure(s) (LRB): right total hip arthroplasty revision, femoral stem (Right) Advance diet Up with therapy D/C IV fluids Elevate leg  D/c per medicine   Dellis Filbert C Gamal Todisco 11/16/2018, 8:16 AM

## 2018-11-17 ENCOUNTER — Inpatient Hospital Stay (HOSPITAL_COMMUNITY): Payer: No Typology Code available for payment source

## 2018-11-17 NOTE — Progress Notes (Signed)
Patient ID: Samuel Goodwin, male   DOB: 1961-07-01, 57 y.o.   MRN: [id number]  PROGRESS NOTE    Samuel Goodwin  IDP:[id number] DOB: 1961/02/27 DOA: 11/05/2018 PCP: [name], MD   Brief Narrative:  57 year old male with history of alcohol abuse, anxiety, depression, GERD, hep C status post interferon in 1994, hypertension, hyperlipidemia, hypothyroidism, OSA presented for evaluation of right hip pain on 11/05/2018.  He was found to have fracture of the right femoral prosthesis and underwent surgery on 11/11/2018 by orthopedics.   Assessment & Plan:   Principal Problem:   Femoral fracture (HCC) Active Problems:   Hypothyroidism   HTN (hypertension)   GERD (gastroesophageal reflux disease)   Chronic anemia   Reactive airway disease   Hip fracture (HCC)   Fracture of right femoral prosthesis -Status post surgery on 11/11/2018 -Orthopedics following. -Slightly progressing with PT yesterday.  We will follow-up with PT recommendations  from today.  Continue pain management.  If continues to have issues with pain, might benefit from short-term rehab.  Constipation -Resolved.  Continue bowel regimen  Essential hypertension -Blood pressure stable.  Antihypertensives on hold.  Acute blood loss anemia -Probably secondary to femoral fracture and operative blood loss -Monitor hemoglobin.  Transfuse if hemoglobin is less than 7  History of reactive airway disease -Stable  Hypothyroidism -Continue Synthroid  GERD Continue PPI  Obesity -Outpatient follow-up     DVT prophylaxis: Lovenox  Code Status: Full Family Communication: None at bedside Disposition Plan: Home vs SNF probably  pending improvement with PT Consultants: Orthopedics  Procedures: Right total hip arthroplasty revision on 11/11/2018  Antimicrobials: None   Subjective: Patient seen and examined at bedside.  Feels better this morning.  Did some more physical therapy yesterday.  Is hopeful that she will  improve more today and hoping to go home soon.  No overnight fever or vomiting.  Objective: Vitals:   11/16/18 0526 11/16/18 1351 11/16/18 2151 11/17/18 0517  BP: 122/80 134/63 126/72 116/70  Pulse: 74 84 75 65  Resp: 18 16 16 14   Temp: 97.6 F (36.4 C) 97.9 F (36.6 C) 98.5 F (36.9 C) 98 F (36.7 C)  TempSrc:  Oral Oral Oral  SpO2: 100% 99% 99% 97%  Weight:      Height:        Intake/Output Summary (Last 24 hours) at 11/17/2018 1122 Last data filed at 11/17/2018 1119 Gross per 24 hour  Intake 560 ml  Output 2750 ml  Net -2190 ml   Filed Weights   11/05/18 2243 11/06/18 1059 11/11/18 1430  Weight: 131.1 kg 131.1 kg 131 kg    Examination:  General exam: no  distress Respiratory system: Bilateral decreased breath sounds at bases Cardiovascular system: S1 & S2 heard, Rate controlled Gastrointestinal system: Abdomen is nondistended, soft and nontender. Extremities: No cyanosis, edema    Data Reviewed: I have personally reviewed following labs and imaging studies  CBC: Recent Labs  Lab 11/12/18 0339 11/13/18 0328 11/14/18 0526  WBC 11.5* 12.1* 9.9  NEUTROABS  --   --  7.2  HGB 11.4* 9.5* 8.0*  HCT 33.6* 28.8* 24.4*  MCV 88.4 88.9 90.4  PLT 239 192 443   Basic Metabolic Panel: Recent Labs  Lab 11/12/18 0339 11/13/18 0328 11/14/18 0526  NA 136 136 139  K 4.5 4.5 3.6  CL 103 103 101  CO2 24 27 32  GLUCOSE 177* 151* 111*  BUN 16 16 15   CREATININE 0.76 0.66 0.63  CALCIUM 8.2* 7.8*  7.8*  MG  --   --  2.2   GFR: Estimated Creatinine Clearance: 144.4 mL/min (by C-G formula based on SCr of 0.63 mg/dL). Liver Function Tests: No results for input(s): AST, ALT, ALKPHOS, BILITOT, PROT, ALBUMIN in the last 168 hours. No results for input(s): LIPASE, AMYLASE in the last 168 hours. No results for input(s): AMMONIA in the last 168 hours. Coagulation Profile: No results for input(s): INR, PROTIME in the last 168 hours. Cardiac Enzymes: No results for input(s):  CKTOTAL, CKMB, CKMBINDEX, TROPONINI in the last 168 hours. BNP (last 3 results) No results for input(s): PROBNP in the last 8760 hours. HbA1C: No results for input(s): HGBA1C in the last 72 hours. CBG: No results for input(s): GLUCAP in the last 168 hours. Lipid Profile: No results for input(s): CHOL, HDL, LDLCALC, TRIG, CHOLHDL, LDLDIRECT in the last 72 hours. Thyroid Function Tests: No results for input(s): TSH, T4TOTAL, FREET4, T3FREE, THYROIDAB in the last 72 hours. Anemia Panel: No results for input(s): VITAMINB12, FOLATE, FERRITIN, TIBC, IRON, RETICCTPCT in the last 72 hours. Sepsis Labs: No results for input(s): PROCALCITON, LATICACIDVEN in the last 168 hours.  Recent Results (from the past 240 hour(s))  Surgical pcr screen     Status: Abnormal   Collection Time: 11/11/18  6:34 AM  Result Value Ref Range Status   MRSA, PCR NEGATIVE NEGATIVE Final   Staphylococcus aureus POSITIVE (A) NEGATIVE Final    Comment: (NOTE) The Xpert SA Assay (FDA approved for NASAL specimens in patients 12 years of age and older), is one component of a comprehensive surveillance program. It is not intended to diagnose infection nor to guide or monitor treatment. Performed at [facility], [place] [address] [place], [place] [address]          Radiology Studies: No results found.      Scheduled Meds: . aspirin EC  325 mg Oral BID  . dexamethasone  10 mg Intravenous Once  . diclofenac sodium  2 g Topical QID  . docusate sodium  100 mg Oral BID  . ferrous sulfate  325 mg Oral TID PC  . fluticasone  1 spray Each Nare Daily  . levothyroxine  250 mcg Oral QAC breakfast  . pantoprazole  40 mg Oral Daily  . polyethylene glycol  17 g Oral BID  . pravastatin  40 mg Oral q1800   Continuous Infusions: . sodium chloride Stopped (11/12/18 1553)  . methocarbamol (ROBAXIN) IV       LOS: 10 days        Aline August, MD [facility] Pager [phone]  If  7PM-7AM, please contact night-coverage [email or web address] Password [email or web address] 11/17/2018, 11:22 AM

## 2018-11-17 NOTE — Progress Notes (Signed)
Physical Therapy Treatment Patient Details Name: Samuel Goodwin MRN: [id number] DOB: 08-19-1961 Today's Date: 11/17/2018    History of Present Illness 57 yo male admitted 11/26 for fracture of R femoral prosthesis at femoral neck. Pt s/p R THA revision on 11/11/18, PWB 50% with posterior precautions. PMH includes alcohol abuse, anxiety, OA, vertigo, depression, cancer, hep C, HLD, HTN, OSA, PTSD, L THA 2018, R THA 2017 with attempted reduction after dislocation and subsequent revision, HLD, L TKR 2015.    PT Comments    Pt continues to progress steadily with mobility and eager for return home.  Spouse present and reviewed home therex stairs and car transfers.   Follow Up Recommendations  Supervision for mobility/OOB;Home health PT     Equipment Recommendations  Rolling walker with 5" wheels    Recommendations for Other Services       Precautions / Restrictions Precautions Precautions: Fall;Posterior Hip Precaution Booklet Issued: Yes (comment) Precaution Comments: Pt recalls all THP Restrictions Weight Bearing Restrictions: Yes RLE Weight Bearing: Partial weight bearing RLE Partial Weight Bearing Percentage or Pounds: 50%    Mobility  Bed Mobility Overal bed mobility: Needs Assistance Bed Mobility: Supine to Sit     Supine to sit: Supervision Sit to supine: Min assist   General bed mobility comments: increased time and use of leg lifter  Transfers Overall transfer level: Needs assistance Equipment used: Rolling walker (2 wheeled) Transfers: Sit to/from Stand Sit to Stand: Min guard;Supervision         General transfer comment: min cues for LE management  Ambulation/Gait Ambulation/Gait assistance: Min guard;Supervision Gait Distance (Feet): 40 Feet Assistive device: Rolling walker (2 wheeled) Gait Pattern/deviations: Step-to pattern;Decreased stance time - right;Decreased weight shift to right;Antalgic Gait velocity: decreased   General Gait Details:  Increased time; pt self cueing for sequence and adherence to THP in turns   Stairs Stairs: Yes Stairs assistance: Min assist Stair Management: No rails;Step to pattern;Backwards;With walker Number of Stairs: 2 General stair comments: single step twice bkwd with RW and cues for sequence and foot/RW placement   Wheelchair Mobility    Modified Rankin (Stroke Patients Only)       Balance Overall balance assessment: Needs assistance Sitting-balance support: No upper extremity supported Sitting balance-Leahy Scale: Good     Standing balance support: No upper extremity supported Standing balance-Leahy Scale: Fair                              Cognition Arousal/Alertness: Awake/alert Behavior During Therapy: WFL for tasks assessed/performed Overall Cognitive Status: Within Functional Limits for tasks assessed                                        Exercises Total Joint Exercises Ankle Circles/Pumps: AROM;Both;Supine;10 reps Quad Sets: AROM;10 reps;Supine;Both Short Arc Quad: AAROM;Right;15 reps;Supine Heel Slides: AAROM;Right;Supine;20 reps Hip ABduction/ADduction: PROM;Right;Supine;15 reps    General Comments        Pertinent Vitals/Pain Pain Assessment: 0-10 Pain Score: 6  Pain Location: R thigh, with mobility  Pain Descriptors / Indicators: Aching;Grimacing;Sore Pain Intervention(s): Limited activity within patient's tolerance;Monitored during session;Premedicated before session    Home Living                      Prior Function            PT Goals (  current goals can now be found in the care plan section) Acute Rehab PT Goals Patient Stated Goal: Regain IND PT Goal Formulation: With patient Time For Goal Achievement: 11/20/18 Potential to Achieve Goals: Good Progress towards PT goals: Progressing toward goals    Frequency    7X/week      PT Plan Current plan remains appropriate    Co-evaluation               AM-PAC PT "6 Clicks" Mobility   Outcome Measure  Help needed turning from your back to your side while in a flat bed without using bedrails?: A Little Help needed moving from lying on your back to sitting on the side of a flat bed without using bedrails?: A Little Help needed moving to and from a bed to a chair (including a wheelchair)?: A Little Help needed standing up from a chair using your arms (e.g., wheelchair or bedside chair)?: A Little Help needed to walk in hospital room?: A Little Help needed climbing 3-5 steps with a railing? : A Little 6 Click Score: 18    End of Session Equipment Utilized During Treatment: Gait belt Activity Tolerance: Patient tolerated treatment well Patient left: Other (comment)(sitting EOB) Nurse Communication: Mobility status PT Visit Diagnosis: Difficulty in walking, not elsewhere classified (R26.2) Pain - Right/Left: Right Pain - part of body: Hip;Leg     Time: 8270-7867 PT Time Calculation (min) (ACUTE ONLY): 32 min  Charges:  $Gait Training: 8-22 mins $Therapeutic Exercise: 8-22 mins $Therapeutic Activity: 8-22 mins                     Samuel Goodwin PT Acute Rehabilitation Services Pager [phone] Office [phone]    Samuel Goodwin 11/17/2018, 2:36 PM

## 2018-11-17 NOTE — Care Management Note (Signed)
Case Management Note  Patient Details  Name: Samuel Goodwin MRN: [id number] Date of Birth: [birth year]  Subjective/Objective:  Femoral fracture, s/p R THA                  Action/Plan: NCM spoke to pt and [facility] delivered a wide RW to room. Explained to pt that Christella Scheuermann uses [facility] to arrange [facility] and they will arrange him getting a wide 3n1 bedside commode. Faxed orders, dc summary, facesheet, and op note to [facility]. Provided pt with contact info for [facility].   Expected Discharge Date:  11/17/18               Expected Discharge Plan:  [place]  In-House Referral:  NA  Discharge planning Services  CM Consult  Post Acute Care Choice:  Home Health Choice offered to:  Patient  DME Arranged:  3-N-1, Walker rolling, Other see comment DME Agency:  [place]., Other - Comment  HH Arranged:  PT [place] Agency:  Other - See comment  Status of Service:  Completed, signed off  If discussed at [place] of Stay Meetings, dates discussed:    Additional Comments:  Erenest Rasher, RN 11/17/2018, 2:31 PM

## 2018-11-17 NOTE — Discharge Summary (Addendum)
Physician Discharge Summary  Samuel Goodwin ZOX:[id number] DOB: [birth year] DOA: 11/05/2018  PCP: [name], MD  Admit date: 11/05/2018 Discharge date: 11/17/2018  Admitted From: Home Disposition: Home  Recommendations for Outpatient Follow-up:  1. Follow up with PCP in 1 week 2. Follow-up with orthopedics as an outpatient 3. Activity as per orthopedic/PT recommendations 4. Wound care as per orthopedics recommendations 5. Follow-up in the ED if symptoms worsen or new appear   Home Health: Yes: Home health PT Equipment/Devices: No Discharge Condition: Stable CODE STATUS: Full Diet recommendation: Heart Healthy  Brief/Interim Summary: 57 year old male with history of alcohol abuse, anxiety, depression, GERD, hep C status post interferon in 1994, hypertension, hyperlipidemia, hypothyroidism, OSA presented for evaluation of right hip pain on 11/05/2018.  He was found to have fracture of the right femoral prosthesis and underwent surgery on 11/11/2018 by orthopedics.  He is currently tolerating PT, orthopedics has cleared him for discharge.  He will be discharged home with home health PT.  Discharge Diagnoses:  Principal Problem:   Femoral fracture ([place]) Active Problems:   Hypothyroidism   HTN (hypertension)   GERD (gastroesophageal reflux disease)   Chronic anemia   Reactive airway disease   Hip fracture ([facility])  Fracture of right femoral prosthesis -Status post surgery on 11/11/2018 -Orthopedics following. -Improving with PT finally.  Orthopedics has cleared the patient for discharge.  Activity as per orthopedic/PT recommendations.  Wound care as per orthopedics recommendations. -Outpatient follow-up with orthopedics -Discharge patient home with home health PT  Constipation -Resolved.  Continue bowel regimen  Essential hypertension -Blood pressure stable.  Resume home regimen.  Outpatient follow-up  Acute blood loss anemia -Probably secondary to femoral fracture and  operative blood loss -Monitor hemoglobin.  Transfuse if hemoglobin is less than 7  History of reactive airway disease -Stable  Hypothyroidism -Continue Synthroid  GERD Continue PPI  Obesity -Outpatient follow-up  Discharge Instructions  Discharge Instructions    Call MD for:  difficulty breathing, headache or visual disturbances   Complete by:  As directed    Call MD for:  extreme fatigue   Complete by:  As directed    Call MD for:  hives   Complete by:  As directed    Call MD for:  persistant dizziness or light-headedness   Complete by:  As directed    Call MD for:  persistant nausea and vomiting   Complete by:  As directed    Call MD for:  redness, tenderness, or signs of infection (pain, swelling, redness, odor or green/yellow discharge around incision site)   Complete by:  As directed    Call MD for:  severe uncontrolled pain   Complete by:  As directed    Call MD for:  temperature >100.4   Complete by:  As directed    Diet - low sodium heart healthy   Complete by:  As directed    Increase activity slowly   Complete by:  As directed      Allergies as of 11/17/2018      Reactions   Dilaudid [hydromorphone Hcl] Nausea And Vomiting   "Questionable as to whether it was actually dilaudid  That made me so severely nauseous and vomit"   Benazepril Hcl Cough      Medication List    STOP taking these medications   aspirin EC 81 MG tablet Replaced by:  aspirin 81 MG chewable tablet   celecoxib 200 MG capsule Commonly known as:  CELEBREX   diclofenac sodium 1 %  Gel Commonly known as:  VOLTAREN   Fish Oil 1000 MG Caps   omeprazole 20 MG capsule Commonly known as:  PRILOSEC     TAKE these medications   albuterol 108 (90 Base) MCG/ACT inhaler Commonly known as:  PROVENTIL HFA;VENTOLIN HFA Inhale 2 puffs into the lungs every 6 (six) hours as needed for wheezing or shortness of breath.   aspirin 81 MG chewable tablet Chew 1 tablet (81 mg total) by mouth 2  (two) times daily. Take for 4 weeks, then resume regular dose. Replaces:  aspirin EC 81 MG tablet   b complex vitamins tablet Take 2 tablets by mouth daily.   bisacodyl 10 MG suppository Commonly known as:  DULCOLAX Place 1 suppository (10 mg total) rectally daily as needed for moderate constipation.   docusate sodium 100 MG capsule Commonly known as:  COLACE Take 1 capsule (100 mg total) by mouth 2 (two) times daily.   ferrous sulfate 325 (65 FE) MG tablet Take 1 tablet (325 mg total) by mouth 3 (three) times daily with meals. What changed:  when to take this   fluticasone 50 MCG/ACT nasal spray Commonly known as:  FLONASE Place 1 spray into both nostrils daily.   hydrochlorothiazide 25 MG tablet Commonly known as:  HYDRODIURIL Take 1 tablet (25 mg total) by mouth daily.   HYDROcodone-acetaminophen 7.5-325 MG tablet Commonly known as:  NORCO Take 1-2 tablets by mouth every 4 (four) hours as needed for moderate pain.   levothyroxine 125 MCG tablet Commonly known as:  SYNTHROID, LEVOTHROID Take 250 mcg by mouth daily before breakfast.   LIVALO 2 MG Tabs Generic drug:  Pitavastatin Calcium Take 2 mg by mouth at bedtime.   losartan 50 MG tablet Commonly known as:  COZAAR Take 50 mg by mouth daily.   methocarbamol 500 MG tablet Commonly known as:  ROBAXIN Take 1 tablet (500 mg total) by mouth every 6 (six) hours as needed for muscle spasms.   multivitamin tablet Take 1 tablet by mouth every morning.   polyethylene glycol packet Commonly known as:  MIRALAX / GLYCOLAX Take 17 g by mouth 2 (two) times daily.   vitamin C 500 MG tablet Commonly known as:  ASCORBIC ACID Take 1,000 mg by mouth daily.            Durable Medical Equipment  (From admission, onward)         Start     Ordered   11/17/18 1031  For home use only DME Bedside commode  Once    Comments:  Bariatric 3n1  Question:  Patient needs a bedside commode to treat with the following condition   Answer:  Surgery, elective   11/17/18 1031   11/17/18 1030  For home use only DME Walker rolling  Once    Comments:  wide  Question:  Patient needs a walker to treat with the following condition  Answer:  Surgery, elective   11/17/18 1031         Follow-up Information    Paralee Cancel, MD. Schedule an appointment as soon as possible for a visit in 2 weeks.   Specialty:  Orthopedic Surgery Contact information: [address] [place] [address] [phone]        [name], MD. Schedule an appointment as soon as possible for a visit in 1 week(s).   Specialty:  Internal Medicine Contact information: [address] [place] [address] [phone]          Allergies  Allergen Reactions  . Dilaudid [Hydromorphone Hcl] Nausea And Vomiting    "Questionable as to whether it was actually dilaudid  That made me so severely nauseous and vomit"  . Benazepril Hcl Cough    Consultations:  Orthopedics   Procedures/Studies: Dg Pelvis Portable  Result Date: 11/11/2018 CLINICAL DATA:  57 year old male with right hip revision. EXAM: PORTABLE PELVIS 1-2 VIEWS COMPARISON:  Earlier radiograph dated 11/11/2018 FINDINGS: There are bilateral total hip arthroplasties. The arthroplasty components appear intact and in anatomic alignment. The previously seen bone fragments medial to the proximal right femur are no longer visualized. There is no acute fracture or dislocation. Postsurgical changes the soft tissues of the right hip. IMPRESSION: Bilateral total hip arthroplasties. No acute fracture or dislocation. Electronically Signed   By: Anner Crete M.D.   On: 11/11/2018 21:24   Dg Chest Portable 1 View  Result Date: 11/05/2018 CLINICAL DATA:  Preop for hip surgery. EXAM: PORTABLE CHEST 1 VIEW COMPARISON:  Radiographs of July 05, 2013. FINDINGS: Stable cardiomegaly. Both lungs are clear. The visualized skeletal structures are unremarkable. IMPRESSION: No active  disease. Electronically Signed   By: Marijo Conception, M.D.   On: 11/05/2018 19:47   Dg Hip Port Unilat With Pelvis 1v Right  Result Date: 11/11/2018 CLINICAL DATA:  Intraop right hip revision EXAM: DG HIP (WITH OR WITHOUT PELVIS) 1V PORT RIGHT COMPARISON:  Hip radiograph 11/05/2018 FINDINGS: Single portable AP radiograph demonstrates replacement of the right hip hardware. No definite displaced fracture on current exam. Suspected drain projecting over the right superior thigh soft tissues. IMPRESSION: Patient status post right hip arthroplasty. Electronically Signed   By: Lovey Newcomer M.D.   On: 11/11/2018 19:50   Dg Hip Operative Unilat W Or W/o Pelvis Right  Result Date: 11/11/2018 CLINICAL DATA:  Right hip revision EXAM: OPERATIVE RIGHT HIP (WITH PELVIS IF PERFORMED) 1 VIEWS TECHNIQUE: Fluoroscopic spot image(s) were submitted for interpretation post-operatively. COMPARISON:  11/05/2018 FINDINGS: Revision of the femoral component of the right hip replacement. Bone fragments are noted medial to the proximal femoral shaft. Exact source not visible on this single intraoperative image. IMPRESSION: Revision of the femoral component of the right hip replacement. Small bone fragments are noted medial to the proximal femoral shaft. Electronically Signed   By: Rolm Baptise M.D.   On: 11/11/2018 20:20   Dg Hip Unilat  With Pelvis 2-3 Views Right  Result Date: 11/05/2018 CLINICAL DATA:  Fall EXAM: DG HIP (WITH OR WITHOUT PELVIS) 2-3V RIGHT COMPARISON:  02/05/2016 FINDINGS: Right hip replacement. Fracture of the femoral prosthesis at the femoral neck level. Mild angulation. Acetabulum in good position. No bony fracture Left hip replacement in satisfactory position IMPRESSION: Fracture of right femoral prosthesis Electronically Signed   By: Franchot Gallo M.D.   On: 11/05/2018 18:55       Subjective: Patient seen and examined at bedside.  Feels better this morning.  Did some more physical therapy yesterday.   Is hopeful that she will improve more today and hoping to go home soon.  No overnight fever or vomiting.   Discharge Exam: Vitals:   11/16/18 2151 11/17/18 0517  BP: 126/72 116/70  Pulse: 75 65  Resp: 16 14  Temp: 98.5 F (36.9 C) 98 F (36.7 C)  SpO2: 99% 97%   Vitals:   11/16/18 0526 11/16/18 1351 11/16/18 2151 11/17/18 0517  BP: 122/80 134/63 126/72 116/70  Pulse: 74 84 75 65  Resp: 18 16 16 14   Temp: 97.6 F (36.4  C) 97.9 F (36.6 C) 98.5 F (36.9 C) 98 F (36.7 C)  TempSrc:  Oral Oral Oral  SpO2: 100% 99% 99% 97%  Weight:      Height:        General exam: no  distress Respiratory system: Bilateral decreased breath sounds at bases Cardiovascular system: S1 & S2 heard, Rate controlled Gastrointestinal system: Abdomen is nondistended, soft and nontender. Extremities: No cyanosis, edema      The results of significant diagnostics from this hospitalization (including imaging, microbiology, ancillary and laboratory) are listed below for reference.     Microbiology: Recent Results (from the past 240 hour(s))  Surgical pcr screen     Status: Abnormal   Collection Time: 11/11/18  6:34 AM  Result Value Ref Range Status   MRSA, PCR NEGATIVE NEGATIVE Final   Staphylococcus aureus POSITIVE (A) NEGATIVE Final    Comment: (NOTE) The Xpert SA Assay (FDA approved for NASAL specimens in patients 91 years of age and older), is one component of a comprehensive surveillance program. It is not intended to diagnose infection nor to guide or monitor treatment. Performed at [facility], [place] [address] [place], [place] [address]      Labs: BNP (last 3 results) No results for input(s): BNP in the last 8760 hours. Basic Metabolic Panel: Recent Labs  Lab 11/12/18 0339 11/13/18 0328 11/14/18 0526  NA 136 136 139  K 4.5 4.5 3.6  CL 103 103 101  CO2 24 27 32  GLUCOSE 177* 151* 111*  BUN 16 16 15   CREATININE 0.76 0.66 0.63  CALCIUM 8.2* 7.8* 7.8*   MG  --   --  2.2   Liver Function Tests: No results for input(s): AST, ALT, ALKPHOS, BILITOT, PROT, ALBUMIN in the last 168 hours. No results for input(s): LIPASE, AMYLASE in the last 168 hours. No results for input(s): AMMONIA in the last 168 hours. CBC: Recent Labs  Lab 11/12/18 0339 11/13/18 0328 11/14/18 0526  WBC 11.5* 12.1* 9.9  NEUTROABS  --   --  7.2  HGB 11.4* 9.5* 8.0*  HCT 33.6* 28.8* 24.4*  MCV 88.4 88.9 90.4  PLT 239 192 186   Cardiac Enzymes: No results for input(s): CKTOTAL, CKMB, CKMBINDEX, TROPONINI in the last 168 hours. BNP: Invalid input(s): POCBNP CBG: No results for input(s): GLUCAP in the last 168 hours. D-Dimer No results for input(s): DDIMER in the last 72 hours. Hgb A1c No results for input(s): HGBA1C in the last 72 hours. Lipid Profile No results for input(s): CHOL, HDL, LDLCALC, TRIG, CHOLHDL, LDLDIRECT in the last 72 hours. Thyroid function studies No results for input(s): TSH, T4TOTAL, T3FREE, THYROIDAB in the last 72 hours.  Invalid input(s): FREET3 Anemia work up No results for input(s): VITAMINB12, FOLATE, FERRITIN, TIBC, IRON, RETICCTPCT in the last 72 hours. Urinalysis    Component Value Date/Time   COLORURINE YELLOW 01/20/2016 1400   APPEARANCEUR CLEAR 01/20/2016 1400   LABSPEC 1.026 01/20/2016 1400   PHURINE 7.5 01/20/2016 1400   GLUCOSEU NEGATIVE 01/20/2016 1400   HGBUR NEGATIVE 01/20/2016 1400   HGBUR negative 01/03/2010 0918   BILIRUBINUR NEGATIVE 01/20/2016 1400   KETONESUR NEGATIVE 01/20/2016 1400   PROTEINUR NEGATIVE 01/20/2016 1400   UROBILINOGEN 0.2 01/03/2010 0918   NITRITE NEGATIVE 01/20/2016 1400   LEUKOCYTESUR NEGATIVE 01/20/2016 1400   Sepsis Labs Invalid input(s): PROCALCITONIN,  WBC,  LACTICIDVEN Microbiology Recent Results (from the past 240 hour(s))  Surgical pcr screen     Status: Abnormal   Collection  Time: 11/11/18  6:34 AM  Result Value Ref Range Status   MRSA, PCR NEGATIVE NEGATIVE Final    Staphylococcus aureus POSITIVE (A) NEGATIVE Final    Comment: (NOTE) The Xpert SA Assay (FDA approved for NASAL specimens in patients 38 years of age and older), is one component of a comprehensive surveillance program. It is not intended to diagnose infection nor to guide or monitor treatment. Performed at [facility], [place] [address], [place], [place] [address]      Time coordinating discharge: 35 minutes  SIGNED:   Aline August, MD  [facility] 11/17/2018, 2:11 PM Pager: [phone]  If 7PM-7AM, please contact night-coverage [email or web address] Password [email or web address]

## 2018-11-17 NOTE — Progress Notes (Signed)
Subjective: 6 Days Post-Op Procedure(s) (LRB): right total hip arthroplasty revision, femoral stem (Right)  Patient reports pain as mild to moderate.  Tolerating POs well.  Admits to flatus.  Denies fever, chills, N/V, CP, SOB.  Working with therapy this morning.  Reports that he struggled with ambulation yesterday.  Objective:   VITALS:  Temp:  [97.9 F (36.6 C)-98.5 F (36.9 C)] 98 F (36.7 C) (12/08 0517) Pulse Rate:  [65-84] 65 (12/08 0517) Resp:  [14-16] 14 (12/08 0517) BP: (116-134)/(63-72) 116/70 (12/08 0517) SpO2:  [97 %-99 %] 97 % (12/08 0517)  General: WDWN patient in NAD. Psych:  Appropriate mood and affect. Neuro:  A&O x 3, Moving all extremities, sensation intact to light touch HEENT:  EOMs intact Chest:  Even non-labored respirations Skin:  Dressing C/D/I, no rashes or lesions Extremities: warm/dry, mild edema, no erythema or echymosis.  No lymphadenopathy. Pulses: Popliteus 2+ MSK:  ROM: TKE.  HF to 60 degrees, MMT: able to perform quad set, (-) Homan's    LABS No results for input(s): HGB, WBC, PLT in the last 72 hours. No results for input(s): NA, K, CL, CO2, BUN, CREATININE, GLUCOSE in the last 72 hours. No results for input(s): LABPT, INR in the last 72 hours.   Assessment/Plan: 6 Days Post-Op Procedure(s) (LRB): right total hip arthroplasty revision, femoral stem (Right)  Patient seen in rounds for Dr. Leda Quail LE Up with therapy D/C home when ready per Medicine team Plan for outpatient post-op visit with Dr. Sim Boast [facility] Office:  [phone]

## 2018-11-17 NOTE — Progress Notes (Signed)
Physical Therapy Treatment Patient Details Name: Samuel Goodwin MRN: [id number] DOB: 1961-02-02 Today's Date: 11/17/2018    History of Present Illness 57 yo male admitted 11/26 for fracture of R femoral prosthesis at femoral neck. Pt s/p R THA revision on 11/11/18, PWB 50% with posterior precautions. PMH includes alcohol abuse, anxiety, OA, vertigo, depression, cancer, hep C, HLD, HTN, OSA, PTSD, L THA 2018, R THA 2017 with attempted reduction after dislocation and subsequent revision, HLD, L TKR 2015.    PT Comments    Pt very motivated and with marked improvement in activity tolerance this am.  Pt able to advance R LE with min difficulty during ambulation.   Follow Up Recommendations  Supervision for mobility/OOB;Home health PT     Equipment Recommendations  Rolling walker with 5" wheels(wide)    Recommendations for Other Services       Precautions / Restrictions Precautions Precautions: Fall;Posterior Hip Precaution Booklet Issued: Yes (comment) Restrictions Weight Bearing Restrictions: Yes RLE Weight Bearing: Partial weight bearing RLE Partial Weight Bearing Percentage or Pounds: 50%    Mobility  Bed Mobility Overal bed mobility: Needs Assistance Bed Mobility: Supine to Sit     Supine to sit: Min guard     General bed mobility comments: Pt utilizing leg lifter and single rail to manage R LE; increased time and min cues for sequence  Transfers Overall transfer level: Needs assistance Equipment used: Rolling walker (2 wheeled) Transfers: Sit to/from Stand Sit to Stand: Min guard;From elevated surface         General transfer comment: 25% VC's on proper hand placement and R LE advancement to avoid hip flex > 90  Required increased time   Ambulation/Gait Ambulation/Gait assistance: Min guard Gait Distance (Feet): 55 Feet Assistive device: Rolling walker (2 wheeled) Gait Pattern/deviations: Step-to pattern;Decreased stance time - right;Decreased weight shift to  right;Antalgic Gait velocity: decreased   General Gait Details: Increased time; pt self cueing for sequence and adherenct to THP in turns   Stairs             Wheelchair Mobility    Modified Rankin (Stroke Patients Only)       Balance Overall balance assessment: Needs assistance Sitting-balance support: No upper extremity supported Sitting balance-Leahy Scale: Good     Standing balance support: No upper extremity supported Standing balance-Leahy Scale: Fair                              Cognition Arousal/Alertness: Awake/alert Behavior During Therapy: WFL for tasks assessed/performed Overall Cognitive Status: Within Functional Limits for tasks assessed                                        Exercises Total Joint Exercises Ankle Circles/Pumps: AROM;Both;Supine;10 reps Quad Sets: AROM;10 reps;Supine;Both Short Arc Quad: AAROM;Right;15 reps;Supine Heel Slides: AAROM;Right;Supine;20 reps Hip ABduction/ADduction: PROM;Right;Supine;15 reps    General Comments        Pertinent Vitals/Pain Pain Assessment: 0-10 Pain Score: 6  Pain Location: R thigh, with mobility  Pain Descriptors / Indicators: Aching;Grimacing;Sore Pain Intervention(s): Limited activity within patient's tolerance;Monitored during session;Premedicated before session;Ice applied    Home Living                      Prior Function            PT Goals (  current goals can now be found in the care plan section) Acute Rehab PT Goals Patient Stated Goal: Regain IND PT Goal Formulation: With patient Time For Goal Achievement: 11/20/18 Potential to Achieve Goals: Good Progress towards PT goals: Progressing toward goals    Frequency    7X/week      PT Plan Discharge plan needs to be updated    Co-evaluation              AM-PAC PT "6 Clicks" Mobility   Outcome Measure  Help needed turning from your back to your side while in a flat bed without  using bedrails?: A Little Help needed moving from lying on your back to sitting on the side of a flat bed without using bedrails?: A Little Help needed moving to and from a bed to a chair (including a wheelchair)?: A Little Help needed standing up from a chair using your arms (e.g., wheelchair or bedside chair)?: A Little Help needed to walk in hospital room?: A Little Help needed climbing 3-5 steps with a railing? : A Little 6 Click Score: 18    End of Session Equipment Utilized During Treatment: Gait belt(leg lifter) Activity Tolerance: Patient limited by pain Patient left: in chair;with call bell/phone within reach Nurse Communication: Mobility status PT Visit Diagnosis: Difficulty in walking, not elsewhere classified (R26.2) Pain - Right/Left: Right Pain - part of body: Hip;Leg     Time: 3094-0768 PT Time Calculation (min) (ACUTE ONLY): 43 min  Charges:  $Gait Training: 8-22 mins $Therapeutic Exercise: 8-22 mins $Therapeutic Activity: 8-22 mins                     Debe Coder PT Acute Rehabilitation Services Pager [phone] Office [phone]    Devyon Keator 11/17/2018, 2:22 PM

## 2018-11-17 NOTE — Progress Notes (Signed)
Physical Therapy Treatment Patient Details Name: Samuel Goodwin MRN: [id number] DOB: [birth year] Today's Date: 11/17/2018    History of Present Illness 57 yo male admitted 11/26 for fracture of R femoral prosthesis at femoral neck. Pt s/p R THA revision on 11/11/18, PWB 50% with posterior precautions. PMH includes alcohol abuse, anxiety, OA, vertigo, depression, cancer, hep C, HLD, HTN, OSA, PTSD, L THA 2018, R THA 2017 with attempted reduction after dislocation and subsequent revision, HLD, L TKR 2015.    PT Comments    Pt continues motivated and progressing with mobility.   Follow Up Recommendations  Supervision for mobility/OOB;Home health PT     Equipment Recommendations  Rolling walker with 5" wheels    Recommendations for Other Services       Precautions / Restrictions Precautions Precautions: Fall;Posterior Hip Precaution Booklet Issued: Yes (comment) Precaution Comments: Pt recalls 2/3 THP without cues.  All precautions reviewed Restrictions Weight Bearing Restrictions: Yes RLE Weight Bearing: Partial weight bearing RLE Partial Weight Bearing Percentage or Pounds: 50%    Mobility  Bed Mobility Overal bed mobility: Needs Assistance Bed Mobility: Supine to Sit     Supine to sit: Min guard Sit to supine: Min assist   General bed mobility comments: cues for sequence and min assist for R LE  Transfers Overall transfer level: Needs assistance Equipment used: Rolling walker (2 wheeled) Transfers: Sit to/from Stand Sit to Stand: Min guard;From elevated surface         General transfer comment: 25% VC's on proper hand placement and R LE advancement to avoid hip flex > 90  Required increased time   Ambulation/Gait Ambulation/Gait assistance: Min guard Gait Distance (Feet): 25 Feet Assistive device: Rolling walker (2 wheeled) Gait Pattern/deviations: Step-to pattern;Decreased stance time - right;Decreased weight shift to right;Antalgic Gait velocity: decreased   General Gait Details: Increased time; pt self cueing for sequence and adherenct to THP in turns   Stairs             Wheelchair Mobility    Modified Rankin (Stroke Patients Only)       Balance Overall balance assessment: Needs assistance Sitting-balance support: No upper extremity supported Sitting balance-Leahy Scale: Good     Standing balance support: No upper extremity supported Standing balance-Leahy Scale: Fair                              Cognition Arousal/Alertness: Awake/alert Behavior During Therapy: WFL for tasks assessed/performed Overall Cognitive Status: Within Functional Limits for tasks assessed                                 General Comments: Both pt and pt's wife very anxious about mobility, about going to home       Exercises Total Joint Exercises Ankle Circles/Pumps: AROM;Both;Supine;10 reps Quad Sets: AROM;10 reps;Supine;Both Short Arc Quad: AAROM;Right;15 reps;Supine Heel Slides: AAROM;Right;Supine;20 reps Hip ABduction/ADduction: PROM;Right;Supine;15 reps    General Comments        Pertinent Vitals/Pain Pain Assessment: 0-10 Pain Score: 6  Pain Location: R thigh, with mobility  Pain Descriptors / Indicators: Aching;Grimacing;Sore Pain Intervention(s): Limited activity within patient's tolerance;Monitored during session;Premedicated before session;Ice applied    Home Living                      Prior Function  PT Goals (current goals can now be found in the care plan section) Acute Rehab PT Goals Patient Stated Goal: Regain IND PT Goal Formulation: With patient Time For Goal Achievement: 11/20/18 Potential to Achieve Goals: Good Progress towards PT goals: Progressing toward goals    Frequency    7X/week      PT Plan Discharge plan needs to be updated    Co-evaluation              AM-PAC PT "6 Clicks" Mobility   Outcome Measure  Help needed turning from your  back to your side while in a flat bed without using bedrails?: A Little Help needed moving from lying on your back to sitting on the side of a flat bed without using bedrails?: A Little Help needed moving to and from a bed to a chair (including a wheelchair)?: A Little Help needed standing up from a chair using your arms (e.g., wheelchair or bedside chair)?: A Little Help needed to walk in hospital room?: A Little Help needed climbing 3-5 steps with a railing? : A Little 6 Click Score: 18    End of Session Equipment Utilized During Treatment: Gait belt Activity Tolerance: Patient limited by pain;Patient tolerated treatment well Patient left: with call bell/phone within reach;in bed Nurse Communication: Mobility status PT Visit Diagnosis: Difficulty in walking, not elsewhere classified (R26.2) Pain - Right/Left: Right Pain - part of body: Hip;Leg     Time: 3729-0211 PT Time Calculation (min) (ACUTE ONLY): 17 min  Charges:  $Gait Training: 8-22 mins $Therapeutic Exercise: 8-22 mins $Therapeutic Activity: 8-22 mins                     [place] Pager [phone] Office [phone]    Keefer Soulliere 11/17/2018, 2:28 PM

## 2018-11-20 NOTE — Progress Notes (Signed)
Received a call from [facility] with [facility], she had been contacted by a family member of Samuel Goodwin requesting HHPT. Samuel Goodwin was d/ced on 12/8, per CM notes information had been provided to [facility] to arrange [facility]. Patient now states that he has not received any HH, he has called the ortho office and they referred him to [facility] CM. He called and left a message that [facility] has not been set up and that [facility] will need updated orders. I contacted Samuel Goodwin at [facility], she forwarded the information to the surgeon and they were to f/u with the patient. I called the patient to reassure him, he told me that he had called [employer], [facility] and the ortho office. He said he was very frustrated and thought about returning to the ED. He finally received a call from [facility] and HHPT has been arranged with [place], they were seeing him as we spoke. I contacted [place], Samuel Goodwin to make sure they were the right agency and she confirmed that were providing care.

## 2018-12-12 ENCOUNTER — Other Ambulatory Visit: Payer: Self-pay

## 2018-12-12 ENCOUNTER — Encounter (HOSPITAL_COMMUNITY): Payer: Self-pay

## 2018-12-12 ENCOUNTER — Emergency Department (HOSPITAL_COMMUNITY)
Admission: EM | Admit: 2018-12-12 | Discharge: 2018-12-12 | Disposition: A | Discharge status: Home / Self Care | Payer: No Typology Code available for payment source | Attending: Emergency Medicine | Admitting: Emergency Medicine

## 2018-12-12 ENCOUNTER — Emergency Department (HOSPITAL_COMMUNITY): Payer: No Typology Code available for payment source

## 2018-12-12 DIAGNOSIS — R0602 Shortness of breath: Secondary | ICD-10-CM | POA: Diagnosis present

## 2018-12-12 DIAGNOSIS — E039 Hypothyroidism, unspecified: Secondary | ICD-10-CM | POA: Diagnosis not present

## 2018-12-12 DIAGNOSIS — Z79899 Other long term (current) drug therapy: Secondary | ICD-10-CM | POA: Diagnosis not present

## 2018-12-12 DIAGNOSIS — I10 Essential (primary) hypertension: Secondary | ICD-10-CM | POA: Insufficient documentation

## 2018-12-12 DIAGNOSIS — Z7982 Long term (current) use of aspirin: Secondary | ICD-10-CM | POA: Insufficient documentation

## 2018-12-12 DIAGNOSIS — Z96643 Presence of artificial hip joint, bilateral: Secondary | ICD-10-CM | POA: Insufficient documentation

## 2018-12-12 DIAGNOSIS — Z96652 Presence of left artificial knee joint: Secondary | ICD-10-CM | POA: Insufficient documentation

## 2018-12-12 LAB — BASIC METABOLIC PANEL
Anion gap: 9 (ref 5–15)
BUN: 12 mg/dL (ref 6–20)
CALCIUM: 9.2 mg/dL (ref 8.9–10.3)
CO2: 26 mmol/L (ref 22–32)
Chloride: 104 mmol/L (ref 98–111)
Creatinine, Ser: 0.73 mg/dL (ref 0.61–1.24)
GFR calc Af Amer: >60 mL/min (ref >60)
GFR calc non Af Amer: >60 mL/min (ref >60)
Glucose, Bld: 97 mg/dL (ref 70–99)
Potassium: 3.8 mmol/L (ref 3.5–5.1)
Sodium: 139 mmol/L (ref 135–145)

## 2018-12-12 LAB — I-STAT TROPONIN, ED: Troponin i, poc: 0 ng/mL (ref 0.00–0.08)

## 2018-12-12 LAB — CBC
HCT: 37.3 % — ABNORMAL LOW (ref 39.0–52.0)
Hemoglobin: 12.1 g/dL — ABNORMAL LOW (ref 13.0–17.0)
MCH: 29.1 pg (ref 26.0–34.0)
MCHC: 32.4 g/dL (ref 30.0–36.0)
MCV: 89.7 fL (ref 80.0–100.0)
PLATELETS: 248 10*3/uL (ref 150–400)
RBC: 4.16 MIL/uL — ABNORMAL LOW (ref 4.22–5.81)
RDW: 13.6 % (ref 11.5–15.5)
WBC: 6.3 10*3/uL (ref 4.0–10.5)
nRBC: 0 % (ref 0.0–0.2)

## 2018-12-12 MED ORDER — BENZONATATE 100 MG PO CAPS: 100.0000 mg | ORAL_CAPSULE | Freq: Three times a day (TID) | ORAL | 0 refills | Status: DC

## 2018-12-12 MED ORDER — IOPAMIDOL (ISOVUE-370) INJECTION 76%
INTRAVENOUS | Status: AC
  Administered 2018-12-12: 100 mL
  Filled 2018-12-12: qty 100

## 2018-12-12 MED ORDER — ALBUTEROL SULFATE HFA 108 (90 BASE) MCG/ACT IN AERS
2.0000 | INHALATION_SPRAY | RESPIRATORY_TRACT | Status: DC | PRN
  Administered 2018-12-12: 2 via RESPIRATORY_TRACT
  Filled 2018-12-12: qty 6.7

## 2018-12-12 NOTE — Discharge Instructions (Addendum)
You have been evaluated for your shortness of breath today.  Fortunately your CT scan did not show any evidence of blood clot in your lung or pneumonia.  Use albuterol inhaler 2 puffs every 4 hours as needed for shortness of breath.  Take tessalon perle for cough.  Follow up closely with your doctor for further care.

## 2018-12-12 NOTE — ED Notes (Signed)
Patient verbalizes understanding of discharge instructions. Opportunity for questioning and answers were provided. Armband removed by staff, pt discharged from ED.  

## 2018-12-12 NOTE — ED Notes (Signed)
Patient transported to CT 

## 2018-12-12 NOTE — ED Provider Notes (Signed)
[facility] Provider Note   CSN: [id number] Arrival date & time: 12/12/18  1336     History   Chief Complaint Chief Complaint  Patient presents with  . Shortness of Breath  . Chest Pain    HPI Samuel Goodwin is a 58 y.o. male.  The history is provided by the patient and medical records. No language interpreter was used.  Shortness of Breath  Associated symptoms include chest pain.  Chest Pain   Associated symptoms include shortness of breath.     58 year old male with history of recurrent bronchitis, GERD, vocal cord cancer, alcohol abuse sent here from PCP office for evaluation of chest pain shortness of breath.  Patient report he had right hip surgery in November.  Throughout December he was having trouble with recurrent cough, postnasal drip, chest pressure and pleuritic chest pain.  Symptom has become progressively worse, he was seen by his PCP today and had a chest x-ray done.  Patient was recommended to come to the ER for chest CT scan.  Patient also report having throat cancer surgery several years past.  He denies any prior history of PE or DVT.  He is not a smoker.  He denies any significant cardiac disease.  He does not complain of any leg swelling or calf pain.  No hemoptysis.  No nausea vomiting or diarrhea.  Patient mention he was trying to lose weight in the earlier part of the year in order to get his knee surgery.  States that he was staying very active and to his right hip broke while he was ambulating.  Patient mention he has been very compliant with his physical therapy since surgery.  He denies history of COPD or asthma.  Past Medical History:  Diagnosis Date  . Alcohol abuse    Sobriety since 1990  . Anxiety   . Arthritis    oa  . Benign positional vertigo   . Cancer ([place]) 08/10/2016   vocal cord  . Depression   . Eardrum trauma    30 years ago/right ear  . GERD (gastroesophageal reflux disease)   . Headache    sinus  . Hepatitis C 1994   hep c - ? etiology; in [place] 1989-91; S/P interferon , Dr [name]  . History of bronchitis   . Hoarseness   . Hyperlipidemia   . Hypertension   . Hypothyroidism   . OSA (obstructive sleep apnea)    on CPAP  . PONV (postoperative nausea and vomiting)    following knee surgery last year   . PTSD (post-traumatic stress disorder)   . Vocal cord polyps     Patient Active Problem List   Diagnosis Date Noted  . Hip fracture ([place]) 11/13/2018  . Chronic anemia 11/06/2018  . Reactive airway disease 11/06/2018  . Femoral fracture ([place]) 11/05/2018  . Morbid obesity ([place]) 12/20/2016  . S/P left THA, AA 12/19/2016  . Chest pain 08/15/2016  . S/P right TH revision 02/09/2016  . Hip instability 02/05/2016  . H/O total hip arthroplasty 02/05/2016  . S/P right THA, AA 02/01/2016  . S/P knee replacement 11/27/2014  . Unspecified viral hepatitis C without hepatic coma 08/28/2014  . DYSPHONIA, CHRONIC 01/06/2010  . NONSPECIFIC ABNORMAL ELECTROCARDIOGRAM 01/06/2010  . HOARSENESS, CHRONIC 05/31/2009  . Hypothyroidism 04/09/2009  . HYPERLIPIDEMIA 04/09/2009  . HTN (hypertension) 04/09/2009  . GERD (gastroesophageal reflux disease) 04/09/2009  . Sleep apnea 04/09/2009    Past Surgical History:  Procedure Laterality Date  .  ANTERIOR HIP REVISION Right 02/07/2016   Procedure: ANTERIOR HIP REVISION;  Surgeon: Paralee Cancel, MD;  Location: [facility];  Service: Orthopedics;  Laterality: Right;  . CARDIOVASCULAR STRESS TEST  02/09/2010   No scintigraphic evidence of inducible ischemia.  [name] HIP CLOSED REDUCTION Right 02/05/2016   Procedure: CLOSED MANIPULATION HIP;  Surgeon: Latanya Maudlin, MD;  Location: [facility];  Service: Orthopedics;  Laterality: Right;  . knee arthroscopic Bilateral   . LARYNGOSCOPY  08/2009   Dr.[name]  . right knee arthroscopic knee surgery  12 yrs ago   dr Theda Sers  . THROAT SURGERY  aug, sept, Nov 16 2016   vocal cord  laser sugery X 3, Dr [name] , [facility]    . TOTAL HIP ARTHROPLASTY Right 02/01/2016   Procedure: RIGHT TOTAL HIP ARTHROPLASTY ANTERIOR APPROACH;  Surgeon: Paralee Cancel, MD;  Location: [facility];  Service: Orthopedics;  Laterality: Right;  . TOTAL HIP ARTHROPLASTY Left 12/19/2016   Procedure: LEFT TOTAL HIP ARTHROPLASTY ANTERIOR APPROACH;  Surgeon: Paralee Cancel, MD;  Location: [facility];  Service: Orthopedics;  Laterality: Left;  . TOTAL HIP REVISION Right 11/11/2018   Procedure: right total hip arthroplasty revision, femoral stem;  Surgeon: Paralee Cancel, MD;  Location: [facility];  Service: Orthopedics;  Laterality: Right;  45min  . TOTAL KNEE ARTHROPLASTY Left 11/27/2014   dr [name]  . TOTAL KNEE ARTHROPLASTY Left 11/27/2014   Procedure: LEFT TOTAL KNEE ARTHROPLASTY;  Surgeon: Augustin Schooling, MD;  Location: [place];  Service: Orthopedics;  Laterality: Left;  . TRANSTHORACIC ECHOCARDIOGRAM  11/08/2005   EF 68%, normal LV systolic function  . UPPER GASTROINTESTINAL ENDOSCOPY  2010   Negative, Dr.[name]        Home Medications    Prior to Admission medications   Medication Sig Start Date End Date Taking? Authorizing Provider  albuterol (PROVENTIL HFA;VENTOLIN HFA) 108 (90 Base) MCG/ACT inhaler Inhale 2 puffs into the lungs every 6 (six) hours as needed for wheezing or shortness of breath.    [provider]  aspirin (ASPIRIN CHILDRENS) 81 MG chewable tablet Chew 1 tablet (81 mg total) by mouth 2 (two) times daily. Take for 4 weeks, then resume regular dose. 11/14/18 12/14/18  Danae Orleans, PA-C  b complex vitamins tablet Take 2 tablets by mouth daily.     [provider]  bisacodyl (DULCOLAX) 10 MG suppository Place 1 suppository (10 mg total) rectally daily as needed for moderate constipation. 11/16/18   Susa Day, MD  docusate sodium (COLACE) 100 MG capsule Take 1 capsule (100 mg total) by mouth 2 (two) times daily. 11/13/18   Danae Orleans, PA-C  ferrous sulfate (FERROUSUL) 325 (65 FE) MG tablet Take 1 tablet (325 mg  total) by mouth 3 (three) times daily with meals. 11/13/18   Danae Orleans, PA-C  fluticasone (FLONASE) 50 MCG/ACT nasal spray Place 1 spray into both nostrils daily.  10/31/18   [provider]  hydrochlorothiazide (HYDRODIURIL) 25 MG tablet Take 1 tablet (25 mg total) by mouth daily. 02/01/12   Hendricks Limes, MD  HYDROcodone-acetaminophen (NORCO) 7.5-325 MG tablet Take 1-2 tablets by mouth every 4 (four) hours as needed for moderate pain. 11/13/18   Danae Orleans, PA-C  levothyroxine (SYNTHROID, LEVOTHROID) 125 MCG tablet Take 250 mcg by mouth daily before breakfast.    [provider]  losartan (COZAAR) 50 MG tablet Take 50 mg by mouth daily.    [provider]  methocarbamol (ROBAXIN) 500 MG tablet Take 1 tablet (500 mg total) by mouth  every 6 (six) hours as needed for muscle spasms. 11/13/18   Danae Orleans, PA-C  Multiple Vitamin (MULTIVITAMIN) tablet Take 1 tablet by mouth every morning.     [provider]  Pitavastatin Calcium (LIVALO) 2 MG TABS Take 2 mg by mouth at bedtime.    [provider]  polyethylene glycol (MIRALAX / GLYCOLAX) packet Take 17 g by mouth 2 (two) times daily. 11/13/18   Danae Orleans, PA-C  vitamin C (ASCORBIC ACID) 500 MG tablet Take 1,000 mg by mouth daily.    [provider]    Family History Family History  Problem Relation Age of Onset  . Cirrhosis Father   . Diabetes Father   . Stroke Father   . Heart failure Mother   . Hypertension Mother   . Thyroid disease Mother   . Heart attack Brother 37  . Aneurysm Maternal Aunt         AAA  . Coronary artery disease Maternal Uncle        MI late 68s    Social History Social History   Tobacco Use  . Smoking status: Never Smoker  . Smokeless tobacco: Never Used  . Tobacco comment: Quit at age 48  Substance Use Topics  . 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 Drug use: No     Allergies   Dilaudid [hydromorphone hcl]  and Benazepril hcl   Review of Systems Review of Systems  Respiratory: Positive for shortness of breath.   Cardiovascular: Positive for chest pain.  All other systems reviewed and are negative.    Physical Exam Updated Vital Signs BP (!) 143/100 (BP Location: Right Arm)   Pulse 68   Temp 98.5 F (36.9 C) (Oral)   Resp 18   SpO2 99%   Physical Exam Vitals signs and nursing note reviewed.  Constitutional:      General: He is not in acute distress.    Appearance: He is well-developed.  HENT:     Head: Atraumatic.  Eyes:     Conjunctiva/sclera: Conjunctivae normal.  Neck:     Musculoskeletal: Neck supple.  Cardiovascular:     Rate and Rhythm: Normal rate and regular rhythm.  Pulmonary:     Effort: Pulmonary effort is normal.     Breath sounds: Normal breath sounds. No decreased breath sounds, wheezing, rhonchi or rales.  Chest:     Chest wall: No tenderness.  Abdominal:     Palpations: Abdomen is soft.     Tenderness: There is no abdominal tenderness.  Musculoskeletal:     Right lower leg: No edema.     Left lower leg: No edema.  Skin:    Findings: No rash.  Neurological:     Mental Status: He is alert.      ED Treatments / Results  Labs (all labs ordered are listed, but only abnormal results are displayed) Labs Reviewed  CBC - Abnormal; Notable for the following components:      Result Value   RBC 4.16 (*)    Hemoglobin 12.1 (*)    HCT 37.3 (*)    All other components within normal limits  BASIC METABOLIC PANEL  I-STAT TROPONIN, ED    EKG None  ED ECG REPORT   Date: 12/12/2018  Rate: 74  Rhythm: normal sinus rhythm and premature ventricular contractions (PVC)  QRS Axis: normal  Intervals: QT prolonged  ST/T Wave abnormalities: normal  Conduction Disutrbances:none  Narrative Interpretation:  Old EKG Reviewed: unchanged  I have personally reviewed the EKG tracing and agree with the computerized printout as noted.   Radiology Ct Angio  Chest Pe W And/or Wo Contrast  Result Date: 12/12/2018 CLINICAL DATA:  Worsening chest pain and shortness of breath for the past 3 weeks. Recent hip replacement. EXAM: CT ANGIOGRAPHY CHEST WITH CONTRAST TECHNIQUE: Multidetector CT imaging of the chest was performed using the standard protocol during bolus administration of intravenous contrast. Multiplanar CT image reconstructions and MIPs were obtained to evaluate the vascular anatomy. CONTRAST:  164mL ISOVUE-370 IOPAMIDOL (ISOVUE-370) INJECTION 76% COMPARISON:  None. FINDINGS: Cardiovascular: Satisfactory opacification of the pulmonary arteries to the segmental level. No evidence of pulmonary embolism. Stable mild cardiomegaly. No pericardial effusion. No thoracic aortic aneurysm or dissection. Mediastinum/Nodes: No enlarged mediastinal, hilar, or axillary lymph nodes. Thyroid gland, trachea, and esophagus demonstrate no significant findings. Lungs/Pleura: No suspicious pulmonary nodule. No focal consolidation, pleural effusion, or pneumothorax. Upper Abdomen: No acute abnormality. Musculoskeletal: No chest wall abnormality. No acute or significant osseous findings. Review of the MIP images confirms the above findings. IMPRESSION: 1. No evidence of pulmonary embolism. No acute intrathoracic process. Electronically Signed   By: Titus Dubin M.D.   On: 12/12/2018 21:01    Procedures Procedures (including critical care time)  Medications Ordered in ED Medications  albuterol (PROVENTIL HFA;VENTOLIN HFA) 108 (90 Base) MCG/ACT inhaler 2 puff (has no administration in time range)  iopamidol (ISOVUE-370) 76 % injection (100 mLs  Contrast Given 12/12/18 2029)     Initial Impression / Assessment and Plan / ED Course  I have reviewed the triage vital signs and the nursing notes.  Pertinent labs & imaging results that were available during my care of the patient were reviewed by me and considered in my medical decision making (see chart for details).      BP (!) 152/119   Pulse 75   Temp 98.1 F (36.7 C) (Oral)   Resp 17   Ht 6' (1.829 m)   Wt 128.8 kg   SpO2 98%   BMI 38.52 kg/m    Final Clinical Impressions(s) / ED Diagnoses   Final diagnoses:  Shortness of breath    ED Discharge Orders         Ordered    benzonatate (TESSALON) 100 MG capsule  Every 8 hours     12/12/18 2133         7:10 PM Patient here with recurrent cough, chest discomfort and shortness of breath after having right hip surgery more than a month ago.  He is at risk for potential PE.  He has had a negative chest x-ray today by PCP.  Will order chest CT angiogram for further care.    9:27 PM EKG shows mild prolonged QT, evidence of sinus rhythm with occasional premature ventricular complex.  Troponin is negative, labs otherwise reassuring, no electrolytes abnormalities, normal WBC, hemoglobin is 12.1.  Chest CT angiogram showed no evidence of PE and no acute intrathoracic process.  Given that his symptoms been ongoing for the past month in terms of a cough and chest discomfort, I have low suspicion for ACS causing his symptoms.  Patient discharged home with cough medication and outpatient follow-up with PCP for further care.   Domenic Moras, PA-C 12/12/18 2135    Lennice Sites, DO 12/12/18 2341

## 2018-12-12 NOTE — ED Triage Notes (Signed)
Pt reports continued chest pain and SOB for the past month. Pt also having cough, congestion. Pt was seen at PCP today and sent here for CT scan. Xray done at [place] today

## 2019-09-12 ENCOUNTER — Ambulatory Visit: Payer: Managed Care, Other (non HMO) | Admitting: Cardiovascular Disease

## 2019-10-24 ENCOUNTER — Encounter: Payer: Self-pay | Admitting: Cardiovascular Disease

## 2019-10-24 ENCOUNTER — Ambulatory Visit: Payer: Managed Care, Other (non HMO) | Admitting: Cardiovascular Disease

## 2019-10-24 ENCOUNTER — Other Ambulatory Visit: Payer: Self-pay

## 2019-10-24 VITALS — BP 126/88 | HR 66 | Ht 72.0 in | Wt 292.2 lb

## 2019-10-24 DIAGNOSIS — R0789 Other chest pain: Secondary | ICD-10-CM | POA: Diagnosis not present

## 2019-10-24 DIAGNOSIS — I10 Essential (primary) hypertension: Secondary | ICD-10-CM

## 2019-10-24 DIAGNOSIS — G4730 Sleep apnea, unspecified: Secondary | ICD-10-CM

## 2019-10-24 NOTE — Assessment & Plan Note (Signed)
History of essential hypertension with blood pressure measured today at 126/88.  He is on hydrochlorothiazide and losartan.

## 2019-10-24 NOTE — Progress Notes (Signed)
10/24/2019 Samuel Goodwin   [birth year]  [id number]  Primary Physician Samuel Hatchet, MD Primary Cardiologist: Samuel Harp MD Samuel Goodwin, [place], [place]  HPI:  Samuel Goodwin is a 58 y.o.  moderately overweight, married Caucasian male, father of 3 who I last saw  08/15/2016.  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  He has a strong family history for heart disease along with hypertension and hyperlipidemia. He has obstructive sleep apnea on CPAP which he benefits from.[name] He is trying to lose weight with marginal success. 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 He also has had throat cancer surgically addressed at [facility]. He was complaining of atypical chest pain  in the past and  had a negative Myoview stress test performed 08/29/2016.  He unfortunately rebroke his right hip late last year and had a long recovery.  He was unable to exercise unfortunately has gained weight as a result of that.  He also needs a right total knee replacement scheduled to be performed after the first the year by Dr. Alvan Goodwin .  He denies chest pain or shortness of breath.   Current Meds  Medication Sig  . b complex vitamins tablet Take 2 tablets by mouth daily.   . celecoxib (CELEBREX) 200 MG capsule Take 1 capsule by mouth daily.  . diclofenac Sodium (VOLTAREN) 1 % GEL Apply 1 application topically daily.  . ferrous sulfate (FERROUSUL) 325 (65 FE) MG tablet Take 1 tablet (325 mg total) by mouth 3 (three) times daily with meals.  . fluticasone (FLONASE) 50 MCG/ACT nasal spray Place 1 spray into both nostrils daily.   . hydrochlorothiazide (HYDRODIURIL) 25 MG tablet Take 1 tablet (25 mg total) by mouth daily.  [name] levothyroxine (SYNTHROID, LEVOTHROID) 125 MCG tablet Take 250 mcg by mouth daily before breakfast.  . losartan (COZAAR) 50 MG tablet Take 50 mg by mouth daily.  . Multiple Vitamin (MULTIVITAMIN)  tablet Take 1 tablet by mouth every morning.   . Pitavastatin Calcium (LIVALO) 2 MG TABS Take 2 mg by mouth at bedtime.  . vitamin C (ASCORBIC ACID) 500 MG tablet Take 1,000 mg by mouth daily.     Allergies  Allergen Reactions  . Dilaudid [Hydromorphone Hcl] Nausea And Vomiting    "Questionable as to whether it was actually dilaudid  That made me so severely nauseous and vomit"  . Benazepril Hcl Cough    Social History   Socioeconomic History  . Marital status: Married    Spouse name: Not on file  . Number of children: Not on file  . Years of education: Not on file  . Highest education level: Not on file  Occupational History  . Not on file  Social Needs  . Financial resource strain: Not on file  . Food insecurity    Worry: Not on file    Inability: Not on file  . Transportation needs    Medical: Not on file    Non-medical: Not on file  Tobacco Use  . Smoking status: Never Smoker  . Smokeless tobacco: Never Used  . Tobacco comment: Quit at age 16  Substance and Sexual Activity  . 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 Drug use: No  . Sexual activity: Yes  Lifestyle  . Physical activity    Days per week: Not on file  Minutes per session: Not on file  . Stress: Not on file  Relationships  . Social Herbalist on phone: Not on file    Gets together: Not on file    Attends religious service: Not on file    Active member of club or organization: Not on file    Attends meetings of clubs or organizations: Not on file    Relationship status: Not on file  . Intimate partner violence    Fear of current or ex partner: Not on file    Emotionally abused: Not on file    Physically abused: Not on file    Forced sexual activity: Not on file  Other Topics Concern  . Not on file  Social History Narrative  . Not on file     Review of Systems: General: negative for chills, fever, night sweats or weight changes.  Cardiovascular: negative  for chest pain, dyspnea on exertion, edema, orthopnea, palpitations, paroxysmal nocturnal dyspnea or shortness of breath Dermatological: negative for rash Respiratory: negative for cough or wheezing Urologic: negative for hematuria Abdominal: negative for nausea, vomiting, diarrhea, bright red blood per rectum, melena, or hematemesis Neurologic: negative for visual changes, syncope, or dizziness All other systems reviewed and are otherwise negative except as noted above.    Blood pressure 126/88, pulse 66, height 6' (1.829 m), weight 292 lb 3.2 oz (132.5 kg).  General appearance: alert and no distress Neck: no adenopathy, no carotid bruit, no JVD, supple, symmetrical, trachea midline and thyroid not enlarged, symmetric, no tenderness/mass/nodules Lungs: clear to auscultation bilaterally Heart: regular rate and rhythm, S1, S2 normal, no murmur, click, rub or gallop Extremities: extremities normal, atraumatic, no cyanosis or edema Pulses: 2+ and symmetric Skin: Skin color, texture, turgor normal. No rashes or lesions Neurologic: Alert and oriented X 3, normal strength and tone. Normal symmetric reflexes. Normal coordination and gait  EKG normal sinus rhythm at 66 without ST or T wave changes.I   Personally reviewed this EKG.  ASSESSMENT AND PLAN:   HYPERLIPIDEMIA History of hyperlipidemia on Livalo.  We will recheck a lipid liver profile  HTN (hypertension) History of essential hypertension with blood pressure measured today at 126/88.  He is on hydrochlorothiazide and losartan.  Sleep apnea History of obstructive sleep apnea on CPAP which he benefits from.  Morbid obesity ([place]) History of obesity with a BMI of close to 40.  He is aware of this but has difficulty exercising because of all of his orthopedic issues.      Samuel Harp MD FACP,FACC,FAHA, FSCAI 10/24/2019 9:00 AM

## 2019-10-24 NOTE — Assessment & Plan Note (Signed)
History of obesity with a BMI of close to 40.  He is aware of this but has difficulty exercising because of all of his orthopedic issues.

## 2019-10-24 NOTE — Assessment & Plan Note (Signed)
History of hyperlipidemia on Livalo.  We will recheck a lipid liver profile

## 2019-10-24 NOTE — Patient Instructions (Signed)
Medication Instructions:  Your physician recommends that you continue on your current medications as directed. Please refer to the Current Medication list given to you today.  If you need a refill on your cardiac medications before your next appointment, please call your pharmacy.   Lab work: Fasting Lipid and Hepatic Function in [place] If you have labs (blood work) drawn today and your tests are completely normal, you will receive your results only by: MyChart Message (if you have MyChart) OR A paper copy in the mail If you have any lab test that is abnormal or we need to change your treatment, we will call you to review the results.  Testing/Procedures: NONE  Follow-Up: At [facility], you and your health needs are our priority.  As part of our continuing mission to provide you with exceptional heart care, we have created designated Provider Care Teams.  These Care Teams include your primary Cardiologist (physician) and Advanced Practice Providers (APPs -  Physician Assistants and Nurse Practitioners) who all work together to provide you with the care you need, when you need it. You may see Dr. Gwenlyn Found or one of the following Advanced Practice Providers on your designated Care Team:    Kerin Ransom, PA-C  [place], [place]  Coletta Memos, [place]  Your physician wants you to follow-up in: 1 year. You will receive a reminder letter in the mail two months in advance. If you don't receive a letter, please call our office to schedule the follow-up appointment.

## 2019-10-24 NOTE — Assessment & Plan Note (Signed)
History of obstructive sleep apnea on CPAP which he benefits from 

## 2019-10-27 ENCOUNTER — Telehealth: Payer: Self-pay | Admitting: *Deleted

## 2019-10-27 NOTE — Telephone Encounter (Signed)
   [facility] Pre-operative Risk Assessment    Request for surgical clearance:  1. What type of surgery is being performed? Right total knee arthoplasty  2. When is this surgery scheduled? 12-23-2019   3. What type of clearance is required (medical clearance vs. Pharmacy clearance to hold med vs. Both)? medical  4. Are there any medications that need to be held prior to surgery and how long?none  5. Practice name and name of physician performing surgery? Dr Paralee Cancel   6. What is your office phone number [phone]    7.   What is your office fax number [phone] [email or web address] [email or web address]  8.   Anesthesia type (None, local, MAC, general) ? spinal   Samuel Goodwin 10/27/2019, 1:32 PM  _________________________________________________________________   (provider comments below)

## 2019-10-27 NOTE — Telephone Encounter (Signed)
   Primary Cardiologist: Quay Burow, MD  Chart reviewed as part of pre-operative protocol coverage. Patient has no known cardiac history but does have family history of CAD and multiple risk factors including hypertension, hyperlipidemia, and obesity. He was recently seen by Dr Gwenlyn Found on 10/24/2019 and denied any chest pain or shortness of breath. Dr. Gwenlyn Found aware of need for right knee replacement and did not mention anything in his note about need for additional cardiac testing. Given past medical history and time since last visit, based on ACC/AHA guidelines, TUAN DELSIGNORE would be at acceptable risk for the planned procedure without further cardiovascular testing.   I will route this recommendation to the requesting party via Epic fax function and remove from pre-op pool.  Please call with questions.  Darreld Mclean, PA-C 10/27/2019, 1:43 PM

## 2019-12-02 ENCOUNTER — Encounter (HOSPITAL_COMMUNITY): Payer: Self-pay

## 2019-12-02 NOTE — Patient Instructions (Addendum)
DUE TO COVID-19 ONLY ONE VISITOR IS ALLOWED TO COME WITH YOU AND STAY IN THE WAITING ROOM ONLY DURING PRE OP AND PROCEDURE. THE ONE VISITOR MAY VISIT WITH YOU IN YOUR PRIVATE ROOM DURING VISITING HOURS ONLY!!   COVID SWAB TESTING MUST BE COMPLETED ON:  Saturday, Dec 13, 2019 at  9:25 AM [address], [facility] [facility] pre [facility] (Must self quarantine after testing. Follow instructions on handout.)             Your procedure is scheduled on: Tuesday, Dec 16, 2019   Report to [facility] Main  Entrance   Report to Short Stay at 5:30 AM   Call this number if you have problems the morning of surgery [phone]   BRING CPAP MASK AND TUBING DAY OF SURGERY   Do not eat food:After Midnight.   May have liquids until 4:15 AM the morning of surgery   CLEAR LIQUID DIET  Foods Allowed                                                                     Foods Excluded  Water, Black Coffee and tea, regular and decaf                             liquids that you cannot  Plain Jell-O in any flavor  (No red)                                           see through such as: Fruit ices (not with fruit pulp)                                     milk, soups, orange juice  Iced Popsicles (No red)                                    All solid food Carbonated beverages, regular and diet                                    Apple juices Sports drinks like Gatorade (No red) Lightly seasoned clear broth or consume(fat free) Sugar, honey syrup  Sample Menu Breakfast                                Lunch                                     Supper Cranberry juice                    Beef broth  Chicken broth Jell-O                                     Grape juice                           Apple juice Coffee or tea                        Jell-O                                      Popsicle                                                 Coffee or tea                        Coffee or tea   Complete one Ensure drink the morning of surgery at 4:15 AM the day of surgery.   Brush your teeth the morning of surgery.   Do NOT smoke after Midnight   Take these medicines the morning of surgery with A SIP OF WATER: Levothyroxine   May use Flonase the morning of surgery                               You may not have any metal on your body including jewelry, and body piercings             Do not wear lotions, powders, perfumes/cologne, or deodorant                           Men may shave face and neck.   Do not bring valuables to the hospital. [name]   Contacts, dentures or bridgework may not be worn into surgery.   Bring small overnight bag day of surgery.    Patients discharged the day of surgery will not be allowed to drive home.   Special Instructions: Bring a copy of your healthcare power of attorney and living will documents         the day of surgery if you haven't scanned them in before.              Please read over the following fact sheets you were given:  [facility] - Preparing for Surgery Before surgery, you can play an important role.  Because skin is not sterile, your skin needs to be as free of germs as possible.  You can reduce the number of germs on your skin by washing with CHG (chlorahexidine gluconate) soap before surgery.  CHG is an antiseptic cleaner which kills germs and bonds with the skin to continue killing germs even after washing. Please DO NOT use if you have an allergy to CHG or antibacterial soaps.  If your skin becomes reddened/irritated stop using the CHG and inform your nurse when you arrive at Short Stay. Do not shave (  including legs and underarms) for at least 48 hours prior to the first CHG shower.  You may shave your face/neck.  Please follow these instructions carefully:  1.  Shower with CHG Soap the night before surgery and the   morning of surgery.  2.  If you choose to wash your hair, wash your hair first as usual with your normal  shampoo.  3.  After you shampoo, rinse your hair and body thoroughly to remove the shampoo.                             4.  Use CHG as you would any other liquid soap.  You can apply chg directly to the skin and wash.  Gently with a scrungie or clean washcloth.  5.  Apply the CHG Soap to your body ONLY FROM THE NECK DOWN.   Do   not use on face/ open                           Wound or open sores. Avoid contact with eyes, ears mouth and   genitals (private parts).                       Wash face,  Genitals (private parts) with your normal soap.             6.  Wash thoroughly, paying special attention to the area where your    surgery  will be performed.  7.  Thoroughly rinse your body with warm water from the neck down.  8.  DO NOT shower/wash with your normal soap after using and rinsing off the CHG Soap.                9.  Pat yourself dry with a clean towel.            10.  Wear clean pajamas.            11.  Place clean sheets on your bed the night of your first shower and do not  sleep with pets. Day of Surgery : Do not apply any lotions/deodorants the morning of surgery.  Please wear clean clothes to the hospital/surgery center.  FAILURE TO FOLLOW THESE INSTRUCTIONS MAY RESULT IN THE CANCELLATION OF YOUR SURGERY  PATIENT SIGNATURE_________________________________  NURSE SIGNATURE__________________________________  ________________________________________________________________________   Samuel Goodwin  An incentive spirometer is a tool that can help keep your lungs clear and active. This tool measures how well you are filling your lungs with each breath. Taking long deep breaths may help reverse or decrease the chance of developing breathing (pulmonary) problems (especially infection) following:  A long period of time when you are unable to move or be active. BEFORE THE  PROCEDURE   If the spirometer includes an indicator to show your best effort, your nurse or respiratory therapist will set it to a desired goal.  If possible, sit up straight or lean slightly forward. Try not to slouch.  Hold the incentive spirometer in an upright position. INSTRUCTIONS FOR USE  1. Sit on the edge of your bed if possible, or sit up as far as you can in bed or on a chair. 2. Hold the incentive spirometer in an upright position. 3. Breathe out normally. 4. Place the mouthpiece in your mouth and seal your lips tightly around it. 5. Breathe in slowly and as deeply  as possible, raising the piston or the ball toward the top of the column. 6. Hold your breath for 3-5 seconds or for as long as possible. Allow the piston or ball to fall to the bottom of the column. 7. Remove the mouthpiece from your mouth and breathe out normally. 8. Rest for a few seconds and repeat Steps 1 through 7 at least 10 times every 1-2 hours when you are awake. Take your time and take a few normal breaths between deep breaths. 9. The spirometer may include an indicator to show your best effort. Use the indicator as a goal to work toward during each repetition. 10. After each set of 10 deep breaths, practice coughing to be sure your lungs are clear. If you have an incision (the cut made at the time of surgery), support your incision when coughing by placing a pillow or rolled up towels firmly against it. Once you are able to get out of bed, walk around indoors and cough well. You may stop using the incentive spirometer when instructed by your caregiver.  RISKS AND COMPLICATIONS  Take your time so you do not get dizzy or light-headed.  If you are in pain, you may need to take or ask for pain medication before doing incentive spirometry. It is harder to take a deep breath if you are having pain. AFTER USE  Rest and breathe slowly and easily.  It can be helpful to keep track of a log of your progress. Your  caregiver can provide you with a simple table to help with this. If you are using the spirometer at home, follow these instructions: [employer] IF:   You are having difficultly using the spirometer.  You have trouble using the spirometer as often as instructed.  Your pain medication is not giving enough relief while using the spirometer.  You develop fever of 100.5 F (38.1 C) or higher. SEEK IMMEDIATE MEDICAL CARE IF:   You cough up bloody sputum that had not been present before.  You develop fever of 102 F (38.9 C) or greater.  You develop worsening pain at or near the incision site. MAKE SURE YOU:   Understand these instructions.  Will watch your condition.  Will get help right away if you are not doing well or get worse. Document Released: 04/09/2007 Document Revised: 02/19/2012 Document Reviewed: 06/10/2007 ExitCare Patient Information 2014 ExitCare, [place].   ________________________________________________________________________  WHAT IS A BLOOD TRANSFUSION? Blood Transfusion Information  A transfusion is the replacement of blood or some of its parts. Blood is made up of multiple cells which provide different functions.  Red blood cells carry oxygen and are used for blood loss replacement.  White blood cells fight against infection.  Platelets control bleeding.  Plasma helps clot blood.  Other blood products are available for specialized needs, such as hemophilia or other clotting disorders. BEFORE THE TRANSFUSION  Who gives blood for transfusions?   Healthy volunteers who are fully evaluated to make sure their blood is safe. This is blood bank blood. Transfusion therapy is the safest it has ever been in the practice of medicine. Before blood is taken from a donor, a complete history is taken to make sure that person has no history of diseases nor engages in risky social behavior (examples are intravenous drug use or sexual activity with multiple  partners). The donor's travel history is screened to minimize risk of transmitting infections, such as malaria. The donated blood is tested for signs of infectious diseases,  such as HIV and hepatitis. The blood is then tested to be sure it is compatible with you in order to minimize the chance of a transfusion reaction. If you or a relative donates blood, this is often done in anticipation of surgery and is not appropriate for emergency situations. It takes many days to process the donated blood. RISKS AND COMPLICATIONS Although transfusion therapy is very safe and saves many lives, the main dangers of transfusion include:   Getting an infectious disease.  Developing a transfusion reaction. This is an allergic reaction to something in the blood you were given. Every precaution is taken to prevent this. The decision to have a blood transfusion has been considered carefully by your caregiver before blood is given. Blood is not given unless the benefits outweigh the risks. AFTER THE TRANSFUSION  Right after receiving a blood transfusion, you will usually feel much better and more energetic. This is especially true if your red blood cells have gotten low (anemic). The transfusion raises the level of the red blood cells which carry oxygen, and this usually causes an energy increase.  The nurse administering the transfusion will monitor you carefully for complications. HOME CARE INSTRUCTIONS  No special instructions are needed after a transfusion. You may find your energy is better. Speak with your caregiver about any limitations on activity for underlying diseases you may have. SEEK MEDICAL CARE IF:   Your condition is not improving after your transfusion.  You develop redness or irritation at the intravenous (IV) site. SEEK IMMEDIATE MEDICAL CARE IF:  Any of the following symptoms occur over the next 12 hours:  Shaking chills.  You have a temperature by mouth above 102 F (38.9 C), not  controlled by medicine.  Chest, back, or muscle pain.  People around you feel you are not acting correctly or are confused.  Shortness of breath or difficulty breathing.  Dizziness and fainting.  You get a rash or develop hives.  You have a decrease in urine output.  Your urine turns a dark color or changes to pink, red, or brown. Any of the following symptoms occur over the next 10 days:  You have a temperature by mouth above 102 F (38.9 C), not controlled by medicine.  Shortness of breath.  Weakness after normal activity.  The white part of the eye turns yellow (jaundice).  You have a decrease in the amount of urine or are urinating less often.  Your urine turns a dark color or changes to pink, red, or brown. Document Released: 11/24/2000 Document Revised: 02/19/2012 Document Reviewed: 07/13/2008 [facility] Patient Information 2014 [place], [place].  _______________________________________________________________________

## 2019-12-03 ENCOUNTER — Encounter (HOSPITAL_COMMUNITY)
Admission: RE | Admit: 2019-12-03 | Discharge: 2019-12-03 | Disposition: A | Discharge status: Home / Self Care | Payer: Managed Care, Other (non HMO) | Source: Ambulatory Visit | Attending: Orthopedic Surgery | Admitting: Orthopedic Surgery

## 2019-12-03 ENCOUNTER — Encounter (HOSPITAL_COMMUNITY): Payer: Self-pay

## 2019-12-03 ENCOUNTER — Other Ambulatory Visit: Payer: Self-pay

## 2019-12-03 DIAGNOSIS — Z01818 Encounter for other preprocedural examination: Secondary | ICD-10-CM | POA: Diagnosis not present

## 2019-12-03 HISTORY — DX: Obesity, unspecified: E66.9

## 2019-12-03 HISTORY — DX: Personal history of diseases of the blood and blood-forming organs and certain disorders involving the immune mechanism: Z86.2

## 2019-12-03 NOTE — Progress Notes (Signed)
PCP - Dr. Hoover Brunette Cardiologist - Dr. Adora Fridge Last office visit 10/24/2019, clearance in telephone encounter 10/27/2019 in epic  Chest x-ray - 01/2019 at Dr. Hoover Brunette office EKG - 10/24/2019 in epic Stress Test - greater than 2 years ago ECHO - greater than 2 years ago Cardiac Cath - N/A  Sleep Study - 08/09/2011 in epic CPAP - Yes  Fasting Blood Sugar - N/A Checks Blood Sugar _ N/A____ times a day  Blood Thinner Instructions:   N/A Aspirin Instructions: Yes Last Dose: 11/25/2019  Anesthesia review: OSA  Patient denies shortness of breath, fever, cough and chest pain at PAT appointment   Patient verbalized understanding of instructions that were given to them at the PAT appointment. Patient was also instructed that they will need to review over the PAT instructions again at home before surgery.

## 2019-12-08 ENCOUNTER — Encounter (HOSPITAL_COMMUNITY): Payer: Managed Care, Other (non HMO)

## 2019-12-08 NOTE — H&P (Signed)
TOTAL KNEE ADMISSION H&P  Patient is being admitted for right total knee arthroplasty.  Subjective:  Chief Complaint:   Right knee primary OA / pain  HPI: Samuel Goodwin, 58 y.o. male, has a history of pain and functional disability in the right knee due to arthritis and has failed non-surgical conservative treatments for greater than 12 weeks to include NSAID's and/or analgesics, corticosteriod injections, use of assistive devices and activity modification.  Onset of symptoms was gradual, starting  years ago with gradually worsening course since that time. The patient noted prior procedures on the knee to include  arthroscopy and menisectomy on the right knee(s).  Patient currently rates pain in the right knee(s) at 7 out of 10 with activity. Patient has night pain, worsening of pain with activity and weight bearing, pain that interferes with activities of daily living, pain with passive range of motion, crepitus and joint swelling.  Patient has evidence of periarticular osteophytes and joint space narrowing by imaging studies.  There is no active infection.  Risks, benefits and expectations were discussed with the patient.  Risks including but not limited to the risk of anesthesia, blood clots, nerve damage, blood vessel damage, failure of the prosthesis, infection and up to and including death.  Patient understand the risks, benefits and expectations and wishes to proceed with surgery.   PCP: [name], MD  D/C Plans:       Home   Post-op Meds:       No Rx given   Tranexamic Acid:      To be given - IV   Decadron:      Is to be given  FYI:      ASA  Norco  DME:   Pt already has equipment  PT:   OPPT @ [facility]  Pharmacy: [facility] [place] [facility]   Patient Active Problem List   Diagnosis Date Noted  . Hip fracture ([place]) 11/13/2018  . Chronic anemia 11/06/2018  . Reactive airway disease 11/06/2018  . Femoral fracture ([place]) 11/05/2018  . Morbid obesity ([place]) 12/20/2016  .  S/P left THA, AA 12/19/2016  . Chest pain 08/15/2016  . S/P right TH revision 02/09/2016  . Hip instability 02/05/2016  . H/O total hip arthroplasty 02/05/2016  . S/P right THA, AA 02/01/2016  . S/P knee replacement 11/27/2014  . Unspecified viral hepatitis C without hepatic coma 08/28/2014  . DYSPHONIA, CHRONIC 01/06/2010  . NONSPECIFIC ABNORMAL ELECTROCARDIOGRAM 01/06/2010  . HOARSENESS, CHRONIC 05/31/2009  . Hypothyroidism 04/09/2009  . HYPERLIPIDEMIA 04/09/2009  . HTN (hypertension) 04/09/2009  . GERD (gastroesophageal reflux disease) 04/09/2009  . Sleep apnea 04/09/2009   Past Medical History:  Diagnosis Date  . Alcohol abuse    Sobriety since 1990  . Anxiety   . Arthritis    oa  . Benign positional vertigo   . Depression   . Eardrum trauma    30 years ago/right ear  . GERD (gastroesophageal reflux disease)   . Headache    sinus  . Hepatitis C 1994   hep c - ? etiology; in [place] 1989-91; S/P interferon , Dr [name]  . History of bronchitis   . History of iron deficiency anemia   . Hoarseness   . Hyperlipidemia   . Hypertension   . Hypothyroidism   . Obesity   . OSA (obstructive sleep apnea)    on CPAP  . PONV (postoperative nausea and vomiting)    following knee surgery last year   .  PTSD (post-traumatic stress disorder)   . PTSD (post-traumatic stress disorder)   . 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 Vocal cord polyps     Past Surgical History:  Procedure Laterality Date  . ANTERIOR HIP REVISION Right 02/07/2016   Procedure: ANTERIOR HIP REVISION;  Surgeon: Paralee Cancel, MD;  [facility];  Service: Orthopedics;  Laterality: Right;  . CARDIOVASCULAR STRESS TEST  02/09/2010   No scintigraphic evidence of inducible ischemia.  . COLONOSCOPY  03/2013  . HIP CLOSED REDUCTION Right 02/05/2016   Procedure: CLOSED MANIPULATION HIP;  Surgeon: Latanya Maudlin, MD;  [facility];  Service: Orthopedics;  Laterality:  Right;  . knee arthroscopic Bilateral   . LARYNGOSCOPY  08/2009   Dr.[name]  . NASAL FRACTURE SURGERY     age 60  . right knee arthroscopic knee surgery  12 yrs ago   dr Theda Sers  . THROAT SURGERY  aug, sept, Nov 16 2016   vocal cord  laser sugery X 3, Dr [name] , [facility]  . TOTAL HIP ARTHROPLASTY Right 02/01/2016   Procedure: RIGHT TOTAL HIP ARTHROPLASTY ANTERIOR APPROACH;  Surgeon: Paralee Cancel, MD;  [facility];  Service: Orthopedics;  Laterality: Right;  . TOTAL HIP ARTHROPLASTY Left 12/19/2016   Procedure: LEFT TOTAL HIP ARTHROPLASTY ANTERIOR APPROACH;  Surgeon: Paralee Cancel, MD;  [facility];  Service: Orthopedics;  Laterality: Left;  . TOTAL HIP REVISION Right 11/11/2018   Procedure: right total hip arthroplasty revision, femoral stem;  Surgeon: Paralee Cancel, MD;  [facility];  Service: Orthopedics;  Laterality: Right;  75min  . TOTAL KNEE ARTHROPLASTY Left 11/27/2014   Procedure: LEFT TOTAL KNEE ARTHROPLASTY;  Surgeon: Augustin Schooling, MD;  Location: [place];  Service: Orthopedics;  Laterality: Left;  . TRANSTHORACIC ECHOCARDIOGRAM  11/08/2005   EF 68%, normal LV systolic function  . UPPER GASTROINTESTINAL ENDOSCOPY  2010   Negative, Dr.[name]    No current facility-administered medications for this encounter.   Current Outpatient Medications  Medication Sig Dispense Refill Last Dose  . b complex vitamins tablet Take 2 tablets by mouth daily.      . celecoxib (CELEBREX) 200 MG capsule Take 1 capsule by mouth daily.     . diclofenac Sodium (VOLTAREN) 1 % GEL Apply 1 application topically daily.     . ferrous sulfate (FERROUSUL) 325 (65 FE) MG tablet Take 1 tablet (325 mg total) by mouth 3 (three) times daily with meals.  3   . fluticasone (FLONASE) 50 MCG/ACT nasal spray Place 1 spray into both nostrils daily.      . hydrochlorothiazide (HYDRODIURIL) 25 MG tablet Take 1 tablet (25 mg total) by mouth daily. 90 tablet 3   . levothyroxine (SYNTHROID, LEVOTHROID) 125 MCG  tablet Take 250 mcg by mouth daily before breakfast.     . losartan (COZAAR) 50 MG tablet Take 50 mg by mouth daily.     . Multiple Vitamin (MULTIVITAMIN) tablet Take 1 tablet by mouth every morning.      . Pitavastatin Calcium (LIVALO) 2 MG TABS Take 2 mg by mouth at bedtime.     . vitamin C (ASCORBIC ACID) 500 MG tablet Take 1,000 mg by mouth daily.      Allergies  Allergen Reactions  . Dilaudid [Hydromorphone Hcl] Nausea And Vomiting    "Questionable as to whether it was actually dilaudid  That made me so severely nauseous and vomit"  . Benazepril Hcl Cough  Social History   Tobacco Use  . Smoking status: Never Smoker  . Smokeless tobacco: Never Used  . Tobacco comment: Quit at age 6  Substance Use Topics  . Alcohol use: No    Comment: alcoholism quit 30 years ago     Family History  Problem Relation Age of Onset  . Cirrhosis Father   . Diabetes Father   . Stroke Father   . Heart failure Mother   . Hypertension Mother   . Thyroid disease Mother   . Heart attack Brother 76  . Aneurysm Maternal Aunt         AAA  . Coronary artery disease Maternal Uncle        MI late 38s     Review of Systems  Constitutional: Negative.   HENT: Negative.   Eyes: Negative.   Respiratory: Negative.   Cardiovascular: Negative.   Gastrointestinal: Positive for heartburn.  Genitourinary: Negative.   Musculoskeletal: Positive for joint pain.  Skin: Negative.   Neurological: Negative.   Endo/Heme/Allergies: Negative.   Psychiatric/Behavioral: Positive for depression. The patient is nervous/anxious.      Objective:  Physical Exam  Constitutional: He is oriented to person, place, and time. He appears well-developed.  HENT:  Head: Normocephalic.  Eyes: Pupils are equal, round, and reactive to light.  Neck: No JVD present. No tracheal deviation present. No thyromegaly present.  Cardiovascular: Normal rate, regular rhythm and intact distal pulses.  Respiratory: Effort normal and  breath sounds normal. No respiratory distress. He has no wheezes.  GI: Soft. There is no abdominal tenderness. There is no guarding.  Musculoskeletal:     Cervical back: Neck supple.     Right knee: Swelling and bony tenderness present. No deformity, erythema, ecchymosis or lacerations. Decreased range of motion. Tenderness present.  Lymphadenopathy:    He has no cervical adenopathy.  Neurological: He is alert and oriented to person, place, and time.  Skin: Skin is warm and dry.  Psychiatric: He has a normal mood and affect.     Labs:  Estimated body mass index is 39.63 kg/m as calculated from the following:   Height as of 10/24/19: 6' (1.829 m).   Weight as of 10/24/19: 132.5 kg.   Imaging Review Plain radiographs demonstrate severe degenerative joint disease of the right knee. The overall alignment is neutral. The bone quality appears to be good for age and reported activity level.      Assessment/Plan:  End stage arthritis, right knee   The patient history, physical examination, clinical judgment of the provider and imaging studies are consistent with end stage degenerative joint disease of the right knee and total knee arthroplasty is deemed medically necessary. The treatment options including medical management, injection therapy arthroscopy and arthroplasty were discussed at length. The risks and benefits of total knee arthroplasty were presented and reviewed. The risks due to aseptic loosening, infection, stiffness, patella tracking problems, thromboembolic complications and other imponderables were discussed. The patient acknowledged the explanation, agreed to proceed with the plan and consent was signed. Patient is being admitted for treatment for surgery, pain control, PT, OT, prophylactic antibiotics, VTE prophylaxis, progressive ambulation and ADL's and discharge planning. The patient is planning to be discharged home.     Patient's anticipated LOS is less than 2  midnights, meeting these requirements: - Younger than 52 - Lives within 1 hour of care - Has a competent adult at home to recover with post-op recover - NO history of  - Chronic pain requiring  opiods  - Diabetes  - Coronary Artery Disease  - Heart failure  - Heart attack  - Stroke  - DVT/VTE  - Cardiac arrhythmia  - Respiratory Failure/COPD  - Renal failure  - Anemia  - Advanced Liver disease     [place] Pugh. Brittainy Bucker   PA-C  12/08/2019, 9:21 AM

## 2019-12-09 ENCOUNTER — Other Ambulatory Visit: Payer: Self-pay

## 2019-12-09 ENCOUNTER — Encounter (HOSPITAL_COMMUNITY)
Admission: RE | Admit: 2019-12-09 | Discharge: 2019-12-09 | Disposition: A | Discharge status: Home / Self Care | Payer: No Typology Code available for payment source | Source: Ambulatory Visit | Attending: Orthopedic Surgery | Admitting: Orthopedic Surgery

## 2019-12-09 DIAGNOSIS — Z8249 Family history of ischemic heart disease and other diseases of the circulatory system: Secondary | ICD-10-CM | POA: Diagnosis not present

## 2019-12-09 DIAGNOSIS — G4733 Obstructive sleep apnea (adult) (pediatric): Secondary | ICD-10-CM | POA: Diagnosis not present

## 2019-12-09 DIAGNOSIS — Z79899 Other long term (current) drug therapy: Secondary | ICD-10-CM | POA: Insufficient documentation

## 2019-12-09 DIAGNOSIS — M1711 Unilateral primary osteoarthritis, right knee: Secondary | ICD-10-CM | POA: Insufficient documentation

## 2019-12-09 DIAGNOSIS — E785 Hyperlipidemia, unspecified: Secondary | ICD-10-CM | POA: Insufficient documentation

## 2019-12-09 DIAGNOSIS — E039 Hypothyroidism, unspecified: Secondary | ICD-10-CM | POA: Diagnosis not present

## 2019-12-09 DIAGNOSIS — Z01812 Encounter for preprocedural laboratory examination: Secondary | ICD-10-CM | POA: Diagnosis not present

## 2019-12-09 DIAGNOSIS — Z96643 Presence of artificial hip joint, bilateral: Secondary | ICD-10-CM | POA: Diagnosis not present

## 2019-12-09 DIAGNOSIS — Z79890 Hormone replacement therapy (postmenopausal): Secondary | ICD-10-CM | POA: Insufficient documentation

## 2019-12-09 DIAGNOSIS — D509 Iron deficiency anemia, unspecified: Secondary | ICD-10-CM | POA: Diagnosis not present

## 2019-12-09 DIAGNOSIS — I10 Essential (primary) hypertension: Secondary | ICD-10-CM | POA: Diagnosis not present

## 2019-12-09 DIAGNOSIS — E669 Obesity, unspecified: Secondary | ICD-10-CM | POA: Insufficient documentation

## 2019-12-09 DIAGNOSIS — Z96652 Presence of left artificial knee joint: Secondary | ICD-10-CM | POA: Insufficient documentation

## 2019-12-09 DIAGNOSIS — F1021 Alcohol dependence, in remission: Secondary | ICD-10-CM | POA: Diagnosis not present

## 2019-12-09 DIAGNOSIS — Z6838 Body mass index (BMI) 38.0-38.9, adult: Secondary | ICD-10-CM | POA: Insufficient documentation

## 2019-12-09 DIAGNOSIS — Z791 Long term (current) use of non-steroidal anti-inflammatories (NSAID): Secondary | ICD-10-CM | POA: Insufficient documentation

## 2019-12-09 LAB — BASIC METABOLIC PANEL
Anion gap: 8 (ref 5–15)
BUN: 13 mg/dL (ref 6–20)
CO2: 28 mmol/L (ref 22–32)
Calcium: 9 mg/dL (ref 8.9–10.3)
Chloride: 102 mmol/L (ref 98–111)
Creatinine, Ser: 0.7 mg/dL (ref 0.61–1.24)
GFR calc Af Amer: >60 mL/min (ref >60)
GFR calc non Af Amer: >60 mL/min (ref >60)
Glucose, Bld: 108 mg/dL — ABNORMAL HIGH (ref 70–99)
Potassium: 4.5 mmol/L (ref 3.5–5.1)
Sodium: 138 mmol/L (ref 135–145)

## 2019-12-09 LAB — SURGICAL PCR SCREEN
MRSA, PCR: NEGATIVE
Staphylococcus aureus: POSITIVE — AB

## 2019-12-09 LAB — CBC
HCT: 40 % (ref 39.0–52.0)
Hemoglobin: 13.6 g/dL (ref 13.0–17.0)
MCH: 30.6 pg (ref 26.0–34.0)
MCHC: 34 g/dL (ref 30.0–36.0)
MCV: 89.9 fL (ref 80.0–100.0)
Platelets: 191 10*3/uL (ref 150–400)
RBC: 4.45 MIL/uL (ref 4.22–5.81)
RDW: 12.5 % (ref 11.5–15.5)
WBC: 5.2 10*3/uL (ref 4.0–10.5)
nRBC: 0 % (ref 0.0–0.2)

## 2019-12-09 NOTE — Progress Notes (Signed)
Anesthesia Chart Review   Case: [id number] Date/Time: 12/16/19 0950   Procedure: TOTAL KNEE ARTHROPLASTY (Right Knee) - 70 mins   Anesthesia type: Spinal   Pre-op diagnosis: Right knee osteoarthritis   [facility]   Surgeons: Paralee Cancel, MD      DISCUSSION:58 y.o. never smoker with h/o PONV, HLD, HTN, GERD, hypothyroidism, BPV, PTSD, OSA, Hepatitis C treated, right knee OA scheduled for above procedure 12/16/2019 with Dr. Paralee Cancel.   Pt last seen by cardiologist, Dr. Quay Burow, 10/24/2019.  Cleared for above procedure.  Per Sande Rives, PA-C, "Patient has no known cardiac history but does have family history of CAD and multiple risk factors including hypertension, hyperlipidemia, and obesity. He was recently seen by Dr Gwenlyn Found on 10/24/2019 and denied any chest pain or shortness of breath. Dr. Gwenlyn Found aware of need for right knee replacement and did not mention anything in his note about need for additional cardiac testing. Given past medical history and time since last visit, based on ACC/AHA guidelines, JEANPIERRE IVERY would be at acceptable risk for the planned procedure without further cardiovascular testing."  Anticipate pt can proceed with planned procedure barring acute status change.   VS: BP (!) 144/66 (BP Location: Left Arm)   Pulse 68   Temp 36.6 C (Oral)   Resp 17   Ht 6' (1.829 m)   Wt 130.3 kg   SpO2 99%   BMI 38.95 kg/m   PROVIDERS: [name], MD is PCP  Quay Burow, MD is Cardiologist  LABS: Labs reviewed: Acceptable for surgery. (all labs ordered are listed, but only abnormal results are displayed)  Labs Reviewed  SURGICAL PCR SCREEN - Abnormal; Notable for the following components:      Result Value   Staphylococcus aureus POSITIVE (*)    All other components within normal limits  BASIC METABOLIC PANEL - Abnormal; Notable for the following components:   Glucose, Bld 108 (*)    All other components within normal limits   CBC  TYPE AND SCREEN     IMAGES:   EKG: 10/24/2019 Rate 66 bpm Normal sinus rhythm   CV: Myocardial Perfusion 08/30/2016  The left ventricular ejection fraction is normal (55-65%).  Nuclear stress EF by computer estimation : 48%. Visually the EF is much better and is likely to be closer to 65%.  There was no ST segment deviation noted during stress.  The study is normal.  This is a low risk study. Past Medical History:  Diagnosis Date  . Alcohol abuse    Sobriety since 1990  . Anxiety   . Arthritis    oa  . Benign positional vertigo   . Depression   . Eardrum trauma    30 years ago/right ear  . GERD (gastroesophageal reflux disease)   . Headache    sinus  . Hepatitis C 1994   hep c - ? etiology; in [place] 1989-91; S/P interferon , Dr [name]  . History of bronchitis   . History of iron deficiency anemia   . Hoarseness   . Hyperlipidemia   . Hypertension   . Hypothyroidism   . Obesity   . OSA (obstructive sleep apnea)    on CPAP  . PONV (postoperative nausea and vomiting)    following knee surgery last year   . PTSD (post-traumatic stress disorder)   . PTSD (post-traumatic stress disorder)   . 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  Vocal cord polyps     Past Surgical History:  Procedure Laterality Date  . ANTERIOR HIP REVISION Right 02/07/2016   Procedure: ANTERIOR HIP REVISION;  Surgeon: Paralee Cancel, MD;  [facility];  Service: Orthopedics;  Laterality: Right;  . CARDIOVASCULAR STRESS TEST  02/09/2010   No scintigraphic evidence of inducible ischemia.  . COLONOSCOPY  03/2013  . HIP CLOSED REDUCTION Right 02/05/2016   Procedure: CLOSED MANIPULATION HIP;  Surgeon: Latanya Maudlin, MD;  [facility];  Service: Orthopedics;  Laterality: Right;  . knee arthroscopic Bilateral   . LARYNGOSCOPY  08/2009   Dr.[name]  . NASAL FRACTURE SURGERY     age 13  . right knee arthroscopic knee surgery  12 yrs ago    dr Theda Sers  . THROAT SURGERY  aug, sept, Nov 16 2016   vocal cord  laser sugery X 3, Dr [name] , [facility]  . TOTAL HIP ARTHROPLASTY Right 02/01/2016   Procedure: RIGHT TOTAL HIP ARTHROPLASTY ANTERIOR APPROACH;  Surgeon: Paralee Cancel, MD;  [facility];  Service: Orthopedics;  Laterality: Right;  . TOTAL HIP ARTHROPLASTY Left 12/19/2016   Procedure: LEFT TOTAL HIP ARTHROPLASTY ANTERIOR APPROACH;  Surgeon: Paralee Cancel, MD;  [facility];  Service: Orthopedics;  Laterality: Left;  . TOTAL HIP REVISION Right 11/11/2018   Procedure: right total hip arthroplasty revision, femoral stem;  Surgeon: Paralee Cancel, MD;  [facility];  Service: Orthopedics;  Laterality: Right;  79min  . TOTAL KNEE ARTHROPLASTY Left 11/27/2014   Procedure: LEFT TOTAL KNEE ARTHROPLASTY;  Surgeon: Augustin Schooling, MD;  Location: [place];  Service: Orthopedics;  Laterality: Left;  . TRANSTHORACIC ECHOCARDIOGRAM  11/08/2005   EF 68%, normal LV systolic function  . UPPER GASTROINTESTINAL ENDOSCOPY  2010   Negative, Dr.[name]    MEDICATIONS: . Ascorbic Acid (VITAMIN C) 1000 MG tablet  . b complex vitamins tablet  . celecoxib (CELEBREX) 200 MG capsule  . diclofenac Sodium (VOLTAREN) 1 % GEL  . ferrous sulfate (FERROUSUL) 325 (65 FE) MG tablet  . fluticasone (FLONASE) 50 MCG/ACT nasal spray  . hydrochlorothiazide (HYDRODIURIL) 25 MG tablet  . levothyroxine (SYNTHROID, LEVOTHROID) 125 MCG tablet  . losartan (COZAAR) 50 MG tablet  . Multiple Vitamin (MULTIVITAMIN) tablet  . Pitavastatin Calcium (LIVALO) 2 MG TABS   No current facility-administered medications for this encounter.    Maia Plan [facility] Pre-Surgical Testing [phone] 12/10/19  3:51 PM

## 2019-12-09 NOTE — Progress Notes (Signed)
Samuel Goodwin came in for lab work and was informed that his surgery time was changed to 10:05AM arriving at 7:35 AM and drinking pre surgical ensure drink at 7:05 am he verbalized understanding.

## 2019-12-10 NOTE — Anesthesia Preprocedure Evaluation (Addendum)
Anesthesia Evaluation  Patient identified by MRN, date of birth, ID band Patient awake    Reviewed: Allergy & Precautions, NPO status , Patient's Chart, lab work & pertinent test results  History of Anesthesia Complications (+) PONV  Airway Mallampati: II  TM Distance: >3 FB Neck ROM: Full    Dental  (+) Dental Advisory Given, Chipped   Pulmonary sleep apnea and Continuous Positive Airway Pressure Ventilation ,  12/13/2019 SARS coronavirus NEG Squamous cell of vocal cord   breath sounds clear to auscultation       Cardiovascular hypertension, Pt. on medications (-) angina Rhythm:Regular Rate:Normal     Neuro/Psych PSYCHIATRIC DISORDERS (PTSD) Anxiety Depression negative neurological ROS     GI/Hepatic Neg liver ROS, GERD  Controlled,  Endo/Other  Hypothyroidism Morbid obesity  Renal/GU negative Renal ROS     Musculoskeletal  (+) Arthritis , Osteoarthritis,    Abdominal (+) + obese,   Peds  Hematology   Anesthesia Other Findings   Reproductive/Obstetrics                           Anesthesia Physical Anesthesia Plan  ASA: III  Anesthesia Plan: Spinal   Post-op Pain Management:  Regional for Post-op pain   Induction:   PONV Risk Score and Plan: 2 and Ondansetron and Treatment may vary due to age or medical condition  Airway Management Planned: Natural Airway and Simple Face Mask  Additional Equipment:   Intra-op Plan:   Post-operative Plan:   Informed Consent: I have reviewed the patients History and Physical, chart, labs and discussed the procedure including the risks, benefits and alternatives for the proposed anesthesia with the patient or authorized representative who has indicated his/her understanding and acceptance.     Dental advisory given  Plan Discussed with: CRNA and Surgeon  Anesthesia Plan Comments: (See PAT note 12/09/2019, Konrad Felix, PA-C Plan routine  monitors, SAB with adductor canal block for post op analgesia)      Anesthesia Quick Evaluation

## 2019-12-13 ENCOUNTER — Other Ambulatory Visit (HOSPITAL_COMMUNITY)
Admission: RE | Admit: 2019-12-13 | Discharge: 2019-12-13 | Disposition: A | Discharge status: Home / Self Care | Payer: No Typology Code available for payment source | Source: Ambulatory Visit | Attending: Orthopedic Surgery | Admitting: Orthopedic Surgery

## 2019-12-13 DIAGNOSIS — Z01812 Encounter for preprocedural laboratory examination: Secondary | ICD-10-CM | POA: Diagnosis present

## 2019-12-13 DIAGNOSIS — Z20822 Contact with and (suspected) exposure to covid-19: Secondary | ICD-10-CM | POA: Diagnosis not present

## 2019-12-13 LAB — SARS CORONAVIRUS 2 (TAT 6-24 HRS): SARS Coronavirus 2: NEGATIVE

## 2019-12-15 ENCOUNTER — Encounter (HOSPITAL_COMMUNITY): Payer: Self-pay | Admitting: Orthopedic Surgery

## 2019-12-15 MED ORDER — DEXTROSE 5 % IV SOLN
3.0000 g | INTRAVENOUS | Status: AC
  Administered 2019-12-16: 3 g via INTRAVENOUS
  Filled 2019-12-15: qty 3

## 2019-12-16 ENCOUNTER — Encounter (HOSPITAL_COMMUNITY)
Admission: RE | Disposition: A | Discharge status: Home / Self Care | Payer: Self-pay | Source: Other Acute Inpatient Hospital | Attending: Orthopedic Surgery

## 2019-12-16 ENCOUNTER — Ambulatory Visit (HOSPITAL_COMMUNITY): Payer: No Typology Code available for payment source | Admitting: Anesthesiology

## 2019-12-16 ENCOUNTER — Ambulatory Visit (HOSPITAL_COMMUNITY): Payer: No Typology Code available for payment source | Admitting: Physician Assistant

## 2019-12-16 ENCOUNTER — Observation Stay (HOSPITAL_COMMUNITY)
Admission: RE | Admit: 2019-12-16 | Discharge: 2019-12-17 | Disposition: A | Discharge status: Home / Self Care | Payer: No Typology Code available for payment source | Source: Other Acute Inpatient Hospital | Attending: Orthopedic Surgery | Admitting: Orthopedic Surgery

## 2019-12-16 ENCOUNTER — Encounter (HOSPITAL_COMMUNITY): Payer: Self-pay | Admitting: Orthopedic Surgery

## 2019-12-16 ENCOUNTER — Other Ambulatory Visit: Payer: Self-pay

## 2019-12-16 DIAGNOSIS — Z79899 Other long term (current) drug therapy: Secondary | ICD-10-CM | POA: Diagnosis not present

## 2019-12-16 DIAGNOSIS — Z79890 Hormone replacement therapy (postmenopausal): Secondary | ICD-10-CM | POA: Insufficient documentation

## 2019-12-16 DIAGNOSIS — B1920 Unspecified viral hepatitis C without hepatic coma: Secondary | ICD-10-CM | POA: Insufficient documentation

## 2019-12-16 DIAGNOSIS — Z96652 Presence of left artificial knee joint: Secondary | ICD-10-CM | POA: Diagnosis not present

## 2019-12-16 DIAGNOSIS — Z8521 Personal history of malignant neoplasm of larynx: Secondary | ICD-10-CM | POA: Diagnosis not present

## 2019-12-16 DIAGNOSIS — E039 Hypothyroidism, unspecified: Secondary | ICD-10-CM | POA: Insufficient documentation

## 2019-12-16 DIAGNOSIS — Z6838 Body mass index (BMI) 38.0-38.9, adult: Secondary | ICD-10-CM | POA: Insufficient documentation

## 2019-12-16 DIAGNOSIS — E785 Hyperlipidemia, unspecified: Secondary | ICD-10-CM | POA: Diagnosis not present

## 2019-12-16 DIAGNOSIS — I10 Essential (primary) hypertension: Secondary | ICD-10-CM | POA: Diagnosis not present

## 2019-12-16 DIAGNOSIS — M1711 Unilateral primary osteoarthritis, right knee: Secondary | ICD-10-CM | POA: Diagnosis not present

## 2019-12-16 DIAGNOSIS — Z791 Long term (current) use of non-steroidal anti-inflammatories (NSAID): Secondary | ICD-10-CM | POA: Insufficient documentation

## 2019-12-16 DIAGNOSIS — Z96643 Presence of artificial hip joint, bilateral: Secondary | ICD-10-CM | POA: Diagnosis not present

## 2019-12-16 DIAGNOSIS — K219 Gastro-esophageal reflux disease without esophagitis: Secondary | ICD-10-CM | POA: Insufficient documentation

## 2019-12-16 DIAGNOSIS — Z96651 Presence of right artificial knee joint: Secondary | ICD-10-CM

## 2019-12-16 DIAGNOSIS — G4733 Obstructive sleep apnea (adult) (pediatric): Secondary | ICD-10-CM | POA: Insufficient documentation

## 2019-12-16 DIAGNOSIS — F4310 Post-traumatic stress disorder, unspecified: Secondary | ICD-10-CM | POA: Insufficient documentation

## 2019-12-16 HISTORY — PX: TOTAL KNEE ARTHROPLASTY: SHX125

## 2019-12-16 HISTORY — DX: Presence of right artificial knee joint: Z96.651

## 2019-12-16 LAB — TYPE AND SCREEN
ABO/RH(D): O POS
Antibody Screen: NEGATIVE

## 2019-12-16 SURGERY — ARTHROPLASTY, KNEE, TOTAL
Anesthesia: Spinal | Site: Knee | Laterality: Right

## 2019-12-16 MED ORDER — BISACODYL 10 MG RE SUPP: 10.0000 mg | Freq: Every day | RECTAL | Status: DC | PRN

## 2019-12-16 MED ORDER — HYDROCHLOROTHIAZIDE 25 MG PO TABS
25.0000 mg | ORAL_TABLET | Freq: Every day | ORAL | Status: DC
  Administered 2019-12-16 – 2019-12-17 (×2): 25 mg via ORAL
  Filled 2019-12-16 (×2): qty 1

## 2019-12-16 MED ORDER — METOCLOPRAMIDE HCL 5 MG PO TABS: 5.0000 mg | ORAL_TABLET | Freq: Three times a day (TID) | ORAL | Status: DC | PRN

## 2019-12-16 MED ORDER — PROPOFOL 500 MG/50ML IV EMUL
INTRAVENOUS | Status: DC | PRN
  Administered 2019-12-16: 75 ug/kg/min via INTRAVENOUS

## 2019-12-16 MED ORDER — HYDROCODONE-ACETAMINOPHEN 7.5-325 MG PO TABS: 1.0000 | ORAL_TABLET | ORAL | 0 refills | Status: DC | PRN

## 2019-12-16 MED ORDER — LEVOTHYROXINE SODIUM 125 MCG PO TABS
250.0000 ug | ORAL_TABLET | Freq: Every day | ORAL | Status: DC
  Administered 2019-12-17: 250 ug via ORAL
  Filled 2019-12-16: qty 2

## 2019-12-16 MED ORDER — DOCUSATE SODIUM 100 MG PO CAPS
100.0000 mg | ORAL_CAPSULE | Freq: Two times a day (BID) | ORAL | Status: DC
  Administered 2019-12-16 – 2019-12-17 (×3): 100 mg via ORAL
  Filled 2019-12-16 (×3): qty 1

## 2019-12-16 MED ORDER — FENTANYL CITRATE (PF) 100 MCG/2ML IJ SOLN
INTRAMUSCULAR | Status: AC
  Filled 2019-12-16: qty 2

## 2019-12-16 MED ORDER — FERROUS SULFATE 325 (65 FE) MG PO TABS: 325.0000 mg | ORAL_TABLET | Freq: Three times a day (TID) | ORAL | 0 refills | Status: DC

## 2019-12-16 MED ORDER — DEXAMETHASONE SODIUM PHOSPHATE 10 MG/ML IJ SOLN
10.0000 mg | Freq: Once | INTRAMUSCULAR | Status: AC
  Administered 2019-12-16: 10 mg via INTRAVENOUS

## 2019-12-16 MED ORDER — MORPHINE SULFATE (PF) 2 MG/ML IV SOLN: 0.5000 mg | INTRAVENOUS | Status: DC | PRN

## 2019-12-16 MED ORDER — ONDANSETRON HCL 4 MG/2ML IJ SOLN: 4.0000 mg | Freq: Four times a day (QID) | INTRAMUSCULAR | Status: DC | PRN

## 2019-12-16 MED ORDER — PROPOFOL 10 MG/ML IV BOLUS
INTRAVENOUS | Status: AC
  Filled 2019-12-16: qty 20

## 2019-12-16 MED ORDER — PROMETHAZINE HCL 25 MG/ML IJ SOLN: 6.2500 mg | INTRAMUSCULAR | Status: DC | PRN

## 2019-12-16 MED ORDER — FENTANYL CITRATE (PF) 100 MCG/2ML IJ SOLN
INTRAMUSCULAR | Status: AC
  Administered 2019-12-16: 50 ug via INTRAVENOUS
  Filled 2019-12-16: qty 2

## 2019-12-16 MED ORDER — LOSARTAN POTASSIUM 50 MG PO TABS
50.0000 mg | ORAL_TABLET | Freq: Every day | ORAL | Status: DC
  Administered 2019-12-16 – 2019-12-17 (×2): 50 mg via ORAL
  Filled 2019-12-16 (×2): qty 1

## 2019-12-16 MED ORDER — FENTANYL CITRATE (PF) 100 MCG/2ML IJ SOLN
INTRAMUSCULAR | Status: DC | PRN
  Administered 2019-12-16 (×2): 25 ug via INTRAVENOUS

## 2019-12-16 MED ORDER — METHOCARBAMOL 500 MG IVPB - SIMPLE MED
INTRAVENOUS | Status: AC
  Filled 2019-12-16: qty 50

## 2019-12-16 MED ORDER — POLYETHYLENE GLYCOL 3350 17 G PO PACK: 17.0000 g | PACK | Freq: Two times a day (BID) | ORAL | Status: DC

## 2019-12-16 MED ORDER — METHOCARBAMOL 500 MG PO TABS: 500.0000 mg | ORAL_TABLET | Freq: Four times a day (QID) | ORAL | 0 refills | Status: DC | PRN

## 2019-12-16 MED ORDER — FERROUS SULFATE 325 (65 FE) MG PO TABS
325.0000 mg | ORAL_TABLET | Freq: Two times a day (BID) | ORAL | Status: DC
  Administered 2019-12-16 – 2019-12-17 (×2): 325 mg via ORAL
  Filled 2019-12-16 (×2): qty 1

## 2019-12-16 MED ORDER — POVIDONE-IODINE 10 % EX SWAB
2.0000 "application " | Freq: Once | CUTANEOUS | Status: AC
  Administered 2019-12-16: 2 via TOPICAL

## 2019-12-16 MED ORDER — MIDAZOLAM HCL 5 MG/5ML IJ SOLN
INTRAMUSCULAR | Status: DC | PRN
  Administered 2019-12-16 (×2): 1 mg via INTRAVENOUS

## 2019-12-16 MED ORDER — CELECOXIB 200 MG PO CAPS: 200.0000 mg | ORAL_CAPSULE | Freq: Two times a day (BID) | ORAL | 0 refills | Status: DC

## 2019-12-16 MED ORDER — BUPIVACAINE HCL 0.25 % IJ SOLN
INTRAMUSCULAR | Status: DC | PRN
  Administered 2019-12-16: 30 mL

## 2019-12-16 MED ORDER — SODIUM CHLORIDE 0.9 % IR SOLN
Status: DC | PRN
  Administered 2019-12-16: 1000 mL

## 2019-12-16 MED ORDER — FENTANYL CITRATE (PF) 100 MCG/2ML IJ SOLN
50.0000 ug | INTRAMUSCULAR | Status: DC
  Administered 2019-12-16: 100 ug via INTRAVENOUS
  Filled 2019-12-16: qty 2

## 2019-12-16 MED ORDER — LACTATED RINGERS IV BOLUS
500.0000 mL | Freq: Once | INTRAVENOUS | Status: AC
  Administered 2019-12-16: 14:00:00 500 mL via INTRAVENOUS

## 2019-12-16 MED ORDER — FENTANYL CITRATE (PF) 100 MCG/2ML IJ SOLN: 25.0000 ug | INTRAMUSCULAR | Status: DC | PRN

## 2019-12-16 MED ORDER — PHENOL 1.4 % MT LIQD: 1.0000 | OROMUCOSAL | Status: DC | PRN

## 2019-12-16 MED ORDER — SODIUM CHLORIDE 0.9 % IV SOLN: INTRAVENOUS | Status: DC

## 2019-12-16 MED ORDER — KETOROLAC TROMETHAMINE 30 MG/ML IJ SOLN
INTRAMUSCULAR | Status: AC
  Filled 2019-12-16: qty 1

## 2019-12-16 MED ORDER — KETOROLAC TROMETHAMINE 30 MG/ML IJ SOLN
INTRAMUSCULAR | Status: DC | PRN
  Administered 2019-12-16: 30 mg via INTRAVENOUS

## 2019-12-16 MED ORDER — DOCUSATE SODIUM 100 MG PO CAPS: 100.0000 mg | ORAL_CAPSULE | Freq: Two times a day (BID) | ORAL | 0 refills | Status: DC

## 2019-12-16 MED ORDER — CELECOXIB 200 MG PO CAPS
200.0000 mg | ORAL_CAPSULE | Freq: Two times a day (BID) | ORAL | Status: DC
  Administered 2019-12-16 – 2019-12-17 (×2): 200 mg via ORAL
  Filled 2019-12-16 (×2): qty 1

## 2019-12-16 MED ORDER — DIPHENHYDRAMINE HCL 12.5 MG/5ML PO ELIX: 12.5000 mg | ORAL_SOLUTION | ORAL | Status: DC | PRN

## 2019-12-16 MED ORDER — CELECOXIB 200 MG PO CAPS: 200.0000 mg | ORAL_CAPSULE | Freq: Two times a day (BID) | ORAL | Status: DC

## 2019-12-16 MED ORDER — ASPIRIN 81 MG PO CHEW: 81.0000 mg | CHEWABLE_TABLET | Freq: Two times a day (BID) | ORAL | Status: DC

## 2019-12-16 MED ORDER — TRANEXAMIC ACID-NACL 1000-0.7 MG/100ML-% IV SOLN
1000.0000 mg | Freq: Once | INTRAVENOUS | Status: AC
  Administered 2019-12-16: 1000 mg via INTRAVENOUS
  Filled 2019-12-16: qty 100

## 2019-12-16 MED ORDER — FLUTICASONE PROPIONATE 50 MCG/ACT NA SUSP
1.0000 | Freq: Every day | NASAL | Status: DC | PRN
  Filled 2019-12-16: qty 16

## 2019-12-16 MED ORDER — HYDROCODONE-ACETAMINOPHEN 7.5-325 MG PO TABS
1.0000 | ORAL_TABLET | ORAL | Status: DC | PRN
  Administered 2019-12-17 (×2): 2 via ORAL
  Filled 2019-12-16 (×2): qty 2

## 2019-12-16 MED ORDER — STERILE WATER FOR IRRIGATION IR SOLN
Status: DC | PRN
  Administered 2019-12-16: 2000 mL

## 2019-12-16 MED ORDER — DOCUSATE SODIUM 100 MG PO CAPS: 100.0000 mg | ORAL_CAPSULE | Freq: Two times a day (BID) | ORAL | Status: DC

## 2019-12-16 MED ORDER — MIDAZOLAM HCL 2 MG/2ML IJ SOLN
INTRAMUSCULAR | Status: AC
  Filled 2019-12-16: qty 2

## 2019-12-16 MED ORDER — METOCLOPRAMIDE HCL 5 MG/ML IJ SOLN: 5.0000 mg | Freq: Three times a day (TID) | INTRAMUSCULAR | Status: DC | PRN

## 2019-12-16 MED ORDER — MIDAZOLAM HCL 2 MG/2ML IJ SOLN: 0.5000 mg | Freq: Once | INTRAMUSCULAR | Status: DC | PRN

## 2019-12-16 MED ORDER — MENTHOL 3 MG MT LOZG: 1.0000 | LOZENGE | OROMUCOSAL | Status: DC | PRN

## 2019-12-16 MED ORDER — ALUM & MAG HYDROXIDE-SIMETH 200-200-20 MG/5ML PO SUSP: 15.0000 mL | ORAL | Status: DC | PRN

## 2019-12-16 MED ORDER — HYDROCODONE-ACETAMINOPHEN 5-325 MG PO TABS
1.0000 | ORAL_TABLET | ORAL | Status: DC | PRN
  Administered 2019-12-16: 2 via ORAL
  Administered 2019-12-16: 1 via ORAL
  Administered 2019-12-17 (×2): 2 via ORAL
  Filled 2019-12-16 (×4): qty 2
  Filled 2019-12-16: qty 1

## 2019-12-16 MED ORDER — ASPIRIN 81 MG PO CHEW
81.0000 mg | CHEWABLE_TABLET | Freq: Two times a day (BID) | ORAL | Status: DC
  Administered 2019-12-16 – 2019-12-17 (×2): 81 mg via ORAL
  Filled 2019-12-16 (×2): qty 1

## 2019-12-16 MED ORDER — CHLORHEXIDINE GLUCONATE 4 % EX LIQD: 60.0000 mL | Freq: Once | CUTANEOUS | Status: DC

## 2019-12-16 MED ORDER — ONDANSETRON HCL 4 MG PO TABS: 4.0000 mg | ORAL_TABLET | Freq: Four times a day (QID) | ORAL | Status: DC | PRN

## 2019-12-16 MED ORDER — TRANEXAMIC ACID-NACL 1000-0.7 MG/100ML-% IV SOLN
1000.0000 mg | INTRAVENOUS | Status: AC
  Administered 2019-12-16: 1000 mg via INTRAVENOUS
  Filled 2019-12-16: qty 100

## 2019-12-16 MED ORDER — METHOCARBAMOL 500 MG IVPB - SIMPLE MED
500.0000 mg | Freq: Four times a day (QID) | INTRAVENOUS | Status: DC | PRN
  Administered 2019-12-16: 500 mg via INTRAVENOUS
  Filled 2019-12-16: qty 50

## 2019-12-16 MED ORDER — HYDROCODONE-ACETAMINOPHEN 7.5-325 MG PO TABS: 1.0000 | ORAL_TABLET | ORAL | Status: DC | PRN

## 2019-12-16 MED ORDER — 0.9 % SODIUM CHLORIDE (POUR BTL) OPTIME
TOPICAL | Status: DC | PRN
  Administered 2019-12-16: 10:00:00 1000 mL

## 2019-12-16 MED ORDER — FENTANYL CITRATE (PF) 100 MCG/2ML IJ SOLN
25.0000 ug | INTRAMUSCULAR | Status: DC | PRN
  Administered 2019-12-16 (×2): 50 ug via INTRAVENOUS

## 2019-12-16 MED ORDER — DEXAMETHASONE SODIUM PHOSPHATE 10 MG/ML IJ SOLN
10.0000 mg | Freq: Once | INTRAMUSCULAR | Status: AC
  Administered 2019-12-17: 10 mg via INTRAVENOUS
  Filled 2019-12-16: qty 1

## 2019-12-16 MED ORDER — LACTATED RINGERS IV SOLN: INTRAVENOUS | Status: DC

## 2019-12-16 MED ORDER — HYDROCODONE-ACETAMINOPHEN 5-325 MG PO TABS: 1.0000 | ORAL_TABLET | ORAL | Status: DC | PRN

## 2019-12-16 MED ORDER — LACTATED RINGERS IV BOLUS: 250.0000 mL | Freq: Once | INTRAVENOUS | Status: DC

## 2019-12-16 MED ORDER — ONDANSETRON HCL 4 MG/2ML IJ SOLN
INTRAMUSCULAR | Status: DC | PRN
  Administered 2019-12-16: 4 mg via INTRAVENOUS

## 2019-12-16 MED ORDER — ASPIRIN 81 MG PO CHEW: 81.0000 mg | CHEWABLE_TABLET | Freq: Two times a day (BID) | ORAL | 0 refills | Status: AC

## 2019-12-16 MED ORDER — ROPIVACAINE HCL 7.5 MG/ML IJ SOLN
INTRAMUSCULAR | Status: DC | PRN
  Administered 2019-12-16: 20 mL via PERINEURAL

## 2019-12-16 MED ORDER — METHOCARBAMOL 500 MG PO TABS
500.0000 mg | ORAL_TABLET | Freq: Four times a day (QID) | ORAL | Status: DC | PRN
  Administered 2019-12-16 – 2019-12-17 (×3): 500 mg via ORAL
  Filled 2019-12-16 (×3): qty 1

## 2019-12-16 MED ORDER — DEXAMETHASONE SODIUM PHOSPHATE 10 MG/ML IJ SOLN: 10.0000 mg | Freq: Once | INTRAMUSCULAR | Status: DC

## 2019-12-16 MED ORDER — MEPERIDINE HCL 50 MG/ML IJ SOLN: 6.2500 mg | INTRAMUSCULAR | Status: DC | PRN

## 2019-12-16 MED ORDER — SODIUM CHLORIDE (PF) 0.9 % IJ SOLN
INTRAMUSCULAR | Status: DC | PRN
  Administered 2019-12-16: 30 mL

## 2019-12-16 MED ORDER — CEFAZOLIN SODIUM-DEXTROSE 2-4 GM/100ML-% IV SOLN
2.0000 g | Freq: Four times a day (QID) | INTRAVENOUS | Status: AC
  Administered 2019-12-16 (×2): 2 g via INTRAVENOUS
  Filled 2019-12-16 (×2): qty 100

## 2019-12-16 MED ORDER — MAGNESIUM CITRATE PO SOLN: 1.0000 | Freq: Once | ORAL | Status: DC | PRN

## 2019-12-16 MED ORDER — TRANEXAMIC ACID-NACL 1000-0.7 MG/100ML-% IV SOLN: 1000.0000 mg | Freq: Once | INTRAVENOUS | Status: DC

## 2019-12-16 MED ORDER — BUPIVACAINE IN DEXTROSE 0.75-8.25 % IT SOLN
INTRATHECAL | Status: DC | PRN
  Administered 2019-12-16: 15 mg via INTRATHECAL

## 2019-12-16 MED ORDER — POLYETHYLENE GLYCOL 3350 17 G PO PACK
17.0000 g | PACK | Freq: Two times a day (BID) | ORAL | Status: DC
  Administered 2019-12-16: 17 g via ORAL
  Filled 2019-12-16 (×2): qty 1

## 2019-12-16 MED ORDER — POLYETHYLENE GLYCOL 3350 17 G PO PACK: 17.0000 g | PACK | Freq: Two times a day (BID) | ORAL | 0 refills | Status: DC

## 2019-12-16 MED ORDER — ACETAMINOPHEN 325 MG PO TABS: 325.0000 mg | ORAL_TABLET | Freq: Four times a day (QID) | ORAL | Status: DC | PRN

## 2019-12-16 MED ORDER — MIDAZOLAM HCL 2 MG/2ML IJ SOLN
1.0000 mg | INTRAMUSCULAR | Status: DC
  Administered 2019-12-16: 09:00:00 2 mg via INTRAVENOUS
  Filled 2019-12-16: qty 2

## 2019-12-16 MED ORDER — FERROUS SULFATE 325 (65 FE) MG PO TABS: 325.0000 mg | ORAL_TABLET | Freq: Two times a day (BID) | ORAL | Status: DC

## 2019-12-16 MED ORDER — SODIUM CHLORIDE (PF) 0.9 % IJ SOLN
INTRAMUSCULAR | Status: AC
  Filled 2019-12-16: qty 50

## 2019-12-16 MED ORDER — CEFAZOLIN SODIUM-DEXTROSE 2-4 GM/100ML-% IV SOLN: 2.0000 g | Freq: Four times a day (QID) | INTRAVENOUS | Status: DC

## 2019-12-16 MED ORDER — BUPIVACAINE HCL (PF) 0.25 % IJ SOLN
INTRAMUSCULAR | Status: AC
  Filled 2019-12-16: qty 30

## 2019-12-16 SURGICAL SUPPLY — 66 items
ADH SKN CLS APL DERMABOND .7 (GAUZE/BANDAGES/DRESSINGS) ×1
ATTUNE MED ANAT PAT 38 KNEE (Knees) ×1 IMPLANT
ATTUNE PS FEM RT SZ 6 CEM KNEE (Femur) ×1 IMPLANT
ATTUNE PSRP INSR SZ6 5 KNEE (Insert) ×1 IMPLANT
BAG SPEC THK2 15X12 ZIP CLS (MISCELLANEOUS)
BAG ZIPLOCK 12X15 (MISCELLANEOUS) IMPLANT
BASE TIBIAL ROT PLAT SZ 7 KNEE (Knees) IMPLANT
BLADE SAW SGTL 11.0X1.19X90.0M (BLADE) IMPLANT
BLADE SAW SGTL 13.0X1.19X90.0M (BLADE) ×2 IMPLANT
BLADE SURG SZ10 CARB STEEL (BLADE) ×4 IMPLANT
BNDG ELASTIC 3X5.8 VLCR STR LF (GAUZE/BANDAGES/DRESSINGS) ×1 IMPLANT
BNDG ELASTIC 6X5.8 VLCR STR LF (GAUZE/BANDAGES/DRESSINGS) ×2 IMPLANT
BOWL SMART MIX CTS (DISPOSABLE) ×2 IMPLANT
BSPLAT TIB 7 CMNT ROT PLAT STR (Knees) ×1 IMPLANT
CEMENT HV SMART SET (Cement) ×2 IMPLANT
COVER SURGICAL LIGHT HANDLE (MISCELLANEOUS) ×2 IMPLANT
COVER WAND RF STERILE (DRAPES) ×1 IMPLANT
CUFF TOURN SGL QUICK 34 (TOURNIQUET CUFF) ×2
CUFF TRNQT CYL 34X4.125X (TOURNIQUET CUFF) ×1 IMPLANT
DECANTER SPIKE VIAL GLASS SM (MISCELLANEOUS) ×3 IMPLANT
DERMABOND ADVANCED (GAUZE/BANDAGES/DRESSINGS) ×1
DERMABOND ADVANCED .7 DNX12 (GAUZE/BANDAGES/DRESSINGS) ×1 IMPLANT
DRAPE U-SHAPE 47X51 STRL (DRAPES) ×2 IMPLANT
DRESSING AQUACEL AG SP 3.5X10 (GAUZE/BANDAGES/DRESSINGS) ×1 IMPLANT
DRSG AQUACEL AG ADV 3.5X10 (GAUZE/BANDAGES/DRESSINGS) ×1 IMPLANT
DRSG AQUACEL AG SP 3.5X10 (GAUZE/BANDAGES/DRESSINGS) ×2
DURAPREP 26ML APPLICATOR (WOUND CARE) ×4 IMPLANT
ELECT REM PT RETURN 15FT ADLT (MISCELLANEOUS) ×2 IMPLANT
GLOVE BIO SURGEON STRL SZ 6 (GLOVE) ×2 IMPLANT
GLOVE BIOGEL PI IND STRL 6.5 (GLOVE) ×1 IMPLANT
GLOVE BIOGEL PI IND STRL 7.5 (GLOVE) ×1 IMPLANT
GLOVE BIOGEL PI IND STRL 8.5 (GLOVE) ×1 IMPLANT
GLOVE BIOGEL PI INDICATOR 6.5 (GLOVE) ×1
GLOVE BIOGEL PI INDICATOR 7.5 (GLOVE) ×1
GLOVE BIOGEL PI INDICATOR 8.5 (GLOVE)
GLOVE ECLIPSE 8.0 STRL XLNG CF (GLOVE) ×1 IMPLANT
GLOVE ORTHO TXT STRL SZ7.5 (GLOVE) ×2 IMPLANT
GOWN STRL REUS W/ TWL LRG LVL3 (GOWN DISPOSABLE) ×1 IMPLANT
GOWN STRL REUS W/TWL 2XL LVL3 (GOWN DISPOSABLE) ×2 IMPLANT
GOWN STRL REUS W/TWL LRG LVL3 (GOWN DISPOSABLE) ×4 IMPLANT
HANDPIECE INTERPULSE COAX TIP (DISPOSABLE) ×2
HOLDER FOLEY CATH W/STRAP (MISCELLANEOUS) ×1 IMPLANT
KIT TURNOVER KIT A (KITS) IMPLANT
MANIFOLD NEPTUNE II (INSTRUMENTS) ×2 IMPLANT
NDL SAFETY ECLIPSE 18X1.5 (NEEDLE) IMPLANT
NEEDLE HYPO 18GX1.5 SHARP (NEEDLE)
NS IRRIG 1000ML POUR BTL (IV SOLUTION) ×2 IMPLANT
PACK TOTAL KNEE CUSTOM (KITS) ×2 IMPLANT
PENCIL SMOKE EVACUATOR (MISCELLANEOUS) ×1 IMPLANT
PIN DRILL FIX HALF THREAD (BIT) ×1 IMPLANT
PIN FIX SIGMA LCS THRD HI (PIN) ×1 IMPLANT
PROTECTOR NERVE ULNAR (MISCELLANEOUS) ×2 IMPLANT
SET HNDPC FAN SPRY TIP SCT (DISPOSABLE) ×1 IMPLANT
SET PAD KNEE POSITIONER (MISCELLANEOUS) ×2 IMPLANT
SPONGE LAP 18X18 X RAY DECT (DISPOSABLE) ×1 IMPLANT
SUT MNCRL AB 4-0 PS2 18 (SUTURE) ×2 IMPLANT
SUT STRATAFIX PDS+ 0 24IN (SUTURE) ×2 IMPLANT
SUT VIC AB 1 CT1 36 (SUTURE) ×3 IMPLANT
SUT VIC AB 2-0 CT1 27 (SUTURE) ×6
SUT VIC AB 2-0 CT1 TAPERPNT 27 (SUTURE) ×3 IMPLANT
SYR 3ML LL SCALE MARK (SYRINGE) ×2 IMPLANT
TIBIAL BASE ROT PLAT SZ 7 KNEE (Knees) ×2 IMPLANT
TRAY FOLEY MTR SLVR 16FR STAT (SET/KITS/TRAYS/PACK) ×2 IMPLANT
WATER STERILE IRR 1000ML POUR (IV SOLUTION) ×4 IMPLANT
WRAP KNEE MAXI GEL POST OP (GAUZE/BANDAGES/DRESSINGS) ×2 IMPLANT
YANKAUER SUCT BULB TIP 10FT TU (MISCELLANEOUS) ×2 IMPLANT

## 2019-12-16 NOTE — Discharge Instructions (Addendum)

## 2019-12-16 NOTE — Evaluation (Signed)
Physical Therapy Evaluation Patient Details Name: Samuel Goodwin MRN: [id number] DOB: 01/30/1961 Today's Date: 12/16/2019   History of Present Illness  59 y.o. male admitted for Rt TKA on 12/16/19. PMH of B THA 2017, Lt TKA, BPPV, PTSD.  Clinical Impression  Samuel Goodwin is a 59 y.o. male POD 0 s/p Rt TKA. Patient reports independence with mobility at baseline. Patient is now limited by functional impairments (see PT problem list below) and requires min assist for transfers and gait with RW. Patient was able to ambulate ~25 feet with RW and min assist to steady and prevent Rt knee buckling. Patient instructed in exercise to facilitate ROM and circulation. Patient will benefit from continued skilled PT interventions to address impairments and progress towards PLOF. Acute PT will follow to progress mobility and stair training in preparation for safe discharge home.     Follow Up Recommendations Follow surgeon's recommendation for DC plan and follow-up therapies    Equipment Recommendations  None recommended by PT    Recommendations for Other Services       Precautions / Restrictions Precautions Precautions: Fall Restrictions Weight Bearing Restrictions: No      Mobility  Bed Mobility Overal bed mobility: Needs Assistance Bed Mobility: Supine to Sit     Supine to sit: Min assist;HOB elevated     General bed mobility comments: verbal cues and assist for Lt LE to assist Rt LE mobility, cues for use of bed rail and to raise up trunk. min assist to lift trunk and bring LE's off EOB fully.  Transfers Overall transfer level: Needs assistance Equipment used: Rolling walker (2 wheeled) Transfers: Sit to/from Stand Sit to Stand: +2 safety/equipment;Min assist;From elevated surface         General transfer comment: cues for safe hand placement and technique with RW, no overt LOB, min assist to perform power up and to guard Rt LE.  Ambulation/Gait Ambulation/Gait assistance: Min  assist;+2 safety/equipment Gait Distance (Feet): 25 Feet Assistive device: Rolling walker (2 wheeled) Gait Pattern/deviations: Step-to pattern;Decreased stance time - right;Decreased step length - left;Decreased stride length;Narrow base of support;Wide base of support;Antalgic Gait velocity: decreased   General Gait Details: verbal cues for safe step pattern and to maintain safe proximity to RW throughout. manual assist at Rt knee to facilitate extension and prevent buckling in stance phase. pt with good use of UE support on RW to reduce weigth bearing in Rt LE for safety and pain managment.  Stairs            Wheelchair Mobility    Modified Rankin (Stroke Patients Only)       Balance Overall balance assessment: Needs assistance Sitting-balance support: Feet supported Sitting balance-Leahy Scale: Good     Standing balance support: Bilateral upper extremity supported;During functional activity Standing balance-Leahy Scale: Poor        Pertinent Vitals/Pain Pain Assessment: 0-10 Pain Score: 7  Pain Location: Rt knee Pain Descriptors / Indicators: Aching;Sore Pain Intervention(s): Limited activity within patient's tolerance;Monitored during session;Repositioned;Ice applied    Home Living Family/patient expects to be discharged to:: Private residence Living Arrangements: Spouse/significant other;Children Available Help at Discharge: Family;Available 24 hours/day(adult son at home can help) Type of Home: House Home Access: Stairs to enter Entrance Stairs-Rails: None Entrance Stairs-Number of Steps: 2 Home Layout: One level Home Equipment: Bedside commode;Walker - 2 wheels;Cane - single point Additional Comments: pt uses small BSC In shower for seat and larger BSC over toilet to raise the height    Prior  Function Level of Independence: Independent               Hand Dominance   Dominant Hand: Right    Extremity/Trunk Assessment   Upper Extremity  Assessment Upper Extremity Assessment: Overall WFL for tasks assessed    Lower Extremity Assessment Lower Extremity Assessment: RLE deficits/detail RLE Deficits / Details: pt with limited quad activation in supine due to pain, unable to complete SLR but able to complete SAQ and LAQ, 4-/5 for strength with MMT RLE Sensation: (pt reports some tingling in Rt foot) RLE Coordination: WNL    Cervical / Trunk Assessment Cervical / Trunk Assessment: Normal  Communication   Communication: No difficulties  Cognition Arousal/Alertness: Awake/alert Behavior During Therapy: WFL for tasks assessed/performed Overall Cognitive Status: Within Functional Limits for tasks assessed             General Comments      Exercises Total Joint Exercises Ankle Circles/Pumps: AROM;10 reps;Seated;Both Quad Sets: AROM;5 reps;Seated;Right Heel Slides: AAROM;5 reps;Seated;Right   Assessment/Plan    PT Assessment Patient needs continued PT services  PT Problem List Decreased strength;Decreased activity tolerance;Decreased mobility;Decreased balance;Decreased range of motion;Decreased knowledge of use of DME       PT Treatment Interventions DME instruction;Functional mobility training;Gait training;Therapeutic activities;Therapeutic exercise;Stair training;Balance training;Patient/family education    PT Goals (Current goals can be found in the Care Plan section)  Acute Rehab PT Goals Patient Stated Goal: to get back to independence PT Goal Formulation: With patient Time For Goal Achievement: 12/23/19 Potential to Achieve Goals: Good    Frequency 7X/week    AM-PAC PT "6 Clicks" Mobility  Outcome Measure Help needed turning from your back to your side while in a flat bed without using bedrails?: A Little Help needed moving from lying on your back to sitting on the side of a flat bed without using bedrails?: A Little Help needed moving to and from a bed to a chair (including a wheelchair)?: A  Little Help needed standing up from a chair using your arms (e.g., wheelchair or bedside chair)?: A Little Help needed to walk in hospital room?: A Little Help needed climbing 3-5 steps with a railing? : A Lot 6 Click Score: 17    End of Session Equipment Utilized During Treatment: Gait belt Activity Tolerance: Patient tolerated treatment well Patient left: in chair;with call bell/phone within reach;with chair alarm set Nurse Communication: Mobility status PT Visit Diagnosis: Muscle weakness (generalized) (M62.81);Difficulty in walking, not elsewhere classified (R26.2)    Time: [id number] PT Time Calculation (min) (ACUTE ONLY): 27 min   Charges:   PT Evaluation $PT Eval Low Complexity: 1 Low PT Treatments $Therapeutic Exercise: 8-22 mins        Verner Mould, DPT Physical Therapist with [facility] [phone]  12/16/2019 4:28 PM

## 2019-12-16 NOTE — Interval H&P Note (Signed)
History and Physical Interval Note:  12/16/2019 8:32 AM  Samuel Goodwin  has presented today for surgery, with the diagnosis of Right knee osteoarthritis.  The various methods of treatment have been discussed with the patient and family. After consideration of risks, benefits and other options for treatment, the patient has consented to  Procedure(s) with comments: TOTAL KNEE ARTHROPLASTY (Right) - 70 mins as a surgical intervention.  The patient's history has been reviewed, patient examined, no change in status, stable for surgery.  I have reviewed the patient's chart and labs.  Questions were answered to the patient's satisfaction.     [name]

## 2019-12-16 NOTE — Progress Notes (Signed)
AssistedDr. Carswell Jackson with right, ultrasound guided, adductor canal block. Side rails up, monitors on throughout procedure. See vital signs in flow sheet. Tolerated Procedure well.  

## 2019-12-16 NOTE — Op Note (Signed)
NAME:  Samuel Goodwin                      MEDICAL RECORD NO.:  [id number]                             FACILITY:  [facility]      PHYSICIAN:  Pietro Cassis. Alvan Dame, M.D.  DATE OF BIRTH:  1961-09-19      DATE OF PROCEDURE:  12/16/2019                                     OPERATIVE REPORT         PREOPERATIVE DIAGNOSIS:  Right knee osteoarthritis.      POSTOPERATIVE DIAGNOSIS:  Right knee osteoarthritis.      FINDINGS:  The patient was noted to have complete loss of cartilage and   bone-on-bone arthritis with associated osteophytes in the medial and patellofemoral compartments of   the knee with a significant synovitis and associated effusion.  The patient had failed months of conservative treatment including medications, injection therapy, activity modification.     PROCEDURE:  Right total knee replacement.      COMPONENTS USED:  DePuy Attune rotating platform posterior stabilized knee   system, a size 6 femur, 7 tibia, size 5 mm PS AOX insert, and 38 anatomic patellar   button.      SURGEON:  Pietro Cassis. Alvan Dame, M.D.      ASSISTANT:  Griffith Citron, PA-C.      ANESTHESIA:  Regional and Spinal.      SPECIMENS:  None.      COMPLICATION:  None.      DRAINS:  None.  EBL: <100cc      TOURNIQUET TIME:   Total Tourniquet Time Documented: Thigh (Right) - 41 minutes Total: Thigh (Right) - 41 minutes  .      The patient was stable to the recovery room.      INDICATION FOR PROCEDURE:  Samuel Goodwin is a 59 y.o. male patient of   mine.  The patient had been seen, evaluated, and treated for months conservatively in the   office with medication, activity modification, and injections.  The patient had   radiographic changes of bone-on-bone arthritis with endplate sclerosis and osteophytes noted.  Based on the radiographic changes and failed conservative measures, the patient   decided to proceed with definitive treatment, total knee replacement.  Risks of infection, DVT, component failure, need  for revision surgery, neurovascular injury were reviewed in the office setting.  The postop course was reviewed stressing the efforts to maximize post-operative satisfaction and function.  Consent was obtained for benefit of pain   relief.      PROCEDURE IN DETAIL:  The patient was brought to the operative theater.   Once adequate anesthesia, preoperative antibiotics, 2 gm of Ancef,1 gm of Tranexamic Acid, and 10 mg of Decadron administered, the patient was positioned supine with a right thigh tourniquet placed.  The  right lower extremity was prepped and draped in sterile fashion.  A time-   out was performed identifying the patient, planned procedure, and the appropriate extremity.      The right lower extremity was placed in the [facility] leg holder.  The leg was   exsanguinated, tourniquet elevated to 250 mmHg.  A midline incision was   made  followed by median parapatellar arthrotomy.  Following initial   exposure, attention was first directed to the patella.  Precut   measurement was noted to be 25 mm.  I resected down to 14-15 mm and used a   38 anatomic patellar button to restore patellar height as well as cover the cut surface.      The lug holes were drilled and a metal shim was placed to protect the   patella from retractors and saw blade during the procedure.      At this point, attention was now directed to the femur.  The femoral   canal was opened with a drill, irrigated to try to prevent fat emboli.  An   intramedullary rod was passed at 5 degrees valgus, 9 mm of bone was   resected off the distal femur.  Following this resection, the tibia was   subluxated anteriorly.  Using the extramedullary guide, 4 mm of bone was resected off   the proximal medial tibia.  We confirmed the gap would be   stable medially and laterally with a size 5 spacer block as well as confirmed that the tibial cut was perpendicular in the coronal plane, checking with an alignment rod.      Once this was  done, I sized the femur to be a size 6 in the anterior-   posterior dimension, chose a standard component based on medial and   lateral dimension.  The size 5 rotation block was then pinned in   position anterior referenced using the C-clamp to set rotation.  The   anterior, posterior, and  chamfer cuts were made without difficulty nor   notching making certain that I was along the anterior cortex to help   with flexion gap stability.      The final box cut was made off the lateral aspect of distal femur.      At this point, the tibia was sized to be a size 7.  The size 7 tray was   then pinned in position through the medial third of the tubercle,   drilled, and keel punched.  Trial reduction was now carried with a 6 femur,  7 tibia, a size 5 mm PS insert, and the 38 anatomic patella botton.  The knee was brought to full extension with good flexion stability with the patella   tracking through the trochlea without application of pressure.  Given   all these findings the trial components removed.  Final components were   opened and cement was mixed.  The knee was irrigated with normal saline solution and pulse lavage.  The synovial lining was   then injected with 30 cc of 0.25% Marcaine with epinephrine, 1 cc of Toradol and 30 cc of NS for a total of 61 cc.     Final implants were then cemented onto cleaned and dried cut surfaces of bone with the knee brought to extension with a size 5 mm PS trial insert.      Once the cement had fully cured, excess cement was removed   throughout the knee.  I confirmed that I was satisfied with the range of   motion and stability, and the final size 5 mm PS AOX insert was chosen.  It was   placed into the knee.      The tourniquet had been let down at 41 minutes.  No significant   hemostasis was required.  The extensor mechanism was then reapproximated using #1 Vicryl and #  1 Stratafix sutures with the knee   in flexion.  The   remaining wound was closed  with 2-0 Vicryl and running 4-0 Monocryl.   The knee was cleaned, dried, dressed sterilely using Dermabond and   Aquacel dressing.  The patient was then   brought to recovery room in stable condition, tolerating the procedure   well.   Please note that Physician Assistant, Griffith Citron, PA-C was present for the entirety of the case, and was utilized for pre-operative positioning, peri-operative retractor management, general facilitation of the procedure and for primary wound closure at the end of the case.              Pietro Cassis Alvan Dame, M.D.    12/16/2019 11:25 AM

## 2019-12-16 NOTE — Anesthesia Procedure Notes (Signed)
Anesthesia Regional Block: Adductor canal block   Pre-Anesthetic Checklist: ,, timeout performed, Correct Patient, Correct Site, Correct Laterality, Correct Procedure, Correct Position, site marked, Risks and benefits discussed,  Surgical consent,  Pre-op evaluation,  At surgeon's request and post-op pain management  Laterality: Right and Lower  Prep: chloraprep       Needles:  Injection technique: Single-shot  Needle Type: Echogenic Needle     Needle Length: 9cm  Needle Gauge: 21     Additional Needles:   Procedures:,,,, ultrasound used (permanent image in chart),,,,  Narrative:  Start time: 12/16/2019 9:02 AM End time: 12/16/2019 9:08 AM Injection made incrementally with aspirations every 5 mL.  Performed by: Personally  Anesthesiologist: Annye Asa, MD  Additional Notes: Pt identified in Holding room.  Monitors applied. Working IV access confirmed. Sterile prep R thigh.  #21ga ECHOgenic needle into adductor canal with US guidance.  20cc 0.75% Ropivacaine injected incrementally after negative test dose.  Patient asymptomatic, VSS, no heme aspirated, tolerated well.  Jenita Seashore, MD

## 2019-12-16 NOTE — Anesthesia Postprocedure Evaluation (Signed)
Anesthesia Post Note  Patient: Samuel Goodwin  Procedure(s) Performed: TOTAL KNEE ARTHROPLASTY (Right Knee)     Patient location during evaluation: PACU Anesthesia Type: Spinal Level of consciousness: awake and alert, patient cooperative and oriented Pain management: pain level controlled Vital Signs Assessment: post-procedure vital signs reviewed and stable Respiratory status: spontaneous breathing, nonlabored ventilation and respiratory function stable Cardiovascular status: blood pressure returned to baseline and stable Postop Assessment: no apparent nausea or vomiting and spinal receding Anesthetic complications: no    Last Vitals:  Vitals:   12/16/19 1330 12/16/19 1350  BP: 135/85 137/89  Pulse: 67 69  Resp: 14 15  Temp:  36.6 C  SpO2: 99% 100%    Last Pain:  Vitals:   12/16/19 1350  TempSrc: Oral  PainSc: 2                  [name],E. Verdia Bolt

## 2019-12-16 NOTE — Transfer of Care (Signed)
Immediate Anesthesia Transfer of Care Note  Patient: Samuel Goodwin  Procedure(s) Performed: Procedure(s) with comments: TOTAL KNEE ARTHROPLASTY (Right) - 70 mins  Patient Location: PACU  Anesthesia Type:Spinal  Level of Consciousness:  sedated, patient cooperative and responds to stimulation  Airway & Oxygen Therapy:Patient Spontanous Breathing and Patient connected to face mask oxgen  Post-op Assessment:  Report given to PACU RN and Post -op Vital signs reviewed and stable  Post vital signs:  Reviewed and stable  Last Vitals:  Vitals:   12/16/19 0916 12/16/19 0918  BP:    Pulse: 70 70  Resp: 11 13  Temp:    SpO2: [id number] [id number]    Complications: No apparent anesthesia complications

## 2019-12-16 NOTE — Anesthesia Procedure Notes (Signed)
Spinal  Patient location during procedure: OR End time: 12/16/2019 9:53 AM Staffing Performed: anesthesiologist  Anesthesiologist: Annye Asa, MD Preanesthetic Checklist Completed: patient identified, IV checked, site marked, risks and benefits discussed, surgical consent, monitors and equipment checked, pre-op evaluation and timeout performed Spinal Block Patient position: sitting Prep: DuraPrep and site prepped and draped Patient monitoring: blood pressure, continuous pulse ox, cardiac monitor and heart rate Approach: midline Location: L3-4 Injection technique: single-shot Needle Needle type: Pencan  Needle gauge: 24 G Needle length: 9 cm Additional Notes Pt identified in Operating room.  Monitors applied. Working IV access confirmed. Sterile prep, drape lumbar spine.  1% lido local L 3,4.  #24ga Pencan into clear CSF L 3,4.  15mg  0.75% Bupivacaine with dextrose injected with asp CSF beginning and end of injection.  Patient asymptomatic, VSS, no heme aspirated, tolerated well.  Jenita Seashore, MD

## 2019-12-17 ENCOUNTER — Encounter: Payer: Self-pay | Admitting: *Deleted

## 2019-12-17 DIAGNOSIS — M1711 Unilateral primary osteoarthritis, right knee: Secondary | ICD-10-CM | POA: Diagnosis not present

## 2019-12-17 LAB — CBC
HCT: 34.8 % — ABNORMAL LOW (ref 39.0–52.0)
Hemoglobin: 12 g/dL — ABNORMAL LOW (ref 13.0–17.0)
MCH: 30.8 pg (ref 26.0–34.0)
MCHC: 34.5 g/dL (ref 30.0–36.0)
MCV: 89.5 fL (ref 80.0–100.0)
Platelets: 201 10*3/uL (ref 150–400)
RBC: 3.89 MIL/uL — ABNORMAL LOW (ref 4.22–5.81)
RDW: 12.2 % (ref 11.5–15.5)
WBC: 14.5 10*3/uL — ABNORMAL HIGH (ref 4.0–10.5)
nRBC: 0 % (ref 0.0–0.2)

## 2019-12-17 LAB — BASIC METABOLIC PANEL
Anion gap: 10 (ref 5–15)
BUN: 17 mg/dL (ref 6–20)
CO2: 26 mmol/L (ref 22–32)
Calcium: 8.2 mg/dL — ABNORMAL LOW (ref 8.9–10.3)
Chloride: 101 mmol/L (ref 98–111)
Creatinine, Ser: 0.68 mg/dL (ref 0.61–1.24)
GFR calc Af Amer: >60 mL/min (ref >60)
GFR calc non Af Amer: >60 mL/min (ref >60)
Glucose, Bld: 149 mg/dL — ABNORMAL HIGH (ref 70–99)
Potassium: 4 mmol/L (ref 3.5–5.1)
Sodium: 137 mmol/L (ref 135–145)

## 2019-12-17 MED ORDER — DICLOFENAC SODIUM 1 % EX GEL
1.0000 "application " | Freq: Every day | CUTANEOUS | Status: DC
  Administered 2019-12-17: 1 via TOPICAL
  Filled 2019-12-17: qty 100

## 2019-12-17 NOTE — Plan of Care (Signed)
  Problem: Education: Goal: Knowledge of the prescribed therapeutic regimen will improve 12/17/2019 1110 by Breton Berns, Helane Gunther, RN Outcome: Adequate for Discharge 12/17/2019 0804 by Deetta Perla, RN Outcome: Progressing Goal: Individualized Educational Video(s) 12/17/2019 1110 by Deetta Perla, RN Outcome: Adequate for Discharge 12/17/2019 0804 by Deetta Perla, RN Outcome: Progressing   Problem: Activity: Goal: Ability to avoid complications of mobility impairment will improve 12/17/2019 1110 by Rita Vialpando, Helane Gunther, RN Outcome: Adequate for Discharge 12/17/2019 0804 by Deetta Perla, RN Outcome: Progressing Goal: Range of joint motion will improve 12/17/2019 1110 by Imelda Dandridge, Helane Gunther, RN Outcome: Adequate for Discharge 12/17/2019 0804 by Deetta Perla, RN Outcome: Progressing   Problem: Clinical Measurements: Goal: Postoperative complications will be avoided or minimized 12/17/2019 1110 by Mohd. Derflinger, Helane Gunther, RN Outcome: Adequate for Discharge 12/17/2019 0804 by Deetta Perla, RN Outcome: Progressing   Problem: Pain Management: Goal: Pain level will decrease with appropriate interventions 12/17/2019 1110 by Deetta Perla, RN Outcome: Adequate for Discharge 12/17/2019 0804 by Deetta Perla, RN Outcome: Progressing   Problem: Skin Integrity: Goal: Will show signs of wound healing 12/17/2019 1110 by Deetta Perla, RN Outcome: Adequate for Discharge 12/17/2019 0804 by Deetta Perla, RN Outcome: Progressing   Problem: Skin Integrity: Goal: Will show signs of wound healing 12/17/2019 1110 by Deetta Perla, RN Outcome: Adequate for Discharge 12/17/2019 0804 by Deetta Perla, RN Outcome: Progressing

## 2019-12-17 NOTE — Plan of Care (Signed)

## 2019-12-17 NOTE — Progress Notes (Signed)
Subjective: 1 Day Post-Op Procedure(s) (LRB): TOTAL KNEE ARTHROPLASTY (Right) Patient reports pain as mild.   Patient seen in rounds for Dr. Alvan Dame. Patient is well, and has had no acute complaints or problems other than discomfort in the right knee. No acute events overnight. Foley catheter removed, positive flatus. Ambulated 25 feet with PT yesterday. Denies N/V, CP, SHOB. Patient states he is ready to go home today. We will continue therapy today.   Objective: Vital signs in last 24 hours: Temp:  [97.5 F (36.4 C)-98.3 F (36.8 C)] 97.9 F (36.6 C) (01/06 0903) Pulse Rate:  [67-84] 84 (01/06 0903) Resp:  [12-18] 16 (01/06 0903) BP: (133-171)/(76-109) 146/76 (01/06 0903) SpO2:  [95 %-100 %] 99 % (01/06 0903)  Intake/Output from previous day:  Intake/Output Summary (Last 24 hours) at 12/17/2019 1011 Last data filed at 12/17/2019 0939 Gross per 24 hour  Intake 3742.9 ml  Output 1955 ml  Net 1787.9 ml     Intake/Output this shift: Total I/O In: 240 [P.O.:240] Out: 225 [Urine:225]  Labs: Recent Labs    12/17/19 0426  HGB 12.0*   Recent Labs    12/17/19 0426  WBC 14.5*  RBC 3.89*  HCT 34.8*  PLT 201   Recent Labs    12/17/19 0426  NA 137  K 4.0  CL 101  CO2 26  BUN 17  CREATININE 0.68  GLUCOSE 149*  CALCIUM 8.2*   No results for input(s): LABPT, INR in the last 72 hours.  Exam: General - Patient is Alert and Oriented Extremity - Neurologically intact Sensation intact distally Intact pulses distally Dorsiflexion/Plantar flexion intact Dressing - dressing C/D/I Motor Function - intact, moving foot and toes well on exam.   Past Medical History:  Diagnosis Date  . Alcohol abuse    Sobriety since 1990  . Anxiety   . Arthritis    oa  . Benign positional vertigo   . Depression   . Eardrum trauma    30 years ago/right ear  . GERD (gastroesophageal reflux disease)   . Headache    sinus  . Hepatitis C 1994   hep c - ? etiology; in [place] 1989-91;  S/P interferon , Dr [name]  . History of bronchitis   . History of iron deficiency anemia   . Hoarseness   . Hyperlipidemia   . Hypertension   . Hypothyroidism   . Obesity   . OSA (obstructive sleep apnea)    on CPAP  . PONV (postoperative nausea and vomiting)    following knee surgery last year   . PTSD (post-traumatic stress disorder)   . PTSD (post-traumatic stress disorder)   . 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 Vocal cord polyps     Assessment/Plan: 1 Day Post-Op Procedure(s) (LRB): TOTAL KNEE ARTHROPLASTY (Right) Principal Problem:   S/P right TKA Active Problems:   Status post total knee replacement, right  Estimated body mass index is 38.57 kg/m as calculated from the following:   Height as of this encounter: 6' (1.829 m).   Weight as of this encounter: 129 kg. Advance diet Up with therapy D/C IV fluids   Patient's anticipated LOS is less than 2 midnights, meeting these requirements: - Younger than 15 - Lives within 1 hour of care - Has a competent adult at home to recover with post-op recover - NO history of  - Chronic pain requiring opiods  - Diabetes  - Coronary Artery Disease  -  Heart failure  - Heart attack  - Stroke  - DVT/VTE  - Cardiac arrhythmia  - Respiratory Failure/COPD  - Renal failure  - Anemia  - Advanced Liver disease  DVT Prophylaxis - Aspirin Weight bearing as tolerated. D/C O2 and pulse ox and try on room air.  Plan is to go Home after hospital stay. Plan for discharge today following 1-2 sessions of therapy as long as he continues to meet his goals. Scheduled for OPPT on Monday. Follow up in the office in 2 weeks.   Griffith Citron, PA-C Orthopedic Surgery [phone] 12/17/2019, 10:11 AM

## 2019-12-17 NOTE — Progress Notes (Signed)
Physical Therapy Treatment Patient Details Name: Samuel Goodwin MRN: [id number] DOB: 1961-09-30 Today's Date: 12/17/2019    History of Present Illness 59 y.o. male admitted for Rt TKA on 12/16/19. PMH of B THA 2017, Lt TKA, BPPV, PTSD.    PT Comments    Pt ambulated in hallway and performed LE exercises.  Pt reports his daughter is a PT and will be assisting him into house.  Pt has one step and able to verbally describe safe technique (pt did not feel he needed to practice prior to d/c).  Pt provided with HEP handout and had no further questions.  Pt ready for d/c home today.   Follow Up Recommendations  Follow surgeon's recommendation for DC plan and follow-up therapies     Equipment Recommendations  None recommended by PT    Recommendations for Other Services       Precautions / Restrictions Precautions Precautions: Fall;Knee Restrictions Weight Bearing Restrictions: No    Mobility  Bed Mobility Overal bed mobility: Needs Assistance Bed Mobility: Supine to Sit     Supine to sit: Min assist;HOB elevated     General bed mobility comments: verbal cues and assist for Lt LE to assist Rt LE mobility, pt unable to self assist so assisted with R LE over EOB  Transfers Overall transfer level: Needs assistance Equipment used: Rolling walker (2 wheeled) Transfers: Sit to/from Stand Sit to Stand: Min guard         General transfer comment: pt able to recall safe technique with RW, min/guard for safety, effortful transition  Ambulation/Gait Ambulation/Gait assistance: Min guard Gait Distance (Feet): 80 Feet Assistive device: Rolling walker (2 wheeled) Gait Pattern/deviations: Step-to pattern;Decreased stance time - right;Antalgic Gait velocity: decreased   General Gait Details: verbal cues for sequence, RW positioning, heel strike, slow but steady gait   Stairs             Wheelchair Mobility    Modified Rankin (Stroke Patients Only)       Balance                                             Cognition Arousal/Alertness: Awake/alert Behavior During Therapy: WFL for tasks assessed/performed Overall Cognitive Status: Within Functional Limits for tasks assessed                                        Exercises Total Joint Exercises Ankle Circles/Pumps: AROM;10 reps;Both Quad Sets: AROM;10 reps;Right Short Arc QuadSinclair Ship;Right;10 reps Heel Slides: AAROM;10 reps;Right;Seated Hip ABduction/ADduction: AAROM;Right;10 reps Straight Leg Raises: AAROM;Right;10 reps    General Comments        Pertinent Vitals/Pain Pain Assessment: 0-10 Pain Score: 5  Pain Location: Rt knee Pain Descriptors / Indicators: Aching;Sore Pain Intervention(s): Monitored during session;Repositioned;Premedicated before session;Ice applied    Home Living                      Prior Function            PT Goals (current goals can now be found in the care plan section) Progress towards PT goals: Progressing toward goals    Frequency    7X/week      PT Plan Current plan remains appropriate    Co-evaluation  AM-PAC PT "6 Clicks" Mobility   Outcome Measure  Help needed turning from your back to your side while in a flat bed without using bedrails?: A Little Help needed moving from lying on your back to sitting on the side of a flat bed without using bedrails?: A Little Help needed moving to and from a bed to a chair (including a wheelchair)?: A Little Help needed standing up from a chair using your arms (e.g., wheelchair or bedside chair)?: A Little Help needed to walk in hospital room?: A Little Help needed climbing 3-5 steps with a railing? : A Little 6 Click Score: 18    End of Session Equipment Utilized During Treatment: Gait belt Activity Tolerance: Patient tolerated treatment well Patient left: in chair;with call bell/phone within reach Nurse Communication: Mobility status PT  Visit Diagnosis: Muscle weakness (generalized) (M62.81);Difficulty in walking, not elsewhere classified (R26.2)     Time: 1000-1028 PT Time Calculation (min) (ACUTE ONLY): 28 min  Charges:  $Gait Training: 8-22 mins $Therapeutic Exercise: 8-22 mins                    Arlyce Dice, DPT Acute Rehabilitation Services Office: [phone]   York Ram E 12/17/2019, 12:11 PM

## 2019-12-22 NOTE — Discharge Summary (Signed)
Physician Discharge Summary   Patient ID: Samuel Goodwin MRN: [id number] DOB/AGE: [birth year] 59 y.o.  Admit date: 12/16/2019 Discharge date: 12/17/2019  Primary Diagnosis:  Right knee osteoarthritis.   Admission Diagnoses:  Past Medical History:  Diagnosis Date  . Alcohol abuse    Sobriety since 1990  . Anxiety   . Arthritis    oa  . Benign positional vertigo   . Depression   . Eardrum trauma    30 years ago/right ear  . GERD (gastroesophageal reflux disease)   . Headache    sinus  . Hepatitis C 1994   hep c - ? etiology; in [place] 1989-91; S/P interferon , Dr [name]  . History of bronchitis   . History of iron deficiency anemia   . Hoarseness   . Hyperlipidemia   . Hypertension   . Hypothyroidism   . Obesity   . OSA (obstructive sleep apnea)    on CPAP  . PONV (postoperative nausea and vomiting)    following knee surgery last year   . PTSD (post-traumatic stress disorder)   . PTSD (post-traumatic stress disorder)   . 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 Vocal cord polyps    Discharge Diagnoses:   Principal Problem:   S/P right TKA Active Problems:   Status post total knee replacement, right  Estimated body mass index is 38.57 kg/m as calculated from the following:   Height as of this encounter: 6' (1.829 m).   Weight as of this encounter: 129 kg.  Procedure:  Procedure(s) (LRB): TOTAL KNEE ARTHROPLASTY (Right)   Consults: None  HPI: Samuel Goodwin is a 59 y.o. male patient of   mine.  The patient had been seen, evaluated, and treated for months conservatively in the office with medication, activity modification, and injections.  The patient had   radiographic changes of bone-on-bone arthritis with endplate sclerosis and osteophytes noted.  Based on the radiographic changes and failed conservative measures, the patient decided to proceed with definitive treatment, total knee replacement.  Risks of infection,  DVT, component failure, need for revision surgery, neurovascular injury were reviewed in the office setting.  The postop course was reviewed stressing the efforts to maximize post-operative satisfaction and function.  Consent was obtained for benefit of pain   relief.   Laboratory Data: Admission on 12/16/2019, Discharged on 12/17/2019  Component Date Value Ref Range Status  . WBC 12/17/2019 14.5* 4.0 - 10.5 K/uL Final  . RBC 12/17/2019 3.89* 4.22 - 5.81 MIL/uL Final  . Hemoglobin 12/17/2019 12.0* 13.0 - 17.0 g/dL Final  . HCT 12/17/2019 34.8* 39.0 - 52.0 % Final  . MCV 12/17/2019 89.5  80.0 - 100.0 fL Final  . MCH 12/17/2019 30.8  26.0 - 34.0 pg Final  . MCHC 12/17/2019 34.5  30.0 - 36.0 g/dL Final  . RDW 12/17/2019 12.2  11.5 - 15.5 % Final  . Platelets 12/17/2019 201  150 - 400 K/uL Final  . nRBC 12/17/2019 0.0  0.0 - 0.2 % Final   Performed at [facility], [place] [address] [place], [place] [address]  . Sodium 12/17/2019 137  135 - 145 mmol/L Final  . Potassium 12/17/2019 4.0  3.5 - 5.1 mmol/L Final  . Chloride 12/17/2019 101  98 - 111 mmol/L Final  . CO2 12/17/2019 26  22 - 32 mmol/L Final  . Glucose, Bld 12/17/2019 149* 70 - 99 mg/dL Final  . BUN 12/17/2019 17  6 - 20 mg/dL Final  . Creatinine, Ser 12/17/2019 0.68  0.61 - 1.24 mg/dL Final  . Calcium 12/17/2019 8.2* 8.9 - 10.3 mg/dL Final  . GFR calc non Af Amer 12/17/2019 >60  >60 mL/min Final  . GFR calc Af Amer 12/17/2019 >60  >60 mL/min Final  . Anion gap 12/17/2019 10  5 - 15 Final   Performed at [facility], [place] [address], [place], [place] [address]  Hospital Outpatient Visit on 12/13/2019  Component Date Value Ref Range Status  . SARS Coronavirus 2 12/13/2019 NEGATIVE  NEGATIVE Final   Comment: (NOTE) SARS-CoV-2 target nucleic acids are NOT DETECTED. The SARS-CoV-2 RNA is generally detectable in upper and lower respiratory specimens during the acute phase of infection.  Negative results do not preclude SARS-CoV-2 infection, do not rule out co-infections with other pathogens, and should not be used as the sole basis for treatment or other patient management decisions. Negative results must be combined with clinical observations, patient history, and epidemiological information. The expected result is Negative. Fact Sheet for Patients: SugarRoll.be Fact Sheet for Healthcare Providers: https://www.woods-mathews.com/ This test is not yet approved or cleared by the [place] FDA and  has been authorized for detection and/or diagnosis of SARS-CoV-2 by FDA under an Emergency Use Authorization (EUA). This EUA will remain  in effect (meaning this test can be used) for the duration of the COVID-19 declaration under Section 56                          4(b)(1) of the Act, 21 U.S.C. section 360bbb-3(b)(1), unless the authorization is terminated or revoked sooner. Performed at [facility], [place] [address] [place], [place] [address]   Hospital Outpatient Visit on 12/09/2019  Component Date Value Ref Range Status  . Sodium 12/09/2019 138  135 - 145 mmol/L Final  . Potassium 12/09/2019 4.5  3.5 - 5.1 mmol/L Final  . Chloride 12/09/2019 102  98 - 111 mmol/L Final  . CO2 12/09/2019 28  22 - 32 mmol/L Final  . Glucose, Bld 12/09/2019 108* 70 - 99 mg/dL Final  . BUN 12/09/2019 13  6 - 20 mg/dL Final  . Creatinine, Ser 12/09/2019 0.70  0.61 - 1.24 mg/dL Final  . Calcium 12/09/2019 9.0  8.9 - 10.3 mg/dL Final  . GFR calc non Af Amer 12/09/2019 >60  >60 mL/min Final  . GFR calc Af Amer 12/09/2019 >60  >60 mL/min Final  . Anion gap 12/09/2019 8  5 - 15 Final   Performed at [facility], [place] [address], [place], [place] [address]  . WBC 12/09/2019 5.2  4.0 - 10.5 K/uL Final  . RBC 12/09/2019 4.45  4.22 - 5.81 MIL/uL Final  . Hemoglobin 12/09/2019 13.6  13.0 - 17.0 g/dL Final  . HCT 12/09/2019 40.0   39.0 - 52.0 % Final  . MCV 12/09/2019 89.9  80.0 - 100.0 fL Final  . MCH 12/09/2019 30.6  26.0 - 34.0 pg Final  . MCHC 12/09/2019 34.0  30.0 - 36.0 g/dL Final  . RDW 12/09/2019 12.5  11.5 - 15.5 % Final  . Platelets 12/09/2019 191  150 - 400 K/uL Final  . nRBC 12/09/2019 0.0  0.0 - 0.2 % Final   Performed at [facility], [place] [address] [place], [place] [address]  . ABO/RH(D) 12/09/2019 O POS   Final  . Antibody Screen 12/09/2019 NEG   Final  .  Sample Expiration 12/09/2019 12/19/2019,2359   Final  . Extend sample reason 12/09/2019    Final                   Value:NO TRANSFUSIONS OR PREGNANCY IN THE PAST 3 MONTHS Performed at [facility], [place] [address], [place], [place] [address]   . MRSA, PCR 12/09/2019 NEGATIVE  NEGATIVE Final  . Staphylococcus aureus 12/09/2019 POSITIVE* NEGATIVE Final   Comment: (NOTE) The Xpert SA Assay (FDA approved for NASAL specimens in patients 71 years of age and older), is one component of a comprehensive surveillance program. It is not intended to diagnose infection nor to guide or monitor treatment. Performed at [facility], [place] [address], [place],  [address]      X-Rays:No results found.  EKG: Orders placed or performed in visit on 10/24/19  . EKG 12-Lead     Hospital Course: ABDULLAHI CARTEN is a 59 y.o. who was admitted to [facility]. They were brought to the operating room on 12/16/2019 and underwent Procedure(s): TOTAL KNEE ARTHROPLASTY.  Patient tolerated the procedure well and was later transferred to the recovery room and then to the orthopaedic floor for postoperative care. They were given PO and IV analgesics for pain control following their surgery. They were given 24 hours of postoperative antibiotics of  Anti-infectives (From admission, onward)   Start     Dose/Rate Route Frequency Ordered Stop   12/16/19 1600  ceFAZolin (ANCEF) IVPB 2g/100 mL premix     2 g 200  mL/hr over 30 Minutes Intravenous Every 6 hours 12/16/19 1353 12/16/19 2136   12/16/19 1600  ceFAZolin (ANCEF) IVPB 2g/100 mL premix  Status:  Discontinued     2 g 200 mL/hr over 30 Minutes Intravenous Every 6 hours 12/16/19 1353 12/16/19 1437   12/16/19 0600  ceFAZolin (ANCEF) 3 g in dextrose 5 % 50 mL IVPB     3 g 100 mL/hr over 30 Minutes Intravenous On call to O.R. 12/15/19 [id number] 12/16/19 0951     and started on DVT prophylaxis in the form of Aspirin.   PT and OT were ordered for total joint protocol. Discharge planning consulted to help with postop disposition and equipment needs.  Patient had a good night on the evening of surgery. They started to get up OOB with therapy on POD #0. Pt was seen during rounds and was ready to go home pending progress with therapy.  He worked with therapy on POD #1 and was meeting his goals. Pt was discharged to home later that day in stable condition.  Diet: Regular diet Activity: WBAT Follow-up: in 2 weeks Disposition: Home Discharged Condition: good   Discharge Instructions    Call MD / Call 911   Complete by: As directed    If you experience chest pain or shortness of breath, CALL 911 and be transported to the hospital emergency room.  If you develope a fever above 101 F, pus (white drainage) or increased drainage or redness at the wound, or calf pain, call your surgeon's office.   Call MD / Call 911   Complete by: As directed    If you experience chest pain or shortness of breath, CALL 911 and be transported to the hospital emergency room.  If you develope a fever above 101 F, pus (white drainage) or increased drainage or redness at the wound, or calf pain, call your surgeon's office.   Change dressing   Complete by: As  directed    Maintain surgical dressing until follow up in the clinic. If the edges start to pull up, may reinforce with tape. If the dressing is no longer working, may remove and cover with gauze and tape, but must keep the area dry  and clean.  Call with any questions or concerns.   Change dressing   Complete by: As directed    Maintain surgical dressing until follow up in the clinic. If the edges start to pull up, may reinforce with tape. If the dressing is no longer working, may remove and cover with gauze and tape, but must keep the area dry and clean.  Call with any questions or concerns.   Constipation Prevention   Complete by: As directed    Drink plenty of fluids.  Prune juice may be helpful.  You may use a stool softener, such as Colace (over the counter) 100 mg twice a day.  Use MiraLax (over the counter) for constipation as needed.   Constipation Prevention   Complete by: As directed    Drink plenty of fluids.  Prune juice may be helpful.  You may use a stool softener, such as Colace (over the counter) 100 mg twice a day.  Use MiraLax (over the counter) for constipation as needed.   Diet - low sodium heart healthy   Complete by: As directed    Diet - low sodium heart healthy   Complete by: As directed    Discharge instructions   Complete by: As directed    Maintain surgical dressing until follow up in the clinic. If the edges start to pull up, may reinforce with tape. If the dressing is no longer working, may remove and cover with gauze and tape, but must keep the area dry and clean.  Follow up in 2 weeks at [facility]. Call with any questions or concerns.   Discharge instructions   Complete by: As directed    Maintain surgical dressing until follow up in the clinic. If the edges start to pull up, may reinforce with tape. If the dressing is no longer working, may remove and cover with gauze and tape, but must keep the area dry and clean.  Follow up in 2 weeks at [facility]. Call with any questions or concerns.   Increase activity slowly as tolerated   Complete by: As directed    Weight bearing as tolerated with assist device (walker, cane, etc) as directed, use it as long as suggested by  your surgeon or therapist, typically at least 4-6 weeks.   Increase activity slowly as tolerated   Complete by: As directed    Weight bearing as tolerated with assist device (walker, cane, etc) as directed, use it as long as suggested by your surgeon or therapist, typically at least 4-6 weeks.   TED hose   Complete by: As directed    Use stockings (TED hose) for 2 weeks on both leg(s).  You may remove them at night for sleeping.   TED hose   Complete by: As directed    Use stockings (TED hose) for 2 weeks on both leg(s).  You may remove them at night for sleeping.     Allergies as of 12/17/2019      Reactions   Dilaudid [hydromorphone Hcl] Nausea And Vomiting   "Questionable as to whether it was actually dilaudid  That made me so severely nauseous and vomit"   Benazepril Hcl Cough      Medication List  STOP taking these medications   diclofenac Sodium 1 % Gel Commonly known as: VOLTAREN     TAKE these medications   aspirin 81 MG chewable tablet Commonly known as: Aspirin Childrens Chew 1 tablet (81 mg total) by mouth 2 (two) times daily. Take for 4 weeks, then resume regular dose.   b complex vitamins tablet Take 2 tablets by mouth daily.   celecoxib 200 MG capsule Commonly known as: CeleBREX Take 1 capsule (200 mg total) by mouth 2 (two) times daily. What changed: when to take this   docusate sodium 100 MG capsule Commonly known as: Colace Take 1 capsule (100 mg total) by mouth 2 (two) times daily.   ferrous sulfate 325 (65 FE) MG tablet Commonly known as: FerrouSul Take 1 tablet (325 mg total) by mouth 3 (three) times daily with meals for 14 days.   fluticasone 50 MCG/ACT nasal spray Commonly known as: FLONASE Place 1 spray into both nostrils daily as needed for allergies.   hydrochlorothiazide 25 MG tablet Commonly known as: HYDRODIURIL Take 1 tablet (25 mg total) by mouth daily.   HYDROcodone-acetaminophen 7.5-325 MG tablet Commonly known as: Norco Take  1-2 tablets by mouth every 4 (four) hours as needed for moderate pain.   levothyroxine 125 MCG tablet Commonly known as: SYNTHROID Take 250 mcg by mouth daily before breakfast.   Livalo 2 MG Tabs Generic drug: Pitavastatin Calcium Take 2 mg by mouth at bedtime.   losartan 50 MG tablet Commonly known as: COZAAR Take 50 mg by mouth daily.   methocarbamol 500 MG tablet Commonly known as: Robaxin Take 1 tablet (500 mg total) by mouth every 6 (six) hours as needed for muscle spasms.   multivitamin tablet Take 1 tablet by mouth every morning.   polyethylene glycol 17 g packet Commonly known as: MIRALAX / GLYCOLAX Take 17 g by mouth 2 (two) times daily.   vitamin C 1000 MG tablet Take 1,000 mg by mouth daily.            Discharge Care Instructions  (From admission, onward)         Start     Ordered   12/17/19 0000  Change dressing    Comments: Maintain surgical dressing until follow up in the clinic. If the edges start to pull up, may reinforce with tape. If the dressing is no longer working, may remove and cover with gauze and tape, but must keep the area dry and clean.  Call with any questions or concerns.   12/17/19 1023   12/16/19 0000  Change dressing    Comments: Maintain surgical dressing until follow up in the clinic. If the edges start to pull up, may reinforce with tape. If the dressing is no longer working, may remove and cover with gauze and tape, but must keep the area dry and clean.  Call with any questions or concerns.   12/16/19 0825         Follow-up Information    Paralee Cancel, MD. Schedule an appointment as soon as possible for a visit in 2 weeks.   Specialty: Orthopedic Surgery Contact information: [address] [place] [address] [phone]           Signed: Griffith Citron, PA-C Orthopedic Surgery 12/22/2019, 12:07 PM

## 2020-01-11 IMAGING — DX DG CHEST 1V PORT
1 series · 2 of 2 positions shown · non-contrast
Comparison: Radiographs July 05, 2013.

CLINICAL DATA: Preop for hip surgery.

EXAM:
PORTABLE CHEST 1 VIEW

[Series 1: chest ap · 0.14mm/px · 2 of 2 slices shown]
[im 1/2]
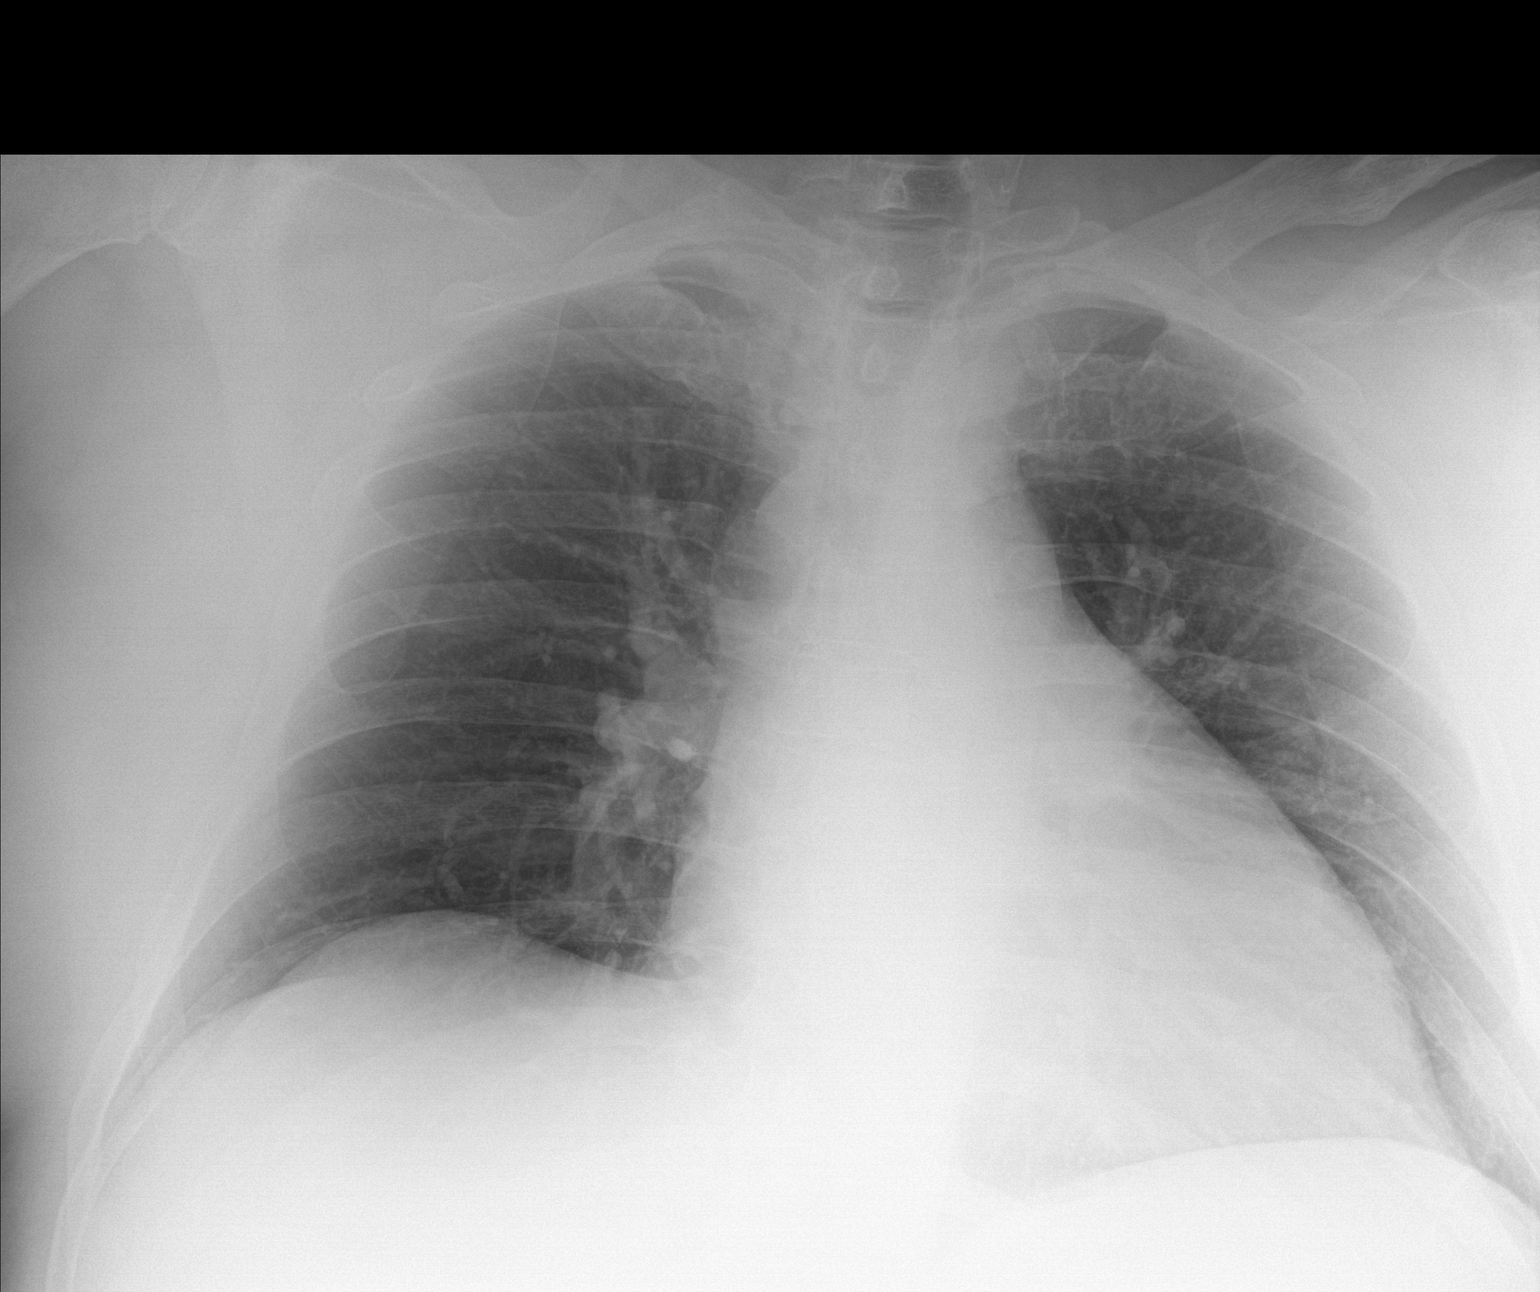
[im 2/2]
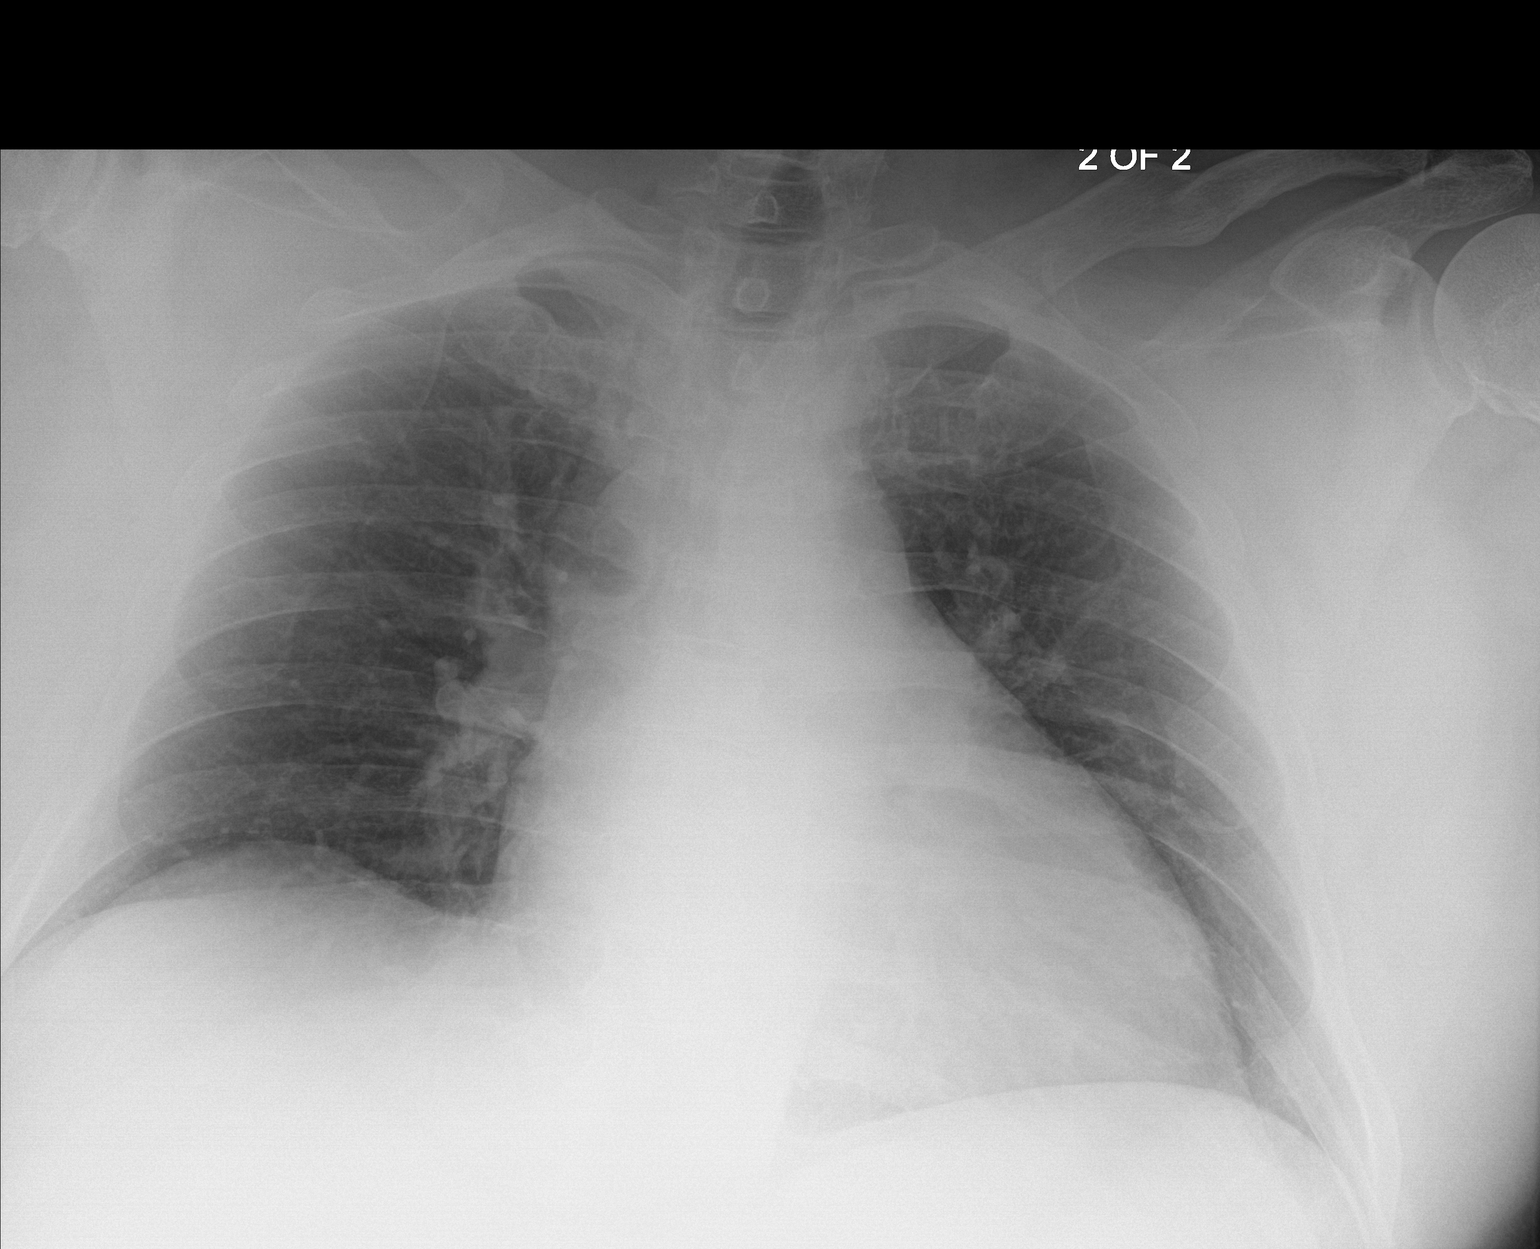

[2 of 2 positions shown; findings below may reference images not displayed]

FINDINGS: Stable cardiomegaly.. Both lungs are clear. The visualized skeletal
structures are unremarkable.
IMPRESSION: No active disease.

## 2020-01-17 IMAGING — DX DG HIP (WITH OR WITHOUT PELVIS) 1V PORT*R*
1 series · 1 of 1 positions shown · non-contrast
Comparison: Hip radiograph 11/05/2018

CLINICAL DATA: Intraop right hip revision

EXAM:
DG HIP (WITH OR WITHOUT PELVIS) 1V PORT RIGHT

[pelvis ap]
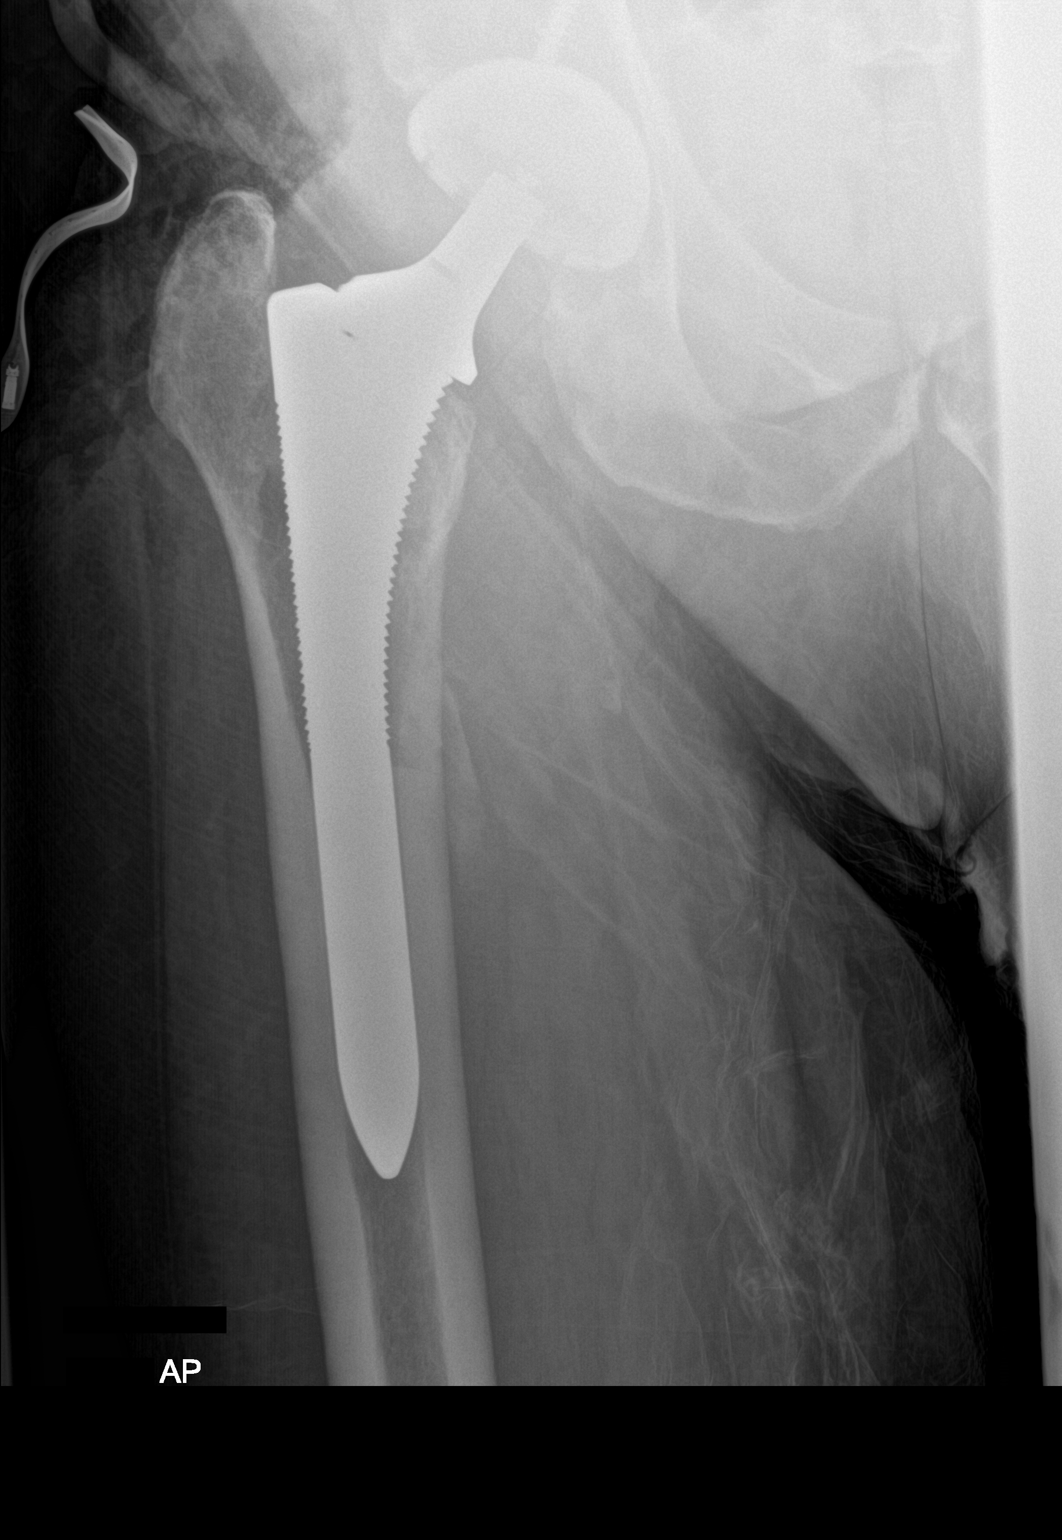

[1 of 1 positions shown; findings below may reference images not displayed]

FINDINGS: Single portable AP radiograph demonstrates replacement of the right
hip hardware. No definite displaced fracture on current exam.
Suspected drain projecting over the right superior thigh soft
tissues.
IMPRESSION: Patient status post right hip arthroplasty.

## 2020-08-23 ENCOUNTER — Emergency Department (HOSPITAL_COMMUNITY)
Admission: EM | Admit: 2020-08-23 | Discharge: 2020-08-23 | Disposition: A | Discharge status: Home / Self Care | Payer: No Typology Code available for payment source | Attending: Emergency Medicine | Admitting: Emergency Medicine

## 2020-08-23 ENCOUNTER — Other Ambulatory Visit: Payer: Self-pay

## 2020-08-23 ENCOUNTER — Encounter (HOSPITAL_COMMUNITY): Payer: Self-pay

## 2020-08-23 DIAGNOSIS — U071 COVID-19: Secondary | ICD-10-CM

## 2020-08-23 DIAGNOSIS — Z96651 Presence of right artificial knee joint: Secondary | ICD-10-CM | POA: Insufficient documentation

## 2020-08-23 DIAGNOSIS — Z96643 Presence of artificial hip joint, bilateral: Secondary | ICD-10-CM | POA: Insufficient documentation

## 2020-08-23 DIAGNOSIS — I10 Essential (primary) hypertension: Secondary | ICD-10-CM | POA: Diagnosis not present

## 2020-08-23 DIAGNOSIS — E039 Hypothyroidism, unspecified: Secondary | ICD-10-CM | POA: Insufficient documentation

## 2020-08-23 LAB — BASIC METABOLIC PANEL
Anion gap: 14 (ref 5–15)
BUN: 12 mg/dL (ref 6–20)
CO2: 26 mmol/L (ref 22–32)
Calcium: 9 mg/dL (ref 8.9–10.3)
Chloride: 101 mmol/L (ref 98–111)
Creatinine, Ser: 0.63 mg/dL (ref 0.61–1.24)
GFR calc Af Amer: >60 mL/min (ref >60)
GFR calc non Af Amer: >60 mL/min (ref >60)
Glucose, Bld: 91 mg/dL (ref 70–99)
Potassium: 3.7 mmol/L (ref 3.5–5.1)
Sodium: 141 mmol/L (ref 135–145)

## 2020-08-23 LAB — CBC WITH DIFFERENTIAL/PLATELET
Abs Immature Granulocytes: 0.03 10*3/uL (ref 0.00–0.07)
Basophils Absolute: 0 10*3/uL (ref 0.0–0.1)
Basophils Relative: 0 %
Eosinophils Absolute: 0.1 10*3/uL (ref 0.0–0.5)
Eosinophils Relative: 1 %
HCT: 38.4 % — ABNORMAL LOW (ref 39.0–52.0)
Hemoglobin: 13.3 g/dL (ref 13.0–17.0)
Immature Granulocytes: 1 %
Lymphocytes Relative: 21 %
Lymphs Abs: 1.2 10*3/uL (ref 0.7–4.0)
MCH: 29.3 pg (ref 26.0–34.0)
MCHC: 34.6 g/dL (ref 30.0–36.0)
MCV: 84.6 fL (ref 80.0–100.0)
Monocytes Absolute: 0.4 10*3/uL (ref 0.1–1.0)
Monocytes Relative: 8 %
Neutro Abs: 4 10*3/uL (ref 1.7–7.7)
Neutrophils Relative %: 69 %
Platelets: 214 10*3/uL (ref 150–400)
RBC: 4.54 MIL/uL (ref 4.22–5.81)
RDW: 12.9 % (ref 11.5–15.5)
WBC: 5.7 10*3/uL (ref 4.0–10.5)
nRBC: 0 % (ref 0.0–0.2)

## 2020-08-23 LAB — SARS CORONAVIRUS 2 BY RT PCR (HOSPITAL ORDER, PERFORMED IN ~~LOC~~ HOSPITAL LAB): SARS Coronavirus 2: POSITIVE — AB

## 2020-08-23 MED ORDER — SODIUM CHLORIDE 0.9 % IV SOLN
1200.0000 mg | Freq: Once | INTRAVENOUS | Status: AC
  Administered 2020-08-23: 1200 mg via INTRAVENOUS
  Filled 2020-08-23: qty 10

## 2020-08-23 MED ORDER — SODIUM CHLORIDE 0.9 % IV SOLN: INTRAVENOUS | Status: DC | PRN

## 2020-08-23 MED ORDER — SODIUM CHLORIDE 0.9 % IV BOLUS
1000.0000 mL | Freq: Once | INTRAVENOUS | Status: AC
  Administered 2020-08-23: 1000 mL via INTRAVENOUS

## 2020-08-23 MED ORDER — METHYLPREDNISOLONE SODIUM SUCC 125 MG IJ SOLR: 125.0000 mg | Freq: Once | INTRAMUSCULAR | Status: DC | PRN

## 2020-08-23 MED ORDER — EPINEPHRINE 0.3 MG/0.3ML IJ SOAJ: 0.3000 mg | Freq: Once | INTRAMUSCULAR | Status: DC | PRN

## 2020-08-23 MED ORDER — DIPHENHYDRAMINE HCL 50 MG/ML IJ SOLN: 50.0000 mg | Freq: Once | INTRAMUSCULAR | Status: DC | PRN

## 2020-08-23 MED ORDER — ALBUTEROL SULFATE HFA 108 (90 BASE) MCG/ACT IN AERS: 2.0000 | INHALATION_SPRAY | Freq: Once | RESPIRATORY_TRACT | Status: DC | PRN

## 2020-08-23 MED ORDER — FAMOTIDINE IN NACL 20-0.9 MG/50ML-% IV SOLN: 20.0000 mg | Freq: Once | INTRAVENOUS | Status: DC | PRN

## 2020-08-23 NOTE — Discharge Instructions (Signed)
Drink plenty of fluids and get plenty of rest.  Tylenol 1000 mg rotated with ibuprofen 600 mg every 4 hours as needed for pain or fever.  Return to the ER if you develop severe chest pain, difficulty breathing, or other new and concerning symptoms.      Person Under Monitoring Name: Samuel Goodwin  Location: [address] [place] [address]   Infection Prevention Recommendations for Individuals Confirmed to have, or Being Evaluated for, 2019 Novel Coronavirus (COVID-19) Infection Who Receive Care at Home  Individuals who are confirmed to have, or are being evaluated for, COVID-19 should follow the prevention steps below until a healthcare provider or local or state health department says they can return to normal activities.  Stay home except to get medical care You should restrict activities outside your home, except for getting medical care. Do not go to work, school, or public areas, and do not use public transportation or taxis.  Call ahead before visiting your doctor Before your medical appointment, call the healthcare provider and tell them that you have, or are being evaluated for, COVID-19 infection. This will help the healthcare providers office take steps to keep other people from getting infected. Ask your healthcare provider to call the local or state health department.  Monitor your symptoms Seek prompt medical attention if your illness is worsening (e.g., difficulty breathing). Before going to your medical appointment, call the healthcare provider and tell them that you have, or are being evaluated for, COVID-19 infection. Ask your healthcare provider to call the local or state health department.  Wear a facemask You should wear a facemask that covers your nose and mouth when you are in the same room with other people and when you visit a healthcare provider. People who live with or visit you should also wear a facemask while they are in the same room with  you.  Separate yourself from other people in your home As much as possible, you should stay in a different room from other people in your home. Also, you should use a separate bathroom, if available.  Avoid sharing household items You should not share dishes, drinking glasses, cups, eating utensils, towels, bedding, or other items with other people in your home. After using these items, you should wash them thoroughly with soap and water.  Cover your coughs and sneezes Cover your mouth and nose with a tissue when you cough or sneeze, or you can cough or sneeze into your sleeve. Throw used tissues in a lined trash can, and immediately wash your hands with soap and water for at least 20 seconds or use an alcohol-based hand rub.  Wash your [facility] your hands often and thoroughly with soap and water for at least 20 seconds. You can use an alcohol-based hand sanitizer if soap and water are not available and if your hands are not visibly dirty. Avoid touching your eyes, nose, and mouth with unwashed hands.   Prevention Steps for Caregivers and Household Members of Individuals Confirmed to have, or Being Evaluated for, COVID-19 Infection Being Cared for in the Home  If you live with, or provide care at home for, a person confirmed to have, or being evaluated for, COVID-19 infection please follow these guidelines to prevent infection:  Follow healthcare providers instructions Make sure that you understand and can help the patient follow any healthcare provider instructions for all care.  Provide for the patients basic needs You should help the patient with basic needs in the  home and provide support for getting groceries, prescriptions, and other personal needs.  Monitor the patients symptoms If they are getting sicker, call his or her medical provider and tell them that the patient has, or is being evaluated for, COVID-19 infection. This will help the healthcare providers office  take steps to keep other people from getting infected. Ask the healthcare provider to call the local or state health department.  Limit the number of people who have contact with the patient If possible, have only one caregiver for the patient. Other household members should stay in another home or place of residence. If this is not possible, they should stay in another room, or be separated from the patient as much as possible. Use a separate bathroom, if available. Restrict visitors who do not have an essential need to be in the home.  Keep older adults, very young children, and other sick people away from the patient Keep older adults, very young children, and those who have compromised immune systems or chronic health conditions away from the patient. This includes people with chronic heart, lung, or kidney conditions, diabetes, and cancer.  Ensure good ventilation Make sure that shared spaces in the home have good air flow, such as from an air conditioner or an opened window, weather permitting.  Wash your hands often Wash your hands often and thoroughly with soap and water for at least 20 seconds. You can use an alcohol based hand sanitizer if soap and water are not available and if your hands are not visibly dirty. Avoid touching your eyes, nose, and mouth with unwashed hands. Use disposable paper towels to dry your hands. If not available, use dedicated cloth towels and replace them when they become wet.  Wear a facemask and gloves Wear a disposable facemask at all times in the room and gloves when you touch or have contact with the patients blood, body fluids, and/or secretions or excretions, such as sweat, saliva, sputum, nasal mucus, vomit, urine, or feces.  Ensure the mask fits over your nose and mouth tightly, and do not touch it during use. Throw out disposable facemasks and gloves after using them. Do not reuse. Wash your hands immediately after removing your facemask and  gloves. If your personal clothing becomes contaminated, carefully remove clothing and launder. Wash your hands after handling contaminated clothing. Place all used disposable facemasks, gloves, and other waste in a lined container before disposing them with other household waste. Remove gloves and wash your hands immediately after handling these items.  Do not share dishes, glasses, or other household items with the patient Avoid sharing household items. You should not share dishes, drinking glasses, cups, eating utensils, towels, bedding, or other items with a patient who is confirmed to have, or being evaluated for, COVID-19 infection. After the person uses these items, you should wash them thoroughly with soap and water.  Wash laundry thoroughly Immediately remove and wash clothes or bedding that have blood, body fluids, and/or secretions or excretions, such as sweat, saliva, sputum, nasal mucus, vomit, urine, or feces, on them. Wear gloves when handling laundry from the patient. Read and follow directions on labels of laundry or clothing items and detergent. In general, wash and dry with the warmest temperatures recommended on the label.  Clean all areas the individual has used often Clean all touchable surfaces, such as counters, tabletops, doorknobs, bathroom fixtures, toilets, phones, keyboards, tablets, and bedside tables, every day. Also, clean any surfaces that may have blood, body fluids, and/or  secretions or excretions on them. Wear gloves when cleaning surfaces the patient has come in contact with. Use a diluted bleach solution (e.g., dilute bleach with 1 part bleach and 10 parts water) or a household disinfectant with a label that says EPA-registered for coronaviruses. To make a bleach solution at home, add 1 tablespoon of bleach to 1 quart (4 cups) of water. For a larger supply, add  cup of bleach to 1 gallon (16 cups) of water. Read labels of cleaning products and follow  recommendations provided on product labels. Labels contain instructions for safe and effective use of the cleaning product including precautions you should take when applying the product, such as wearing gloves or eye protection and making sure you have good ventilation during use of the product. Remove gloves and wash hands immediately after cleaning.  Monitor yourself for signs and symptoms of illness Caregivers and household members are considered close contacts, should monitor their health, and will be asked to limit movement outside of the home to the extent possible. Follow the monitoring steps for close contacts listed on the symptom monitoring form.   ? If you have additional questions, contact your local health department or call the epidemiologist on call at [phone] (available 24/7). ? This guidance is subject to change. For the most up-to-date guidance from [facility], please refer to their website: [email or web address]

## 2020-08-23 NOTE — ED Notes (Signed)
Pt continues to show no negative reactions to the MAB infusion.

## 2020-08-23 NOTE — ED Notes (Signed)
Pt is not displaying any signs of a reaction to the MAB infusion at this time.

## 2020-08-23 NOTE — ED Triage Notes (Signed)
Patient reports that he tested Covid + yesterday. Patient called his PCP today and wanted him to come to the ED for possible infusion. Patient SOB and generalized body aches, and chills.

## 2020-08-23 NOTE — ED Provider Notes (Addendum)
[facility] DEPT Provider Note   CSN: [id number] Arrival date & time: 08/23/20  1334     History Chief Complaint  Patient presents with  . Covid Positive    Samuel Goodwin is a 59 y.o. male.  Patient is a 59 year old male with past medical history of hepatitis C, hypertension, hyperlipidemia, hypothyroidism, multiple joint replacements, and obesity.  He presents today for evaluation of Covid-like symptoms.  Patient describes cough, headache, and feeling generally unwell for the past several days.  He had a positive home test over the weekend.  His wife is also sick and here with him today.  Patient tells me his primary doctor told him to come here and get the monoclonal antibody infusion.  The history is provided by the patient.       Past Medical History:  Diagnosis Date  . Alcohol abuse    Sobriety since 1990  . Anxiety   . Arthritis    oa  . Benign positional vertigo   . Depression   . Eardrum trauma    30 years ago/right ear  . GERD (gastroesophageal reflux disease)   . Headache    sinus  . Hepatitis C 1994   hep c - ? etiology; in [place] 1989-91; S/P interferon , Dr [name]  . History of bronchitis   . History of iron deficiency anemia   . Hoarseness   . Hyperlipidemia   . Hypertension   . Hypothyroidism   . Obesity   . OSA (obstructive sleep apnea)    on CPAP  . PONV (postoperative nausea and vomiting)    following knee surgery last year   . PTSD (post-traumatic stress disorder)   . PTSD (post-traumatic stress disorder)   . 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 Vocal cord polyps     Patient Active Problem List   Diagnosis Date Noted  . S/P right TKA 12/16/2019  . Status post total knee replacement, right 12/16/2019  . Hip fracture ([place]) 11/13/2018  . Chronic anemia 11/06/2018  . Reactive airway disease 11/06/2018  . Femoral fracture ([place]) 11/05/2018  . Morbid obesity ([place])  12/20/2016  . S/P left THA, AA 12/19/2016  . Chest pain 08/15/2016  . S/P right TH revision 02/09/2016  . Hip instability 02/05/2016  . H/O total hip arthroplasty 02/05/2016  . S/P right THA, AA 02/01/2016  . S/P knee replacement 11/27/2014  . Unspecified viral hepatitis C without hepatic coma 08/28/2014  . DYSPHONIA, CHRONIC 01/06/2010  . NONSPECIFIC ABNORMAL ELECTROCARDIOGRAM 01/06/2010  . HOARSENESS, CHRONIC 05/31/2009  . Hypothyroidism 04/09/2009  . HYPERLIPIDEMIA 04/09/2009  . HTN (hypertension) 04/09/2009  . GERD (gastroesophageal reflux disease) 04/09/2009  . Sleep apnea 04/09/2009    Past Surgical History:  Procedure Laterality Date  . ANTERIOR HIP REVISION Right 02/07/2016   Procedure: ANTERIOR HIP REVISION;  Surgeon: Paralee Cancel, MD;  Location: [facility];  Service: Orthopedics;  Laterality: Right;  . CARDIOVASCULAR STRESS TEST  02/09/2010   No scintigraphic evidence of inducible ischemia.  . COLONOSCOPY  03/2013  . HIP CLOSED REDUCTION Right 02/05/2016   Procedure: CLOSED MANIPULATION HIP;  Surgeon: Latanya Maudlin, MD;  Location: [facility];  Service: Orthopedics;  Laterality: Right;  . knee arthroscopic Bilateral   . LARYNGOSCOPY  08/2009   Dr.[name]  . NASAL FRACTURE SURGERY     age 16  . right knee arthroscopic knee surgery  12 yrs ago   dr Theda Sers  .  THROAT SURGERY  aug, sept, Nov 16 2016   vocal cord  laser sugery X 3, Dr [name] , [facility]  . TOTAL HIP ARTHROPLASTY Right 02/01/2016   Procedure: RIGHT TOTAL HIP ARTHROPLASTY ANTERIOR APPROACH;  Surgeon: Paralee Cancel, MD;  Location: [facility];  Service: Orthopedics;  Laterality: Right;  . TOTAL HIP ARTHROPLASTY Left 12/19/2016   Procedure: LEFT TOTAL HIP ARTHROPLASTY ANTERIOR APPROACH;  Surgeon: Paralee Cancel, MD;  Location: [facility];  Service: Orthopedics;  Laterality: Left;  . TOTAL HIP REVISION Right 11/11/2018   Procedure: right total hip arthroplasty revision, femoral stem;  Surgeon: Paralee Cancel, MD;  Location: [facility];  Service:  Orthopedics;  Laterality: Right;  40min  . TOTAL KNEE ARTHROPLASTY Left 11/27/2014   Procedure: LEFT TOTAL KNEE ARTHROPLASTY;  Surgeon: Augustin Schooling, MD;  Location: [facility];  Service: Orthopedics;  Laterality: Left;  . TOTAL KNEE ARTHROPLASTY Right 12/16/2019   Procedure: TOTAL KNEE ARTHROPLASTY;  Surgeon: Paralee Cancel, MD;  Location: [facility];  Service: Orthopedics;  Laterality: Right;  70 mins  . TRANSTHORACIC ECHOCARDIOGRAM  11/08/2005   EF 68%, normal LV systolic function  . UPPER GASTROINTESTINAL ENDOSCOPY  2010   Negative, Dr.[name]       Family History  Problem Relation Age of Onset  . Cirrhosis Father   . Diabetes Father   . Stroke Father   . Heart failure Mother   . Hypertension Mother   . Thyroid disease Mother   . Heart attack Brother 52  . Aneurysm Maternal Aunt         AAA  . Coronary artery disease Maternal Uncle        MI late 37s    Social History   Tobacco Use  . Smoking status: Never Smoker  . Smokeless tobacco: Never Used  . Tobacco comment: Quit at age 41  Vaping Use  . Vaping Use: Never used  Substance Use Topics  . 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 Drug use: No    Home Medications Prior to Admission medications   Medication Sig Start Date End Date Taking? Authorizing Provider  Ascorbic Acid (VITAMIN C) 1000 MG tablet Take 1,000 mg by mouth daily.     [provider]  b complex vitamins tablet Take 2 tablets by mouth daily.     [provider]  celecoxib (CELEBREX) 200 MG capsule Take 1 capsule (200 mg total) by mouth 2 (two) times daily. 12/16/19   Danae Orleans, PA-C  docusate sodium (COLACE) 100 MG capsule Take 1 capsule (100 mg total) by mouth 2 (two) times daily. 12/16/19   Danae Orleans, PA-C  ferrous sulfate (FERROUSUL) 325 (65 FE) MG tablet Take 1 tablet (325 mg total) by mouth 3 (three) times daily with meals for 14 days. 12/16/19 12/30/19  Danae Orleans, PA-C  fluticasone (FLONASE) 50 MCG/ACT  nasal spray Place 1 spray into both nostrils daily as needed for allergies.  10/31/18   [provider]  hydrochlorothiazide (HYDRODIURIL) 25 MG tablet Take 1 tablet (25 mg total) by mouth daily. 02/01/12   Hendricks Limes, MD  HYDROcodone-acetaminophen (NORCO) 7.5-325 MG tablet Take 1-2 tablets by mouth every 4 (four) hours as needed for moderate pain. 12/16/19   Danae Orleans, PA-C  levothyroxine (SYNTHROID, LEVOTHROID) 125 MCG tablet Take 250 mcg by mouth daily before breakfast.    [provider]  losartan (COZAAR) 50 MG tablet Take 50 mg by mouth daily.    [provider]  methocarbamol (ROBAXIN) 500 MG tablet Take 1 tablet (500 mg total) by mouth every 6 (six) hours as needed for muscle spasms. 12/16/19   Danae Orleans, PA-C  Multiple Vitamin (MULTIVITAMIN) tablet Take 1 tablet by mouth every morning.     [provider]  Pitavastatin Calcium (LIVALO) 2 MG TABS Take 2 mg by mouth at bedtime.    [provider]  polyethylene glycol (MIRALAX / GLYCOLAX) 17 g packet Take 17 g by mouth 2 (two) times daily. 12/16/19   Danae Orleans, PA-C    Allergies    Dilaudid [hydromorphone hcl] and Benazepril hcl  Review of Systems   Review of Systems  All other systems reviewed and are negative.   Physical Exam Updated Vital Signs BP (!) 142/99 (BP Location: Left Arm)   Pulse 79   Temp 98.1 F (36.7 C) (Oral)   Resp 16   Ht 6' (1.829 m)   Wt 129.7 kg   SpO2 97%   BMI 38.79 kg/m   Physical Exam Vitals and nursing note reviewed.  Constitutional:      General: He is not in acute distress.    Appearance: He is well-developed. He is not diaphoretic.  HENT:     Head: Normocephalic and atraumatic.  Cardiovascular:     Rate and Rhythm: Normal rate and regular rhythm.     Heart sounds: No murmur heard.  No friction rub.  Pulmonary:     Effort: Pulmonary effort is normal. No respiratory distress.     Breath sounds: Normal breath sounds. No  wheezing or rales.  Abdominal:     General: Bowel sounds are normal. There is no distension.     Palpations: Abdomen is soft.     Tenderness: There is no abdominal tenderness.  Musculoskeletal:        General: Normal range of motion.     Cervical back: Normal range of motion and neck supple.  Skin:    General: Skin is warm and dry.  Neurological:     Mental Status: He is alert and oriented to person, place, and time.     Coordination: Coordination normal.     ED Results / Procedures / Treatments   Labs (all labs ordered are listed, but only abnormal results are displayed) Labs Reviewed  SARS CORONAVIRUS 2 BY RT PCR (HOSPITAL ORDER, [facility])  BASIC METABOLIC PANEL  CBC WITH DIFFERENTIAL/PLATELET    EKG None  Radiology No results found.  Procedures Procedures (including critical care time)  Medications Ordered in ED Medications  sodium chloride 0.9 % bolus 1,000 mL (has no administration in time range)    ED Course  I have reviewed the triage vital signs and the nursing notes.  Pertinent labs & imaging results that were available during my care of the patient were reviewed by me and considered in my medical decision making (see chart for details).    MDM Rules/Calculators/A&P  Patient presenting with complaints of fever and URI symptoms.  Patient and his wife both here after testing positive for COVID-19.  Patient's vitals are stable with no hypoxia.  He does have comorbidities and meets criteria for monoclonal antibody infusion.  Laboratory studies are unremarkable.  Patient will receive his monoclonal antibodies and then is suitable for discharge.  JASPAL PULTZ was evaluated in Emergency Department on 08/23/2020 for the symptoms described in the history of present illness. He was evaluated in the context of the global COVID-19 pandemic, which  necessitated consideration that the patient might be at risk for infection with the SARS-CoV-2  virus that causes COVID-19. Institutional protocols and algorithms that pertain to the evaluation of patients at risk for COVID-19 are in a state of rapid change based on information released by regulatory bodies including the CDC and federal and state organizations. These policies and algorithms were followed during the patient's care in the ED.  Final Clinical Impression(s) / ED Diagnoses Final diagnoses:  None    Rx / DC Orders ED Discharge Orders    None       Veryl Speak, MD 08/23/20 2251    Veryl Speak, MD 08/23/20 2253

## 2020-10-08 ENCOUNTER — Ambulatory Visit (INDEPENDENT_AMBULATORY_CARE_PROVIDER_SITE_OTHER): Payer: No Typology Code available for payment source

## 2020-10-08 ENCOUNTER — Other Ambulatory Visit: Payer: Self-pay

## 2020-10-08 ENCOUNTER — Encounter: Payer: Self-pay | Admitting: Internal Medicine

## 2020-10-08 ENCOUNTER — Ambulatory Visit (INDEPENDENT_AMBULATORY_CARE_PROVIDER_SITE_OTHER): Payer: Managed Care, Other (non HMO) | Admitting: Internal Medicine

## 2020-10-08 DIAGNOSIS — R058 Other specified cough: Secondary | ICD-10-CM | POA: Diagnosis not present

## 2020-10-08 DIAGNOSIS — J1282 Pneumonia due to coronavirus disease 2019: Secondary | ICD-10-CM

## 2020-10-08 DIAGNOSIS — U071 COVID-19: Secondary | ICD-10-CM | POA: Diagnosis not present

## 2020-10-08 MED ORDER — PANTOPRAZOLE SODIUM 40 MG PO TBEC: DELAYED_RELEASE_TABLET | ORAL | 2 refills | Status: DC

## 2020-10-08 NOTE — Patient Instructions (Addendum)
Whenever cough flares ie  now or in future for any reason:  Omeprazole 20 mg x 2 (or pantoprazole 40 mg)   X 30-60 min before first and last meal until cough gone, breathing better and no need cough meds and inhalers then go back the you were   GERD (REFLUX)  is an extremely common cause of respiratory symptoms just like yours , many times with no obvious heartburn at all.    It can be treated with medication, but also with lifestyle changes including elevation of the head of your bed (ideally with 6 -8inch blocks under the headboard of your bed),  Smoking cessation, avoidance of late meals, excessive alcohol, and avoid fatty foods, chocolate, peppermint, colas, red wine, and acidic juices such as orange juice.  NO MINT OR MENTHOL PRODUCTS SO NO COUGH DROPS  USE SUGARLESS CANDY INSTEAD (Jolley ranchers or Stover's or Life Savers) or even ice chips will also do - the key is to swallow to prevent all throat clearing. NO OIL BASED VITAMINS - use powdered substitutes.  Avoid fish oil when coughing.    Please remember to go to the  x-ray department  for your tests - we will call you with the results when they are available    Only use your albuterol as a rescue medication to be used if you can't catch your breath by resting or doing a relaxed purse lip breathing pattern.  - The less you use it, the better it will work when you need it. - Ok to use up to 2 puffs  every 4 hours if you must but call for immediate appointment if use goes up over your usual need - Don't leave home without it !!  (think of it like the spare tire for your car)   Try albuterol 15 min before an activity that you know would make you short of breath and see if it makes any difference and if makes none then don't take it after activity unless you can't catch your breath.   Make sure you check your oxygen saturations at highest level of activity to be sure it stays over 90% and keep track of it at least once a week, more often if  breathing getting worse, and let me know if losing ground.    Defer to Dr Ardeth Perfect re advise/counseling for the vaccine.    If you are satisfied with your treatment plan,  let your doctor know and he/she can either refill your medications or you can return here when your prescription runs out.     If in any way you are not 100% satisfied,  please tell us.  If 100% better, tell your friends!  Pulmonary follow up is as needed

## 2020-10-08 NOTE — Progress Notes (Signed)
Samuel Goodwin, male    DOB: Sep 04, 1961, 59 y.o.   MRN: [id number]   Brief patient profile:  33 yowm never smoker [place] Vet with throat ca s/p surgery by Bettina Gavia around 2019 with chronic hoarseness but no sob then onset of achy feverish around Sept 10 2021  And Pos test Sept 13 and received monoclonal ab and better over several days in terms of achy/ feverish but fatigue, sob and variably productive cough usually min mucoid and seems to be getting worse so referred to pulmonary clinic 10/08/2020 by Dr   Ardeth Perfect already started on saba      History of Present Illness  10/08/2020  Pulmonary/ 1st office eval/Dierdra Salameh  Chief Complaint  Patient presents with  . Consult    Covid Sept 2021 with antibody tx  Dyspnea: good for 2-3 h p saba improve  But then limited by chronic ortho issues p 2-4 puffs three times a day but using less since started  Cough: better now but worse p eat  Sleep: on cpap/ sleep bed at 30 degrees  SABA use: as above  Omeprazole 20 mg  X 2 at supper   No obvious day to day or daytime variability or assoc excess/ purulent sputum or mucus plugs or hemoptysis or cp or chest tightness, subjective wheeze or overt sinus or hb symptoms.   sleeping without nocturnal  or early am exacerbation  of respiratory  c/o's or need for noct saba. Also denies any obvious fluctuation of symptoms with weather or environmental changes or other aggravating or alleviating factors except as outlined above   No unusual exposure hx or h/o childhood pna/ asthma or knowledge of premature birth.  Current Allergies, Complete Past Medical History, Past Surgical History, Family History, and Social History were reviewed in [employer] record.  ROS  The following are not active complaints unless bolded Hoarseness, sore throat, dysphagia, dental problems, itching, sneezing,  nasal congestion or discharge of excess mucus or purulent secretions, ear ache,   fever, chills, sweats,  unintended wt loss or wt gain, classically pleuritic or exertional cp,  orthopnea pnd or arm/hand swelling  or leg swelling, presyncope, palpitations, abdominal pain, anorexia, nausea, vomiting, diarrhea  or change in bowel habits or change in bladder habits, change in stools or change in urine, dysuria, hematuria,  rash, arthralgias, visual complaints, headache, numbness, weakness or ataxia or problems with walking or coordination,  change in mood or  memory.           Past Medical History:  Diagnosis Date  . Alcohol abuse    Sobriety since 1990  . Anxiety   . Arthritis    oa  . Benign positional vertigo   . Depression   . Eardrum trauma    30 years ago/right ear  . GERD (gastroesophageal reflux disease)   . Headache    sinus  . Hepatitis C 1994   hep c - ? etiology; in [place] 1989-91; S/P interferon , Dr [name]  . History of bronchitis   . History of iron deficiency anemia   . Hoarseness   . Hyperlipidemia   . Hypertension   . Hypothyroidism   . Obesity   . OSA (obstructive sleep apnea)    on CPAP  . PONV (postoperative nausea and vomiting)    following knee surgery last year   . PTSD (post-traumatic stress disorder)   . PTSD (post-traumatic stress disorder)   . 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 Vocal cord polyps     Outpatient Medications Prior to Visit  Medication Sig Dispense Refill  . albuterol (VENTOLIN HFA) 108 (90 Base) MCG/ACT inhaler albuterol sulfate HFA 90 mcg/actuation aerosol inhaler    . Ascorbic Acid (VITAMIN C) 1000 MG tablet Take 1,000 mg by mouth daily.     [name] b complex vitamins tablet Take 2 tablets by mouth daily.     . celecoxib (CELEBREX) 200 MG capsule Take 1 capsule (200 mg total) by mouth 2 (two) times daily. 60 capsule 0  . fluticasone (FLONASE) 50 MCG/ACT nasal spray Place 1 spray into both nostrils daily as needed for allergies.     . hydrochlorothiazide (HYDRODIURIL) 25 MG tablet Take 1 tablet  (25 mg total) by mouth daily. 90 tablet 3  . levothyroxine (SYNTHROID, LEVOTHROID) 125 MCG tablet Take 250 mcg by mouth daily before breakfast.    . losartan (COZAAR) 50 MG tablet Take 50 mg by mouth daily.    . Multiple Vitamin (MULTIVITAMIN) tablet Take 1 tablet by mouth every morning.     . polyethylene glycol (MIRALAX / GLYCOLAX) 17 g packet Take 17 g by mouth 2 (two) times daily. 28 packet 0  . simvastatin (ZOCOR) 10 MG tablet simvastatin 10 mg tablet    . docusate sodium (COLACE) 100 MG capsule Take 1 capsule (100 mg total) by mouth 2 (two) times daily. (Patient not taking: Reported on 10/08/2020) 28 capsule 0  . ferrous sulfate (FERROUSUL) 325 (65 FE) MG tablet Take 1 tablet (325 mg total) by mouth 3 (three) times daily with meals for 14 days. 42 tablet 0  . HYDROcodone-acetaminophen (NORCO) 7.5-325 MG tablet Take 1-2 tablets by mouth every 4 (four) hours as needed for moderate pain. (Patient not taking: Reported on 10/08/2020) 60 tablet 0  . methocarbamol (ROBAXIN) 500 MG tablet Take 1 tablet (500 mg total) by mouth every 6 (six) hours as needed for muscle spasms. (Patient not taking: Reported on 10/08/2020) 40 tablet 0  . Pitavastatin Calcium (LIVALO) 2 MG TABS Take 2 mg by mouth at bedtime. (Patient not taking: Reported on 10/08/2020)           Objective:     BP 114/76 (BP Location: Left Arm, Cuff Size: Large)   Pulse 72   Temp (!) 97.3 F (36.3 C)   Ht 6' (1.829 m)   Wt 296 lb 6.4 oz (134.4 kg)   SpO2 97% Comment: RA  BMI 40.20 kg/m   SpO2: 97 % (RA)   Hoarse amb wm nad/ min psueudowheeze prior to am saba   HEENT : pt wearing mask not removed for exam due to covid -19 concerns.    NECK :  without JVD/Nodes/TM/ nl carotid upstrokes bilaterally   LUNGS: no acc muscle use,  Nl contour chest which is clear to A and P bilaterally without cough on insp or exp maneuvers   CV:  RRR  no s3 or murmur or increase in P2, and no edema   ABD: obese  soft and nontender with nl  inspiratory excursion in the supine position. No bruits or organomegaly appreciated, bowel sounds nl  MS:  Nl gait/ ext warm without deformities, calf tenderness, cyanosis or clubbing No obvious joint restrictions   SKIN: warm and dry without lesions    NEURO:  alert, approp, nl sensorium with  no motor or cerebellar deficits apparent.    CXR PA and Lateral:   10/08/2020 :  I personally reviewed images and  impression as follows:   No evidence of significant sequelae from covid         Assessment   Upper airway cough syndrome S/p throat surgery by [name] at [facility] ? 2020  -  Worse since onset Sept 10 2021 covid onset/ confirmed 08/23/20  While on maint omeprazole 20 x 2 with supper so change to protonix 40 mg bid ac just while flaring   Of the three most common causes of  Sub-acute / recurrent or chronic cough, only one (GERD)  can actually contribute to/ trigger  the other two (asthma and post nasal drip syndrome)  and perpetuate the cylce of cough.  While not intuitively obvious, many patients with chronic low grade reflux do not cough until there is a primary insult that disturbs the protective epithelial barrier and exposes sensitive nerve endings.   This is typically viral but can due to PNDS and  either may apply here.    >>> The point is that once this occurs, it is difficult to eliminate the cycle  using anything but a maximally effective acid suppression regimen at least in the short run, accompanied by an appropriate diet to address non acid GERD and control / eliminate the cough and urge to clear throat with hard rock candy non-mint/menthol and minimize saba if not needed.   If continues to perceive need for saba p 2 weeks max gerd rx will need to consider adding low dose symbicort ie 80 2bid   - The proper method of use, as well as anticipated side effects, of a metered-dose inhaler were discussed and demonstrated to the patient using teach back method. Improved effectiveness  after extensive coaching during this visit to a level of approximately 90 % from a baseline of 75 %   Advised: I spent extra time with pt today reviewing appropriate use of albuterol for prn use on exertion with the following points: 1) saba is for relief of sob that does not improve by walking a slower pace or resting but rather if the pt does not improve after trying this first. 2) If the pt is convinced, as many are, that saba helps recover from activity faster then it's easy to tell if this is the case by re-challenging : ie stop, take the inhaler, then p 5 minutes try the exact same activity (intensity of workload) that just caused the symptoms and see if they are substantially diminished or not after saba 3) if there is an activity that reproducibly causes the symptoms, try the saba 15 min before the activity on alternate days   If in fact the saba really does help, then fine to continue to use it prn but advised may need to look closer at the maintenance regimen (right now nothing but could add symb 80 as above)  being used to achieve better control of airways disease with exertion.            Pneumonia due to COVID-19 virus First symptoms Sept 10  2021  - 10/08/2020  After extensive coaching inhaler device,  effectiveness =   90%  -  10/08/2020   Walked RA 3 laps @ approx 235ft each @ brisk pace  stopped due to end of study,   no sob with sats at end = 93% and cxr ok  Course typical of covid 19 in a normal host with underlying unstable upper airway/ gerd (see UACS)   Rec: Make sure you check your oxygen  saturations at highest level of activity to be sure it stays over 90% and keep track of it at least once a week, more often if breathing getting worse, and let me know if losing ground.   Advised if not back by 6 weeks to where he thinks he should be we will need to seem him back   Re Vaccination: Pt informed of the seriousness of COVID 19 infection as a direct risk to lung health   and safey and to close contacts and should continue to wear a facemask in public and minimize exposure to public locations but especially avoid any area or activity where non-close contacts are not observing distancing or wearing an appropriate face mask.  I strongly recommended she take either of the vaccines available through local drugstores based on updated information on millions of Americans treated with the [place] products  which have proven both safe and  Effective against all the known variants vs natural immunity he may now have that has not been tested (though updates may be available by his 3 month post covid time frame where the decision can be re-eval by his PCP).     Each maintenance medication was reviewed in detail including emphasizing most importantly the difference between maintenance and prns and under what circumstances the prns are to be triggered using an action plan format where appropriate.  Total time for H and P, chart review, counseling, teaching device  directly observing portions of ambulatory 02 saturation study/  and generating customized AVS unique to this office visit / charting =  61 min          Christinia Gully, MD 10/08/2020

## 2020-10-08 NOTE — Assessment & Plan Note (Addendum)
S/p throat surgery by [name] at [facility] ? 2020  -  Worse since onset Sept 10 2021 covid onset/ confirmed 08/23/20  While on maint omeprazole 20 x 2 with supper so change to protonix 40 mg bid ac just while flaring   Of the three most common causes of  Sub-acute / recurrent or chronic cough, only one (GERD)  can actually contribute to/ trigger  the other two (asthma and post nasal drip syndrome)  and perpetuate the cylce of cough.  While not intuitively obvious, many patients with chronic low grade reflux do not cough until there is a primary insult that disturbs the protective epithelial barrier and exposes sensitive nerve endings.   This is typically viral but can due to PNDS and  either may apply here.    >>> The point is that once this occurs, it is difficult to eliminate the cycle  using anything but a maximally effective acid suppression regimen at least in the short run, accompanied by an appropriate diet to address non acid GERD and control / eliminate the cough and urge to clear throat with hard rock candy non-mint/menthol and minimize saba if not needed.   If continues to perceive need for saba p 2 weeks max gerd rx will need to consider adding low dose symbicort ie 80 2bid   - The proper method of use, as well as anticipated side effects, of a metered-dose inhaler were discussed and demonstrated to the patient using teach back method. Improved effectiveness after extensive coaching during this visit to a level of approximately 90 % from a baseline of 75 %   Advised: I spent extra time with pt today reviewing appropriate use of albuterol for prn use on exertion with the following points: 1) saba is for relief of sob that does not improve by walking a slower pace or resting but rather if the pt does not improve after trying this first. 2) If the pt is convinced, as many are, that saba helps recover from activity faster then it's easy to tell if this is the case by re-challenging : ie stop, take  the inhaler, then p 5 minutes try the exact same activity (intensity of workload) that just caused the symptoms and see if they are substantially diminished or not after saba 3) if there is an activity that reproducibly causes the symptoms, try the saba 15 min before the activity on alternate days   If in fact the saba really does help, then fine to continue to use it prn but advised may need to look closer at the maintenance regimen (right now nothing but could add symb 80 as above)  being used to achieve better control of airways disease with exertion.           Each maintenance medication was reviewed in detail including emphasizing most importantly the difference between maintenance and prns and under what circumstances the prns are to be triggered using an action plan format where appropriate.  Total time for H and P, chart review, counseling, teaching device  directly observing portions of ambulatory 02 saturation study/  and generating customized AVS unique to this office visit / charting =  61 min

## 2020-10-08 NOTE — Assessment & Plan Note (Signed)
First symptoms Sept 10  2021  - 10/08/2020  After extensive coaching inhaler device,  effectiveness =   90%  -  10/08/2020   Walked RA 3 laps @ approx 248ft each @ brisk pace  stopped due to end of study,   no sob with sats at end = 93% and cxr ok  Course typical of covid 19 in a normal host with underlying unstable upper airway/ gerd (see UACS)   Rec: Make sure you check your oxygen saturations at highest level of activity to be sure it stays over 90% and keep track of it at least once a week, more often if breathing getting worse, and let me know if losing ground.   Advised if not back by 6 weeks to where he thinks he should be we will need to seem him back   Re Vaccination: Pt informed of the seriousness of COVID 19 infection as a direct risk to lung health  and safey and to close contacts and should continue to wear a facemask in public and minimize exposure to public locations but especially avoid any area or activity where non-close contacts are not observing distancing or wearing an appropriate face mask.  I strongly recommended she take either of the vaccines available through local drugstores based on updated information on millions of Americans treated with the [place] products  which have proven both safe and  Effective against all the known variants vs natural immunity he may now have that has not been tested (though updates may be available by his 3 month post covid time frame where the decision can be re-eval by his PCP).

## 2020-11-16 ENCOUNTER — Ambulatory Visit: Payer: Managed Care, Other (non HMO) | Admitting: Cardiovascular Disease

## 2021-01-02 ENCOUNTER — Other Ambulatory Visit: Payer: Self-pay | Admitting: Internal Medicine

## 2021-01-14 ENCOUNTER — Other Ambulatory Visit: Payer: Self-pay

## 2021-01-14 ENCOUNTER — Encounter: Payer: Self-pay | Admitting: Cardiovascular Disease

## 2021-01-14 ENCOUNTER — Ambulatory Visit (INDEPENDENT_AMBULATORY_CARE_PROVIDER_SITE_OTHER): Payer: No Typology Code available for payment source | Admitting: Cardiovascular Disease

## 2021-01-14 DIAGNOSIS — R0789 Other chest pain: Secondary | ICD-10-CM

## 2021-01-14 DIAGNOSIS — I10 Essential (primary) hypertension: Secondary | ICD-10-CM | POA: Diagnosis not present

## 2021-01-14 DIAGNOSIS — G4733 Obstructive sleep apnea (adult) (pediatric): Secondary | ICD-10-CM

## 2021-01-14 DIAGNOSIS — E785 Hyperlipidemia, unspecified: Secondary | ICD-10-CM | POA: Diagnosis not present

## 2021-01-14 LAB — HEPATIC FUNCTION PANEL
ALT: 25 IU/L (ref 0–44)
AST: 20 IU/L (ref 0–40)
Albumin: 4.4 g/dL (ref 3.8–4.9)
Alkaline Phosphatase: 70 IU/L (ref 44–121)
Bilirubin Total: 0.3 mg/dL (ref 0.0–1.2)
Bilirubin, Direct: 0.12 mg/dL (ref 0.00–0.40)
Total Protein: 6.9 g/dL (ref 6.0–8.5)

## 2021-01-14 LAB — LIPID PANEL
Chol/HDL Ratio: 4.7 ratio (ref 0.0–5.0)
Cholesterol, Total: 206 mg/dL — ABNORMAL HIGH (ref 100–199)
HDL: 44 mg/dL (ref >39)
LDL Chol Calc (NIH): 126 mg/dL — ABNORMAL HIGH (ref 0–99)
Triglycerides: 201 mg/dL — ABNORMAL HIGH (ref 0–149)
VLDL Cholesterol Cal: 36 mg/dL (ref 5–40)

## 2021-01-14 NOTE — Assessment & Plan Note (Signed)
History of essential hypertension a blood pressure measured today 138/90.  He is on hydrochlorothiazide and losartan.

## 2021-01-14 NOTE — Progress Notes (Signed)
01/14/2021 Samuel Goodwin   [birth year]  [id number]  Primary Physician [name], MD Primary Cardiologist: Lorretta Harp MD [name], [place], [place]  HPI:  Samuel Goodwin is a 60 y.o.  moderately overweight, married Caucasian male, father of 3 who I last saw  10/24/2019.  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 He has a strong family history for heart disease along with hypertension and hyperlipidemia. He has obstructive sleep apnea on CPAP which he benefits from.[name] He is trying to lose weight with marginal success.He is undergone bilateral total knee replacements as well as a right total hip replacement. He also has had throat cancer surgically addressed at [facility]. He was complaining of atypical chest pain in the past and had a negative Myoview stress test performed 08/29/2016.  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  He was unable to exercise unfortunately has gained weight as a result of that.  He also needs a right total knee replacement scheduled to be performed after the first the year by Dr. Alvan Dame .  He did recently take a statin holiday at the recommendation of his PCP which resulted in marked improvement in his back and thigh symptoms.  Since I saw him a year ago he continues to do well.  He says he feels the best he has felt in several years especially since stopping his statin therapy.  He does get occasional dyspnea on exertion. Current Meds  Medication Sig  . albuterol (VENTOLIN HFA) 108 (90 Base) MCG/ACT inhaler albuterol sulfate HFA 90 mcg/actuation aerosol inhaler  . Ascorbic Acid (VITAMIN C) 1000 MG tablet Take 1,000 mg by mouth daily.   [name] b complex vitamins tablet Take 2 tablets by mouth daily.   . celecoxib (CELEBREX) 200 MG capsule Take 1 capsule (200 mg total) by mouth 2 (two) times daily.  [name] docusate  sodium (COLACE) 100 MG capsule Take 1 capsule (100 mg total) by mouth 2 (two) times daily.  . fluticasone (FLONASE) 50 MCG/ACT nasal spray Place 1 spray into both nostrils daily as needed for allergies.   . hydrochlorothiazide (HYDRODIURIL) 25 MG tablet Take 1 tablet (25 mg total) by mouth daily.  [name] HYDROcodone-acetaminophen (NORCO) 7.5-325 MG tablet Take 1-2 tablets by mouth every 4 (four) hours as needed for moderate pain.  [name] levothyroxine (SYNTHROID, LEVOTHROID) 125 MCG tablet Take 250 mcg by mouth daily before breakfast.  . losartan (COZAAR) 50 MG tablet Take 50 mg by mouth daily.  . methocarbamol (ROBAXIN) 500 MG tablet Take 1 tablet (500 mg total) by mouth every 6 (six) hours as needed for muscle spasms.  . Multiple Vitamin (MULTIVITAMIN) tablet Take 1 tablet by mouth every morning.  . pantoprazole (PROTONIX) 40 MG tablet TAKE 1 TABLET BY MOUTH 30-60 MINUTES BEFORE FIRST AND LAST MEALS OF DAY  . Pitavastatin Calcium 2 MG TABS Take 2 mg by mouth at bedtime.  . polyethylene glycol (MIRALAX / GLYCOLAX) 17 g packet Take 17 g by mouth 2 (two) times daily.  . simvastatin (ZOCOR) 10 MG tablet simvastatin 10 mg tablet     Allergies  Allergen Reactions  . Dilaudid [Hydromorphone Hcl] Nausea And Vomiting    "Questionable as to whether it was actually dilaudid  That made me so severely nauseous and vomit"  . Benazepril Hcl Cough    Social History  Socioeconomic History  . Marital status: Married    Spouse name: Not on file  . Number of children: Not on file  . Years of education: Not on file  . Highest education level: Not on file  Occupational History  . Not on file  Tobacco Use  . Smoking status: Never Smoker  . Smokeless tobacco: Never Used  . Tobacco comment: Quit at age 24  Vaping Use  . Vaping Use: Never used  Substance and Sexual Activity  . 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 Drug use: No  . Sexual activity: Yes  Other Topics Concern  . Not on  file  Social History Narrative  . Not on file   Social Determinants of Health   Financial Resource Strain: Not on file  Food Insecurity: Not on file  Transportation Needs: Not on file  Physical Activity: Not on file  Stress: Not on file  Social Connections: Not on file  Intimate Partner Violence: Not on file     Review of Systems: General: negative for chills, fever, night sweats or weight changes.  Cardiovascular: negative for chest pain, dyspnea on exertion, edema, orthopnea, palpitations, paroxysmal nocturnal dyspnea or shortness of breath Dermatological: negative for rash Respiratory: negative for cough or wheezing Urologic: negative for hematuria Abdominal: negative for nausea, vomiting, diarrhea, bright red blood per rectum, melena, or hematemesis Neurologic: negative for visual changes, syncope, or dizziness All other systems reviewed and are otherwise negative except as noted above.    Blood pressure 138/90, pulse 71, height 6' (1.829 m), weight 299 lb (135.6 kg).  General appearance: alert and no distress Neck: no adenopathy, no carotid bruit, no JVD, supple, symmetrical, trachea midline and thyroid not enlarged, symmetric, no tenderness/mass/nodules Lungs: clear to auscultation bilaterally Heart: regular rate and rhythm, S1, S2 normal, no murmur, click, rub or gallop Extremities: extremities normal, atraumatic, no cyanosis or edema Pulses: 2+ and symmetric Skin: Skin color, texture, turgor normal. No rashes or lesions Neurologic: Alert and oriented X 3, normal strength and tone. Normal symmetric reflexes. Normal coordination and gait  EKG sinus rhythm at 71 without ST or T wave changes.  I personally reviewed this EKG.  ASSESSMENT AND PLAN:   HYPERLIPIDEMIA History of hyperlipidemia recently found to be statin intolerant.  He was having hip back and thigh issues which prevented him from ambulating and when he stopped his simvastatin his symptoms resolved.  We will  check a lipid liver panel today.  He may require an alternative pharmacologic therapy.  HTN (hypertension) History of essential hypertension a blood pressure measured today 138/90.  He is on hydrochlorothiazide and losartan.  Sleep apnea History of obstructive sleep apnea on CPAP which she will wears religiously.  Morbid obesity ([place]) History of morbid obesity with a BMI of 40.  He is committed to weight loss.  We will refer him to Dr. Leafy Ro at the [facility] for further evaluation and treatment.      Lorretta Harp MD FACP,FACC,FAHA, [facility] 01/14/2021 8:25 AM

## 2021-01-14 NOTE — Patient Instructions (Signed)
Medication Instructions:  Your physician recommends that you continue on your current medications as directed. Please refer to the Current Medication list given to you today.  *If you need a refill on your cardiac medications before your next appointment, please call your pharmacy*   Lab Work: Your physician recommends that you have labs drawn today: Lipid/liver profile  If you have labs (blood work) drawn today and your tests are completely normal, you will receive your results only by: [name] MyChart Message (if you have MyChart) OR . A paper copy in the mail If you have any lab test that is abnormal or we need to change your treatment, we will call you to review the results.   Follow-Up: At [facility], you and your health needs are our priority.  As part of our continuing mission to provide you with exceptional heart care, we have created designated Provider Care Teams.  These Care Teams include your primary Cardiologist (physician) and Advanced Practice Providers (APPs -  Physician Assistants and Nurse Practitioners) who all work together to provide you with the care you need, when you need it.  We recommend signing up for the patient portal called "MyChart".  Sign up information is provided on this After Visit Summary.  MyChart is used to connect with patients for Virtual Visits (Telemedicine).  Patients are able to view lab/test results, encounter notes, upcoming appointments, etc.  Non-urgent messages can be sent to your provider as well.   To learn more about what you can do with MyChart, go to [email or web address].    Your next appointment:   12 month(s)  The format for your next appointment:   In Person  Provider:   Quay Burow, MD   Other Instructions Referral sent to Diet and weight loss management center with Dr. Dennard Nip.  [address], [place], [place] [address] Phone: [phone]

## 2021-01-14 NOTE — Assessment & Plan Note (Signed)
History of hyperlipidemia recently found to be statin intolerant.  He was having hip back and thigh issues which prevented him from ambulating and when he stopped his simvastatin his symptoms resolved.  We will check a lipid liver panel today.  He may require an alternative pharmacologic therapy.

## 2021-01-14 NOTE — Assessment & Plan Note (Signed)
History of obstructive sleep apnea on CPAP which she will wears religiously.

## 2021-01-14 NOTE — Assessment & Plan Note (Addendum)
History of morbid obesity with a BMI of 40.  He is committed to weight loss.  We will refer him to Dr. Leafy Ro at the [place] [facility] for further evaluation and treatment.

## 2021-01-17 ENCOUNTER — Telehealth: Payer: Self-pay | Admitting: Cardiovascular Disease

## 2021-01-17 NOTE — Telephone Encounter (Signed)
Pt updated with lab results along with MD's recommendations. Pt verbalized understanding and message sent to scheduling.   Lorretta Harp, MD  01/16/2021 11:29 AM EST Back to Top     Not at goal for primary prevention. Refer to Pharm D for alternative non statin Rx

## 2021-01-17 NOTE — Telephone Encounter (Signed)
Patient would like for someone to call him to discuss his lab results

## 2021-01-19 ENCOUNTER — Other Ambulatory Visit: Payer: Self-pay

## 2021-01-19 ENCOUNTER — Telehealth: Payer: Self-pay | Admitting: Cardiovascular Disease

## 2021-01-19 DIAGNOSIS — E785 Hyperlipidemia, unspecified: Secondary | ICD-10-CM

## 2021-01-19 NOTE — Telephone Encounter (Signed)
Ronny Bacon from [place] called to request the latest OV note from the patient's appointment with Dr. Gwenlyn Found 01/14/21. Please fax note to [phone] Attn: Ronny Bacon
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
## 2021-01-25 ENCOUNTER — Telehealth: Payer: Self-pay | Admitting: Cardiovascular Disease

## 2021-01-25 NOTE — Telephone Encounter (Signed)
Routed to primary nurse to review

## 2021-01-25 NOTE — Telephone Encounter (Signed)
    Pt is requesting to send his referral for diet weight management program to [facility] community care program at fax# [phone]

## 2021-01-26 NOTE — Telephone Encounter (Signed)
Referral printed and faxed to [facility] community care program. Spoke with pt and confirmed that fax was sent.

## 2021-01-27 ENCOUNTER — Ambulatory Visit: Payer: Managed Care, Other (non HMO)

## 2021-01-27 ENCOUNTER — Telehealth: Payer: Self-pay

## 2021-01-27 NOTE — Telephone Encounter (Signed)
FYI--Patient is returning call to confirm [place] fax number. He states the number he has is the same number we have in past encounters, [phone]. I made him aware, per previous message, RN attempted to send the referral twice yesterday with no success. He plans to call the [facility] to make them aware and to confirm their fax number again.

## 2021-01-27 NOTE — Telephone Encounter (Signed)
Referral sent to email provided and refaxed as well.

## 2021-01-27 NOTE — Telephone Encounter (Signed)
Attempted to call pt. Message left on VM. Attempted to send referral to [facility] community care program via fax twice yesterday, both times failed to sent.

## 2021-01-27 NOTE — Progress Notes (Incomplete)
01/27/2021 Donold Marotto 1961-01-20 [id number]   HPI:  Samuel Goodwin is a 60 y.o. male patient of Dr Gwenlyn Found, who presents today for a lipid clinic evaluation.  See pertinent past medical history below.  Past Medical History: hypertension   OSA On CPAP               Current Medications:  Cholesterol Goals:   Intolerant/previously tried: simvastatin - myalgias  Family history:   Diet:   Exercise:    Labs:  2/22:  TC 206, TG 201, HDL 44, LDL 126   Current Outpatient Medications  Medication Sig Dispense Refill  . albuterol (VENTOLIN HFA) 108 (90 Base) MCG/ACT inhaler albuterol sulfate HFA 90 mcg/actuation aerosol inhaler    . Ascorbic Acid (VITAMIN C) 1000 MG tablet Take 1,000 mg by mouth daily.     [name] b complex vitamins tablet Take 2 tablets by mouth daily.     . celecoxib (CELEBREX) 200 MG capsule Take 1 capsule (200 mg total) by mouth 2 (two) times daily. 60 capsule 0  . docusate sodium (COLACE) 100 MG capsule Take 1 capsule (100 mg total) by mouth 2 (two) times daily. 28 capsule 0  . ferrous sulfate (FERROUSUL) 325 (65 FE) MG tablet Take 1 tablet (325 mg total) by mouth 3 (three) times daily with meals for 14 days. 42 tablet 0  . fluticasone (FLONASE) 50 MCG/ACT nasal spray Place 1 spray into both nostrils daily as needed for allergies.     . hydrochlorothiazide (HYDRODIURIL) 25 MG tablet Take 1 tablet (25 mg total) by mouth daily. 90 tablet 3  . HYDROcodone-acetaminophen (NORCO) 7.5-325 MG tablet Take 1-2 tablets by mouth every 4 (four) hours as needed for moderate pain. 60 tablet 0  . levothyroxine (SYNTHROID, LEVOTHROID) 125 MCG tablet Take 250 mcg by mouth daily before breakfast.    . losartan (COZAAR) 50 MG tablet Take 50 mg by mouth daily.    . methocarbamol (ROBAXIN) 500 MG tablet Take 1 tablet (500 mg total) by mouth every 6 (six) hours as needed for muscle spasms. 40 tablet 0  . Multiple Vitamin (MULTIVITAMIN) tablet Take 1 tablet by mouth every  morning.    . pantoprazole (PROTONIX) 40 MG tablet TAKE 1 TABLET BY MOUTH 30-60 MINUTES BEFORE FIRST AND LAST MEALS OF DAY 180 tablet 0  . Pitavastatin Calcium 2 MG TABS Take 2 mg by mouth at bedtime.    . polyethylene glycol (MIRALAX / GLYCOLAX) 17 g packet Take 17 g by mouth 2 (two) times daily. 28 packet 0  . simvastatin (ZOCOR) 10 MG tablet simvastatin 10 mg tablet     No current facility-administered medications for this visit.    Allergies  Allergen Reactions  . Dilaudid [Hydromorphone Hcl] Nausea And Vomiting    "Questionable as to whether it was actually dilaudid  That made me so severely nauseous and vomit"  . Benazepril Hcl Cough    Past Medical History:  Diagnosis Date  . Alcohol abuse    Sobriety since 1990  . Anxiety   . Arthritis    oa  . Benign positional vertigo   . Depression   . Eardrum trauma    30 years ago/right ear  . GERD (gastroesophageal reflux disease)   . Headache    sinus  . Hepatitis C 1994   hep c - ? etiology; in [place] 1989-91; S/P interferon , Dr [name]  . History of bronchitis   . History of iron deficiency anemia   .  Hoarseness   . Hyperlipidemia   . Hypertension   . Hypothyroidism   . Obesity   . OSA (obstructive sleep apnea)    on CPAP  . PONV (postoperative nausea and vomiting)    following knee surgery last year   . PTSD (post-traumatic stress disorder)   . PTSD (post-traumatic stress disorder)   . 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 Vocal cord polyps     There were no vitals taken for this visit.   No problem-specific Assessment & Plan notes found for this encounter.   Tommy Medal PharmD CPP [facility] [address] [place], [place] [address] [phone]

## 2021-01-27 NOTE — Telephone Encounter (Signed)
Patient is following up.  He states he called the [place] and confirmed the fax number is correct.  Per patient, [facility] still [facility] not [facility], but they provided an e-mail to send it to if possible:  [email or web address]

## 2021-01-31 ENCOUNTER — Ambulatory Visit (INDEPENDENT_AMBULATORY_CARE_PROVIDER_SITE_OTHER): Payer: No Typology Code available for payment source | Admitting: Pharmacist

## 2021-01-31 ENCOUNTER — Other Ambulatory Visit: Payer: Self-pay

## 2021-01-31 VITALS — BP 132/80 | HR 99 | Resp 14 | Ht 72.0 in | Wt 299.0 lb

## 2021-01-31 DIAGNOSIS — E785 Hyperlipidemia, unspecified: Secondary | ICD-10-CM | POA: Diagnosis not present

## 2021-01-31 MED ORDER — EZETIMIBE 10 MG PO TABS: 10.0000 mg | ORAL_TABLET | Freq: Every day | ORAL | 1 refills | Status: DC

## 2021-01-31 NOTE — Assessment & Plan Note (Signed)
Patient with history of hyperlipidemia with history of statin intolerance to simvastatin, most recent LDL not at goal for primary prevention. Patient tolerated pitavastatin in past but switched to simvastatin due to insurance coverage. Will start ezetimibe 10 mg daily today, can consider restarting pitavastatin or low dose rosuvastatin if LDL not at goal. Repeat lipid and hepatic function panel in 8 weeks. Encouraged patient to continue exercise and visit weight loss clinic via Dr. Kennon Holter referral.

## 2021-01-31 NOTE — Patient Instructions (Addendum)
Your Results:             Your most recent labs Goal  Total Cholesterol 206 < 200  Triglycerides 201 < 150  HDL (good cholesterol) 44 > 40  LDL (bad cholesterol) 126 < 100   Medication changes: *START taking ezetimibe 10mg  daily*  Lab orders: *Fasting blood work in 2 months   Thank you for choosing [facility]

## 2021-01-31 NOTE — Progress Notes (Deleted)
Patient ID: Samuel Goodwin                 DOB: January 23, 1961                    MRN: [id number]     HPI: Samuel Goodwin is a 60 y.o. male patient referred to lipid clinic by Dr. Gwenlyn Found. PMH is significant for   Current Medications:   Intolerances:  Risk Factors:  LDL goal:   Diet:   Exercise:   Family History:   Social History:   Labs:  Past Medical History:  Diagnosis Date  . Alcohol abuse    Sobriety since 1990  . Anxiety   . Arthritis    oa  . Benign positional vertigo   . Depression   . Eardrum trauma    30 years ago/right ear  . GERD (gastroesophageal reflux disease)   . Headache    sinus  . Hepatitis C 1994   hep c - ? etiology; in [place] 1989-91; S/P interferon , Dr [name]  . History of bronchitis   . History of iron deficiency anemia   . Hoarseness   . Hyperlipidemia   . Hypertension   . Hypothyroidism   . Obesity   . OSA (obstructive sleep apnea)    on CPAP  . PONV (postoperative nausea and vomiting)    following knee surgery last year   . PTSD (post-traumatic stress disorder)   . PTSD (post-traumatic stress disorder)   . 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 Vocal cord polyps     Current Outpatient Medications on File Prior to Visit  Medication Sig Dispense Refill  . albuterol (VENTOLIN HFA) 108 (90 Base) MCG/ACT inhaler albuterol sulfate HFA 90 mcg/actuation aerosol inhaler    . Ascorbic Acid (VITAMIN C) 1000 MG tablet Take 1,000 mg by mouth daily.     [name] b complex vitamins tablet Take 2 tablets by mouth daily.     . celecoxib (CELEBREX) 200 MG capsule Take 1 capsule (200 mg total) by mouth 2 (two) times daily. 60 capsule 0  . docusate sodium (COLACE) 100 MG capsule Take 1 capsule (100 mg total) by mouth 2 (two) times daily. 28 capsule 0  . ferrous sulfate (FERROUSUL) 325 (65 FE) MG tablet Take 1 tablet (325 mg total) by mouth 3 (three) times daily with meals for 14 days. 42 tablet 0  .  fluticasone (FLONASE) 50 MCG/ACT nasal spray Place 1 spray into both nostrils daily as needed for allergies.     . hydrochlorothiazide (HYDRODIURIL) 25 MG tablet Take 1 tablet (25 mg total) by mouth daily. 90 tablet 3  . HYDROcodone-acetaminophen (NORCO) 7.5-325 MG tablet Take 1-2 tablets by mouth every 4 (four) hours as needed for moderate pain. 60 tablet 0  . levothyroxine (SYNTHROID, LEVOTHROID) 125 MCG tablet Take 250 mcg by mouth daily before breakfast.    . losartan (COZAAR) 50 MG tablet Take 50 mg by mouth daily.    . methocarbamol (ROBAXIN) 500 MG tablet Take 1 tablet (500 mg total) by mouth every 6 (six) hours as needed for muscle spasms. 40 tablet 0  . Multiple Vitamin (MULTIVITAMIN) tablet Take 1 tablet by mouth every morning.    . pantoprazole (PROTONIX) 40 MG tablet TAKE 1 TABLET BY MOUTH 30-60 MINUTES BEFORE FIRST AND LAST MEALS OF DAY 180 tablet 0  . Pitavastatin Calcium 2 MG TABS Take 2 mg by mouth at  bedtime.    . polyethylene glycol (MIRALAX / GLYCOLAX) 17 g packet Take 17 g by mouth 2 (two) times daily. 28 packet 0  . simvastatin (ZOCOR) 10 MG tablet simvastatin 10 mg tablet     No current facility-administered medications on file prior to visit.    Allergies  Allergen Reactions  . Dilaudid [Hydromorphone Hcl] Nausea And Vomiting    "Questionable as to whether it was actually dilaudid  That made me so severely nauseous and vomit"  . Benazepril Hcl Cough    No problem-specific Assessment & Plan notes found for this encounter.    Samuel Goodwin PharmD, BCPS, [place] [id number] 01/31/2021 8:35 AM

## 2021-01-31 NOTE — Progress Notes (Signed)
Patient ID: Lyfe Monger                 DOB: Jan 09, 1961                    MRN: [id number]     HPI: Samuel Goodwin is a 60 y.o. male patient referred to lipid clinic by Dr. Gwenlyn Found. PMH is significant for HTN, HLD, OSA managed by CPAP, history of atypical chest pain, bilateral knee replacements. Patient last seen by Dr. Gwenlyn Found 2 weeks ago, reported taking a statin holiday that showed marked improvement in back and thigh symptoms.  Today patient denies chest pain currently. Reports improved ability to exercise since stopping simvastatin.   Current Medications: no current antihyperlipidemic medications  Intolerances:  Simvastatin - severe joint stiffness, significantly improved 1-2 days after stopped Pitavastatin - on for "years", stopped 1-2 years ago due to lack of [facility] formulary coverage  Risk Factors: HTN, HLD, OSA  ASCVD 10 year risk: 11.9%  LDL goal: <100  Exercise: swims 10 laps 2-3x/week, 2 miles walking 2-3x/week. limited by joint pain and history of bilateral knee replacements   Family History: strong family history of heart disease  Social History: never smoker  Labs: 01/15/20 TC 206, TG 201, HDL 44, LDL 126; AST/ALT 20/25      Past Medical History:  Diagnosis Date   Alcohol abuse    Sobriety since 1990   Anxiety    Arthritis    oa   Benign positional vertigo    Depression    Eardrum trauma    30 years ago/right ear   GERD (gastroesophageal reflux disease)    Headache    sinus   Hepatitis C 1994   hep c - ? 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      b complex vitamins tablet Take 2 tablets by mouth daily.      celecoxib (CELEBREX) 200 MG capsule Take 1 capsule (200 mg total) by mouth 2 (two) times daily. 60 capsule 0   docusate sodium (COLACE) 100 MG capsule Take 1 capsule (100 mg total) by mouth 2 (two) times daily. 28 capsule 0   ferrous sulfate (FERROUSUL) 325 (65 FE) MG tablet Take 1 tablet (325 mg total) by mouth 3 (three) times daily with meals for 14 days. 42 tablet 0   fluticasone (FLONASE) 50 MCG/ACT nasal spray Place 1 spray into both nostrils daily as needed for allergies.      hydrochlorothiazide (HYDRODIURIL) 25 MG tablet Take 1 tablet (25 mg total) by mouth daily. 90 tablet 3  HYDROcodone-acetaminophen (NORCO) 7.5-325 MG tablet Take 1-2 tablets by mouth every 4 (four) hours as needed for moderate pain. 60 tablet 0   levothyroxine (SYNTHROID, LEVOTHROID) 125 MCG tablet Take 250 mcg by mouth daily before breakfast.     losartan (COZAAR) 50 MG tablet Take 50 mg by mouth daily.     methocarbamol (ROBAXIN) 500 MG tablet Take 1 tablet (500 mg total) by mouth every 6 (six) hours as needed for muscle spasms. 40 tablet 0   Multiple Vitamin (MULTIVITAMIN) tablet Take 1 tablet by mouth every morning.     pantoprazole (PROTONIX) 40 MG tablet TAKE 1 TABLET BY MOUTH 30-60 MINUTES BEFORE FIRST AND LAST MEALS OF DAY 180 tablet 0   Pitavastatin Calcium 2 MG TABS Take 2 mg by mouth at bedtime.     polyethylene glycol (MIRALAX / GLYCOLAX) 17 g packet Take 17 g by mouth 2 (two) times daily. 28 packet 0    simvastatin (ZOCOR) 10 MG tablet simvastatin 10 mg tablet     No current facility-administered medications on file prior to visit.         Allergies  Allergen Reactions   Dilaudid [Hydromorphone Hcl] Nausea And Vomiting    "Questionable as to whether it was actually dilaudid  That made me so severely nauseous and vomit"   Benazepril Hcl Cough    Hyperlipidemia: Patient with history of hyperlipidemia with history of statin intolerance to simvastatin, most recent LDL not at goal for primary prevention. Patient tolerated pitavastatin in past but switched to simvastatin due to insurance coverage. Will start ezetimibe 10 mg daily today, can consider restarting pitavastatin or low dose rosuvastatin if LDL not at goal. Repeat lipid and hepatic function panel in 8 weeks. Encouraged patient to continue exercise and visit weight loss clinic via Dr. Kennon Holter referral.    Fara Olden, PharmD PGY-1 Ambulatory Care Pharmacy Resident 01/31/2021 12:11 PM  Discussed and reviewed Samuel Goodwin PharmD, BCPS, Samuel Goodwin [id number] 02/02/2021 10:56 AM

## 2021-02-02 ENCOUNTER — Encounter: Payer: Self-pay | Admitting: Pharmacist

## 2021-03-31 ENCOUNTER — Telehealth: Payer: Self-pay

## 2021-03-31 DIAGNOSIS — E785 Hyperlipidemia, unspecified: Secondary | ICD-10-CM

## 2021-03-31 NOTE — Telephone Encounter (Signed)
-----   Message from Aleneva Rodriguez-Guzman, [place] sent at 03/31/2021  9:25 AM EDT ----- Regarding: Repeat lipid panel Patient due to repeat fasting lipid panel and determine if cholesterol better controlled since adding ezetimibe to his simvastatin therapy.  Order in [place] already.  Please call patient as reminder.  Thanks ----- Message ----- From: Harrington Challenger, RPH-CPP Sent: 03/31/2021  12:00 AM EDT To: Raquel Rodriguez-Guzman, RPH-CPP  Lipid panel, okay to start low dose rosuvastatin?

## 2021-03-31 NOTE — Telephone Encounter (Signed)
Lmom; patient to call back to discuss questions and alternative to therapy.

## 2021-03-31 NOTE — Telephone Encounter (Signed)
Returned a call to pt to schedule lab work fasting lipid but they are requesting to speak w/pharmd regarding having issues with zetia. Will route to pharmd pool. Labs were ordered. Will route to raquel pharmd to assist

## 2021-04-02 ENCOUNTER — Other Ambulatory Visit: Payer: Self-pay | Admitting: Internal Medicine

## 2021-04-04 NOTE — Telephone Encounter (Signed)
Pt to follow up with pulmonary as needed. Dr. Melvyn Novas, please advise if you are okay with [place] refilling pt's med. Looks like he is requesting a 3 month supply.

## 2021-04-05 NOTE — Telephone Encounter (Signed)
lmtcb for pt.  

## 2021-04-05 NOTE — Telephone Encounter (Signed)
If doing great needs to start weaning this  - can do this thru PCP or make televisit as add on this week.

## 2021-04-07 NOTE — Telephone Encounter (Signed)
Refill x once but notify pharmacy no more s ov

## 2021-05-16 ENCOUNTER — Other Ambulatory Visit: Payer: Self-pay | Admitting: Internal Medicine

## 2021-05-26 ENCOUNTER — Ambulatory Visit (HOSPITAL_BASED_OUTPATIENT_CLINIC_OR_DEPARTMENT_OTHER): Payer: No Typology Code available for payment source | Admitting: Physical Therapy

## 2021-06-02 ENCOUNTER — Other Ambulatory Visit: Payer: Self-pay

## 2021-06-02 ENCOUNTER — Encounter (HOSPITAL_BASED_OUTPATIENT_CLINIC_OR_DEPARTMENT_OTHER): Payer: Self-pay | Admitting: Physical Therapy

## 2021-06-02 ENCOUNTER — Ambulatory Visit (HOSPITAL_BASED_OUTPATIENT_CLINIC_OR_DEPARTMENT_OTHER): Payer: No Typology Code available for payment source | Admitting: Physical Therapy

## 2021-06-02 ENCOUNTER — Ambulatory Visit (HOSPITAL_BASED_OUTPATIENT_CLINIC_OR_DEPARTMENT_OTHER): Payer: No Typology Code available for payment source | Attending: Family Medicine | Admitting: Physical Therapy

## 2021-06-02 DIAGNOSIS — M25551 Pain in right hip: Secondary | ICD-10-CM | POA: Insufficient documentation

## 2021-06-02 DIAGNOSIS — M6281 Muscle weakness (generalized): Secondary | ICD-10-CM | POA: Diagnosis present

## 2021-06-02 DIAGNOSIS — R262 Difficulty in walking, not elsewhere classified: Secondary | ICD-10-CM | POA: Insufficient documentation

## 2021-06-02 DIAGNOSIS — M79671 Pain in right foot: Secondary | ICD-10-CM | POA: Insufficient documentation

## 2021-06-02 NOTE — Therapy (Signed)
[place], [place], [address] Phone: [phone]   Fax:  [phone]  Physical Therapy Evaluation  Patient Details  Name: Samuel Goodwin MRN: [id number] Date of Birth: May 23, 1961 Referring Provider (PT): Tawnya Crook, MD   Encounter Date: 06/02/2021   PT End of Session - 06/02/21 2001     Visit Number 1    Number of Visits 15    Date for PT Re-Evaluation 07/29/21    Authorization Type [facility]    Authorization - Number of Visits 15    PT Start Time 1300    PT Stop Time 3009    PT Time Calculation (min) 44 min    Activity Tolerance Patient tolerated treatment well    Behavior During Therapy WFL for tasks assessed/performed             Past Medical History:  Diagnosis Date   Alcohol abuse    Sobriety since 1990   Anxiety    Arthritis    oa   Benign positional vertigo    Depression    Eardrum trauma    30 years ago/right ear   GERD (gastroesophageal reflux disease)    Headache    sinus   Hepatitis C 1994   hep c - ? 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   COLONOSCOPY  03/2013   HIP CLOSED REDUCTION Right  02/05/2016   Procedure: CLOSED MANIPULATION HIP;  Surgeon: Latanya Maudlin, MD;  Location: [facility];  Service: Orthopedics;  Laterality: Right;   knee arthroscopic Bilateral    LARYNGOSCOPY  08/2009   Dr.[name]   NASAL FRACTURE SURGERY     age 28   right knee arthroscopic knee surgery  12 yrs ago   dr Theda Sers   THROAT SURGERY  aug, sept, Nov 16 2016   vocal cord  laser sugery X 3, Dr [name] , [facility]   TOTAL HIP ARTHROPLASTY Right 02/01/2016   Procedure: RIGHT TOTAL HIP ARTHROPLASTY ANTERIOR APPROACH;  Surgeon: Paralee Cancel, MD;  [facility];  Service: Orthopedics;  Laterality: Right;   TOTAL HIP ARTHROPLASTY Left 12/19/2016   Procedure: LEFT TOTAL HIP ARTHROPLASTY ANTERIOR APPROACH;  Surgeon: Paralee Cancel, MD;  [facility];  Service: Orthopedics;  Laterality: Left;   TOTAL HIP REVISION Right 11/11/2018   Procedure: right total hip arthroplasty revision, femoral stem;  Surgeon: Paralee Cancel, MD;  [facility];  Service: Orthopedics;  Laterality: Right;  1min   TOTAL KNEE ARTHROPLASTY Left 11/27/2014   Procedure: LEFT TOTAL KNEE ARTHROPLASTY;  Surgeon: Augustin Schooling, MD;  Location: [facility];  Service: Orthopedics;  Laterality: Left;   TOTAL KNEE ARTHROPLASTY Right 12/16/2019   Procedure: TOTAL KNEE ARTHROPLASTY;  Surgeon: Paralee Cancel, MD;  [facility];  Service: Orthopedics;  Laterality: Right;  70 mins   TRANSTHORACIC ECHOCARDIOGRAM  11/08/2005   EF 68%, normal LV systolic function   UPPER GASTROINTESTINAL ENDOSCOPY  2010   Negative, Dr.[name]    There were no vitals filed for this visit.    Subjective Assessment - 06/02/21 1310     Subjective Back from [place] 30ish years ago. I always hurt. I twisted Lt knee and had it replaced. I was in excruciating pain on Lt hip and had 13 procedures in 34 mo and had hip replaced. 3 mo later said Rt hip needs to be replaced. In this time found throat CA and had 3 surgeries. Lt hip was repaced, was walking around that night and it dislocated  when he rolled over that night. Was in traction for a few days and had it fixed and started in PT. Was doing a walking program and was walking in [facility] and the neck of the Rt femur broke. Replacement was revised. Rt knee was replaced last. Pain lateral Lt knee and medial Rt knee. Rt lower leg is so tight so my Rt leg is halfway bent when I walk. Still cannot stand up straight on Rt hip. My Rigth knee is not all the way straight.    Patient Stated Goals improve strength, improve how I walk    Currently in Pain? No/denies                [facility] PT Assessment - 06/02/21 0001       Assessment   Medical Diagnosis pain in hip, knee, foo    Referring Provider (PT) Tawnya Crook, MD    Onset Date/Surgical Date --   a few years   Prior Therapy currently in PT at [facility]      Precautions   Precautions Posterior Hip      Restrictions   Weight Bearing Restrictions No      Balance Screen   Has the patient fallen in the past 6 months No    Has the patient had a decrease in activity level because of a fear of falling?  Yes    Is the patient reluctant to leave their home because of a fear of falling?  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      Cognition   Overall Cognitive Status Within Functional Limits for tasks assessed      Sensation   Additional Comments N/T in Rt foot- esp when sitting in car      Posture/Postural Control   Posture Comments Rt ASIS elevation in standing-, Lt 98cm, 99.5      ROM / Strength   AROM / PROM / Strength AROM;Strength      AROM   Overall AROM Comments lacking 3-5 deg of knee ext on Rt upon visual inspection      Strength   Overall Strength Comments Rt hip flexion 3/5, able to stand without use of UEswhen asked but tends to use UEs for assist      Palpation   Palpation comment Lt post  innominate rotation      Ambulation/Gait   Gait Comments lacking trunk rotation and arm  swing                        Objective measurements completed on examination: See above findings.               PT Education - 06/02/21 2000     Education Details anatomy of condition, POC, LLD/inom rotation and heel lift    Person(s) Educated Patient    Methods Explanation    Comprehension Verbalized understanding;Need further instruction                 PT Long Term Goals - 06/02/21 1323       PT LONG TERM GOAL #1   Title I would like to be able to climb stairs using my Right leg    Baseline unable at eval    Time 8    Period Weeks    Status New    Target Date 07/29/21      PT LONG TERM GOAL #2   Title Walk normal    Baseline I feel like I wobble like a penguin    Time 8    Period Weeks    Status New    Target Date 07/29/21      PT LONG TERM GOAL #3   Title build up leg strength and strengthen the core    Baseline HEP to be est    Time 8    Period Weeks    Status New    Target Date 07/29/21      PT LONG TERM GOAL #4   Title reduce Rt foot numbness    Baseline gets numb after 5 min in the car    Time 8    Period Weeks    Status New    Target Date 07/29/21                    Plan - 06/02/21 1342     Clinical Impression Statement Pt presents to PT with a complex history and multiple joint replacements and revisions. He is currently doing land therapy at [facility] and has come here to supplement his PT via aquatic therapy. I will discuss POC and treatment with his current PT. Pt is very active and likes to walk but feels like he walks like a penguin and does not have the strength to walk as much as he would like to. Water will be a great challenge to hip as he can use it as a resistance in all motions as well as challenge endurance and balance safely. He does enjoy doing aquatic exercises but reports exercises mostly for the UEs.  pt will benefit from aquatic therapy to address strength and endurance in a safe manner. 3-layer heel lift was placed in his left shoe today. He did feel like this caused him to lean to te Right moreso but he tends to lean left anyways and felt a decrease in low back/SIJ discomfort.    Personal Factors and Comorbidities Comorbidity 3+    Comorbidities bil TKA & THA with Rt revisions, h/o BPPV    Examination-Activity Limitations Bed Mobility;Sit;Bend;Squat;Sleep;Stairs;Stand;Transfers;Locomotion Level    Examination-Participation Restrictions Occupation;Community Activity;Driving    Stability/Clinical Decision Making Evolving/Moderate complexity    Clinical Decision Making Moderate    Rehab Potential Good    PT Frequency 2x / week    PT Duration --   15 visits   PT Treatment/Interventions ADLs/Self Care Home Management;Cryotherapy;Functional mobility training;Stair training;Gait training;Aquatic Therapy;Therapeutic activities;Therapeutic exercise;Balance  training;Neuromuscular re-education;Patient/family education;Passive range of motion;Scar mobilization;Manual techniques;Taping    PT Next Visit Plan 3-layer hee lift in Lt shoe outcome? gross L strength, adominal engagement    PT Home Exercise Plan TBD in pool    Consulted and Agree with Plan of Care Patient             Patient will benefit from skilled therapeutic intervention in order to improve the following deficits and impairments:  Abnormal gait, Impaired sensation, Decreased activity tolerance, Decreased strength, Pain, Increased muscle spasms, Difficulty walking, Improper body mechanics, Decreased range of motion, Postural dysfunction  Visit Diagnosis: Pain in right hip - Plan: PT plan of care cert/re-cert  Pain in right foot - Plan: PT plan of care cert/re-cert  Difficulty in walking, not elsewhere classified - Plan: PT plan of care cert/re-cert  Muscle weakness (generalized) - Plan: PT plan of care  cert/re-cert     Problem List Patient Active Problem List   Diagnosis Date Noted   Pneumonia due to COVID-19 virus 10/08/2020   Upper airway cough syndrome 10/08/2020   S/P right TKA 12/16/2019   Status post total knee replacement, right 12/16/2019   Hip fracture ([place]) 11/13/2018   Chronic anemia 11/06/2018   Reactive airway disease 11/06/2018   Femoral fracture ([place]) 11/05/2018   Morbid obesity ([place]) 12/20/2016   S/P left THA, AA 12/19/2016   Chest pain 08/15/2016   S/P right TH revision 02/09/2016   Hip instability 02/05/2016   H/O total hip arthroplasty 02/05/2016   S/P right THA, AA 02/01/2016   S/P knee replacement 11/27/2014   Unspecified viral hepatitis C without hepatic coma 08/28/2014   DYSPHONIA, CHRONIC 01/06/2010   NONSPECIFIC ABNORMAL ELECTROCARDIOGRAM 01/06/2010   HOARSENESS, CHRONIC 05/31/2009   Hypothyroidism 04/09/2009   Hyperlipidemia LDL goal <100 04/09/2009   HTN (hypertension) 04/09/2009   GERD (gastroesophageal reflux disease) 04/09/2009   Sleep apnea 04/09/2009   Lasaro Primm C. Hamilton Marinello PT, DPT 06/02/21 8:15 PM  [facility] [address] [place], [place], [address] Phone: [phone]   Fax:  [phone]  Name: Samuel Goodwin MRN: [id number] Date of Birth: 09/02/1961

## 2021-06-09 ENCOUNTER — Other Ambulatory Visit: Payer: Self-pay

## 2021-06-09 ENCOUNTER — Ambulatory Visit (HOSPITAL_BASED_OUTPATIENT_CLINIC_OR_DEPARTMENT_OTHER): Payer: No Typology Code available for payment source | Admitting: Physical Therapy

## 2021-06-09 ENCOUNTER — Encounter (HOSPITAL_BASED_OUTPATIENT_CLINIC_OR_DEPARTMENT_OTHER): Payer: Self-pay | Admitting: Physical Therapy

## 2021-06-09 DIAGNOSIS — M25551 Pain in right hip: Secondary | ICD-10-CM

## 2021-06-09 DIAGNOSIS — R262 Difficulty in walking, not elsewhere classified: Secondary | ICD-10-CM

## 2021-06-09 DIAGNOSIS — M79671 Pain in right foot: Secondary | ICD-10-CM

## 2021-06-09 DIAGNOSIS — M6281 Muscle weakness (generalized): Secondary | ICD-10-CM

## 2021-06-09 NOTE — Therapy (Signed)
[place], [place], [address] Phone: [phone]   Fax:  [phone]  Physical Therapy Treatment  Patient Details  Name: Samuel Goodwin MRN: [id number] Date of Birth: 09/28/1961 Referring Provider (PT): Tawnya Crook, MD   Encounter Date: 06/09/2021   PT End of Session - 06/09/21 1538     Visit Number 2    Number of Visits 15    Date for PT Re-Evaluation 07/29/21    Authorization Type [facility]    PT Start Time 7096    PT Stop Time 85    PT Time Calculation (min) 59 min    Equipment Utilized During Treatment Other (comment)   Ankle cuff and waist buoys, kick board, noodle/sqoodle, nekdoodle, weights and barbells   Activity Tolerance Patient tolerated treatment well    Behavior During Therapy WFL for tasks assessed/performed             Past Medical History:  Diagnosis Date   Alcohol abuse    Sobriety since 1990   Anxiety    Arthritis    oa   Benign positional vertigo    Depression    Eardrum trauma    30 years ago/right ear   GERD (gastroesophageal reflux disease)    Headache    sinus   Hepatitis C 1994   hep c - ? 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   COLONOSCOPY  03/2013   HIP CLOSED REDUCTION Right 02/05/2016   Procedure: CLOSED MANIPULATION HIP;  Surgeon: Latanya Maudlin, MD;  [facility];  Service: Orthopedics;  Laterality: Right;   knee arthroscopic Bilateral    LARYNGOSCOPY  08/2009   Dr.[name]   NASAL FRACTURE SURGERY     age 37   right knee arthroscopic knee surgery  12 yrs ago   dr Theda Sers   THROAT SURGERY  aug, sept, Nov 16 2016   vocal cord  laser sugery X 3, Dr [name] , [facility]   TOTAL HIP ARTHROPLASTY Right 02/01/2016   Procedure: RIGHT TOTAL HIP ARTHROPLASTY ANTERIOR APPROACH;  Surgeon: Paralee Cancel, MD;  [facility];  Service: Orthopedics;  Laterality: Right;   TOTAL HIP ARTHROPLASTY Left 12/19/2016   Procedure: LEFT TOTAL HIP ARTHROPLASTY ANTERIOR APPROACH;  Surgeon: Paralee Cancel, MD;  [facility];  Service: Orthopedics;  Laterality: Left;   TOTAL HIP REVISION Right 11/11/2018   Procedure: right total hip arthroplasty revision, femoral stem;  Surgeon: Paralee Cancel, MD;  [facility];  Service: Orthopedics;  Laterality: Right;  54min   TOTAL KNEE ARTHROPLASTY Left 11/27/2014   Procedure: LEFT TOTAL  KNEE ARTHROPLASTY;  Surgeon: Augustin Schooling, MD;  Location: [facility];  Service: Orthopedics;  Laterality: Left;   TOTAL KNEE ARTHROPLASTY Right 12/16/2019   Procedure: TOTAL KNEE ARTHROPLASTY;  Surgeon: Paralee Cancel, MD;  [facility];  Service: Orthopedics;  Laterality: Right;  70 mins   TRANSTHORACIC ECHOCARDIOGRAM  11/08/2005   EF 68%, normal LV systolic function   UPPER GASTROINTESTINAL ENDOSCOPY  2010   Negative, Dr.[name]    There were no vitals filed for this visit.   Subjective Assessment - 06/09/21 1643     Subjective Pt reviews above/Hx.  He reports being very active and has been coming to pool frequentlt since joining.  States his legs have been "misbehaving" today moreso than yesterday.            Pt seen for aquatic therapy today.  Treatment  took place in water 3.25-4.8 ft in depth at the [facility] pool. Temp of water was 91.  Pt entered/exited the pool via stairs step to pattern independently with bilat rail.  [place] Water walking forward, back and side stepping increasing step length and speed, cuing for UE placement to increase resistance  Seated Gastroc, hamstring and adductor stretching  Superman/Prone Suspended by thick squoodle and ankle foam holding position x 1 mins for abdominal stretch, gentle unilateral knees to chest then bilat knees to chest for LB stretch.  Same positioning with movements x 10 continuous for strengthening.  Jack knife x 10.  Pt stabilized and given cuing for technique and completion by therapist.  Standing Hip adduction/ abduction resisted by ankle buoys 2 x 10 reps, hip circles both directions x 10 reps.  Instruction on core muscles and maintaining contracted positions for core strength and balance. Hip flex stretching using noodle pt facing wall 30 sec x 3 trials then x 10 reps pull through hip flex strengthening.  Vertically suspended on thick squoodle holding hand buoys Bicycling: interval training fast x 20 second resting x 40 seconds x 5 mins.   Pt requires buoyancy for support and to offload joints with strengthening exercises. Viscosity of the water is needed for resistance of strengthening; water current perturbations provides challenge to standing balance unsupported, requiring increased core activation.                            PT Education - 06/09/21 1644     Education Details Properties of water, benefits.    Person(s) Educated Patient    Methods Explanation    Comprehension Verbalized understanding                 PT Long Term Goals - 06/02/21 1323       PT LONG TERM GOAL #1   Title I would like to be able to climb stairs using my Right leg    Baseline unable at eval    Time 8    Period Weeks    Status New    Target Date  07/29/21      PT LONG TERM GOAL #2   Title Walk normal    Baseline I feel like I wobble like a penguin    Time 8    Period Weeks    Status New    Target Date 07/29/21      PT LONG TERM GOAL #3   Title build up leg strength and strengthen the core    Baseline HEP to be est    Time 8  Period Weeks    Status New    Target Date 07/29/21      PT LONG TERM GOAL #4   Title reduce Rt foot numbness    Baseline gets numb after 5 min in the car    Time 8    Period Weeks    Status New    Target Date 07/29/21                   Plan - 06/09/21 1647     Clinical Impression Statement Pt presents today looking forward to the therapy.  Voices wanting to get stronger and lose weight to get rid of some chronic pain.  He is comfortable in aquatic setting, very motivated.  Initially therapist assessing ROM and strenght and pain.  Put pt in a variety of positions in which he tolerated all of well exercising and stretching.  He does c/o various aches and pains although overall responds positively gaining good muscle activation with lengthening of muscle groups.  He is an excellent cadidate for aquatic therapy.    Personal Factors and Comorbidities Comorbidity 3+    Examination-Participation Restrictions Occupation;Community Activity;Driving    PT Treatment/Interventions ADLs/Self Care Home Management;Cryotherapy;Functional mobility training;Stair training;Gait training;Aquatic Therapy;Therapeutic activities;Therapeutic exercise;Balance training;Neuromuscular re-education;Patient/family education;Passive range of motion;Scar mobilization;Manual techniques;Taping    PT Home Exercise Plan Bicycling usinging squoodle and hand buoys for suspension completing intervals 20 secs on, 40 sec off x 5 mins, Hip flex stretch and strengthening using noodle, holding to wall, pulling knee towards wall, add/abd using ankle buoys with tight core 2 x 10 reps.    Consulted and Agree with Plan of Care Patient              Patient will benefit from skilled therapeutic intervention in order to improve the following deficits and impairments:  Abnormal gait, Impaired sensation, Decreased activity tolerance, Decreased strength, Pain, Increased muscle spasms, Difficulty walking, Improper body mechanics, Decreased range of motion, Postural dysfunction  Visit Diagnosis: Pain in right hip  Pain in right foot  Difficulty in walking, not elsewhere classified  Muscle weakness (generalized)     Problem List Patient Active Problem List   Diagnosis Date Noted   Pneumonia due to COVID-19 virus 10/08/2020   Upper airway cough syndrome 10/08/2020   S/P right TKA 12/16/2019   Status post total knee replacement, right 12/16/2019   Hip fracture ([place]) 11/13/2018   Chronic anemia 11/06/2018   Reactive airway disease 11/06/2018   Femoral fracture ([place]) 11/05/2018   Morbid obesity ([place]) 12/20/2016   S/P left THA, AA 12/19/2016   Chest pain 08/15/2016   S/P right TH revision 02/09/2016   Hip instability 02/05/2016   H/O total hip arthroplasty 02/05/2016   S/P right THA, AA 02/01/2016   S/P knee replacement 11/27/2014   Unspecified viral hepatitis C without hepatic coma 08/28/2014   DYSPHONIA, CHRONIC 01/06/2010   NONSPECIFIC ABNORMAL ELECTROCARDIOGRAM 01/06/2010   HOARSENESS, CHRONIC 05/31/2009   Hypothyroidism 04/09/2009   Hyperlipidemia LDL goal <100 04/09/2009   HTN (hypertension) 04/09/2009   GERD (gastroesophageal reflux disease) 04/09/2009   Sleep apnea 04/09/2009    Vedia Pereyra  MPT 06/09/2021, 5:05 PM  [facility] [address] [place], [place], [address] Phone: [phone]   Fax:  [phone]  Name: Nolin Grell MRN: [id number] Date of Birth: [birth year]

## 2021-06-16 ENCOUNTER — Ambulatory Visit (HOSPITAL_BASED_OUTPATIENT_CLINIC_OR_DEPARTMENT_OTHER): Payer: No Typology Code available for payment source | Attending: Family Medicine | Admitting: Physical Therapy

## 2021-06-16 ENCOUNTER — Other Ambulatory Visit: Payer: Self-pay

## 2021-06-16 ENCOUNTER — Encounter (HOSPITAL_BASED_OUTPATIENT_CLINIC_OR_DEPARTMENT_OTHER): Payer: Self-pay | Admitting: Physical Therapy

## 2021-06-16 DIAGNOSIS — M79671 Pain in right foot: Secondary | ICD-10-CM | POA: Insufficient documentation

## 2021-06-16 DIAGNOSIS — M6281 Muscle weakness (generalized): Secondary | ICD-10-CM | POA: Insufficient documentation

## 2021-06-16 DIAGNOSIS — M25551 Pain in right hip: Secondary | ICD-10-CM | POA: Diagnosis present

## 2021-06-16 DIAGNOSIS — R262 Difficulty in walking, not elsewhere classified: Secondary | ICD-10-CM | POA: Diagnosis present

## 2021-06-16 NOTE — Therapy (Signed)
[place], [place], [address] Phone: [phone]   Fax:  [phone]  Physical Therapy Treatment  Patient Details  Name: Samuel Goodwin MRN: [id number] Date of Birth: 1961/10/26 Referring Provider (PT): Tawnya Crook, MD   Encounter Date: 06/16/2021   PT End of Session - 06/16/21 1809     Visit Number 3    Number of Visits 15    Date for PT Re-Evaluation 07/29/21    PT Start Time 3329    PT Stop Time 5188    PT Time Calculation (min) 44 min    Equipment Utilized During Treatment Other (comment)    Activity Tolerance Patient tolerated treatment well    Behavior During Therapy WFL for tasks assessed/performed             Past Medical History:  Diagnosis Date   Alcohol abuse    Sobriety since 1990   Anxiety    Arthritis    oa   Benign positional vertigo    Depression    Eardrum trauma    30 years ago/right ear   GERD (gastroesophageal reflux disease)    Headache    sinus   Hepatitis C 1994   hep c - ? 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   COLONOSCOPY  03/2013   HIP CLOSED REDUCTION Right 02/05/2016    Procedure: CLOSED MANIPULATION HIP;  Surgeon: Latanya Maudlin, MD;  Location: [facility];  Service: Orthopedics;  Laterality: Right;   knee arthroscopic Bilateral    LARYNGOSCOPY  08/2009   Dr.[name]   NASAL FRACTURE SURGERY     age 60   right knee arthroscopic knee surgery  12 yrs ago   dr Theda Sers   THROAT SURGERY  aug, sept, Nov 16 2016   vocal cord  laser sugery X 3, Dr [name] , [facility]   TOTAL HIP ARTHROPLASTY Right 02/01/2016   Procedure: RIGHT TOTAL HIP ARTHROPLASTY ANTERIOR APPROACH;  Surgeon: Paralee Cancel, MD;  Location[facility];  Service: Orthopedics;  Laterality: Right;   TOTAL HIP ARTHROPLASTY Left 12/19/2016   Procedure: LEFT TOTAL HIP ARTHROPLASTY ANTERIOR APPROACH;  Surgeon: Paralee Cancel, MD;  [facility];  Service: Orthopedics;  Laterality: Left;   TOTAL HIP REVISION Right 11/11/2018   Procedure: right total hip arthroplasty revision, femoral stem;  Surgeon: Paralee Cancel, MD;  Location: [facility];  Service: Orthopedics;  Laterality: Right;  11min   TOTAL KNEE ARTHROPLASTY Left 11/27/2014   Procedure: LEFT TOTAL KNEE ARTHROPLASTY;  Surgeon: Augustin Schooling, MD;  Location: [facility];  Service: Orthopedics;  Laterality: Left;  TOTAL KNEE ARTHROPLASTY Right 12/16/2019   Procedure: TOTAL KNEE ARTHROPLASTY;  Surgeon: Paralee Cancel, MD;  [facility];  Service: Orthopedics;  Laterality: Right;  70 mins   TRANSTHORACIC ECHOCARDIOGRAM  11/08/2005   EF 68%, normal LV systolic function   UPPER GASTROINTESTINAL ENDOSCOPY  2010   Negative, Dr.[name]    There were no vitals filed for this visit.   Subjective Assessment - 06/16/21 1807     Subjective "Had a string of 7-8 good days, thats unheardof"  I have been coming to the pool and doing everything you told me    Currently in Pain? No/denies                         Pt seen for aquatic therapy today.  Treatment took place in water 3.25-4.8 ft in depth at the [facility] pool. Temp of water was 91.  Pt entered/exited  the pool via stairs step to pattern independently with bilat rail.   Warm up Water walking forward, back and side stepping increasing step length and speed, cuing for UE placement to increase resistance   Seated Gastroc, hamstring and adductor stretching 3 x 30 sec each  Standing Hip adduction/ abduction resisted by ankle buoys 2 x 10 reps, hip circles both directions x 10 reps.  Instruction on core muscles and maintaining contracted positions for core strength and balance. Hip flex stretching using noodle pt facing wall 30 sec x 3 trials then x 10 reps pull through hip flex strengthening. Squoodle kick down 2 x 10 reps bilat hip flex then in external hip rotation initiated holding to wall, advanced to holding to triangular hand buoys.  Cuing for core tightness, multiple LOB while completing Standing wide BOS on squoodle. Cuing for gaining balance Ue's unsupported heels touch floor, rotating over squoodle then toes touching floor, back and forth with challenge to complete 5 rotations without LOB.  Pt with multiple attempts, best x 3 rotations. Standing on squoodle unsupported.  Pt best time 10 seconds.  Cuing though for posture and core stabilization to maintain position.   Vertically suspended  gentle unilateral knees to chest then bilat knees to chest for LB stretch.  Same positioning with movements x 10 continuous for strengthening.  Jack knife x 10.  Pt stabilized and given cuing for technique and completion by therapist.    Pt requires buoyancy for support and to offload joints with strengthening exercises. Viscosity of the water is needed for resistance of strengthening; water current perturbations provides challenge to standing balance unsupported, requiring increased core activation.                         PT Long Term Goals - 06/02/21 1323       PT LONG TERM GOAL #1   Title I would like to be able to climb stairs using my Right leg    Baseline unable at eval     Time 8    Period Weeks    Status New    Target Date 07/29/21      PT LONG TERM GOAL #2   Title Walk normal    Baseline I feel like I wobble like a penguin    Time 8    Period Weeks    Status New    Target Date 07/29/21      PT LONG TERM GOAL #3   Title build up leg strength and strengthen the core  Baseline HEP to be est    Time 8    Period Weeks    Status New    Target Date 07/29/21      PT LONG TERM GOAL #4   Title reduce Rt foot numbness    Baseline gets numb after 5 min in the car    Time 8    Period Weeks    Status New    Target Date 07/29/21                   Plan - 06/16/21 1810     Clinical Impression Statement Pt with reports of improvements since addition of aquatic therapy.  He is compliant with HEP coming to pool a few times a week.  No increase in discomfort just feeling "good muscle workout".  Advance pt with use of squoodle for core strengthening and balance. Demonstrates right IT band tigtness.  Requires frequent cuing for core stability with completion of exercises today.  Is an Insurance account manager.    Personal Factors and Comorbidities Comorbidity 3+    Comorbidities bil TKA & THA with Rt revisions, h/o BPPV    Examination-Activity Limitations Bed Mobility;Sit;Bend;Squat;Sleep;Stairs;Stand;Transfers;Locomotion Level    Stability/Clinical Decision Making Evolving/Moderate complexity    Clinical Decision Making Moderate    Rehab Potential Good    PT Frequency 2x / week    PT Treatment/Interventions ADLs/Self Care Home Management;Cryotherapy;Functional mobility training;Stair training;Gait training;Aquatic Therapy;Therapeutic activities;Therapeutic exercise;Balance training;Neuromuscular re-education;Patient/family education;Passive range of motion;Scar mobilization;Manual techniques;Taping    PT Home Exercise Plan Bicycling usinging squoodle and hand buoys for suspension completing intervals 20 secs on, 40 sec off x 5 mins, Hip flex stretch and  strengthening using noodle, holding to wall, pulling knee towards wall, add/abd using ankle buoys with tight core 2 x 10 reps.             Patient will benefit from skilled therapeutic intervention in order to improve the following deficits and impairments:  Abnormal gait, Impaired sensation, Decreased activity tolerance, Decreased strength, Pain, Increased muscle spasms, Difficulty walking, Improper body mechanics, Decreased range of motion, Postural dysfunction  Visit Diagnosis: Pain in right hip  Pain in right foot  Difficulty in walking, not elsewhere classified  Muscle weakness (generalized)     Problem List Patient Active Problem List   Diagnosis Date Noted   Pneumonia due to COVID-19 virus 10/08/2020   Upper airway cough syndrome 10/08/2020   S/P right TKA 12/16/2019   Status post total knee replacement, right 12/16/2019   Hip fracture ([place]) 11/13/2018   Chronic anemia 11/06/2018   Reactive airway disease 11/06/2018   Femoral fracture ([place]) 11/05/2018   Morbid obesity ([place]) 12/20/2016   S/P left THA, AA 12/19/2016   Chest pain 08/15/2016   S/P right TH revision 02/09/2016   Hip instability 02/05/2016   H/O total hip arthroplasty 02/05/2016   S/P right THA, AA 02/01/2016   S/P knee replacement 11/27/2014   Unspecified viral hepatitis C without hepatic coma 08/28/2014   DYSPHONIA, CHRONIC 01/06/2010   NONSPECIFIC ABNORMAL ELECTROCARDIOGRAM 01/06/2010   HOARSENESS, CHRONIC 05/31/2009   Hypothyroidism 04/09/2009   Hyperlipidemia LDL goal <100 04/09/2009   HTN (hypertension) 04/09/2009   GERD (gastroesophageal reflux disease) 04/09/2009   Sleep apnea 04/09/2009    Vedia Pereyra  MPT 06/16/2021, 6:22 PM  [facility] [address] [place], [place], [address] Phone: [phone]   Fax:  [phone]  Name: Samuel Goodwin MRN: [id number] Date of Birth: [birth year]

## 2021-06-23 ENCOUNTER — Encounter (HOSPITAL_BASED_OUTPATIENT_CLINIC_OR_DEPARTMENT_OTHER): Payer: Self-pay | Admitting: Physical Therapy

## 2021-06-23 ENCOUNTER — Other Ambulatory Visit: Payer: Self-pay

## 2021-06-23 ENCOUNTER — Ambulatory Visit (HOSPITAL_BASED_OUTPATIENT_CLINIC_OR_DEPARTMENT_OTHER): Payer: No Typology Code available for payment source | Admitting: Physical Therapy

## 2021-06-23 DIAGNOSIS — M25551 Pain in right hip: Secondary | ICD-10-CM

## 2021-06-23 DIAGNOSIS — R262 Difficulty in walking, not elsewhere classified: Secondary | ICD-10-CM

## 2021-06-23 DIAGNOSIS — M79671 Pain in right foot: Secondary | ICD-10-CM

## 2021-06-23 DIAGNOSIS — M6281 Muscle weakness (generalized): Secondary | ICD-10-CM

## 2021-06-23 NOTE — Therapy (Signed)
[place], [place], [address] Phone: [phone]   Fax:  [phone]  Physical Therapy Treatment  Patient Details  Name: Samuel Goodwin MRN: [id number] Date of Birth: [birth year] Referring Provider (PT): Tawnya Crook, MD   Encounter Date: 06/23/2021   PT End of Session - 06/23/21 1755     Visit Number 4    Number of Visits 15    Date for PT Re-Evaluation 07/29/21    Authorization Type [facility]    PT Start Time 8099    PT Stop Time 8338    PT Time Calculation (min) 44 min    Equipment Utilized During Treatment Other (comment)    Activity Tolerance Patient tolerated treatment well             Past Medical History:  Diagnosis Date   Alcohol abuse    Sobriety since 1990   Anxiety    Arthritis    oa   Benign positional vertigo    Depression    Eardrum trauma    30 years ago/right ear   GERD (gastroesophageal reflux disease)    Headache    sinus   Hepatitis C 1994   hep c - ? 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   COLONOSCOPY  03/2013   HIP CLOSED REDUCTION Right 02/05/2016   Procedure: CLOSED MANIPULATION HIP;   Surgeon: Latanya Maudlin, MD;  Location: [facility];  Service: Orthopedics;  Laterality: Right;   knee arthroscopic Bilateral    LARYNGOSCOPY  08/2009   Dr.[name]   NASAL FRACTURE SURGERY     age 29   right knee arthroscopic knee surgery  12 yrs ago   dr Theda Sers   THROAT SURGERY  aug, sept, Nov 16 2016   vocal cord  laser sugery X 3, Dr [name] , [facility]   TOTAL HIP ARTHROPLASTY Right 02/01/2016   Procedure: RIGHT TOTAL HIP ARTHROPLASTY ANTERIOR APPROACH;  Surgeon: Paralee Cancel, MD;  Location: [facility];  Service: Orthopedics;  Laterality: Right;   TOTAL HIP ARTHROPLASTY Left 12/19/2016   Procedure: LEFT TOTAL HIP ARTHROPLASTY ANTERIOR APPROACH;  Surgeon: Paralee Cancel, MD;  Location: [facility];  Service: Orthopedics;  Laterality: Left;   TOTAL HIP REVISION Right 11/11/2018   Procedure: right total hip arthroplasty revision, femoral stem;  Surgeon: Paralee Cancel, MD;  Location: [facility];  Service: Orthopedics;  Laterality: Right;  15min   TOTAL KNEE ARTHROPLASTY Left 11/27/2014   Procedure: LEFT TOTAL KNEE ARTHROPLASTY;  Surgeon: Augustin Schooling, MD;  Location: [facility];  Service: Orthopedics;  Laterality: Left;   TOTAL KNEE ARTHROPLASTY  Right 12/16/2019   Procedure: TOTAL KNEE ARTHROPLASTY;  Surgeon: Paralee Cancel, MD;  [facility];  Service: Orthopedics;  Laterality: Right;  70 mins   TRANSTHORACIC ECHOCARDIOGRAM  11/08/2005   EF 68%, normal LV systolic function   UPPER GASTROINTESTINAL ENDOSCOPY  2010   Negative, Dr.[name]    There were no vitals filed for this visit.   Subjective Assessment - 06/23/21 1751     Subjective I over did it the other day working 21/24 hours and I paid for it.  My right knee sometimes gives on me.    Currently in Pain? Yes    Pain Score 3     Pain Location Knee    Pain Orientation Right    Pain Descriptors / Indicators Spasm;Sharp    Pain Type Chronic pain    Pain Onset 1 to 4 weeks ago    Pain Frequency Occasional    Aggravating Factors  standing too long    Effect  of Pain on Daily Activities Difficulty standing when it "hits"            Pt seen for aquatic therapy today.  Treatment took place in water 3.25-4.8 ft in depth at the [facility] pool. Temp of water was 91.  Pt entered/exited the pool via stairs step to pattern independently with bilat rail.   Warm up Water walking forward, back and side stepping increasing step length and speed, cuing for UE placement to increase resistance x 4 lengths of pool   Seated Gastroc, hamstring and adductor stretching 3 x 30 sec each   Standing Hip adduction/ abduction resisted by ankle buoys, hip flex, hip ext , knee flex/ext 2 x 10 reps. Continued cuing for  core muscles and maintaining contracted positions for core strength and balance. Step ups on bottom step forward, backward and sidestepping leading with each LE x 10 rep. Cuing for eccentric and concentric control with emphasis on rle  [place] On middle ladder step gentle unilateral knees to chest then bilat knees to chest for LB stretch x 5 reps.  Same positioning with movements x 10 continuous for strengthening.  Jack knife x 10.  Pt stabilized and given cuing for technique and completion by therapist.  Vertically suspended Bicycling x 5 min     Pt requires buoyancy for support and to offload joints with strengthening exercises. Viscosity of the water is needed for resistance of strengthening; water current perturbations provides challenge to standing balance unsupported, requiring increased core activation.                                 PT Long Term Goals - 06/02/21 1323       PT LONG TERM GOAL #1   Title I would like to be able to climb stairs using my Right leg    Baseline unable at eval    Time 8    Period Weeks    Status New    Target Date 07/29/21      PT LONG TERM GOAL #2   Title Walk normal    Baseline I feel like I wobble like a penguin    Time 8    Period Weeks    Status New     Target Date 07/29/21      PT LONG TERM GOAL #3   Title build up leg strength and strengthen the core    Baseline HEP to be est  Time 8    Period Weeks    Status New    Target Date 07/29/21      PT LONG TERM GOAL #4   Title reduce Rt foot numbness    Baseline gets numb after 5 min in the car    Time 8    Period Weeks    Status New    Target Date 07/29/21                   Plan - 06/23/21 1756     Clinical Impression Statement Pt reports compliance with HEP comig to the pool almost daily.  Focus on core strength and adding exercises he can complete on his own/HEP.  Worked on right knee eccentric and concentric control with stepping up onto bottom step forward adn backward. He puts forth excellent effort.    Personal Factors and Comorbidities Comorbidity 3+    PT Treatment/Interventions ADLs/Self Care Home Management;Cryotherapy;Functional mobility training;Stair training;Gait training;Aquatic Therapy;Therapeutic activities;Therapeutic exercise;Balance training;Neuromuscular re-education;Patient/family education;Passive range of motion;Scar mobilization;Manual techniques;Taping    PT Home Exercise Plan Bicycling usinging squoodle and hand buoys for suspension completing intervals 20 secs on, 40 sec off x 5 mins, Hip flex stretch and strengthening using noodle, holding to wall, pulling knee towards wall, add/abd using ankle buoys with tight core 2 x 10 reps. Hip flex/extension, add/abd and hip extension 2 x 10 rep             Patient will benefit from skilled therapeutic intervention in order to improve the following deficits and impairments:  Abnormal gait, Impaired sensation, Decreased activity tolerance, Decreased strength, Pain, Increased muscle spasms, Difficulty walking, Improper body mechanics, Decreased range of motion, Postural dysfunction  Visit Diagnosis: Pain in right hip  Pain in right foot  Difficulty in walking, not elsewhere classified  Muscle weakness  (generalized)     Problem List Patient Active Problem List   Diagnosis Date Noted   Pneumonia due to COVID-19 virus 10/08/2020   Upper airway cough syndrome 10/08/2020   S/P right TKA 12/16/2019   Status post total knee replacement, right 12/16/2019   Hip fracture ([place]) 11/13/2018   Chronic anemia 11/06/2018   Reactive airway disease 11/06/2018   Femoral fracture ([place]) 11/05/2018   Morbid obesity ([place]) 12/20/2016   S/P left THA, AA 12/19/2016   Chest pain 08/15/2016   S/P right TH revision 02/09/2016   Hip instability 02/05/2016   H/O total hip arthroplasty 02/05/2016   S/P right THA, AA 02/01/2016   S/P knee replacement 11/27/2014   Unspecified viral hepatitis C without hepatic coma 08/28/2014   DYSPHONIA, CHRONIC 01/06/2010   NONSPECIFIC ABNORMAL ELECTROCARDIOGRAM 01/06/2010   HOARSENESS, CHRONIC 05/31/2009   Hypothyroidism 04/09/2009   Hyperlipidemia LDL goal <100 04/09/2009   HTN (hypertension) 04/09/2009   GERD (gastroesophageal reflux disease) 04/09/2009   Sleep apnea 04/09/2009    Vedia Pereyra MOT 06/23/2021, 6:15 PM  [facility] [address] [place], [place], [address] Phone: [phone]   Fax:  [phone]  Name: Samuel Goodwin MRN: [id number] Date of Birth: Apr 12, 1961

## 2021-06-30 ENCOUNTER — Other Ambulatory Visit: Payer: Self-pay

## 2021-06-30 ENCOUNTER — Ambulatory Visit (HOSPITAL_BASED_OUTPATIENT_CLINIC_OR_DEPARTMENT_OTHER): Payer: No Typology Code available for payment source | Admitting: Physical Therapy

## 2021-06-30 DIAGNOSIS — R262 Difficulty in walking, not elsewhere classified: Secondary | ICD-10-CM

## 2021-06-30 DIAGNOSIS — M79671 Pain in right foot: Secondary | ICD-10-CM

## 2021-06-30 DIAGNOSIS — M25551 Pain in right hip: Secondary | ICD-10-CM | POA: Diagnosis not present

## 2021-06-30 DIAGNOSIS — M6281 Muscle weakness (generalized): Secondary | ICD-10-CM

## 2021-06-30 NOTE — Therapy (Signed)
[place], [place], [address] Phone: [phone]   Fax:  [phone]  Physical Therapy Treatment  Patient Details  Name: Samuel Goodwin MRN: [id number] Date of Birth: May 09, 1961 Referring Provider (PT): Tawnya Crook, MD   Encounter Date: 06/30/2021   PT End of Session - 06/30/21 1629     Visit Number 5    Number of Visits 15    Date for PT Re-Evaluation 07/29/21    Authorization Type [facility]    PT Start Time [id number]    PT Stop Time [id number]    PT Time Calculation (min) 40 min    Equipment Utilized During Treatment Other (comment)    Activity Tolerance Patient tolerated treatment well             Past Medical History:  Diagnosis Date   Alcohol abuse    Sobriety since 1990   Anxiety    Arthritis    oa   Benign positional vertigo    Depression    Eardrum trauma    30 years ago/right ear   GERD (gastroesophageal reflux disease)    Headache    sinus   Hepatitis C 1994   hep c - ? 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   COLONOSCOPY  03/2013   HIP CLOSED REDUCTION Right 02/05/2016   Procedure: CLOSED MANIPULATION HIP;   Surgeon: Latanya Maudlin, MD;  Location: [facility];  Service: Orthopedics;  Laterality: Right;   knee arthroscopic Bilateral    LARYNGOSCOPY  08/2009   Dr.[name]   NASAL FRACTURE SURGERY     age 87   right knee arthroscopic knee surgery  12 yrs ago   dr Theda Sers   THROAT SURGERY  aug, sept, Nov 16 2016   vocal cord  laser sugery X 3, Dr [name] , [facility]   TOTAL HIP ARTHROPLASTY Right 02/01/2016   Procedure: RIGHT TOTAL HIP ARTHROPLASTY ANTERIOR APPROACH;  Surgeon: Paralee Cancel, MD;  Location: [facility];  Service: Orthopedics;  Laterality: Right;   TOTAL HIP ARTHROPLASTY Left 12/19/2016   Procedure: LEFT TOTAL HIP ARTHROPLASTY ANTERIOR APPROACH;  Surgeon: Paralee Cancel, MD;  [facility];  Service: Orthopedics;  Laterality: Left;   TOTAL HIP REVISION Right 11/11/2018   Procedure: right total hip arthroplasty revision, femoral stem;  Surgeon: Paralee Cancel, MD;  Location: [facility];  Service: Orthopedics;  Laterality: Right;  74mn   TOTAL KNEE ARTHROPLASTY Left 11/27/2014   Procedure: LEFT TOTAL KNEE ARTHROPLASTY;  Surgeon: SAugustin Schooling MD;  [facility]  Service: Orthopedics;  Laterality: Left;   TOTAL KNEE ARTHROPLASTY  Right 12/16/2019   Procedure: TOTAL KNEE ARTHROPLASTY;  Surgeon: Paralee Cancel, MD;  [facility];  Service: Orthopedics;  Laterality: Right;  70 mins   TRANSTHORACIC ECHOCARDIOGRAM  11/08/2005   EF 68%, normal LV systolic function   UPPER GASTROINTESTINAL ENDOSCOPY  2010   Negative, Dr.[name]    There were no vitals filed for this visit.   Subjective Assessment - 06/30/21 1624     Subjective Did a new exercise (some kind of modified squat) at OP last visit and I had terrible right groin spasms afterward.  Almost went to ER but they subsided.  I have been very careful with my exercises since then"    Currently in Pain? Yes    Pain Score 3     Pain Location Knee    Pain Orientation Right    Pain Descriptors / Indicators Spasm    Pain Type Chronic pain    Pain Radiating  Towards right hip    Pain Frequency Intermittent    Aggravating Factors  squats (hip)                                            PT Long Term Goals - 06/02/21 1323       PT LONG TERM GOAL #1   Title I would like to be able to climb stairs using my Right leg    Baseline unable at eval    Time 8    Period Weeks    Status New    Target Date 07/29/21      PT LONG TERM GOAL #2   Title Walk normal    Baseline I feel like I wobble like a penguin    Time 8    Period Weeks    Status New    Target Date 07/29/21      PT LONG TERM GOAL #3   Title build up leg strength and strengthen the core    Baseline HEP to be est    Time 8    Period Weeks    Status New    Target Date 07/29/21      PT LONG TERM GOAL #4   Title reduce Rt foot numbness    Baseline gets numb after 5 min in the car    Time 8    Period Weeks    Status New    Target Date 07/29/21             Pt seen for aquatic therapy today.  Treatment took place in water 3.25-4.8 ft in depth at the [facility] pool. Temp of water was 91.  Pt entered/exited the pool via stairs step to pattern independently with bilat rail.   Warm up Water walking forward, back and side stepping increasing step length and speed, cuing for UE placement to increase resistance x 4 lengths of pool   Seated Gastroc, hamstring and adductor stretching 3 x 30 sec each   Standing Hip, quad  and core stretching facing wall with noodle instruction given for body awareness and  avoiding aggressive stretch    [place] Water bench Static position hold x 5 min for core extension stretch gentle unilateral knees to chest then bilat knees to chest for LB stretch x 5 reps.  Same positioning with movements x 5 continuous for strengthening.  Jack knife x 10 .  Pt stabilized and given  cuing for technique and completion by therapist.   Vertically suspended Bicycling x 5 min Pt instructed to go at 50%      Pt  requires buoyancy for support and to offload joints with strengthening exercises. Viscosity of the water is needed for resistance of strengthening; water current perturbations provides challenge to standing balance unsupported, requiring increased core activation.       Plan - 06/30/21 1617     Clinical Impression Statement Pt with spasm event which lasted for ~ 1 hour 2 nights ago.  Only addition or change in activity was a new exercise in which he completed at OP.  Said at the time it seemed fine although was challenging. Episode passed and has been fine since.  Been taking it easy during self workouts/HEP. With palpation pt is tender in right groin area.  Focus on stretching into extension LB, hips and quads, external hip rotation.  Pt with some mild discomfort in groin with session.  Re- instructed Pt on positioning with quad stretching.  Pt Edu on use of ms relaxers and ice packs.    Personal Factors and Comorbidities Comorbidity 3+    Comorbidities bil TKA & THA with Rt revisions, h/o BPPV    Examination-Activity Limitations Bed Mobility;Sit;Bend;Squat;Sleep;Stairs;Stand;Transfers;Locomotion Level    Stability/Clinical Decision Making Evolving/Moderate complexity    Clinical Decision Making Moderate    Rehab Potential Good    PT Frequency 2x / week    PT Duration 4 weeks    PT Treatment/Interventions ADLs/Self Care Home Management;Cryotherapy;Functional mobility training;Stair training;Gait training;Aquatic Therapy;Therapeutic activities;Therapeutic exercise;Balance training;Neuromuscular re-education;Patient/family education;Passive range of motion;Scar mobilization;Manual techniques;Taping             Patient will benefit from skilled therapeutic intervention in order to improve the following deficits and impairments:  Abnormal gait, Impaired sensation, Decreased activity tolerance, Decreased strength, Pain, Increased muscle spasms, Difficulty walking, Improper body mechanics,  Decreased range of motion, Postural dysfunction  Visit Diagnosis: Pain in right hip  Pain in right foot  Difficulty in walking, not elsewhere classified  Muscle weakness (generalized)     Problem List Patient Active Problem List   Diagnosis Date Noted   Pneumonia due to COVID-19 virus 10/08/2020   Upper airway cough syndrome 10/08/2020   S/P right TKA 12/16/2019   Status post total knee replacement, right 12/16/2019   Hip fracture ([place]) 11/13/2018   Chronic anemia 11/06/2018   Reactive airway disease 11/06/2018   Femoral fracture ([place]) 11/05/2018   Morbid obesity ([place]) 12/20/2016   S/P left THA, AA 12/19/2016   Chest pain 08/15/2016   S/P right TH revision 02/09/2016   Hip instability 02/05/2016   H/O total hip arthroplasty 02/05/2016   S/P right THA, AA 02/01/2016   S/P knee replacement 11/27/2014   Unspecified viral hepatitis C without hepatic coma 08/28/2014   DYSPHONIA, CHRONIC 01/06/2010   NONSPECIFIC ABNORMAL ELECTROCARDIOGRAM 01/06/2010   HOARSENESS, CHRONIC 05/31/2009   Hypothyroidism 04/09/2009   Hyperlipidemia LDL goal <100 04/09/2009   HTN (hypertension) 04/09/2009   GERD (gastroesophageal reflux disease) 04/09/2009   Sleep apnea 04/09/2009    Vedia Pereyra  MPT 06/30/2021, 4:40 PM  [facility] [address] [place], [place], [address] Phone: [phone]   Fax:  [phone]  Name: Samuel Goodwin MRN: [id number] Date of Birth: 04/03/1961

## 2021-07-05 ENCOUNTER — Ambulatory Visit (HOSPITAL_BASED_OUTPATIENT_CLINIC_OR_DEPARTMENT_OTHER): Payer: Self-pay | Admitting: Physical Therapy

## 2021-07-12 ENCOUNTER — Ambulatory Visit (HOSPITAL_BASED_OUTPATIENT_CLINIC_OR_DEPARTMENT_OTHER): Payer: Self-pay | Admitting: Physical Therapy

## 2021-07-20 ENCOUNTER — Encounter (HOSPITAL_BASED_OUTPATIENT_CLINIC_OR_DEPARTMENT_OTHER): Payer: Self-pay | Admitting: Physical Therapy

## 2021-07-20 ENCOUNTER — Other Ambulatory Visit: Payer: Self-pay

## 2021-07-20 ENCOUNTER — Ambulatory Visit (HOSPITAL_BASED_OUTPATIENT_CLINIC_OR_DEPARTMENT_OTHER): Payer: No Typology Code available for payment source | Attending: Family Medicine | Admitting: Physical Therapy

## 2021-07-20 ENCOUNTER — Other Ambulatory Visit: Payer: Self-pay | Admitting: Internal Medicine

## 2021-07-20 DIAGNOSIS — M25551 Pain in right hip: Secondary | ICD-10-CM | POA: Insufficient documentation

## 2021-07-20 DIAGNOSIS — R262 Difficulty in walking, not elsewhere classified: Secondary | ICD-10-CM | POA: Diagnosis present

## 2021-07-20 DIAGNOSIS — M79671 Pain in right foot: Secondary | ICD-10-CM | POA: Diagnosis present

## 2021-07-20 DIAGNOSIS — M6281 Muscle weakness (generalized): Secondary | ICD-10-CM | POA: Diagnosis present

## 2021-07-20 NOTE — Therapy (Signed)
[place], [place], [address] Phone: [phone]   Fax:  [phone]  Physical Therapy Treatment/Re-cert  Patient Details  Name: Samuel Goodwin MRN: [id number] Date of Birth: 07-Sep-1961 Referring Provider (PT): Tawnya Crook, MD   Encounter Date: 07/20/2021   PT End of Session - 07/20/21 1538     Visit Number 6    Number of Visits 15    Date for PT Re-Evaluation 09/02/21    Authorization Type [facility]    Authorization - Number of Visits 15    PT Start Time 9485    PT Stop Time 1602    PT Time Calculation (min) 41 min    Activity Tolerance Patient tolerated treatment well    Behavior During Therapy WFL for tasks assessed/performed             Past Medical History:  Diagnosis Date   Alcohol abuse    Sobriety since 1990   Anxiety    Arthritis    oa   Benign positional vertigo    Depression    Eardrum trauma    30 years ago/right ear   GERD (gastroesophageal reflux disease)    Headache    sinus   Hepatitis C 1994   hep c - ? 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   COLONOSCOPY  03/2013   HIP CLOSED REDUCTION  Right 02/05/2016   Procedure: CLOSED MANIPULATION HIP;  Surgeon: Latanya Maudlin, MD;  [facility];  Service: Orthopedics;  Laterality: Right;   knee arthroscopic Bilateral    LARYNGOSCOPY  08/2009   Dr.[name]   NASAL FRACTURE SURGERY     age 52   right knee arthroscopic knee surgery  12 yrs ago   dr Theda Sers   THROAT SURGERY  aug, sept, Nov 16 2016   vocal cord  laser sugery X 3, Dr [name] , [facility]   TOTAL HIP ARTHROPLASTY Right 02/01/2016   Procedure: RIGHT TOTAL HIP ARTHROPLASTY ANTERIOR APPROACH;  Surgeon: Paralee Cancel, MD;  [facility];  Service: Orthopedics;  Laterality: Right;   TOTAL HIP ARTHROPLASTY Left 12/19/2016   Procedure: LEFT TOTAL HIP ARTHROPLASTY ANTERIOR APPROACH;  Surgeon: Paralee Cancel, MD;  [facility];  Service: Orthopedics;  Laterality: Left;   TOTAL HIP REVISION Right 11/11/2018   Procedure: right total hip arthroplasty revision, femoral stem;  Surgeon: Paralee Cancel, MD;  Location: [facility];  Service: Orthopedics;  Laterality: Right;  38mn   TOTAL KNEE ARTHROPLASTY Left 11/27/2014   Procedure: LEFT TOTAL KNEE ARTHROPLASTY;  Surgeon: SAugustin Schooling MD;  [place]  Service: Orthopedics;  Laterality: Left;   TOTAL KNEE ARTHROPLASTY Right 12/16/2019   Procedure: TOTAL KNEE ARTHROPLASTY;  Surgeon: Paralee Cancel, MD;  [facility];  Service: Orthopedics;  Laterality: Right;  70 mins   TRANSTHORACIC ECHOCARDIOGRAM  11/08/2005   EF 68%, normal LV systolic function   UPPER GASTROINTESTINAL ENDOSCOPY  2010   Negative, Dr.[name]    There were no vitals filed for this visit.   Subjective Assessment - 07/20/21 1522     Subjective exercises my body doesn't like are side stepping over hurdles, bicycling with noodle in pool, side stepping in squats, flutter kicks. Was getting swelling around groin bil and puffy at Rt lat epicondyle. still unable to step up using Rt leg. for the first time I feel muscle in distal thigh. I noticed I am getting stabbing pains from  latera, mid Rt thigh and it shoots up to hip. Lift in Lt shoe seems to be working pretty well. Neck last week and week before was bad- backstrokes irritated the neck. I do the core in the pool and I just never know with the back.    Patient Stated Goals improve strength, improve how I walk    Currently in Pain? 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      Sensation   Additional Comments n/T still noted in Rt foot occasionally      Posture/Postural Control   Posture Comments lackign full hip ext d/t lack of glut activation      AROM   Overall AROM Comments supine both knees are extended with minmal to no difference but unable to extend fully in LAQ      Strength   Overall Strength Comments able to stand from elevated table without use of UEs, cues for glut strength; 3/5 Rt hip flexion      Palpation   Palpation comment no notable innominate rotation today      Ambulation/Gait   Gait Comments good arm swing but guarded trunk rotatio                           [facility] Adult PT Treatment/Exercise - 07/20/21 0001       Manual Therapy   Manual Therapy Soft tissue mobilization;Joint mobilization    Joint Mobilization long axis distraction, lateral distraction in hooklying Rt LE    Soft tissue mobilization trigger point release Rt TFL, peroneals, lateral gastroc                    PT Education - 07/20/21 2009     Education Details anatomy of condition, POC, trigger points & referral patterns, soft  heel lift vs hard, built one    Person(s) Educated Patient    Methods Explanation    Comprehension Verbalized understanding;Need further instruction  PT Long Term Goals - 07/20/21 2020       PT LONG TERM GOAL #1   Title I would like to be able to climb stairs using my Right leg    Baseline unable to press through Rt leg to climb up stairs at this time    Status On-going    Target Date 09/02/21      PT LONG TERM GOAL #2   Title Walk normal    Baseline significantly improved in form but limited by pain    Status Partially Met      PT LONG TERM GOAL #3   Title build up leg strength and strengthen the core    Baseline will cont to progress and establish long term HEP    Status On-going      PT LONG TERM GOAL #4   Title reduce Rt foot numbness    Baseline still gets numb in car- became numb from STM to peroneals today, maint of hip flexion in supine resulted in numbness    Status On-going                   Plan - 07/20/21 1538     Clinical Impression Statement Trigger point in Rt TFL reduced concordant pain and pectineus was not as tight as expected with complaints of hip discomfort around groin and gluteal fold. Pt was able to demo sit to stand from elevated table without use of UEs, he has significantly increased his time swimming after working with aquatic therapy thus far. Found that he has been underutilizing glut strength and activation for support and power in mobility which places increased weight in bil LEs. pt has a complex pain report including cerivcal region, bil hips, bil knees and poor tolerance to functional ADLs. Aquatic therapy continues to give him an opportunity to work on strengthening and stretching activities while removing intolerable weight on joints. Pt will cont to benefit from inclusion of aquatic rehab in his PT treatments for progression of functional strength and endurance.    PT Frequency 1x / week    PT Duration 6 weeks     PT Treatment/Interventions ADLs/Self Care Home Management;Cryotherapy;Functional mobility training;Stair training;Gait training;Aquatic Therapy;Therapeutic activities;Therapeutic exercise;Balance training;Neuromuscular re-education;Patient/family education;Passive range of motion;Scar mobilization;Manual techniques;Taping    PT Next Visit Plan cont 1/week in pool, glut strength    PT Home Exercise Plan Bicycling usinging squoodle and hand buoys for suspension completing intervals 20 secs on, 40 sec off x 5 mins, Hip flex stretch and strengthening using noodle, holding to wall, pulling knee towards wall, add/abd using ankle buoys with tight core 2 x 10 reps. Hip flex/extension, add/abd and hip extension 2 x 10 rep    Consulted and Agree with Plan of Care Patient             Patient will benefit from skilled therapeutic intervention in order to improve the following deficits and impairments:  Abnormal gait, Impaired sensation, Decreased activity tolerance, Decreased strength, Pain, Increased muscle spasms, Difficulty walking, Improper body mechanics, Decreased range of motion, Postural dysfunction  Visit Diagnosis: Pain in right hip - Plan: PT plan of care cert/re-cert  Pain in right foot - Plan: PT plan of care cert/re-cert  Difficulty in walking, not elsewhere classified - Plan: PT plan of care cert/re-cert  Muscle weakness (generalized) - Plan: PT plan of care cert/re-cert     Problem List Patient Active Problem List   Diagnosis Date Noted   Pneumonia due to COVID-19 virus  10/08/2020   Upper airway cough syndrome 10/08/2020   S/P right TKA 12/16/2019   Status post total knee replacement, right 12/16/2019   Hip fracture ([place]) 11/13/2018   Chronic anemia 11/06/2018   Reactive airway disease 11/06/2018   Femoral fracture ([place]) 11/05/2018   Morbid obesity ([place]) 12/20/2016   S/P left THA, AA 12/19/2016   Chest pain 08/15/2016   S/P right TH revision 02/09/2016   Hip instability  02/05/2016   H/O total hip arthroplasty 02/05/2016   S/P right THA, AA 02/01/2016   S/P knee replacement 11/27/2014   Unspecified viral hepatitis C without hepatic coma 08/28/2014   DYSPHONIA, CHRONIC 01/06/2010   NONSPECIFIC ABNORMAL ELECTROCARDIOGRAM 01/06/2010   HOARSENESS, CHRONIC 05/31/2009   Hypothyroidism 04/09/2009   Hyperlipidemia LDL goal <100 04/09/2009   HTN (hypertension) 04/09/2009   GERD (gastroesophageal reflux disease) 04/09/2009   Sleep apnea 04/09/2009   Gabrella Stroh C. Moise Friday PT, DPT 07/20/21 8:26 PM  [facility] [place], [place], [address] Phone: [phone]   Fax:  [phone]  Name: Samuel Goodwin MRN: [id number] Date of Birth: 21-Apr-1961

## 2021-07-21 ENCOUNTER — Other Ambulatory Visit: Payer: Self-pay

## 2021-07-21 MED ORDER — PANTOPRAZOLE SODIUM 40 MG PO TBEC: DELAYED_RELEASE_TABLET | ORAL | 2 refills | Status: DC

## 2021-07-26 ENCOUNTER — Ambulatory Visit (HOSPITAL_BASED_OUTPATIENT_CLINIC_OR_DEPARTMENT_OTHER): Payer: No Typology Code available for payment source | Admitting: Physical Therapy

## 2021-07-26 ENCOUNTER — Encounter (HOSPITAL_BASED_OUTPATIENT_CLINIC_OR_DEPARTMENT_OTHER): Payer: Self-pay | Admitting: Physical Therapy

## 2021-07-26 ENCOUNTER — Other Ambulatory Visit: Payer: Self-pay

## 2021-07-26 DIAGNOSIS — R262 Difficulty in walking, not elsewhere classified: Secondary | ICD-10-CM

## 2021-07-26 DIAGNOSIS — M25551 Pain in right hip: Secondary | ICD-10-CM

## 2021-07-26 DIAGNOSIS — M79671 Pain in right foot: Secondary | ICD-10-CM

## 2021-07-26 DIAGNOSIS — M6281 Muscle weakness (generalized): Secondary | ICD-10-CM

## 2021-07-26 NOTE — Therapy (Signed)
[place], [place], [address] Phone: [phone]   Fax:  [phone]  Physical Therapy Treatment  Patient Details  Name: Samuel Goodwin MRN: [id number] Date of Birth: 1961-06-07 Referring Provider (PT): Tawnya Crook, MD   Encounter Date: 07/26/2021   PT End of Session - 07/26/21 1456     Visit Number 7    Number of Visits 15    Date for PT Re-Evaluation 09/02/21    PT Start Time 0071    PT Stop Time 2197    PT Time Calculation (min) 46 min    Equipment Utilized During Treatment Other (comment)    Activity Tolerance Patient tolerated treatment well    Behavior During Therapy WFL for tasks assessed/performed             Past Medical History:  Diagnosis Date   Alcohol abuse    Sobriety since 1990   Anxiety    Arthritis    oa   Benign positional vertigo    Depression    Eardrum trauma    30 years ago/right ear   GERD (gastroesophageal reflux disease)    Headache    sinus   Hepatitis C 1994   hep c - ? 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   COLONOSCOPY  03/2013   HIP CLOSED REDUCTION Right 02/05/2016    Procedure: CLOSED MANIPULATION HIP;  Surgeon: Latanya Maudlin, MD;  [facility];  Service: Orthopedics;  Laterality: Right;   knee arthroscopic Bilateral    LARYNGOSCOPY  08/2009   Dr.[name]   NASAL FRACTURE SURGERY     age 60   right knee arthroscopic knee surgery  60 yrs ago   dr Theda Sers   THROAT SURGERY  aug, sept, Nov 16 2016   vocal cord  laser sugery X 3, Dr [name] , [facility]   TOTAL HIP ARTHROPLASTY Right 02/01/2016   Procedure: RIGHT TOTAL HIP ARTHROPLASTY ANTERIOR APPROACH;  Surgeon: Paralee Cancel, MD;  [facility];  Service: Orthopedics;  Laterality: Right;   TOTAL HIP ARTHROPLASTY Left 12/19/2016   Procedure: LEFT TOTAL HIP ARTHROPLASTY ANTERIOR APPROACH;  Surgeon: Paralee Cancel, MD;  [facility];  Service: Orthopedics;  Laterality: Left;   TOTAL HIP REVISION Right 11/11/2018   Procedure: right total hip arthroplasty revision, femoral stem;  Surgeon: Paralee Cancel, MD;  [facility];  Service: Orthopedics;  Laterality: Right;  27mn   TOTAL KNEE ARTHROPLASTY Left 11/27/2014   Procedure: LEFT TOTAL KNEE ARTHROPLASTY;  Surgeon: SAugustin Schooling MD;  Location: [facility]  Service: Orthopedics;  Laterality: Left;  TOTAL KNEE ARTHROPLASTY Right 12/16/2019   Procedure: TOTAL KNEE ARTHROPLASTY;  Surgeon: Paralee Cancel, MD;  [facility];  Service: Orthopedics;  Laterality: Right;  70 mins   TRANSTHORACIC ECHOCARDIOGRAM  11/08/2005   EF 68%, normal LV systolic function   UPPER GASTROINTESTINAL ENDOSCOPY  2010   Negative, Dr.[name]    There were no vitals filed for this visit.   Subjective Assessment - 07/26/21 1851     Subjective Had xrays completed of both of my hips and right knee last week as I was worried something was wrong when I had a hard time getting up and walking.  MD said xrays neg feels my Quads (pt pointing to his pelvic area) are weak.Claiborne Rigg backed off on some of the exercises I have been doing."    Currently in Pain? Yes    Pain Score 6     Pain  Descriptors / Indicators Discomfort    Pain Type Chronic pain                                            PT Long Term Goals - 07/20/21 2020       PT LONG TERM GOAL #1   Title I would like to be able to climb stairs using my Right leg    Baseline unable to press through Rt leg to climb up stairs at this time    Status On-going    Target Date 09/02/21      PT LONG TERM GOAL #2   Title Walk normal    Baseline significantly improved in form but limited by pain    Status Partially Met      PT LONG TERM GOAL #3   Title build up leg strength and strengthen the core    Baseline will cont to progress and establish long term HEP    Status On-going      PT LONG TERM GOAL #4   Title reduce Rt foot numbness    Baseline still gets numb in car- became numb from STM to peroneals today, maint of hip flexion in supine resulted in numbness    Status On-going                   Plan - 07/26/21 1854     Clinical Impression Statement Focus on glute strengthening today. Pt placed in prone with added buoyancy devices to limit LB discomfort in position.  He tolerates fair. Palpable glute activation with kicking, cuing for effort being placed with hip extension.  Added flippers to increase glute activation (didn't have a pair of flippers that are quite the right size).  Also added resisted hip extension in standing as well as kick backs using rubber water band. Pt reporting not feeling alot of glute contraction/fatigue upon completion. Pt instructed to work in hip extension when "flutter kicking" rather than hip flex (sitting on noodle rather put noodle under arms getting into vertical or prone position instead)    PT Treatment/Interventions ADLs/Self Care Home Management;Cryotherapy;Functional mobility training;Stair training;Gait training;Aquatic Therapy;Therapeutic activities;Therapeutic exercise;Balance training;Neuromuscular re-education;Patient/family  education;Passive range of motion;Scar mobilization;Manual techniques;Taping             Patient will benefit from skilled therapeutic intervention in order to improve the following deficits and impairments:  Abnormal gait, Impaired sensation, Decreased activity tolerance, Decreased strength, Pain, Increased muscle spasms, Difficulty walking,  Improper body mechanics, Decreased range of motion, Postural dysfunction  Visit Diagnosis: Pain in right hip  Pain in right foot  Difficulty in walking, not elsewhere classified  Muscle weakness (generalized)     Problem List Patient Active Problem List   Diagnosis Date Noted   Pneumonia due to COVID-19 virus 10/08/2020   Upper airway cough syndrome 10/08/2020   S/P right TKA 12/16/2019   Status post total knee replacement, right 12/16/2019   Hip fracture ([place]) 11/13/2018   Chronic anemia 11/06/2018   Reactive airway disease 11/06/2018   Femoral fracture ([place]) 11/05/2018   Morbid obesity ([place]) 12/20/2016   S/P left THA, AA 12/19/2016   Chest pain 08/15/2016   S/P right TH revision 02/09/2016   Hip instability 02/05/2016   H/O total hip arthroplasty 02/05/2016   S/P right THA, AA 02/01/2016   S/P knee replacement 11/27/2014   Unspecified viral hepatitis C without hepatic coma 08/28/2014   DYSPHONIA, CHRONIC 01/06/2010   NONSPECIFIC ABNORMAL ELECTROCARDIOGRAM 01/06/2010   HOARSENESS, CHRONIC 05/31/2009   Hypothyroidism 04/09/2009   Hyperlipidemia LDL goal <100 04/09/2009   HTN (hypertension) 04/09/2009   GERD (gastroesophageal reflux disease) 04/09/2009   Sleep apnea 04/09/2009   Pt seen for aquatic therapy today.  Treatment took place in water 3.25-4.8 ft in depth at the [facility] pool. Temp of water was 91.  Pt entered/exited the pool via stairs step to pattern independently with bilat rail.   Warm up Water walking forward, back and side stepping increasing step length and speed, cuing for UE placement to  increase resistance x 4 lengths of pool   Seated Gastroc, hamstring and adductor stretching 3 x 30 sec each   Standing Hip extension using water band 2 x 10 bilat Hip extension kick outs using band 2 x 10 reps Hip add/abd 2 x 10   Prone suspension Using noodles and kick board then adding flippers flutter kicking with cueing for concentration on hip extension, knee straight to activate glutes Holding to wall 2 x 12 kick backs with cuing for increased speed.  Vertical suspension Scissor kicking Add/abd   Pt requires buoyancy for support and to offload joints with strengthening exercises. Viscosity of the water is needed for resistance of strengthening; water current perturbations provides challenge to standing balance unsupported, requiring increased core activation.    Vedia Pereyra MPT 07/26/2021, 7:05 PM  [facility] [address] [place], [place], [address] Phone: [phone]   Fax:  [phone]  Name: Samuel Goodwin MRN: [id number] Date of Birth: [birth year]

## 2021-08-02 ENCOUNTER — Encounter (HOSPITAL_BASED_OUTPATIENT_CLINIC_OR_DEPARTMENT_OTHER): Payer: Self-pay | Admitting: Physical Therapy

## 2021-08-02 ENCOUNTER — Other Ambulatory Visit: Payer: Self-pay

## 2021-08-02 ENCOUNTER — Ambulatory Visit (HOSPITAL_BASED_OUTPATIENT_CLINIC_OR_DEPARTMENT_OTHER): Payer: No Typology Code available for payment source | Admitting: Physical Therapy

## 2021-08-02 DIAGNOSIS — M79671 Pain in right foot: Secondary | ICD-10-CM

## 2021-08-02 DIAGNOSIS — R262 Difficulty in walking, not elsewhere classified: Secondary | ICD-10-CM

## 2021-08-02 DIAGNOSIS — M6281 Muscle weakness (generalized): Secondary | ICD-10-CM

## 2021-08-02 DIAGNOSIS — M25551 Pain in right hip: Secondary | ICD-10-CM

## 2021-08-02 NOTE — Therapy (Signed)
[facility] [address] [place], [place], [address] Phone: [phone]   Fax:  [phone]  Physical Therapy Treatment  Patient Details  Name: Samuel Goodwin MRN: [id number] Date of Birth: 10-28-1961 Referring Provider (PT): Tawnya Crook, MD   Encounter Date: 08/02/2021   PT End of Session - 08/02/21 1814     Visit Number 8    Number of Visits 15    Date for PT Re-Evaluation 09/02/21    PT Start Time 1400    PT Stop Time 9678    PT Time Calculation (min) 49 min    Activity Tolerance Patient tolerated treatment well             Past Medical History:  Diagnosis Date   Alcohol abuse    Sobriety since 60   Anxiety    Arthritis    oa   Benign positional vertigo    Depression    Eardrum trauma    30 years ago/right ear   GERD (gastroesophageal reflux disease)    Headache    sinus   Hepatitis C 1994   hep c - ? 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   COLONOSCOPY  03/2013   HIP CLOSED REDUCTION Right 02/05/2016   Procedure: CLOSED MANIPULATION HIP;  Surgeon: Latanya Maudlin, MD;  [facility];  Service: Orthopedics;   Laterality: Right;   knee arthroscopic Bilateral    LARYNGOSCOPY  08/2009   Dr.[name]   NASAL FRACTURE SURGERY     age 60   right knee arthroscopic knee surgery  60 yrs ago   dr Theda Sers   THROAT SURGERY  aug, sept, Nov 16 2016   vocal cord  laser sugery X 3, Dr [name] , [facility]   TOTAL HIP ARTHROPLASTY Right 02/01/2016   Procedure: RIGHT TOTAL HIP ARTHROPLASTY ANTERIOR APPROACH;  Surgeon: Paralee Cancel, MD;  [facility];  Service: Orthopedics;  Laterality: Right;   TOTAL HIP ARTHROPLASTY Left 12/19/2016   Procedure: LEFT TOTAL HIP ARTHROPLASTY ANTERIOR APPROACH;  Surgeon: Paralee Cancel, MD;  [facility];  Service: Orthopedics;  Laterality: Left;   TOTAL HIP REVISION Right 11/11/2018   Procedure: right total hip arthroplasty revision, femoral stem;  Surgeon: Paralee Cancel, MD;  Location[facility];  Service: Orthopedics;  Laterality: Right;  58mn   TOTAL KNEE ARTHROPLASTY Left 11/27/2014   Procedure: LEFT TOTAL KNEE ARTHROPLASTY;  Surgeon: SAugustin Schooling MD;  Location: [facility]  Service: Orthopedics;  Laterality: Left;   TOTAL KNEE ARTHROPLASTY Right 12/16/2019   Procedure: TOTAL KNEE ARTHROPLASTY;  Surgeon: OParalee Cancel MD;  [facility];  Service: Orthopedics;  Laterality: Right;  70 mins   TRANSTHORACIC ECHOCARDIOGRAM  11/08/2005   EF 68%, normal LV systolic function   UPPER GASTROINTESTINAL ENDOSCOPY  2010   Negative, Dr.[name]    There were no vitals filed for this visit.   Subjective Assessment - 08/02/21 1414     Subjective Felt my hips in a differnet way affter using bands last session.  No pain just felt loike a good work out. Overall I  am doing well, better than before"                                            PT Long Term Goals - 07/20/21 2020       PT LONG TERM GOAL #1   Title I would like to be able to climb stairs using my Right leg    Baseline unable to press through Rt leg to climb up stairs at this time    Status On-going     Target Date 09/02/21      PT LONG TERM GOAL #2   Title Walk normal    Baseline significantly improved in form but limited by pain    Status Partially Met      PT LONG TERM GOAL #3   Title build up leg strength and strengthen the core    Baseline will cont to progress and establish long term HEP    Status On-going      PT LONG TERM GOAL #4   Title reduce Rt foot numbness    Baseline still gets numb in car- became numb from STM to peroneals today, maint of hip flexion in supine resulted in numbness    Status On-going            Pt seen for aquatic therapy today.  Treatment took place in water 3.25-4.8 ft in depth at the [facility] pool. Temp of water was 91.  Pt entered/exited the pool via stairs step to pattern independently with bilat rail.   Warm up Water walking forward, back and side stepping increasing step length and speed, cuing for UE placement to increase resistance x 4 lengths of pool   Seated Gastroc, hamstring and adductor stretching 3 x 30 sec each   Standing Hip extension using water band 2 x 10 bilat Hip extension kick outs using band 2 x 10 reps Hip add x 10, then hip flex x 10 resisted using water bands Hip flex stretching using noodle 3 x 30 seconds R/L cuing for proper positioning and execution.   Prone suspension Knees to chest then R/L 3-4 trials holding x 20 seconds for gentle stretch facilitated by therapist to gain and hold position.  Holding to wall 2 x 12 kick backs with cuing for increased speed.   Water walking x 6 lengths backward with vc for glute activation.  Pt requires buoyancy for support and to offload joints with strengthening exercises. Viscosity of the water is needed for resistance of strengthening; water current perturbations provides challenge to standing balance unsupported, requiring increased core activation.        Plan - 08/02/21 1519     Clinical Impression Statement Continued focus on glute and hamstring  strength using bands and backward step ups. VMO strengthening with partial step ups of water step submerged 90%. VC and TC on shifting weight with stair climbing. Attempted  to translate to when submerged at waist. Completes concentrating on VMO side stepping. Unable to tolerate or step up with lle, complets x 10 right.    PT Treatment/Interventions ADLs/Self Care Home Management;Cryotherapy;Functional mobility training;Stair training;Gait training;Aquatic Therapy;Therapeutic activities;Therapeutic exercise;Balance training;Neuromuscular re-education;Patient/family education;Passive range of motion;Scar mobilization;Manual techniques;Taping    PT Next Visit Plan [place] VMO and hamstring strengthening, stair climbing             Patient will benefit from skilled therapeutic intervention in order to improve the following deficits and impairments:  Abnormal gait, Impaired sensation, Decreased activity tolerance, Decreased strength, Pain, Increased muscle spasms, Difficulty walking, Improper body mechanics, Decreased range of motion, Postural dysfunction  Visit Diagnosis: Pain in right hip  Pain in right foot  Difficulty in walking, not elsewhere classified  Muscle weakness (generalized)     Problem List Patient Active Problem List   Diagnosis Date Noted   Pneumonia due to COVID-19 virus 10/08/2020   Upper airway cough syndrome 10/08/2020   S/P right TKA 12/16/2019   Status post total knee replacement, right 12/16/2019   Hip fracture ([place]) 11/13/2018   Chronic anemia 11/06/2018   Reactive airway disease 11/06/2018   Femoral fracture ([place]) 11/05/2018   Morbid obesity ([place]) 12/20/2016   S/P left THA, AA 12/19/2016   Chest pain 08/15/2016   S/P right TH revision 02/09/2016   Hip instability 02/05/2016   H/O total hip arthroplasty 02/05/2016   S/P right THA, AA 02/01/2016   S/P knee replacement 11/27/2014   Unspecified viral hepatitis C without hepatic coma 08/28/2014   DYSPHONIA,  CHRONIC 01/06/2010   NONSPECIFIC ABNORMAL ELECTROCARDIOGRAM 01/06/2010   HOARSENESS, CHRONIC 05/31/2009   Hypothyroidism 04/09/2009   Hyperlipidemia LDL goal <100 04/09/2009   HTN (hypertension) 04/09/2009   GERD (gastroesophageal reflux disease) 04/09/2009   Sleep apnea 04/09/2009    Vedia Pereyra MPT 08/02/2021, 6:17 PM  [facility] [address] [place], [place], [address] Phone: [phone]   Fax:  [phone]  Name: Samuel Goodwin MRN: [id number] Date of Birth: 07/28/1961

## 2021-08-03 LAB — LIPID PANEL
Chol/HDL Ratio: 4.8 ratio (ref 0.0–5.0)
Cholesterol, Total: 207 mg/dL — ABNORMAL HIGH (ref 100–199)
HDL: 43 mg/dL
LDL Chol Calc (NIH): 143 mg/dL — ABNORMAL HIGH (ref 0–99)
Triglycerides: 117 mg/dL (ref 0–149)
VLDL Cholesterol Cal: 21 mg/dL (ref 5–40)

## 2021-08-04 ENCOUNTER — Ambulatory Visit (HOSPITAL_BASED_OUTPATIENT_CLINIC_OR_DEPARTMENT_OTHER): Payer: No Typology Code available for payment source | Admitting: Physical Therapy

## 2021-08-05 ENCOUNTER — Encounter: Payer: Self-pay | Admitting: Cardiovascular Disease

## 2021-08-05 ENCOUNTER — Ambulatory Visit (INDEPENDENT_AMBULATORY_CARE_PROVIDER_SITE_OTHER): Payer: No Typology Code available for payment source | Admitting: Cardiovascular Disease

## 2021-08-05 ENCOUNTER — Other Ambulatory Visit: Payer: Self-pay

## 2021-08-05 VITALS — BP 142/86 | HR 61 | Ht 72.0 in | Wt 292.8 lb

## 2021-08-05 DIAGNOSIS — I10 Essential (primary) hypertension: Secondary | ICD-10-CM

## 2021-08-05 DIAGNOSIS — G4733 Obstructive sleep apnea (adult) (pediatric): Secondary | ICD-10-CM | POA: Diagnosis not present

## 2021-08-05 DIAGNOSIS — E785 Hyperlipidemia, unspecified: Secondary | ICD-10-CM

## 2021-08-05 NOTE — Assessment & Plan Note (Signed)
History of essential hypertension blood pressure measured today 142/86.  He is on hydrochlorothiazide, and losartan.

## 2021-08-05 NOTE — Assessment & Plan Note (Signed)
History of hyperlipidemia intolerant to statin therapy on acetamide with lipid profile performed 08/03/2021 revealing a total cholesterol of 207, LDL 143 and HDL 43.  I am going to refer him to Dr. Debara Pickett to discuss initiation of PCSK9.  In addition, I am going to get a coronary calcium score to help assist in determining aggressiveness and lipid treatment.

## 2021-08-05 NOTE — Progress Notes (Signed)
08/05/2021 [place] Buy[place]   08/06/1961  YC:[id number]  Primary Physician [name], MD Primary Cardiologist: Lorretta Harp MD [name], [place], [place]  HPI:  Samuel Goodwin is a 60 y.o.  moderately overweight, married Caucasian male, father of 3 who I last saw   01/14/2021.  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  Since I saw him last he has changed jobs and currently is working only in [place].  He has a strong family history for heart disease along with hypertension and hyperlipidemia. He has obstructive sleep apnea on CPAP which he benefits from.[name] He is trying to lose weight with marginal success. 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 He also has had throat cancer surgically addressed at [facility]. He was complaining of atypical chest pain  in the past and  had a negative Myoview stress test performed 08/29/2016.   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  He was unable to exercise unfortunately has gained weight as a result of that.  He also needs a right total knee replacement scheduled to be performed after the first the year by Dr. Alvan Dame .  He did recently take a statin holiday at the recommendation of his PCP which resulted in marked improvement in his back and thigh symptoms.   Since I saw him in the office 6 months ago he continues to do well.  He is trying to lose weight.  He does not water aerobics.  He is not using statin therapy because of "statin intolerance.  He denies chest pain or shortness of breath.   Current Meds  Medication Sig   Ascorbic Acid (VITAMIN C) 1000 MG tablet Take 1,000 mg by mouth daily.    b complex vitamins tablet Take 2 tablets by mouth daily.    celecoxib (CELEBREX) 200 MG capsule Take 1 capsule (200 mg total) by mouth 2 (two) times  daily.   fluticasone (FLONASE) 50 MCG/ACT nasal spray Place 1 spray into both nostrils daily as needed for allergies.    hydrochlorothiazide (HYDRODIURIL) 25 MG tablet Take 1 tablet (25 mg total) by mouth daily.   levothyroxine (SYNTHROID, LEVOTHROID) 125 MCG tablet Take 250 mcg by mouth daily before breakfast.   losartan (COZAAR) 50 MG tablet Take 50 mg by mouth daily.   Multiple Vitamin (MULTIVITAMIN) tablet Take 1 tablet by mouth every morning.   pantoprazole (PROTONIX) 40 MG tablet TAKE 1 TABLET BY MOUTH 30-60 MINUTES BEFORE FIRST AND LAST MEALS OF THE DAY     Allergies  Allergen Reactions   Dilaudid [Hydromorphone Hcl] Nausea And Vomiting    "Questionable as to whether it was actually dilaudid  That made me so severely nauseous and vomit"   Lipitor [Atorvastatin]     myalgia   Pitavastatin     Myalgia    Simvastatin     myalgia   Benazepril Hcl Cough    Social History   Socioeconomic History   Marital status: Married    Spouse name: Not on file   Number of children: Not on file   Years of education: Not on file   Highest education level: Not on file  Occupational History   Not on file  Tobacco Use   Smoking status: Never   Smokeless tobacco: Never  Tobacco comments:    Quit at age 89  Vaping Use   Vaping Use: Never used  Substance and Sexual Activity   Alcohol use: No    Comment: alcoholism quit 30 years ago    Drug use: No   Sexual activity: Yes  Other Topics Concern   Not on file  Social History Narrative   Not on file   Social Determinants of Health   Financial Resource Strain: Not on file  Food Insecurity: Not on file  Transportation Needs: Not on file  Physical Activity: Not on file  Stress: Not on file  Social Connections: Not on file  Intimate Partner Violence: Not on file     Review of Systems: General: negative for chills, fever, night sweats or weight changes.  Cardiovascular: negative for chest pain, dyspnea on exertion, edema,  orthopnea, palpitations, paroxysmal nocturnal dyspnea or shortness of breath Dermatological: negative for rash Respiratory: negative for cough or wheezing Urologic: negative for hematuria Abdominal: negative for nausea, vomiting, diarrhea, bright red blood per rectum, melena, or hematemesis Neurologic: negative for visual changes, syncope, or dizziness All other systems reviewed and are otherwise negative except as noted above.    Blood pressure (!) 142/86, pulse 61, height 6' (1.829 m), weight 292 lb 12.8 oz (132.8 kg), SpO2 97 %.  General appearance: alert and no distress Neck: no adenopathy, no carotid bruit, no JVD, supple, symmetrical, trachea midline, and thyroid not enlarged, symmetric, no tenderness/mass/nodules Lungs: clear to auscultation bilaterally Heart: regular rate and rhythm, S1, S2 normal, no murmur, click, rub or gallop Extremities: extremities normal, atraumatic, no cyanosis or edema Pulses: 2+ and symmetric Skin: Skin color, texture, turgor normal. No rashes or lesions Neurologic: Grossly normal  EKG sinus rhythm at 61 without ST or T wave changes.  I personally reviewed this EKG.  ASSESSMENT AND PLAN:   Hyperlipidemia LDL goal <100 History of hyperlipidemia intolerant to statin therapy on acetamide with lipid profile performed 08/03/2021 revealing a total cholesterol of 207, LDL 143 and HDL 43.  I am going to refer him to Dr. Debara Pickett to discuss initiation of PCSK9.  In addition, I am going to get a coronary calcium score to help assist in determining aggressiveness and lipid treatment.  HTN (hypertension) History of essential hypertension blood pressure measured today 142/86.  He is on hydrochlorothiazide, and losartan.  Sleep apnea History of obstructive sleep apnea on CPAP which he wears religiously.  Morbid obesity ([place]) History of morbid obesity attempting to lose weight.  He does do water aerobics and watches his portion size.  I am referring him to Dr[facility]  [facility]afy [facility] for further evaluation and assistance in weight loss.     Lorretta Harp MD FACP,FACC,FAHA, [facility] 08/05/2021 9:12 AM

## 2021-08-05 NOTE — Assessment & Plan Note (Signed)
History of obstructive sleep apnea on CPAP which he wears religiously.

## 2021-08-05 NOTE — Patient Instructions (Signed)
Medication Instructions:  The current medical regimen is effective;  continue present plan and medications.  *If you need a refill on your cardiac medications before your next appointment, please call your pharmacy*  Testing/Procedures: CALCIUM SCORE   Follow-Up: At [facility], you and your health needs are our priority.  As part of our continuing mission to provide you with exceptional heart care, we have created designated Provider Care Teams.  These Care Teams include your primary Cardiologist (physician) and Advanced Practice Providers (APPs -  Physician Assistants and Nurse Practitioners) who all work together to provide you with the care you need, when you need it.  We recommend signing up for the patient portal called "MyChart".  Sign up information is provided on this After Visit Summary.  MyChart is used to connect with patients for Virtual Visits (Telemedicine).  Patients are able to view lab/test results, encounter notes, upcoming appointments, etc.  Non-urgent messages can be sent to your provider as well.   To learn more about what you can do with MyChart, go to [email or web address].    Your next appointment:   6 month(s)  The format for your next appointment:   In Person  Provider:   You will see one of the following Advanced Practice Providers on your designated Care Team:   Sande Rives, PA-C Coletta Memos, FNP  Then, Quay Burow, MD will plan to see you again in 12 month(s).   Other Instructions  Referral over to Dr.Hilty- we will call you to set up an appointment. Referral over to [facility]- they will call you to set up an appointment.

## 2021-08-05 NOTE — Assessment & Plan Note (Signed)
History of morbid obesity attempting to lose weight.  He does do water aerobics and watches his portion size.  I am [facility] him [facility] for [facility] and assistance in weight loss.

## 2021-08-09 ENCOUNTER — Encounter (HOSPITAL_BASED_OUTPATIENT_CLINIC_OR_DEPARTMENT_OTHER): Payer: Self-pay | Admitting: Physical Therapy

## 2021-08-09 ENCOUNTER — Ambulatory Visit (HOSPITAL_BASED_OUTPATIENT_CLINIC_OR_DEPARTMENT_OTHER): Payer: No Typology Code available for payment source | Admitting: Physical Therapy

## 2021-08-09 ENCOUNTER — Other Ambulatory Visit: Payer: Self-pay

## 2021-08-09 DIAGNOSIS — M79671 Pain in right foot: Secondary | ICD-10-CM

## 2021-08-09 DIAGNOSIS — R262 Difficulty in walking, not elsewhere classified: Secondary | ICD-10-CM

## 2021-08-09 DIAGNOSIS — M6281 Muscle weakness (generalized): Secondary | ICD-10-CM

## 2021-08-09 DIAGNOSIS — M25551 Pain in right hip: Secondary | ICD-10-CM

## 2021-08-09 NOTE — Therapy (Signed)
[place], [place], [address] Phone: [phone]   Fax:  [phone]  Physical Therapy Treatment  Patient Details  Name: Samuel Goodwin MRN: [id number] Date of Birth: Dec 15, 1960 Referring Provider (PT): Tawnya Crook, MD   Encounter Date: 08/09/2021   PT End of Session - 08/09/21 1620     Visit Number 9    Number of Visits 15    Date for PT Re-Evaluation 09/02/21    PT Start Time 8916    PT Stop Time 9450    PT Time Calculation (min) 45 min    Equipment Utilized During Treatment Other (comment)             Past Medical History:  Diagnosis Date   Alcohol abuse    Sobriety since 1990   Anxiety    Arthritis    oa   Benign positional vertigo    Depression    Eardrum trauma    30 years ago/right ear   GERD (gastroesophageal reflux disease)    Headache    sinus   Hepatitis C 1994   hep c - ? 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   COLONOSCOPY  03/2013   HIP CLOSED REDUCTION Right 02/05/2016   Procedure: CLOSED MANIPULATION HIP;  Surgeon: Latanya Maudlin, MD;  Location: [facility];  Service: Orthopedics;   Laterality: Right;   knee arthroscopic Bilateral    LARYNGOSCOPY  08/2009   Dr.[name]   NASAL FRACTURE SURGERY     age 60   right knee arthroscopic knee surgery  12 yrs ago   dr Theda Sers   THROAT SURGERY  aug, sept, Nov 16 2016   vocal cord  laser sugery X 3, Dr [name] , [facility]   TOTAL HIP ARTHROPLASTY Right 02/01/2016   Procedure: RIGHT TOTAL HIP ARTHROPLASTY ANTERIOR APPROACH;  Surgeon: Paralee Cancel, MD;  [facility];  Service: Orthopedics;  Laterality: Right;   TOTAL HIP ARTHROPLASTY Left 12/19/2016   Procedure: LEFT TOTAL HIP ARTHROPLASTY ANTERIOR APPROACH;  Surgeon: Paralee Cancel, MD;  [facility];  Service: Orthopedics;  Laterality: Left;   TOTAL HIP REVISION Right 11/11/2018   Procedure: right total hip arthroplasty revision, femoral stem;  Surgeon: Paralee Cancel, MD;  [facility]: [facility];  Service: Orthopedics;  Laterality: Right;  44mn   TOTAL KNEE ARTHROPLASTY Left 11/27/2014   Procedure: LEFT TOTAL KNEE ARTHROPLASTY;  Surgeon: SAugustin Schooling MD;  [place] [facility]  Service: Orthopedics;  Laterality: Left;   TOTAL KNEE ARTHROPLASTY Right 12/16/2019   Procedure: TOTAL KNEE ARTHROPLASTY;  Surgeon: OParalee Cancel MD;  [facility];  Service: Orthopedics;  Laterality: Right;  70 mins   TRANSTHORACIC ECHOCARDIOGRAM  11/08/2005   EF 68%, normal LV systolic function   UPPER GASTROINTESTINAL ENDOSCOPY  2010   Negative, Dr.[name]    There were no vitals filed for this visit.   Subjective Assessment - 08/09/21 1534     Subjective "Felt really good after my last session was able to walk really well than after 1 day my hip flet real tight again"    Currently in Pain? Yes    Pain Score 4     Pain Location Knee    Pain Orientation Right;Lateral    Pain Descriptors / Indicators Discomfort    Pain Type Chronic pain    Pain Radiating Towards right hip    Pain Onset More than a month ago    Pain Frequency Intermittent                                             PT Long Term Goals - 07/20/21 2020       PT LONG TERM GOAL #1   Title I would like to be able to climb stairs using my Right leg    Baseline unable to press through Rt leg to climb up stairs at this time    Status On-going    Target Date 09/02/21      PT LONG TERM GOAL #2   Title Walk normal    Baseline significantly improved in form but limited by pain    Status Partially Met      PT LONG TERM GOAL #3   Title build up leg strength and strengthen the core    Baseline will cont to progress and establish long term HEP    Status On-going      PT LONG TERM GOAL #4   Title reduce Rt foot numbness    Baseline still gets numb in car- became numb from STM to peroneals today, maint of hip flexion in supine resulted in numbness    Status On-going           Pt seen for aquatic therapy today.  Treatment took place in water 3.25-4.8 ft in depth at the [facility] pool. Temp of water was 91.  Pt entered/exited the pool via stairs step to pattern independently with bilat rail.   Warm up Water walking forward, back and side stepping increasing step length and speed, cuing for UE placement to increase resistance x 4 lengths of pool   Seated Gastroc, hamstring and adductor stretching 3 x 30 sec each   Standing Stretching; hip flex; add; IT band and hip extensors  3 x 20 secs  Hip extension using water band 2 x 10 bilat Hip extension kick outs using band 2 x 10 reps Hip add x 10; abductors x 10 then hip flex x 10  All resisted using water band Hip flex stretching using noodle 3 x 30 seconds R/L cuing for proper positioning and execution.   Prone suspension Knees to chest then R/L 3-4 trials holding x 20 seconds for gentle stretch facilitated by therapist to gain and hold position. Jack knife x 5     Water walking x 6 lengths backward with vc for glute activation.   Pt requires buoyancy for support and to  offload joints with strengthening exercises. Viscosity of the water is needed  for resistance of strengthening; water current perturbations provides challenge to standing balance unsupported, requiring increased core activation.         Plan - 08/09/21 1538     Clinical Impression Statement Pt tolerating session well.  He is compliant with HEP/coming to pool toexercises at least 4 days a week. Continues to c/o hip tightness groin/throughout circumference.  Focus on thorough stretching of hips f/b strengthening with continued concentration on glutes.    Examination-Activity Limitations Bed Mobility;Sit;Bend;Squat;Sleep;Stairs;Stand;Transfers;Locomotion Level    PT Treatment/Interventions ADLs/Self Care Home Management;Cryotherapy;Functional mobility training;Stair training;Gait training;Aquatic Therapy;Therapeutic activities;Therapeutic exercise;Balance training;Neuromuscular re-education;Patient/family education;Passive range of motion;Scar mobilization;Manual techniques;Taping    PT Home Exercise Plan Bicycling usinging squoodle and hand buoys for suspension completing intervals 20 secs on, 40 sec off x 5 mins, Hip flex stretch and strengthening using noodle, holding to wall, pulling knee towards wall, add/abd using ankle buoys with tight core 2 x 10 reps. Hip flex/extension, add/abd and hip extension 2 x 10 rep             Patient will benefit from skilled therapeutic intervention in order to improve the following deficits and impairments:  Abnormal gait, Impaired sensation, Decreased activity tolerance, Decreased strength, Pain, Increased muscle spasms, Difficulty walking, Improper body mechanics, Decreased range of motion, Postural dysfunction  Visit Diagnosis: Pain in right hip  Pain in right foot  Difficulty in walking, not elsewhere classified  Muscle weakness (generalized)     Problem List Patient Active Problem List   Diagnosis Date Noted   Pneumonia due to COVID-19 virus  10/08/2020   Upper airway cough syndrome 10/08/2020   S/P right TKA 12/16/2019   Status post total knee replacement, right 12/16/2019   Hip fracture ([place]) 11/13/2018   Chronic anemia 11/06/2018   Reactive airway disease 11/06/2018   Femoral fracture ([place]) 11/05/2018   Morbid obesity ([place]) 12/20/2016   S/P left THA, AA 12/19/2016   Chest pain 08/15/2016   S/P right TH revision 02/09/2016   Hip instability 02/05/2016   H/O total hip arthroplasty 02/05/2016   S/P right THA, AA 02/01/2016   S/P knee replacement 11/27/2014   Unspecified viral hepatitis C without hepatic coma 08/28/2014   DYSPHONIA, CHRONIC 01/06/2010   NONSPECIFIC ABNORMAL ELECTROCARDIOGRAM 01/06/2010   HOARSENESS, CHRONIC 05/31/2009   Hypothyroidism 04/09/2009   Hyperlipidemia LDL goal <100 04/09/2009   HTN (hypertension) 04/09/2009   GERD (gastroesophageal reflux disease) 04/09/2009   Sleep apnea 04/09/2009    Vedia Pereyra MPT 08/09/2021, 6:12 PM  [facility] [address] [place], [place], [address] Phone: [phone]   Fax:  [phone]  Name: Samuel Goodwin MRN: [id number] Date of Birth: May 06, 1961

## 2021-08-10 NOTE — Telephone Encounter (Signed)
PA FOR REPATHA SUBMITTED TO [employer]  Tyr [place] (Key: [id number])

## 2021-08-11 ENCOUNTER — Telehealth: Payer: Self-pay

## 2021-08-11 DIAGNOSIS — E785 Hyperlipidemia, unspecified: Secondary | ICD-10-CM

## 2021-08-11 MED ORDER — REPATHA SURECLICK 140 MG/ML ~~LOC~~ SOAJ: 140.0000 mg | SUBCUTANEOUS | 10 refills | Status: DC

## 2021-08-11 NOTE — Telephone Encounter (Signed)
Called and spoke w/pt and stated that they were approved for repatha, rx sent, copay card provided, pt instructed to complete fasting lipids post 4th dose. Pt voiced understanding.  Copay card for repatha: RxBin: [id number] [place]: CN RxGrp: [id number] ID: [id number]

## 2021-08-16 ENCOUNTER — Ambulatory Visit (HOSPITAL_BASED_OUTPATIENT_CLINIC_OR_DEPARTMENT_OTHER): Payer: No Typology Code available for payment source | Attending: Family Medicine | Admitting: Physical Therapy

## 2021-08-16 ENCOUNTER — Encounter (HOSPITAL_BASED_OUTPATIENT_CLINIC_OR_DEPARTMENT_OTHER): Payer: Self-pay | Admitting: Physical Therapy

## 2021-08-16 ENCOUNTER — Other Ambulatory Visit: Payer: Self-pay

## 2021-08-16 DIAGNOSIS — R262 Difficulty in walking, not elsewhere classified: Secondary | ICD-10-CM

## 2021-08-16 DIAGNOSIS — M6281 Muscle weakness (generalized): Secondary | ICD-10-CM | POA: Diagnosis present

## 2021-08-16 DIAGNOSIS — M25551 Pain in right hip: Secondary | ICD-10-CM

## 2021-08-16 DIAGNOSIS — M79671 Pain in right foot: Secondary | ICD-10-CM | POA: Diagnosis present

## 2021-08-16 NOTE — Therapy (Signed)
[facility] [address] [place], [place], [address] Phone: [phone]   Fax:  [phone]  Physical Therapy Treatment  Patient Details  Name: Samuel Goodwin MRN: [id number] Date of Birth: February 23, 1961 Referring Provider (PT): Tawnya Crook, MD   Encounter Date: 08/16/2021   PT End of Session - 08/16/21 1913     Visit Number 11    Number of Visits 15    Date for PT Re-Evaluation 09/02/21             Past Medical History:  Diagnosis Date   Alcohol abuse    Sobriety since 1990   Anxiety    Arthritis    oa   Benign positional vertigo    Depression    Eardrum trauma    30 years ago/right ear   GERD (gastroesophageal reflux disease)    Headache    sinus   Hepatitis C 1994   hep c - ? 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   COLONOSCOPY  03/2013   HIP CLOSED REDUCTION Right 02/05/2016   Procedure: CLOSED MANIPULATION HIP;  Surgeon: Latanya Maudlin, MD;  [facility];  Service: Orthopedics;  Laterality: Right;   knee arthroscopic Bilateral    LARYNGOSCOPY  08/2009   Dr.[name]   NASAL FRACTURE SURGERY     age 60   right knee  arthroscopic knee surgery  12 yrs ago   dr Theda Sers   THROAT SURGERY  aug, sept, Nov 16 2016   vocal cord  laser sugery X 3, Dr [name] , [facility]   TOTAL HIP ARTHROPLASTY Right 02/01/2016   Procedure: RIGHT TOTAL HIP ARTHROPLASTY ANTERIOR APPROACH;  Surgeon: Paralee Cancel, MD;  [facility];  Service: Orthopedics;  Laterality: Right;   TOTAL HIP ARTHROPLASTY Left 12/19/2016   Procedure: LEFT TOTAL HIP ARTHROPLASTY ANTERIOR APPROACH;  Surgeon: Paralee Cancel, MD;  [facility];  Service: Orthopedics;  Laterality: Left;   TOTAL HIP REVISION Right 11/11/2018   Procedure: right total hip arthroplasty revision, femoral stem;  Surgeon: Paralee Cancel, MD;  [facility]: [facility];  Service: Orthopedics;  Laterality: Right;  37mn   TOTAL KNEE ARTHROPLASTY Left 11/27/2014   Procedure: LEFT TOTAL KNEE ARTHROPLASTY;  Surgeon: SAugustin Schooling MD;  [facility]  Service: Orthopedics;  Laterality: Left;   TOTAL KNEE ARTHROPLASTY Right 12/16/2019   Procedure: TOTAL KNEE ARTHROPLASTY;  Surgeon: OParalee Cancel MD;  [facility];  Service: Orthopedics;  Laterality: Right;  70 mins   TRANSTHORACIC ECHOCARDIOGRAM  11/08/2005   EF 68%, normal LV systolic function   UPPER GASTROINTESTINAL ENDOSCOPY  2010  Negative, Dr.[name]    There were no vitals filed for this visit.   Subjective Assessment - 08/16/21 1911     Subjective "I am keeping upo with all that you are telling me to do daily"    Currently in Pain? Yes    Pain Score 4     Pain Location Knee    Pain Orientation Right;Lateral    Pain Descriptors / Indicators Discomfort    Pain Type Chronic pain    Pain Onset More than a month ago    Pain Frequency Intermittent                                       PT Education - 08/16/21 1912     Education Details "Importance of allowing muscle to recover/rest"    Person(s) Educated Patient    Methods Explanation    Comprehension Verbalized understanding                  PT Long Term Goals - 07/20/21 2020       PT LONG TERM GOAL #1   Title I would like to be able to climb stairs using my Right leg    Baseline unable to press through Rt leg to climb up stairs at this time    Status On-going    Target Date 09/02/21      PT LONG TERM GOAL #2   Title Walk normal    Baseline significantly improved in form but limited by pain    Status Partially Met      PT LONG TERM GOAL #3   Title build up leg strength and strengthen the core    Baseline will cont to progress and establish long term HEP    Status On-going      PT LONG TERM GOAL #4   Title reduce Rt foot numbness    Baseline still gets numb in car- became numb from STM to peroneals today, maint of hip flexion in supine resulted in numbness    Status On-going            Pt seen for aquatic therapy today.  Treatment took place in water 3.25-4.8 ft in depth at the [facility] pool. Temp of water was 91.  Pt entered/exited the pool via stairs step to pattern independently with bilat rail.   Warm up Water walking forward, back and side stepping increasing step length and speed, cuing for UE placement to increase resistance x 4 lengths of pool   Seated Gastroc, hamstring and adductor stretching 3 x 30 sec each   Standing Stretching; hip flex; add using noodle; IT band and hip extensors  3 x 20 secs  Hip circles using blue noodle clockwise/counter 2 x10 L/R Pt reports L>R hamstring stretch  Hip extension using water band 2 x 10 bilat Hip extension kick outs using band 2 x 10 reps Hip add x 10; abductors x 10 then hip flex x 10  All resisted using water band Hip flex stretching using noodle 3 x 30 seconds R/L cuing for proper positioning and execution.   Prone suspension Knees to chest then R/L 3-4 trials holding x 20 seconds for gentle stretch facilitated by therapist to gain and hold position. Jack knife x 5     Water walking x 8 lengths backward with vc for glute  activation.   Pt requires buoyancy for support and  to offload joints with strengthening exercises. Viscosity of the water is needed for resistance of strengthening; water current perturbations provides challenge to standing balance unsupported, requiring increased core activation.       Plan - 08/16/21 1915     Clinical Impression Statement Compliant with all HEP, following all instructions given.  Continue to focus on glute strength which is improving. Gait less guarded. Pt with high level of understanding of deficeits.    PT Treatment/Interventions ADLs/Self Care Home Management;Cryotherapy;Functional mobility training;Stair training;Gait training;Aquatic Therapy;Therapeutic activities;Therapeutic exercise;Balance training;Neuromuscular re-education;Patient/family education;Passive range of motion;Scar mobilization;Manual techniques;Taping    PT Next Visit Plan Advance VMO and hamstring strengthening, stair climbing             Patient will benefit from skilled therapeutic intervention in order to improve the following deficits and impairments:  Abnormal gait, Impaired sensation, Decreased activity tolerance, Decreased strength, Pain, Increased muscle spasms, Difficulty walking, Improper body mechanics, Decreased range of motion, Postural dysfunction  Visit Diagnosis: Pain in right hip  Difficulty in walking, not elsewhere classified  Muscle weakness (generalized)     Problem List Patient Active Problem List   Diagnosis Date Noted   Pneumonia due to COVID-19 virus 10/08/2020   Upper airway cough syndrome 10/08/2020   S/P right TKA 12/16/2019   Status post total knee replacement, right 12/16/2019   Hip fracture ([place]) 11/13/2018   Chronic anemia 11/06/2018   Reactive airway disease 11/06/2018   Femoral fracture ([place]) 11/05/2018   Morbid obesity ([place]) 12/20/2016   S/P left THA, AA 12/19/2016   Chest pain 08/15/2016   S/P right TH revision 02/09/2016   Hip instability  02/05/2016   H/O total hip arthroplasty 02/05/2016   S/P right THA, AA 02/01/2016   S/P knee replacement 11/27/2014   Unspecified viral hepatitis C without hepatic coma 08/28/2014   DYSPHONIA, CHRONIC 01/06/2010   NONSPECIFIC ABNORMAL ELECTROCARDIOGRAM 01/06/2010   HOARSENESS, CHRONIC 05/31/2009   Hypothyroidism 04/09/2009   Hyperlipidemia LDL goal <100 04/09/2009   HTN (hypertension) 04/09/2009   GERD (gastroesophageal reflux disease) 04/09/2009   Sleep apnea 04/09/2009    Vedia Pereyra MPT 08/16/2021, 7:18 PM  [facility] [address] [place], [place], [address] Phone: [phone]   Fax:  [phone]  Name: Andrus Sharp MRN: [id number] Date of Birth: [birth year]

## 2021-08-18 ENCOUNTER — Ambulatory Visit (HOSPITAL_BASED_OUTPATIENT_CLINIC_OR_DEPARTMENT_OTHER): Payer: No Typology Code available for payment source | Admitting: Physical Therapy

## 2021-08-23 ENCOUNTER — Ambulatory Visit (HOSPITAL_BASED_OUTPATIENT_CLINIC_OR_DEPARTMENT_OTHER): Payer: No Typology Code available for payment source | Admitting: Physical Therapy

## 2021-08-25 ENCOUNTER — Ambulatory Visit (HOSPITAL_BASED_OUTPATIENT_CLINIC_OR_DEPARTMENT_OTHER): Payer: No Typology Code available for payment source | Admitting: Physical Therapy

## 2021-08-30 ENCOUNTER — Other Ambulatory Visit: Payer: Self-pay

## 2021-08-30 ENCOUNTER — Encounter (HOSPITAL_BASED_OUTPATIENT_CLINIC_OR_DEPARTMENT_OTHER): Payer: Self-pay | Admitting: Physical Therapy

## 2021-08-30 ENCOUNTER — Ambulatory Visit (HOSPITAL_BASED_OUTPATIENT_CLINIC_OR_DEPARTMENT_OTHER): Payer: No Typology Code available for payment source | Admitting: Physical Therapy

## 2021-08-30 DIAGNOSIS — M79671 Pain in right foot: Secondary | ICD-10-CM

## 2021-08-30 DIAGNOSIS — M25551 Pain in right hip: Secondary | ICD-10-CM

## 2021-08-30 DIAGNOSIS — R262 Difficulty in walking, not elsewhere classified: Secondary | ICD-10-CM

## 2021-08-30 DIAGNOSIS — M6281 Muscle weakness (generalized): Secondary | ICD-10-CM

## 2021-08-30 NOTE — Therapy (Signed)
[place], [place], [address] Phone: [phone]   Fax:  [phone]  Physical Therapy Treatment  Patient Details  Name: Samuel Goodwin MRN: [id number] Date of Birth: 04-11-1961 Referring Provider (PT): Tawnya Crook, MD   Encounter Date: 08/30/2021   PT End of Session - 08/30/21 1404     Visit Number 12    Number of Visits 15    Date for PT Re-Evaluation 09/02/21    PT Start Time 1400    PT Stop Time 1450    PT Time Calculation (min) 50 min    Equipment Utilized During Treatment Other (comment)    Activity Tolerance Patient tolerated treatment well    Behavior During Therapy WFL for tasks assessed/performed             Past Medical History:  Diagnosis Date   Alcohol abuse    Sobriety since 1990   Anxiety    Arthritis    oa   Benign positional vertigo    Depression    Eardrum trauma    30 years ago/right ear   GERD (gastroesophageal reflux disease)    Headache    sinus   Hepatitis C 1994   hep c - ? 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   COLONOSCOPY  03/2013   HIP CLOSED REDUCTION Right 02/05/2016    Procedure: CLOSED MANIPULATION HIP;  Surgeon: Latanya Maudlin, MD;  [facility]: [facility];  Service: Orthopedics;  Laterality: Right;   knee arthroscopic Bilateral    LARYNGOSCOPY  08/2009   Dr.[name]   NASAL FRACTURE SURGERY     age 60   right knee arthroscopic knee surgery  12 yrs ago   dr Theda Sers   THROAT SURGERY  aug, sept, Nov 16 2016   vocal cord  laser sugery X 3, Dr [name] , [facility]   TOTAL HIP ARTHROPLASTY Right 02/01/2016   Procedure: RIGHT TOTAL HIP ARTHROPLASTY ANTERIOR APPROACH;  Surgeon: Paralee Cancel, MD;  [facility];  Service: Orthopedics;  Laterality: Right;   TOTAL HIP ARTHROPLASTY Left 12/19/2016   Procedure: LEFT TOTAL HIP ARTHROPLASTY ANTERIOR APPROACH;  Surgeon: Paralee Cancel, MD;  [facility];  Service: Orthopedics;  Laterality: Left;   TOTAL HIP REVISION Right 11/11/2018   Procedure: right total hip arthroplasty revision, femoral stem;  Surgeon: Paralee Cancel, MD;  [facility]: [facility];  Service: Orthopedics;  Laterality: Right;  46mn   TOTAL KNEE ARTHROPLASTY Left 11/27/2014   Procedure: LEFT TOTAL KNEE ARTHROPLASTY;  Surgeon: SAugustin Schooling MD;  [place] [facility] [facility]  Service: Orthopedics;  Laterality: Left;  TOTAL KNEE ARTHROPLASTY Right 12/16/2019   Procedure: TOTAL KNEE ARTHROPLASTY;  Surgeon: Paralee Cancel, MD;  [facility];  Service: Orthopedics;  Laterality: Right;  70 mins   TRANSTHORACIC ECHOCARDIOGRAM  11/08/2005   EF 68%, normal LV systolic function   UPPER GASTROINTESTINAL ENDOSCOPY  2010   Negative, Dr.[name]    There were no vitals filed for this visit.   Subjective Assessment - 08/30/21 1456     Subjective 'Had a NCV test done at the end of last week and my leg and foot bothered me for a couple of days afterward, The low back stretches yuo showed me at the ladder work really well,I do them each time I come to the pool on my own"    Currently in Pain? Yes    Pain Score 5     Pain Orientation Right;Lateral    Pain Descriptors / Indicators  Aching;Shooting;Tightness    Pain Type Chronic pain    Pain Onset More than a month ago    Pain Frequency Intermittent    Aggravating Factors  walking distances    Pain Relieving Factors rest, OTC meds                                        PT Education - 08/30/21 1459     Education Details NCV testing    Person(s) Educated Patient    Methods Explanation    Comprehension Verbalized understanding                 PT Long Term Goals - 07/20/21 2020       PT LONG TERM GOAL #1   Title I would like to be able to climb stairs using my Right leg    Baseline unable to press through Rt leg to climb up stairs at this time    Status On-going    Target Date 09/02/21      PT LONG TERM GOAL #2   Title Walk normal    Baseline significantly improved in form but limited by pain    Status Partially Met      PT LONG TERM GOAL #3   Title build up leg strength and strengthen the core    Baseline will cont to progress and establish long term HEP    Status On-going      PT LONG TERM GOAL #4   Title reduce Rt foot numbness    Baseline still gets numb in car- became numb from STM to peroneals today, maint of hip flexion in supine resulted in numbness    Status On-going               Pt seen for aquatic therapy today.  Treatment took place in water 3.25-4.8 ft in depth at the [employer] pool. Temp of water was 91.  Pt entered/exited the pool via stairs step to pattern independently with bilat rail.   Warm up Water walking forward, back and side stepping increasing step length and speed, cuing for UE placement to increase resistance x 4 lengths of pool   Seated Gastroc, hamstring and adductor stretching 3 x 30 sec each   Prone suspension Knees to chest then R/L 3-4 trials holding x 20 seconds for gentle LB and core rotator R/L stretch facilitated by therapist to gain and hold position. Jack knife x 5   Suspended Completed in lap pool  needed  for the added depth. Core strengthening: -Prone to Supine Transition with Foam Dumbells and Ankle Floats: broken into segments:      -using 2 foam hand buoys standing to sup<>standing x 5 holding position x 10 seconds      -Knees to chest x 10 holding above position      -Standing<>prone x 5 holding position x 10 seconds      -prone to supine transition x 5 Pt requiring vc, tc and demonstration for proper execution.  Multiple tries for proper completion  Side to Side Pendulum Swing with Foam Dumbbells and Ankle Floats Multiple tries for proper completion.  Pt completes @ 75%, lacking full strength to completion at 100%      Water walking x 6 lengths backward with vc for glute activation.   Pt requires buoyancy for support and to offload joints with strengthening exercises. Viscosity of the water is needed for resistance of strengthening; water current perturbations provides challenge to standing balance unsupported, requiring increased core activation.        Plan - 08/30/21 1500     Clinical Impression Statement Pt with some different discomfort agter NCV test.  He reports it is slowly subsiding.  Advanded core strengthening using hand buoys in lap pool for added depth.  Pt with good understanding and able to get full core activation with exercises.  balance.  He is compliant with HEpC/o some left low back discomfort "feeling like its been used" upon completion.  He continues to progress as evidenced by toleration of increased difficulty/challneges with core stregnth and balance. He continues to work daily.    PT Treatment/Interventions ADLs/Self Care Home Management;Cryotherapy;Functional mobility training;Stair training;Gait training;Aquatic Therapy;Therapeutic activities;Therapeutic exercise;Balance training;Neuromuscular re-education;Patient/family education;Passive range of motion;Scar mobilization;Manual techniques;Taping    PT Next Visit Plan Discharge planning/recert    PT  Home Exercise Plan [id number]    Consulted and Agree with Plan of Care Patient             Patient will benefit from skilled therapeutic intervention in order to improve the following deficits and impairments:  Abnormal gait, Impaired sensation, Decreased activity tolerance, Decreased strength, Pain, Increased muscle spasms, Difficulty walking, Improper body mechanics, Decreased range of motion, Postural dysfunction  Visit Diagnosis: Pain in right hip  Difficulty in walking, not elsewhere classified  Muscle weakness (generalized)  Pain in right foot     Problem List Patient Active Problem List   Diagnosis Date Noted   Pneumonia due to COVID-19 virus 10/08/2020   Upper airway cough syndrome 10/08/2020   S/P right TKA 12/16/2019   Status post total knee replacement, right 12/16/2019   Hip fracture ([place]) 11/13/2018   Chronic anemia 11/06/2018   Reactive airway disease 11/06/2018   Femoral fracture ([place]) 11/05/2018   Morbid obesity ([place]) 12/20/2016   S/P left THA, AA 12/19/2016   Chest pain 08/15/2016   S/P right TH revision 02/09/2016   Hip instability 02/05/2016   H/O total hip arthroplasty 02/05/2016   S/P right THA, AA 02/01/2016   S/P knee replacement 11/27/2014   Unspecified viral hepatitis C without hepatic coma 08/28/2014   DYSPHONIA, CHRONIC 01/06/2010   NONSPECIFIC ABNORMAL ELECTROCARDIOGRAM 01/06/2010   HOARSENESS, CHRONIC 05/31/2009   Hypothyroidism 04/09/2009   Hyperlipidemia LDL goal <100 04/09/2009   HTN (hypertension) 04/09/2009   GERD (gastroesophageal reflux disease) 04/09/2009   Sleep apnea 04/09/2009    Annamarie Major) Ziemba MPT  08/30/2021, 3:19 PM  [facility] [place], [place],  [address] Phone: [phone]   Fax:  [phone]  Name: Samuel Goodwin MRN: [id number] Date of Birth: [birth year]

## 2021-09-01 ENCOUNTER — Encounter (HOSPITAL_BASED_OUTPATIENT_CLINIC_OR_DEPARTMENT_OTHER): Payer: Self-pay | Admitting: Physical Therapy

## 2021-09-01 ENCOUNTER — Ambulatory Visit (HOSPITAL_BASED_OUTPATIENT_CLINIC_OR_DEPARTMENT_OTHER): Payer: No Typology Code available for payment source | Admitting: Physical Therapy

## 2021-09-01 ENCOUNTER — Other Ambulatory Visit: Payer: Self-pay

## 2021-09-01 DIAGNOSIS — M25551 Pain in right hip: Secondary | ICD-10-CM | POA: Diagnosis not present

## 2021-09-01 DIAGNOSIS — M79671 Pain in right foot: Secondary | ICD-10-CM

## 2021-09-01 DIAGNOSIS — R262 Difficulty in walking, not elsewhere classified: Secondary | ICD-10-CM

## 2021-09-01 DIAGNOSIS — M6281 Muscle weakness (generalized): Secondary | ICD-10-CM

## 2021-09-01 NOTE — Addendum Note (Signed)
Addended by: Jonna Coup on: 09/01/2021 05:39 PM   Modules accepted: Orders

## 2021-09-01 NOTE — Therapy (Signed)
[place], [place], [address] Phone: [phone]   Fax:  [phone]  Physical Therapy Treatment/Recertification  Patient Details  Name: Samuel Goodwin MRN: [id number] Date of Birth: July 17, 1961 Referring Provider (PT): Tawnya Crook, MD   Encounter Date: 09/01/2021   PT End of Session - 09/01/21 1659     Visit Number 13    Number of Visits 15    Date for PT Re-Evaluation 09/30/21   extended to match VA auth period   Authorization Type VA    Authorization Time Period June 23- Oct 21    Authorization - Number of Visits 15    PT Start Time 1522    PT Stop Time 1600    PT Time Calculation (min) 38 min    Activity Tolerance Patient tolerated treatment well    Behavior During Therapy [name] for tasks assessed/performed             Past Medical History:  Diagnosis Date   Alcohol abuse    Sobriety since 1990   Anxiety    Arthritis    oa   Benign positional vertigo    Depression    Eardrum trauma    30 years ago/right ear   GERD (gastroesophageal reflux disease)    Headache    sinus   Hepatitis C 1994   hep c - ? 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   COLONOSCOPY  03/2013   HIP CLOSED REDUCTION Right 02/05/2016   Procedure: CLOSED MANIPULATION HIP;  Surgeon: Latanya Maudlin, MD;  [facility];  Service: Orthopedics;  Laterality: Right;   knee arthroscopic Bilateral    LARYNGOSCOPY  08/2009   Dr.[name]   NASAL FRACTURE SURGERY     age 60   right knee arthroscopic knee surgery  12 yrs ago   dr Theda Sers   THROAT SURGERY  aug, sept, Nov 16 2016   vocal cord  laser sugery X 3, Dr [name] , [facility]   TOTAL HIP ARTHROPLASTY Right 02/01/2016   Procedure: RIGHT TOTAL HIP ARTHROPLASTY ANTERIOR APPROACH;  Surgeon: Paralee Cancel, MD;  [facility];  Service: Orthopedics;  Laterality: Right;   TOTAL HIP ARTHROPLASTY Left 12/19/2016   Procedure: LEFT TOTAL HIP ARTHROPLASTY ANTERIOR APPROACH;  Surgeon: Paralee Cancel, MD;  [facility];  Service: Orthopedics;  Laterality: Left;   TOTAL HIP REVISION Right 11/11/2018   Procedure: right total hip arthroplasty revision, femoral stem;  Surgeon: Paralee Cancel, MD;  [facility];  Service: Orthopedics;  Laterality: Right;  71min   TOTAL KNEE ARTHROPLASTY Left 11/27/2014  Procedure: LEFT TOTAL KNEE ARTHROPLASTY;  Surgeon: Augustin Schooling, MD;  Location: [place];  Service: Orthopedics;  Laterality: Left;   TOTAL KNEE ARTHROPLASTY Right 12/16/2019   Procedure: TOTAL KNEE ARTHROPLASTY;  Surgeon: Paralee Cancel, MD;  [facility];  Service: Orthopedics;  Laterality: Right;  70 mins   TRANSTHORACIC ECHOCARDIOGRAM  11/08/2005   EF 68%, normal LV systolic function   UPPER GASTROINTESTINAL ENDOSCOPY  2010   Negative, Dr.[name]    There were no vitals filed for this visit.   Subjective Assessment - 09/01/21 1527     Subjective I had a scare where I could not walk on my Rt leg. Had a NCV test and get results on Monday. Water PT has been the best thing that happened to me in 8 years of PT. I still cannot go up stairs. I am starting to get stabbing pain in medial Rt  leg. My goal is still to walk the [employer].    Patient Stated Goals improve strength, improve how I walk    Currently in Pain? 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      Sensation   Additional Comments bil plantar aspect of feet N/T that is progressing      Posture/Postural Control   Posture Comments able to demo upright posture after he has loosened up      AROM   Overall AROM Comments able to extend Rt knee but has quad lag of approx 5 deg in [facility]      Strength   Overall Strength Comments able to stand from chair without use of UEs, 3/5 in bil hip flexion mostly limited by Rt groin pain      Palpation   Palpation comment no notable inom rotation      Ambulation/Gait   Gait Comments more fluid gait pattern noted overall- mild trunk rotation noted with arm swing bilateral; takes a few steps to decrease stiffness upon standing and demo flexed posture and short stpe length in initial steps                                    PT Education - 09/01/21 1703     Education Details see plan    Person(s) Educated Patient    Methods Explanation    Comprehension Verbalized understanding                 PT Long Term Goals - 09/01/21 1547       PT LONG TERM GOAL #1   Title I would like to be able to climb stairs using my Right leg    Baseline I think there were a couple of times that I was able  to walk up "like a human being" but  there are some time when I had to pull on rails      PT LONG TERM GOAL #2   Title Walk normal    Baseline it is getting better, the bottoms of my feet are numb, if the lateral Rt leg is tight I end up doing half steps      PT LONG TERM GOAL #3   Title build up leg strength and strengthen the core    Baseline I have learned a lot of good exercises, I am good for a couple of days and then bad- need to build endurance      PT LONG TERM GOAL #4   Title reduce Rt foot numbness    Baseline numbness is getting worse, worst when I am in my car                   Plan - 09/01/21 1727     Clinical Impression Statement Pt continues to have limitations do to chronic issues and changing medical status. NCV performed to determine conduction into extremities and will get outcome on Monday. He is enjoying doing the aquatic therapy and feels like it is very helpful to him. He has 2 more authorized visits from the [place] and we will request more after these expire. Considering all contributions to his pain, as outlined in history, it is important for him to continue exercise to continue challenging functional strength and endurance and address pain levels as they fluctuate. Rt knee noted to have some available joint mobility to obtain a little more extension but will require further work for flexibility and strength to be able to demo actively. Pt will cont to benefit from skilled PT to address deficits and meet functional goals.    PT Frequency --   2 more visits by 10/21   PT Treatment/Interventions ADLs/Self Care Home Management;Cryotherapy;Functional mobility training;Stair training;Gait training;Aquatic Therapy;Therapeutic activities;Therapeutic exercise;Balance training;Neuromuscular re-education;Patient/family education;Passive range of motion;Scar mobilization;Manual techniques;Taping    PT Next Visit Plan pt is recertified through [place] end date (10/21), will decrease frequency to work more  independently moving forward and will email to request more authorized visits.    PT Home Exercise Plan [id number]    Consulted and Agree with Plan of Care Patient             Patient will benefit from skilled therapeutic intervention in order to improve the following deficits and impairments:  Abnormal gait, Impaired sensation, Decreased activity tolerance, Decreased strength, Pain, Increased muscle spasms, Difficulty walking, Improper body mechanics, Decreased range of motion, Postural dysfunction  Visit Diagnosis: Pain in right hip  Difficulty in walking, not elsewhere classified  Muscle weakness (generalized)  Pain in right foot     Problem List Patient Active Problem List   Diagnosis Date Noted   Pneumonia due to COVID-19 virus 10/08/2020   Upper airway cough syndrome 10/08/2020   S/P right TKA 12/16/2019   Status post total knee replacement, right 12/16/2019   Hip fracture ([place]) 11/13/2018   Chronic anemia 11/06/2018   Reactive airway disease 11/06/2018   Femoral fracture ([place]) 11/05/2018   Morbid obesity ([place]) 12/20/2016   S/P left THA, AA 12/19/2016   Chest pain 08/15/2016   S/P right TH revision 02/09/2016   Hip instability 02/05/2016   H/O total hip arthroplasty 02/05/2016   S/P right THA, AA 02/01/2016   S/P knee replacement 11/27/2014   Unspecified viral hepatitis C without hepatic coma  08/28/2014   DYSPHONIA, CHRONIC 01/06/2010   NONSPECIFIC ABNORMAL ELECTROCARDIOGRAM 01/06/2010   HOARSENESS, CHRONIC 05/31/2009   Hypothyroidism 04/09/2009   Hyperlipidemia LDL goal <100 04/09/2009   HTN (hypertension) 04/09/2009   GERD (gastroesophageal reflux disease) 04/09/2009   Sleep apnea 04/09/2009   Dorthula Bier C. Annaleia Pence PT, DPT 09/01/21 5:37 PM  [facility] [address] [place], [place], [address] Phone: [phone]   Fax:  [phone]  Name: Samuel Goodwin MRN: [id number] Date of Birth:  09/04/1961

## 2021-09-06 ENCOUNTER — Ambulatory Visit
Admission: RE | Admit: 2021-09-06 | Discharge: 2021-09-06 | Disposition: A | Discharge status: Home / Self Care | Payer: Self-pay | Source: Ambulatory Visit | Attending: Cardiovascular Disease | Admitting: Cardiovascular Disease

## 2021-09-06 ENCOUNTER — Other Ambulatory Visit: Payer: Self-pay

## 2021-09-06 DIAGNOSIS — E785 Hyperlipidemia, unspecified: Secondary | ICD-10-CM

## 2021-09-14 ENCOUNTER — Ambulatory Visit (HOSPITAL_BASED_OUTPATIENT_CLINIC_OR_DEPARTMENT_OTHER): Payer: No Typology Code available for payment source | Attending: Family Medicine | Admitting: Physical Therapy

## 2021-09-14 ENCOUNTER — Other Ambulatory Visit: Payer: Self-pay

## 2021-09-14 DIAGNOSIS — R2689 Other abnormalities of gait and mobility: Secondary | ICD-10-CM | POA: Insufficient documentation

## 2021-09-14 DIAGNOSIS — R2681 Unsteadiness on feet: Secondary | ICD-10-CM | POA: Diagnosis present

## 2021-09-14 DIAGNOSIS — R262 Difficulty in walking, not elsewhere classified: Secondary | ICD-10-CM | POA: Insufficient documentation

## 2021-09-14 DIAGNOSIS — G8929 Other chronic pain: Secondary | ICD-10-CM | POA: Insufficient documentation

## 2021-09-14 DIAGNOSIS — R293 Abnormal posture: Secondary | ICD-10-CM | POA: Diagnosis present

## 2021-09-14 DIAGNOSIS — M25551 Pain in right hip: Secondary | ICD-10-CM | POA: Diagnosis present

## 2021-09-14 DIAGNOSIS — M6281 Muscle weakness (generalized): Secondary | ICD-10-CM | POA: Insufficient documentation

## 2021-09-14 DIAGNOSIS — M25561 Pain in right knee: Secondary | ICD-10-CM | POA: Diagnosis present

## 2021-09-14 NOTE — Therapy (Signed)
[place], [place], [address] Phone: [phone]   Fax:  [phone]  Physical Therapy Treatment  Patient Details  Name: Samuel Goodwin MRN: [id number] Date of Birth: 06-10-1961 Referring Provider (PT): Tawnya Crook, MD   Encounter Date: 09/14/2021   PT End of Session - 09/14/21 1425     Visit Number 14    Number of Visits 15    Date for PT Re-Evaluation 09/30/21   extended to match VA auth period   Authorization Type VA    Authorization Time Period June 23- Oct 21    Authorization - Number of Visits 15    PT Start Time 1415    PT Stop Time 1500    PT Time Calculation (min) 45 min    Equipment Utilized During Treatment Other (comment)    Activity Tolerance Patient tolerated treatment well    Behavior During Therapy [facility] for tasks assessed/performed             Past Medical History:  Diagnosis Date   Alcohol abuse    Sobriety since 1990   Anxiety    Arthritis    oa   Benign positional vertigo    Depression    Eardrum trauma    30 years ago/right ear   GERD (gastroesophageal reflux disease)    Headache    sinus   Hepatitis C 1994   hep c - ? 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   COLONOSCOPY  03/2013   HIP CLOSED REDUCTION Right 02/05/2016   Procedure: CLOSED MANIPULATION HIP;  Surgeon: Latanya Maudlin, MD;  [facility];  Service: Orthopedics;  Laterality: Right;   knee arthroscopic Bilateral    LARYNGOSCOPY  08/2009   Dr.[name]   NASAL FRACTURE SURGERY     age 76   right knee arthroscopic knee surgery  12 yrs ago   dr Theda Sers   THROAT SURGERY  aug, sept, Nov 16 2016   vocal cord  laser sugery X 3, Dr [name] , [facility]   TOTAL HIP ARTHROPLASTY Right 02/01/2016   Procedure: RIGHT TOTAL HIP ARTHROPLASTY ANTERIOR APPROACH;  Surgeon: Paralee Cancel, MD;  [facility];  Service: Orthopedics;  Laterality: Right;   TOTAL HIP ARTHROPLASTY Left 12/19/2016   Procedure: LEFT TOTAL HIP ARTHROPLASTY ANTERIOR APPROACH;  Surgeon: Paralee Cancel, MD;  [facility];  Service: Orthopedics;  Laterality: Left;   TOTAL HIP REVISION Right 11/11/2018   Procedure: right total hip arthroplasty revision, femoral stem;  Surgeon: Paralee Cancel, MD;  [facility];  Service: Orthopedics;  Laterality: Right;  3min   TOTAL KNEE ARTHROPLASTY Left 11/27/2014   Procedure: LEFT TOTAL KNEE ARTHROPLASTY;  Surgeon: Augustin Schooling, MD;  Location: [place];  Service: Orthopedics;  Laterality: Left;   TOTAL KNEE ARTHROPLASTY Right 12/16/2019   Procedure: TOTAL KNEE ARTHROPLASTY;  Surgeon: Paralee Cancel, MD;  [facility];  Service: Orthopedics;  Laterality: Right;  70 mins   TRANSTHORACIC ECHOCARDIOGRAM  11/08/2005   EF 68%, normal LV systolic function   UPPER GASTROINTESTINAL ENDOSCOPY  2010   Negative, Dr.[name]    There were no vitals filed for this visit.   Subjective Assessment - 09/14/21 1429     Subjective "Had a bad week or so, feels like I went backward"    Currently in Pain? Yes    Pain Score 5     Pain Location Back    Pain Orientation Right;Left    Pain Descriptors / Indicators  Aching;Shooting;Tightness    Pain Type Chronic pain    Pain Onset More than a month ago    Pain Frequency Intermittent                                             PT Long Term Goals - 09/01/21 1547       PT LONG TERM GOAL #1   Title I would like to be able to climb stairs using my Right leg    Baseline I think there were a couple of times that I was able to walk up "like a human being" but there are some time when I had to pull on rails      PT LONG TERM GOAL #2   Title Walk normal    Baseline it is getting better, the bottoms of my feet are numb, if the lateral Rt leg is tight I end up doing half steps      PT LONG TERM GOAL #3   Title build up leg strength and strengthen the core    Baseline I have learned a lot of good exercises, I am good for a couple of days and then bad- need to build endurance      PT LONG TERM GOAL #4   Title reduce Rt foot numbness    Baseline numbness is getting worse, worst when I am in my car            Pt seen for aquatic therapy today.  Treatment took place in water 3.25-4.8 ft in depth at the [facility] pool. Temp of water was 91.  Pt entered/exited the pool via stairs step to pattern independently with bilat rail.   Warm up Water walking forward, back and side stepping increasing step length and speed, cuing for UE placement to increase resistance x 4 lengths of pool   Seated Gastroc, hamstring and adductor stretching 3 x 30 sec each   Standing Stretching; hamstring and gastroc "runners stretch",  IT band and hip extensors  3 x 20 secs initially then IT band stretches post treatment 3 x 20 sec -LB stretch in pike position holding to handrail. - backward step up x 5 leading with rle -1/3 squats on bottom step x 4. Cues for weight bearing and push up effort through heel to isolate glute (strengthening)  Suspended Core strengthening: -Prone to Supine Transition with Foam Dumbells and Ankle  Floats: broken into segments:      -using  2 foam hand buoys standing to sup<>standing x 5 holding position x 10 seconds      -Knees to chest x 10 holding above position 10. Completed a second trial closed chain, PT securing feet x 10      -Standing<>prone x 5 holding position x 10 seconds Pt needing assist to hold position/gain position today      -prone to supine transition x 5 using ankle buoys    Pt requires buoyancy for support and to offload joints with strengthening exercises. Viscosity of the water is needed for resistance of strengthening; water current perturbations provides challenge to standing balance unsupported, requiring increased core activation.       Plan - 09/14/21 1506     Clinical Impression Statement NCV reports compressed nerves in cervical and lumbar spine.  Pt is being referred to neurologist.  Had increased discomofrt for past week maybe due to changing weather patterns.  He presents today with c/o right hip/IT band tightness.  With stretching area releived. Returned focus onto core stretching and strengthening. He completes all prescribed activities without icreased discomofrt.  Does need assistance with maintaining supine and prone suspension as he fatigues. Pt compliant with HEP, modifies some with increased discomfort.    Stability/Clinical Decision Making Evolving/Moderate complexity    Clinical Decision Making Moderate    Rehab Potential Good    PT Treatment/Interventions ADLs/Self Care Home Management;Cryotherapy;Functional mobility training;Stair training;Gait training;Aquatic Therapy;Therapeutic activities;Therapeutic exercise;Balance training;Neuromuscular re-education;Patient/family education;Passive range of motion;Scar mobilization;Manual techniques;Taping             Patient will benefit from skilled therapeutic intervention in order to improve the following deficits and impairments:  Abnormal gait, Impaired sensation, Decreased activity tolerance,  Decreased strength, Pain, Increased muscle spasms, Difficulty walking, Improper body mechanics, Decreased range of motion, Postural dysfunction  Visit Diagnosis: Pain in right hip  Difficulty in walking, not elsewhere classified  Muscle weakness (generalized)     Problem List Patient Active Problem List   Diagnosis Date Noted   Pneumonia due to COVID-19 virus 10/08/2020   Upper airway cough syndrome 10/08/2020   S/P right TKA 12/16/2019   Status post total knee replacement, right 12/16/2019   Hip fracture ([place]) 11/13/2018   Chronic anemia 11/06/2018   Reactive airway disease 11/06/2018   Femoral fracture ([place]) 11/05/2018   Morbid obesity ([place]) 12/20/2016   S/P left THA, AA 12/19/2016   Chest pain 08/15/2016   S/P right TH revision 02/09/2016   Hip instability 02/05/2016   H/O total hip arthroplasty 02/05/2016   S/P right THA, AA 02/01/2016   S/P knee replacement 11/27/2014   Unspecified viral hepatitis C without hepatic coma 08/28/2014   DYSPHONIA, CHRONIC 01/06/2010   NONSPECIFIC ABNORMAL ELECTROCARDIOGRAM 01/06/2010   HOARSENESS, CHRONIC 05/31/2009   Hypothyroidism 04/09/2009   Hyperlipidemia LDL goal <100 04/09/2009   HTN (hypertension) 04/09/2009   GERD (gastroesophageal reflux disease) 04/09/2009   Sleep apnea 04/09/2009    Annamarie Major) Debby Clyne MPT 09/14/2021, 3:23 PM  [facility] [address] [place], [place], [address] Phone: [phone]   Fax:  [phone]  Name: Samuel Goodwin MRN: [id number] Date of Birth: 1961-07-30

## 2021-09-27 ENCOUNTER — Ambulatory Visit (HOSPITAL_BASED_OUTPATIENT_CLINIC_OR_DEPARTMENT_OTHER): Payer: No Typology Code available for payment source | Admitting: Physical Therapy

## 2021-09-27 ENCOUNTER — Other Ambulatory Visit: Payer: Self-pay

## 2021-09-27 ENCOUNTER — Encounter (HOSPITAL_BASED_OUTPATIENT_CLINIC_OR_DEPARTMENT_OTHER): Payer: Self-pay | Admitting: Physical Therapy

## 2021-09-27 DIAGNOSIS — M6281 Muscle weakness (generalized): Secondary | ICD-10-CM

## 2021-09-27 DIAGNOSIS — M25551 Pain in right hip: Secondary | ICD-10-CM | POA: Diagnosis not present

## 2021-09-27 DIAGNOSIS — R2689 Other abnormalities of gait and mobility: Secondary | ICD-10-CM

## 2021-09-27 DIAGNOSIS — R262 Difficulty in walking, not elsewhere classified: Secondary | ICD-10-CM

## 2021-09-27 DIAGNOSIS — G8929 Other chronic pain: Secondary | ICD-10-CM

## 2021-09-27 DIAGNOSIS — R293 Abnormal posture: Secondary | ICD-10-CM

## 2021-09-27 DIAGNOSIS — R2681 Unsteadiness on feet: Secondary | ICD-10-CM

## 2021-09-27 NOTE — Therapy (Signed)
[place], [place], [address] Phone: [phone]   Fax:  [phone]  Physical Therapy Treatment  Patient Details  Name: Samuel Goodwin MRN: [id number] Date of Birth: 08/18/1961 Referring Provider (PT): Tawnya Crook, MD   Encounter Date: 09/27/2021   PT End of Session - 09/27/21 1536     Visit Number 15    Number of Visits 15    Date for PT Re-Evaluation 09/30/21   extended to match VA auth period   Authorization Type VA    Authorization Time Period June 23- Oct 21    Authorization - Number of Visits 15    PT Start Time 1535    PT Stop Time 7412    PT Time Calculation (min) 40 min    Equipment Utilized During Treatment Other (comment)    Activity Tolerance Patient tolerated treatment well    Behavior During Therapy [facility] for tasks assessed/performed             Past Medical History:  Diagnosis Date   Alcohol abuse    Sobriety since 1990   Anxiety    Arthritis    oa   Benign positional vertigo    Depression    Eardrum trauma    30 years ago/right ear   GERD (gastroesophageal reflux disease)    Headache    sinus   Hepatitis C 1994   hep c - ? 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   COLONOSCOPY  03/2013   HIP CLOSED REDUCTION Right 02/05/2016   Procedure: CLOSED MANIPULATION HIP;  Surgeon: Latanya Maudlin, MD;  [facility];  Service: Orthopedics;  Laterality: Right;   knee arthroscopic Bilateral    LARYNGOSCOPY  08/2009   Dr.[name]   NASAL FRACTURE SURGERY     age 13   right knee arthroscopic knee surgery  12 yrs ago   dr Theda Sers   THROAT SURGERY  aug, sept, Nov 16 2016   vocal cord  laser sugery X 3, Dr [name] , [facility]   TOTAL HIP ARTHROPLASTY Right 02/01/2016   Procedure: RIGHT TOTAL HIP ARTHROPLASTY ANTERIOR APPROACH;  Surgeon: Paralee Cancel, MD;  [facility];  Service: Orthopedics;  Laterality: Right;   TOTAL HIP ARTHROPLASTY Left 12/19/2016   Procedure: LEFT TOTAL HIP ARTHROPLASTY ANTERIOR APPROACH;  Surgeon: Paralee Cancel, MD;  [facility];  Service: Orthopedics;  Laterality: Left;   TOTAL HIP REVISION Right 11/11/2018   Procedure: right total hip arthroplasty revision, femoral stem;  Surgeon: Paralee Cancel, MD;  [facility];  Service: Orthopedics;  Laterality: Right;  37min   TOTAL KNEE ARTHROPLASTY Left 11/27/2014   Procedure: LEFT TOTAL KNEE ARTHROPLASTY;  Surgeon: Augustin Schooling, MD;  Location: [place];  Service: Orthopedics;  Laterality: Left;   TOTAL KNEE ARTHROPLASTY Right 12/16/2019   Procedure: TOTAL KNEE ARTHROPLASTY;  Surgeon: Paralee Cancel, MD;  [facility];  Service: Orthopedics;  Laterality: Right;  70 mins   TRANSTHORACIC ECHOCARDIOGRAM  11/08/2005   EF 68%, normal LV systolic function   UPPER GASTROINTESTINAL ENDOSCOPY  2010   Negative, Dr.[name]    There were no vitals filed for this visit.   Subjective Assessment - 09/27/21 1538     Subjective "[place] on Sat blowing leaves and fighting off a copperhead snake. Has xrays tht were neg for fracture.  Wearing insole inserts and feeling better"    Currently in Pain? Yes    Pain Score 7      Pain Location Back    Pain Orientation Right;Left    Pain Descriptors / Indicators Aching    Pain Onset More than a month ago    Pain Frequency Intermittent                                             PT Long Term Goals - 09/01/21 1547       PT LONG TERM GOAL #1   Title I would like to be able to climb stairs using my Right leg    Baseline I think there were a couple of times that I was able to walk up "like a human being" but there are some time when I had to pull on rails      PT LONG TERM GOAL #2   Title Walk normal    Baseline it is getting better, the bottoms of my feet are numb, if the lateral Rt leg is tight I end up doing half steps      PT LONG TERM GOAL #3   Title build up leg strength and strengthen the core    Baseline I have learned a lot of good exercises, I am good for a couple of days and then bad- need to build endurance      PT LONG TERM GOAL #4   Title reduce Rt foot numbness    Baseline numbness is getting worse, worst when I am in my car            Pt seen for aquatic therapy today.  Treatment took place in water 3.25-4.8 ft in depth at the [facility] pool. Temp of water was 91.  Pt entered/exited the pool via stairs step to pattern independently with bilat rail.   Warm up Water walking forward, back and side stepping increasing step length and speed, cuing for UE placement to increase resistance x 4 lengths of pool   Seated Gastroc, hamstring and adductor stretching 3 x 30 sec each   Standing Stretching; hamstring and gastroc "runners stretch",  IT band and hip extensors  3 x 30 sec -LB stretch in pike position holding to handrail. -resisted with water bands hip ext, add and abd x 10-15.  Hip extension small arch at end range x 10 R/L    Suspended Core strengthening: -Prone to Supine Transition with Foam Dumbells x 10  Supine suspension         -Knees to chest 2x 10       -  Standing<>prone x 5 holding  position x 10 seconds  -Side to side pendulum x 5     Pt requires buoyancy for support and to offload joints with strengthening exercises. Viscosity of the water is needed for resistance of strengthening; water current perturbations provides challenge to standing balance unsupported, requiring increased core activation.vc        Plan - 09/27/21 1620     Clinical Impression Statement RLE hamstring and quad cramping with side to side pendlum probably secondary to fatgue as completed at end of session.  Focused on glute/hip strengthening. Ended with core stregthening and stretching.  Pt tolerates session without difficulty other than cramping. Ankle xrayed without evidence of injury. Pt with additional session approved by [place].    Stability/Clinical Decision Making Evolving/Moderate complexity    Clinical Decision Making Moderate    Rehab Potential Good    PT Duration 6 weeks    PT Treatment/Interventions ADLs/Self Care Home Management;Cryotherapy;Functional mobility training;Stair training;Gait training;Aquatic Therapy;Therapeutic activities;Therapeutic exercise;Balance training;Neuromuscular re-education;Patient/family education;Passive range of motion;Scar mobilization;Manual techniques;Taping    PT Next Visit Plan Glute strengthening    PT Home Exercise Plan [id number]             Patient will benefit from skilled therapeutic intervention in order to improve the following deficits and impairments:  Abnormal gait, Impaired sensation, Decreased activity tolerance, Decreased strength, Pain, Increased muscle spasms, Difficulty walking, Improper body mechanics, Decreased range of motion, Postural dysfunction  Visit Diagnosis: Abnormal posture  Other abnormalities of gait and mobility  Unsteadiness on feet  Chronic pain of right knee  Difficulty in walking, not elsewhere classified  Muscle weakness (generalized)     Problem List Patient Active Problem List   Diagnosis Date  Noted   Pneumonia due to COVID-19 virus 10/08/2020   Upper airway cough syndrome 10/08/2020   S/P right TKA 12/16/2019   Status post total knee replacement, right 12/16/2019   Hip fracture ([place]) 11/13/2018   Chronic anemia 11/06/2018   Reactive airway disease 11/06/2018   Femoral fracture ([place]) 11/05/2018   Morbid obesity ([place]) 12/20/2016   S/P left THA, AA 12/19/2016   Chest pain 08/15/2016   S/P right TH revision 02/09/2016   Hip instability 02/05/2016   H/O total hip arthroplasty 02/05/2016   S/P right THA, AA 02/01/2016   S/P knee replacement 11/27/2014   Unspecified viral hepatitis C without hepatic coma 08/28/2014   DYSPHONIA, CHRONIC 01/06/2010   NONSPECIFIC ABNORMAL ELECTROCARDIOGRAM 01/06/2010   HOARSENESS, CHRONIC 05/31/2009   Hypothyroidism 04/09/2009   Hyperlipidemia LDL goal <100 04/09/2009   HTN (hypertension) 04/09/2009   GERD (gastroesophageal reflux disease) 04/09/2009   Sleep apnea 04/09/2009    Annamarie Major) Maximillian Habibi MPT 09/27/2021, 6:36 PM  [facility] [address] [place], [place], [address] Phone: [phone]   Fax:  [phone]  Name: Samuel Goodwin MRN: [id number] Date of Birth: Jan 16, 1961

## 2021-10-04 ENCOUNTER — Ambulatory Visit (HOSPITAL_BASED_OUTPATIENT_CLINIC_OR_DEPARTMENT_OTHER): Payer: No Typology Code available for payment source | Admitting: Physical Therapy

## 2021-10-04 ENCOUNTER — Other Ambulatory Visit: Payer: Self-pay

## 2021-10-04 ENCOUNTER — Encounter (HOSPITAL_BASED_OUTPATIENT_CLINIC_OR_DEPARTMENT_OTHER): Payer: Self-pay | Admitting: Physical Therapy

## 2021-10-04 DIAGNOSIS — R2681 Unsteadiness on feet: Secondary | ICD-10-CM

## 2021-10-04 DIAGNOSIS — R262 Difficulty in walking, not elsewhere classified: Secondary | ICD-10-CM

## 2021-10-04 DIAGNOSIS — R2689 Other abnormalities of gait and mobility: Secondary | ICD-10-CM

## 2021-10-04 DIAGNOSIS — M6281 Muscle weakness (generalized): Secondary | ICD-10-CM

## 2021-10-04 DIAGNOSIS — M25551 Pain in right hip: Secondary | ICD-10-CM | POA: Diagnosis not present

## 2021-10-04 DIAGNOSIS — R293 Abnormal posture: Secondary | ICD-10-CM

## 2021-10-04 DIAGNOSIS — G8929 Other chronic pain: Secondary | ICD-10-CM

## 2021-10-04 NOTE — Therapy (Signed)
[facility] [address] [place], [place], [address] Phone: [phone]   Fax:  [phone]  Physical Therapy Treatment  Patient Details  Name: Samuel Goodwin MRN: [id number] Date of Birth: 1961/07/26 Referring Provider (PT): Tawnya Crook, MD   Encounter Date: 10/04/2021   PT End of Session - 10/04/21 1529     Visit Number 16    Number of Visits 30    Date for PT Re-Evaluation 09/30/21   extended to match VA auth period   Authorization Type VA    Authorization Time Period 09/16/21-01/14/22    Authorization - Number of Visits 15    PT Start Time 1530    PT Stop Time 5993    PT Time Calculation (min) 45 min    Equipment Utilized During Treatment Other (comment)    Activity Tolerance Patient tolerated treatment well    Behavior During Therapy WFL for tasks assessed/performed             Past Medical History:  Diagnosis Date   Alcohol abuse    Sobriety since 1990   Anxiety    Arthritis    oa   Benign positional vertigo    Depression    Eardrum trauma    30 years ago/right ear   GERD (gastroesophageal reflux disease)    Headache    sinus   Hepatitis C 1994   hep c - ? 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   COLONOSCOPY  03/2013   HIP CLOSED REDUCTION Right 02/05/2016   Procedure: CLOSED MANIPULATION HIP;  Surgeon: Latanya Maudlin, MD;  Location: [facility];  Service: Orthopedics;  Laterality: Right;   knee arthroscopic Bilateral    LARYNGOSCOPY  08/2009   Dr.[name]   NASAL FRACTURE SURGERY     age 60   right knee arthroscopic knee surgery  12 yrs ago   dr Theda Sers   THROAT SURGERY  aug, sept, Nov 16 2016   vocal cord  laser sugery X 3, Dr [name] , [facility]   TOTAL HIP ARTHROPLASTY Right 02/01/2016   Procedure: RIGHT TOTAL HIP ARTHROPLASTY ANTERIOR APPROACH;  Surgeon: Paralee Cancel, MD;  Location: [facility];  Service: Orthopedics;  Laterality: Right;   TOTAL HIP ARTHROPLASTY Left 12/19/2016   Procedure: LEFT TOTAL HIP ARTHROPLASTY ANTERIOR APPROACH;  Surgeon: Paralee Cancel, MD;  Location: [facility];  Service: Orthopedics;  Laterality: Left;   TOTAL HIP REVISION Right 11/11/2018   Procedure: right total hip arthroplasty revision, femoral stem;  Surgeon: Paralee Cancel, MD;  Location: [facility];  Service: Orthopedics;  Laterality: Right;  60min  TOTAL KNEE ARTHROPLASTY Left 11/27/2014   Procedure: LEFT TOTAL KNEE ARTHROPLASTY;  Surgeon: Augustin Schooling, MD;  Location: [facility];  Service: Orthopedics;  Laterality: Left;   TOTAL KNEE ARTHROPLASTY Right 12/16/2019   Procedure: TOTAL KNEE ARTHROPLASTY;  Surgeon: Paralee Cancel, MD;  Location: [facility];  Service: Orthopedics;  Laterality: Right;  70 mins   TRANSTHORACIC ECHOCARDIOGRAM  11/08/2005   EF 68%, normal LV systolic function   UPPER GASTROINTESTINAL ENDOSCOPY  2010   Negative, Dr.[name]    There were no vitals filed for this visit.   Subjective Assessment - 10/04/21 1527     Subjective "Left foot still hurts. MD didn't want an MRI"    Pain Score 4     Pain Location Back    Pain Orientation Right;Left    Pain Descriptors / Indicators Aching    Pain Type Chronic pain    Pain  Onset More than a month ago                                             PT Long Term Goals - 09/01/21 1547       PT LONG TERM GOAL #1   Title I would like to be able to climb stairs using my Right leg    Baseline I think there were a couple of times that I was able to walk up "like a human being" but there are some time when I had to pull on rails      PT LONG TERM GOAL #2   Title Walk normal    Baseline it is getting better, the bottoms of my feet are numb, if the lateral Rt leg is tight I end up doing half steps      PT LONG TERM GOAL #3   Title build up leg strength and strengthen the core    Baseline I have learned a lot of good exercises, I am good for a couple of days and then bad- need to build endurance      PT LONG TERM GOAL #4   Title reduce Rt foot numbness    Baseline numbness is getting worse, worst when I am in my car            Pt seen for aquatic therapy today.  Treatment took place in water 3.25-4.8 ft in depth at the [facility] pool. Temp of water was 91.  Pt entered/exited the pool via stairs step to pattern independently with bilat rail.   Warm up Water walking forward, back and side stepping increasing step length and speed, cuing for UE placement to increase resistance x 4 lengths of pool   Tall kneeling quad stretch 3 x 30 sec    Standing Stretching; hamstring and gastroc "runners stretch",  IT band and hip extensors  3 x 30 sec -LB stretch in pike position holding to handrail. -resisted with water bands hip ext, add and abd x 10-15.  Hip extension small arch at end range x 10 R/L    Suspended Core strengthening: -Prone to Supine Transition with Foam Dumbells x 10 added ankle buoys for increased resistance requiring increased core actviation  Supine suspension         -Knees to chest x 10       -Standing<>prone x 5 holding position x 10 seconds   -Side to side pendulum x 10.  Attempted with ankle buoys  but unable to properly execute.     Pt requires buoyancy for support and to offload joints with strengthening exercises. Viscosity of the water is needed for resistance of strengthening; water current perturbations provides challenge to standing balance unsupported, requiring increased core activation.vc         Plan - 10/04/21 1741     Clinical Impression Statement Complaints of right foot pain limiting his amb comfort.  Completed core strengthening in lap pool for the added depth. Pt given VC for improved technique for added core acrtivtion. Able to add back ankle buoys increasing strength challenge with pendulum exercise.  Improved strength in bilat glutes as evidenced by increased hip extension trial using band.    Stability/Clinical Decision Making Evolving/Moderate complexity    Clinical Decision Making Moderate    Rehab Potential Good    PT Duration Other (comment)    PT Treatment/Interventions ADLs/Self Care Home Management;Cryotherapy;Functional mobility training;Stair training;Gait training;Aquatic Therapy;Therapeutic activities;Therapeutic exercise;Balance training;Neuromuscular re-education;Patient/family education;Passive range of motion;Scar mobilization;Manual techniques;Taping             Patient will benefit from skilled therapeutic intervention in order to improve the following deficits and impairments:  Abnormal gait, Impaired sensation, Decreased activity tolerance, Decreased strength, Pain, Increased muscle spasms, Difficulty walking, Improper body mechanics, Decreased range of motion, Postural dysfunction  Visit Diagnosis: Abnormal posture  Chronic pain of right knee  Difficulty in walking, not elsewhere classified  Muscle weakness (generalized)  Unsteadiness on feet  Other abnormalities of gait and mobility     Problem List Patient Active Problem List   Diagnosis Date Noted   Pneumonia due to COVID-19 virus 10/08/2020   Upper airway cough syndrome  10/08/2020   S/P right TKA 12/16/2019   Status post total knee replacement, right 12/16/2019   Hip fracture ([place]) 11/13/2018   Chronic anemia 11/06/2018   Reactive airway disease 11/06/2018   Femoral fracture ([place]) 11/05/2018   Morbid obesity ([place]) 12/20/2016   S/P left THA, AA 12/19/2016   Chest pain 08/15/2016   S/P right TH revision 02/09/2016   Hip instability 02/05/2016   H/O total hip arthroplasty 02/05/2016   S/P right THA, AA 02/01/2016   S/P knee replacement 11/27/2014   Unspecified viral hepatitis C without hepatic coma 08/28/2014   DYSPHONIA, CHRONIC 01/06/2010   NONSPECIFIC ABNORMAL ELECTROCARDIOGRAM 01/06/2010   HOARSENESS, CHRONIC 05/31/2009   Hypothyroidism 04/09/2009   Hyperlipidemia LDL goal <100 04/09/2009   HTN (hypertension) 04/09/2009   GERD (gastroesophageal reflux disease) 04/09/2009   Sleep apnea 04/09/2009    Vedia Pereyra, PT 10/04/2021, 5:53 PM  [facility] [address] [place], [place], [address] Phone: [phone]   Fax:  [phone]  Name: Samuel Goodwin MRN: [id number] Date of Birth: [birth year]

## 2021-10-11 ENCOUNTER — Ambulatory Visit (HOSPITAL_BASED_OUTPATIENT_CLINIC_OR_DEPARTMENT_OTHER): Payer: No Typology Code available for payment source | Attending: Family Medicine | Admitting: Physical Therapy

## 2021-10-11 ENCOUNTER — Other Ambulatory Visit: Payer: Self-pay

## 2021-10-11 ENCOUNTER — Encounter (HOSPITAL_BASED_OUTPATIENT_CLINIC_OR_DEPARTMENT_OTHER): Payer: Self-pay | Admitting: Physical Therapy

## 2021-10-11 DIAGNOSIS — M25551 Pain in right hip: Secondary | ICD-10-CM | POA: Insufficient documentation

## 2021-10-11 DIAGNOSIS — G8929 Other chronic pain: Secondary | ICD-10-CM

## 2021-10-11 DIAGNOSIS — R2681 Unsteadiness on feet: Secondary | ICD-10-CM

## 2021-10-11 DIAGNOSIS — R293 Abnormal posture: Secondary | ICD-10-CM | POA: Diagnosis not present

## 2021-10-11 DIAGNOSIS — R2689 Other abnormalities of gait and mobility: Secondary | ICD-10-CM | POA: Diagnosis present

## 2021-10-11 DIAGNOSIS — M25561 Pain in right knee: Secondary | ICD-10-CM | POA: Diagnosis present

## 2021-10-11 DIAGNOSIS — R262 Difficulty in walking, not elsewhere classified: Secondary | ICD-10-CM | POA: Diagnosis present

## 2021-10-11 DIAGNOSIS — M6281 Muscle weakness (generalized): Secondary | ICD-10-CM | POA: Diagnosis present

## 2021-10-11 NOTE — Patient Instructions (Signed)
Proper donning of compression hose: Do not fold top over

## 2021-10-11 NOTE — Therapy (Signed)
[facility] [address] [place], [place], [address] Phone: [phone]   Fax:  [phone]  Physical Therapy Treatment  Patient Details  Name: Samuel Goodwin MRN: [id number] Date of Birth: [birth year] Referring Provider (PT): Tawnya Crook, MD   Encounter Date: 10/11/2021   PT End of Session - 10/11/21 1610     Visit Number 17    Number of Visits 30    Date for PT Re-Evaluation 01/12/22    Authorization Type [facility]    Authorization Time Period 09/16/21-01/14/22    PT Start Time 1615    PT Stop Time 1700    PT Time Calculation (min) 45 min    Equipment Utilized During Treatment Other (comment)    Activity Tolerance Patient tolerated treatment well    Behavior During Therapy WFL for tasks assessed/performed             Past Medical History:  Diagnosis Date   Alcohol abuse    Sobriety since 1990   Anxiety    Arthritis    oa   Benign positional vertigo    Depression    Eardrum trauma    30 years ago/right ear   GERD (gastroesophageal reflux disease)    Headache    sinus   Hepatitis C 1994   hep c - ? 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   COLONOSCOPY  03/2013   HIP CLOSED REDUCTION Right 02/05/2016   Procedure: CLOSED MANIPULATION HIP;  Surgeon: Latanya Maudlin, MD;  [facility];  Service: Orthopedics;  Laterality: Right;   knee arthroscopic Bilateral    LARYNGOSCOPY  08/2009   Dr.[name]   NASAL FRACTURE SURGERY     age 60   right knee arthroscopic knee surgery  12 yrs ago   dr Theda Sers   THROAT SURGERY  aug, sept, Nov 16 2016   vocal cord  laser sugery X 3, Dr [name] , [facility]   TOTAL HIP ARTHROPLASTY Right 02/01/2016   Procedure: RIGHT TOTAL HIP ARTHROPLASTY ANTERIOR APPROACH;  Surgeon: Paralee Cancel, MD;  [facility];  Service: Orthopedics;  Laterality: Right;   TOTAL HIP ARTHROPLASTY Left 12/19/2016   Procedure: LEFT TOTAL HIP ARTHROPLASTY ANTERIOR APPROACH;  Surgeon: Paralee Cancel, MD;  Location: [facility];  Service: Orthopedics;  Laterality: Left;   TOTAL HIP REVISION Right 11/11/2018   Procedure: right total hip arthroplasty revision, femoral stem;  Surgeon: Paralee Cancel, MD;  Location: [facility];  Service: Orthopedics;  Laterality: Right;  47mn   TOTAL KNEE ARTHROPLASTY Left 11/27/2014   Procedure: LEFT TOTAL KNEE ARTHROPLASTY;  Surgeon: SDoran Heater  [name], MD;  Location: [facility];  Service: Orthopedics;  Laterality: Left;   TOTAL KNEE ARTHROPLASTY Right 12/16/2019   Procedure: TOTAL KNEE ARTHROPLASTY;  Surgeon: Paralee Cancel, MD;  [facility];  Service: Orthopedics;  Laterality: Right;  70 mins   TRANSTHORACIC ECHOCARDIOGRAM  11/08/2005   EF 68%, normal LV systolic function   UPPER GASTROINTESTINAL ENDOSCOPY  2010   Negative, Dr.[name]    There were no vitals filed for this visit.   Subjective Assessment - 10/11/21 1614     Subjective "I fell twice on Sat helping a neighbor do some yard work.  I have been wearing some compression socks which has helped my foot pain.    Currently in Pain? Yes    Pain Score 7     Pain Location Back    Pain Orientation Left    Pain Descriptors / Indicators Aching     Pain Type Chronic pain    Pain Onset More than a month ago    Pain Frequency Intermittent                                             PT Long Term Goals - 10/04/21 1754       PT LONG TERM GOAL #1   Baseline Still needing to pull on the rails but am able to use my right leg more often.    Status Achieved      PT LONG TERM GOAL #2   Baseline Right foot pain increasing altalgic gait.    Status Partially Met      PT LONG TERM GOAL #3   Baseline "I feel like I am getting stronger, I am stronger than I was, it still feels it comes and goes"    Status On-going      PT LONG TERM GOAL #4   Baseline "Numbness doesn't seem like its getting better. I don't think it will"    Status On-going             Pt seen for aquatic therapy today.  Treatment took place in water 3.25-4.8 ft in depth at the [facility] pool. Temp of water was 91.  Pt entered/exited the pool via stairs step to pattern independently with bilat rail.   Warm up Water walking forward, back and side stepping increasing step length and speed, cuing for UE placement to increase resistance x 4 lengths of pool   Tall kneeling quad stretch 3 x 30 sec    Standing Stretching; hamstring and gastroc "runners stretch",  IT band and hip extensors  3 x 30 sec -LB stretch in pike position holding to handrail.     Standing Balance challenges: Sitting Balance with Arm Raise on Pool Noodle Kneeling Balance with Arm Raise on Pool Noodle High Knee Balance with Forward Walking and Hand Floats High Knee Balance with Backward Walking with Hand Floats     Pt requires buoyancy for support and to offload joints with strengthening exercises. Viscosity of the water is needed for resistance of strengthening; water current perturbations provides challenge to standing balance unsupported, requiring increased core activation.vc         Plan - 10/11/21 1700     Clinical Impression Statement  some LB discomfort from fall over w/e.  Focus today on balance in sitting, tall kneeling and standing (forward and backward orientation).  Returned to standing  balance also on noodle forward as well as tandem. Excellent toleration to session. Pt instructed to continue with core strengtheing HEP.    Stability/Clinical Decision Making Evolving/Moderate complexity    Clinical Decision Making Moderate    Rehab Potential Good    PT Frequency 1x / week    PT Duration 12 weeks    PT Treatment/Interventions ADLs/Self Care Home Management;Cryotherapy;Functional mobility training;Stair training;Gait training;Aquatic Therapy;Therapeutic activities;Therapeutic exercise;Balance training;Neuromuscular re-education;Patient/family education;Passive range of motion;Scar mobilization;Manual techniques;Taping    PT Next Visit Plan balance and glut strengtheing    PT Home Exercise Plan [id number]           Patient will benefit from skilled therapeutic intervention in order to improve the following deficits and impairments:  Abnormal gait, Impaired sensation, Decreased activity tolerance, Decreased strength, Pain, Increased muscle spasms, Difficulty walking, Improper body mechanics, Decreased range of motion, Postural dysfunction  Visit Diagnosis: Abnormal posture  Difficulty in walking, not elsewhere classified  Muscle weakness (generalized)  Chronic pain of right knee  Unsteadiness on feet  Other abnormalities of gait and mobility     Problem List Patient Active Problem List   Diagnosis Date Noted   Pneumonia due to COVID-19 virus 10/08/2020   Upper airway cough syndrome 10/08/2020   S/P right TKA 12/16/2019   Status post total knee replacement, right 12/16/2019   Hip fracture ([place]) 11/13/2018   Chronic anemia 11/06/2018   Reactive airway disease 11/06/2018   Femoral fracture ([place]) 11/05/2018   Morbid obesity ([place]) 12/20/2016   S/P left THA, AA 12/19/2016   Chest pain 08/15/2016   S/P right  TH revision 02/09/2016   Hip instability 02/05/2016   H/O total hip arthroplasty 02/05/2016   S/P right THA, AA 02/01/2016   S/P knee replacement 11/27/2014   Unspecified viral hepatitis C without hepatic coma 08/28/2014   DYSPHONIA, CHRONIC 01/06/2010   NONSPECIFIC ABNORMAL ELECTROCARDIOGRAM 01/06/2010   HOARSENESS, CHRONIC 05/31/2009   Hypothyroidism 04/09/2009   Hyperlipidemia LDL goal <100 04/09/2009   HTN (hypertension) 04/09/2009   GERD (gastroesophageal reflux disease) 04/09/2009   Sleep apnea 04/09/2009    Annamarie Major) Trinidad Petron MPT 10/11/2021, 6:28 PM  [facility] [address] [place], [place], [address] Phone: [phone]   Fax:  [phone]  Name: Jamaar Howes MRN: [id number] Date of Birth: 02-24-1961

## 2021-10-14 ENCOUNTER — Ambulatory Visit (HOSPITAL_BASED_OUTPATIENT_CLINIC_OR_DEPARTMENT_OTHER): Payer: No Typology Code available for payment source | Admitting: Physical Therapy

## 2021-10-18 ENCOUNTER — Encounter (HOSPITAL_BASED_OUTPATIENT_CLINIC_OR_DEPARTMENT_OTHER): Payer: Self-pay | Admitting: Physical Therapy

## 2021-10-18 ENCOUNTER — Other Ambulatory Visit: Payer: Self-pay

## 2021-10-18 ENCOUNTER — Ambulatory Visit (HOSPITAL_BASED_OUTPATIENT_CLINIC_OR_DEPARTMENT_OTHER): Payer: No Typology Code available for payment source | Admitting: Physical Therapy

## 2021-10-18 DIAGNOSIS — M25561 Pain in right knee: Secondary | ICD-10-CM

## 2021-10-18 DIAGNOSIS — R262 Difficulty in walking, not elsewhere classified: Secondary | ICD-10-CM

## 2021-10-18 DIAGNOSIS — R2689 Other abnormalities of gait and mobility: Secondary | ICD-10-CM

## 2021-10-18 DIAGNOSIS — G8929 Other chronic pain: Secondary | ICD-10-CM

## 2021-10-18 DIAGNOSIS — R293 Abnormal posture: Secondary | ICD-10-CM

## 2021-10-18 DIAGNOSIS — R2681 Unsteadiness on feet: Secondary | ICD-10-CM

## 2021-10-18 DIAGNOSIS — M6281 Muscle weakness (generalized): Secondary | ICD-10-CM

## 2021-10-18 NOTE — Therapy (Signed)
[facility] [address] [place], [place], [address] Phone: [phone]   Fax:  [phone]  Physical Therapy Treatment  Patient Details  Name: Samuel Goodwin MRN: [id number] Date of Birth: 1961-07-06 Referring Provider (PT): Tawnya Crook, MD   Encounter Date: 10/18/2021   PT End of Session - 10/18/21 1559     Visit Number 18    Number of Visits 30    Date for PT Re-Evaluation 01/12/22    Authorization Type [facility]    Authorization Time Period 09/16/21-01/14/22    PT Start Time 1446    PT Stop Time 1533    PT Time Calculation (min) 47 min             Past Medical History:  Diagnosis Date   Alcohol abuse    Sobriety since 1990   Anxiety    Arthritis    oa   Benign positional vertigo    Depression    Eardrum trauma    30 years ago/right ear   GERD (gastroesophageal reflux disease)    Headache    sinus   Hepatitis C 1994   hep c - ? 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   COLONOSCOPY  03/2013   HIP CLOSED REDUCTION Right 02/05/2016   Procedure: CLOSED MANIPULATION HIP;  Surgeon: Latanya Maudlin, MD;  Location: [facility];  Service:  Orthopedics;  Laterality: Right;   knee arthroscopic Bilateral    LARYNGOSCOPY  08/2009   Dr.[name]   NASAL FRACTURE SURGERY     age 56   right knee arthroscopic knee surgery  12 yrs ago   dr Theda Sers   THROAT SURGERY  aug, sept, Nov 16 2016   vocal cord  laser sugery X 3, Dr [name] , [facility]   TOTAL HIP ARTHROPLASTY Right 02/01/2016   Procedure: RIGHT TOTAL HIP ARTHROPLASTY ANTERIOR APPROACH;  Surgeon: Paralee Cancel, MD;  Location: [facility];  Service: Orthopedics;  Laterality: Right;   TOTAL HIP ARTHROPLASTY Left 12/19/2016   Procedure: LEFT TOTAL HIP ARTHROPLASTY ANTERIOR APPROACH;  Surgeon: Paralee Cancel, MD;  Location: [facility];  Service: Orthopedics;  Laterality: Left;   TOTAL HIP REVISION Right 11/11/2018   Procedure: right total hip arthroplasty revision, femoral stem;  Surgeon: Paralee Cancel, MD;  Location: [facility];  Service: Orthopedics;  Laterality: Right;  40mn   TOTAL KNEE ARTHROPLASTY Left 11/27/2014   Procedure: LEFT TOTAL KNEE ARTHROPLASTY;  Surgeon: SAugustin Schooling MD;  [facility]  Service: Orthopedics;  Laterality: Left;   TOTAL KNEE ARTHROPLASTY Right 12/16/2019   Procedure: TOTAL KNEE ARTHROPLASTY;  Surgeon: OAlvan Dame  Rodman Key, MD;  Location: [facility];  Service: Orthopedics;  Laterality: Right;  70 mins   TRANSTHORACIC ECHOCARDIOGRAM  11/08/2005   EF 68%, normal LV systolic function   UPPER GASTROINTESTINAL ENDOSCOPY  2010   Negative, Dr.[name]    There were no vitals filed for this visit.   Subjective Assessment - 10/18/21 1554     Subjective Myleft lb has been soar since I fell.    Pain Score 4     Pain Location Back    Pain Orientation Left    Pain Descriptors / Indicators Aching    Pain Type Chronic pain                                             PT Long Term Goals - 10/04/21 1754       PT LONG TERM GOAL #1   Baseline Still needing to pull on the rails but am able to use my right leg more often.    Status Achieved      PT LONG  TERM GOAL #2   Baseline Right foot pain increasing altalgic gait.    Status Partially Met      PT LONG TERM GOAL #3   Baseline "I feel like I am getting stronger, I am stronger than I was, it still feels it comes and goes"    Status On-going      PT LONG TERM GOAL #4   Baseline "Numbness doesn't seem like its getting better. I don't think it will"    Status On-going             Pt seen for aquatic therapy today.  Treatment took place in water 3.25-4.8 ft in depth at the [facility] pool. Temp of water was 91.  Pt entered/exited the pool via stairs step to pattern independently with bilat rail.   Warm up Water walking forward, back and side stepping increasing step length and speed, cuing for UE placement to increase resistance x 4 lengths of pool   Standing Stretching; hamstring and gastroc "runners stretch",  IT band and hip extensors  3 x 30 sec -LB stretch in pike position holding to handrail. Hip hiking right and left for opposite stretch -Tall kneeling quad stretch 3 x 30 sec    Water Pilates for core strength and balance Using yellow noodle: -Lat pulls 3 x 10 -Planks multiple attempts to gain and maintain position.   --Modified to complete with forefoot on ground -Planks with forward hip kicks (hip flex) --Modified planks with hip extension Reverse planks multiple attempts to gain and maintain position as well as complete movement. -verbal and tc as well as demonstration for proper execution   Pt requires buoyancy for support and to offload joints with strengthening exercises. Viscosity of the water is needed for resistance of strengthening; water current perturbations provides challenge to standing balance unsupported, requiring increased core activation.       Patient will benefit from skilled therapeutic intervention in order to improve the following deficits and impairments:     Visit Diagnosis: Abnormal posture  Chronic pain of right  knee  Difficulty in walking, not elsewhere classified  Muscle weakness (generalized)  Unsteadiness on feet  Other abnormalities of gait and mobility     Problem List Patient Active Problem List   Diagnosis Date Noted   Pneumonia due to COVID-19 virus 10/08/2020  Upper airway cough syndrome 10/08/2020   S/P right TKA 12/16/2019   Status post total knee replacement, right 12/16/2019   Hip fracture ([place]) 11/13/2018   Chronic anemia 11/06/2018   Reactive airway disease 11/06/2018   Femoral fracture ([place]) 11/05/2018   Morbid obesity ([place]) 12/20/2016   S/P left THA, AA 12/19/2016   Chest pain 08/15/2016   S/P right TH revision 02/09/2016   Hip instability 02/05/2016   H/O total hip arthroplasty 02/05/2016   S/P right THA, AA 02/01/2016   S/P knee replacement 11/27/2014   Unspecified viral hepatitis C without hepatic coma 08/28/2014   DYSPHONIA, CHRONIC 01/06/2010   NONSPECIFIC ABNORMAL ELECTROCARDIOGRAM 01/06/2010   HOARSENESS, CHRONIC 05/31/2009   Hypothyroidism 04/09/2009   Hyperlipidemia LDL goal <100 04/09/2009   HTN (hypertension) 04/09/2009   GERD (gastroesophageal reflux disease) 04/09/2009   Sleep apnea 04/09/2009    Annamarie Major) Caydance Kuehnle MPT 10/18/2021, 4:07 PM  [facility] [address] [place], [place], [address] Phone: [phone]   Fax:  [phone]  Name: Samuel Goodwin MRN: [id number] Date of Birth: 05-06-1961

## 2021-10-25 ENCOUNTER — Ambulatory Visit (HOSPITAL_BASED_OUTPATIENT_CLINIC_OR_DEPARTMENT_OTHER): Payer: No Typology Code available for payment source | Admitting: Physical Therapy

## 2021-11-01 ENCOUNTER — Ambulatory Visit (HOSPITAL_BASED_OUTPATIENT_CLINIC_OR_DEPARTMENT_OTHER): Payer: Self-pay | Admitting: Physical Therapy

## 2021-11-08 ENCOUNTER — Encounter (HOSPITAL_BASED_OUTPATIENT_CLINIC_OR_DEPARTMENT_OTHER): Payer: Self-pay | Admitting: Physical Therapy

## 2021-11-08 ENCOUNTER — Other Ambulatory Visit: Payer: Self-pay

## 2021-11-08 ENCOUNTER — Ambulatory Visit (HOSPITAL_BASED_OUTPATIENT_CLINIC_OR_DEPARTMENT_OTHER): Payer: No Typology Code available for payment source | Admitting: Physical Therapy

## 2021-11-08 DIAGNOSIS — R293 Abnormal posture: Secondary | ICD-10-CM | POA: Diagnosis not present

## 2021-11-08 DIAGNOSIS — M25551 Pain in right hip: Secondary | ICD-10-CM

## 2021-11-08 DIAGNOSIS — R262 Difficulty in walking, not elsewhere classified: Secondary | ICD-10-CM

## 2021-11-08 DIAGNOSIS — M6281 Muscle weakness (generalized): Secondary | ICD-10-CM

## 2021-11-08 NOTE — Therapy (Signed)
[facility] [address] [place], [place], [address] Phone: [phone]   Fax:  [phone]  Physical Therapy Treatment  Patient Details  Name: Samuel Goodwin MRN: [id number] Date of Birth: 1961-04-06 Referring Provider (PT): Tawnya Crook, MD   Encounter Date: 11/08/2021   PT End of Session - 11/08/21 1459     Visit Number 19    Number of Visits 30    Date for PT Re-Evaluation 01/12/22    Authorization Type [facility]    Authorization Time Period 09/16/21-01/14/22    PT Start Time 1446    PT Stop Time 1530    PT Time Calculation (min) 44 min    Equipment Utilized During Treatment Other (comment)    Activity Tolerance Patient tolerated treatment well    Behavior During Therapy WFL for tasks assessed/performed             Past Medical History:  Diagnosis Date   Alcohol abuse    Sobriety since 1990   Anxiety    Arthritis    oa   Benign positional vertigo    Depression    Eardrum trauma    30 years ago/right ear   GERD (gastroesophageal reflux disease)    Headache    sinus   Hepatitis C 1994   hep c - ? 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   COLONOSCOPY  03/2013   HIP CLOSED REDUCTION Right 02/05/2016   Procedure: CLOSED MANIPULATION HIP;  Surgeon: Latanya Maudlin, MD;  [facility]: [facility];  Service: Orthopedics;  Laterality: Right;   knee arthroscopic Bilateral    LARYNGOSCOPY  08/2009   Dr.[name]   NASAL FRACTURE SURGERY     age 60   right knee arthroscopic knee surgery  12 yrs ago   dr Theda Sers   THROAT SURGERY  aug, sept, Nov 16 2016   vocal cord  laser sugery X 3, Dr [name] , [facility]   TOTAL HIP ARTHROPLASTY Right 02/01/2016   Procedure: RIGHT TOTAL HIP ARTHROPLASTY ANTERIOR APPROACH;  Surgeon: Paralee Cancel, MD;  [facility];  Service: Orthopedics;  Laterality: Right;   TOTAL HIP ARTHROPLASTY Left 12/19/2016   Procedure: LEFT TOTAL HIP ARTHROPLASTY ANTERIOR APPROACH;  Surgeon: Paralee Cancel, MD;  Location: [facility];  Service: Orthopedics;  Laterality: Left;   TOTAL HIP REVISION Right 11/11/2018   Procedure: right total hip arthroplasty revision, femoral stem;  Surgeon: Paralee Cancel, MD;  Location: [facility];  Service: Orthopedics;  Laterality: Right;  79mn   TOTAL KNEE ARTHROPLASTY Left 11/27/2014   Procedure: LEFT TOTAL KNEE ARTHROPLASTY;  Surgeon: SDoran Heater  [name], MD;  Location: [facility];  Service: Orthopedics;  Laterality: Left;   TOTAL KNEE ARTHROPLASTY Right 12/16/2019   Procedure: TOTAL KNEE ARTHROPLASTY;  Surgeon: Paralee Cancel, MD;  [facility];  Service: Orthopedics;  Laterality: Right;  70 mins   TRANSTHORACIC ECHOCARDIOGRAM  11/08/2005   EF 68%, normal LV systolic function   UPPER GASTROINTESTINAL ENDOSCOPY  2010   Negative, Dr.[name]    There were no vitals filed for this visit.   Subjective Assessment - 11/08/21 1455     Subjective Had to back off from therapy and exercise because I was hurting so bad from my falls.    Currently in Pain? Yes    Pain Score 5     Pain Location Back    Pain Orientation Left    Pain Descriptors / Indicators Aching    Pain Type Chronic pain                                              PT Long Term Goals - 10/04/21 1754       PT LONG TERM GOAL #1   Baseline Still needing to pull on the rails but am able to use my right leg more often.    Status Achieved      PT LONG TERM GOAL #2   Baseline Right foot pain increasing altalgic gait.    Status Partially Met      PT LONG TERM GOAL #3   Baseline "I feel like I am getting stronger, I am stronger than I was, it still feels it comes and goes"    Status On-going      PT LONG TERM GOAL #4   Baseline "Numbness doesn't seem like its getting better. I don't think it will"    Status On-going            Pt seen for aquatic therapy today.  Treatment took place in water 3.25-4.8 ft in depth at the [facility] pool. Temp of water was 91.  Pt entered/exited the pool via stairs step to pattern independently with bilat rail.   Warm up Water walking forward, back and side stepping increasing step length and speed, cuing for UE placement to increase resistance x 4 lengths of pool   Standing Stretching; hamstring and gastroc "runners stretch",  IT band and hip extensors  3 x 30 sec -LB stretch in pike position holding to handrail. Hip hiking right and left for opposite stretch -Tall kneeling quad stretch 3 x 30 sec    Water Pilates for core strength and balance Using yellow noodle: Modified plank x 3 for 30 secs -Lat pulls 3 x 10 -Planks multiple attempts to gain and maintain position. Hold x 20-25 sec -Planks with forward hip kicks (hip flex) 2 x 8 rep Reverse planks multiple attempts to gain and maintain position as well as complete movement. Broke down into sgments. Begain with sitting balance on noodle progressed to extending noodle under buttock and balancing then moved to plank. -verbal and tc as well as demonstration for proper execution    Pt requires buoyancy for support and to offload joints with strengthening exercises. Viscosity of the  water is needed for resistance of strengthening; water current perturbations provides challenge to standing balance unsupported, requiring increased core activation.       Plan - 11/08/21 1505  Clinical Impression Statement Pt reports compliance with HEP.  Reinstruction on pilates positioning in order to complete indep. Pt directed through positions breaking each one down into individual segments which focused first on mindful muscle movements, then balance then position.  He tolerates very well.  Demonstrates improvement with execution and core control.    Stability/Clinical Decision Making Evolving/Moderate complexity    Clinical Decision Making Moderate    Rehab Potential Good    PT Frequency 1x / week    PT Duration 12 weeks    PT Treatment/Interventions ADLs/Self Care Home Management;Cryotherapy;Functional mobility training;Stair training;Gait training;Aquatic Therapy;Therapeutic activities;Therapeutic exercise;Balance training;Neuromuscular re-education;Patient/family education;Passive range of motion;Scar mobilization;Manual techniques;Taping    PT Next Visit Plan balance and glut strengtheing    PT Home Exercise Plan [id number]             Patient will benefit from skilled therapeutic intervention in order to improve the following deficits and impairments:  Abnormal gait, Impaired sensation, Decreased activity tolerance, Decreased strength, Pain, Increased muscle spasms, Difficulty walking, Improper body mechanics, Decreased range of motion, Postural dysfunction  Visit Diagnosis: Pain in right hip  Difficulty in walking, not elsewhere classified  Muscle weakness (generalized)     Problem List Patient Active Problem List   Diagnosis Date Noted   Pneumonia due to COVID-19 virus 10/08/2020   Upper airway cough syndrome 10/08/2020   S/P right TKA 12/16/2019   Status post total knee replacement, right 12/16/2019   Hip fracture ([place]) 11/13/2018   Chronic anemia  11/06/2018   Reactive airway disease 11/06/2018   Femoral fracture ([place]) 11/05/2018   Morbid obesity ([place]) 12/20/2016   S/P left THA, AA 12/19/2016   Chest pain 08/15/2016   S/P right TH revision 02/09/2016   Hip instability 02/05/2016   H/O total hip arthroplasty 02/05/2016   S/P right THA, AA 02/01/2016   S/P knee replacement 11/27/2014   Unspecified viral hepatitis C without hepatic coma 08/28/2014   DYSPHONIA, CHRONIC 01/06/2010   NONSPECIFIC ABNORMAL ELECTROCARDIOGRAM 01/06/2010   HOARSENESS, CHRONIC 05/31/2009   Hypothyroidism 04/09/2009   Hyperlipidemia LDL goal <100 04/09/2009   HTN (hypertension) 04/09/2009   GERD (gastroesophageal reflux disease) 04/09/2009   Sleep apnea 04/09/2009    Annamarie Major) Welles Walthall MPT 11/08/2021, 7:05 PM  [facility] [address] [place], [place], [address] Phone: [phone]   Fax:  [phone]  Name: Samuel Goodwin MRN: [id number] Date of Birth: 1961-08-31

## 2021-11-11 ENCOUNTER — Ambulatory Visit (HOSPITAL_BASED_OUTPATIENT_CLINIC_OR_DEPARTMENT_OTHER): Payer: No Typology Code available for payment source | Attending: Family Medicine | Admitting: Physical Therapy

## 2021-11-11 ENCOUNTER — Other Ambulatory Visit: Payer: Self-pay

## 2021-11-11 DIAGNOSIS — R262 Difficulty in walking, not elsewhere classified: Secondary | ICD-10-CM | POA: Insufficient documentation

## 2021-11-11 DIAGNOSIS — M25551 Pain in right hip: Secondary | ICD-10-CM | POA: Insufficient documentation

## 2021-11-11 DIAGNOSIS — M6281 Muscle weakness (generalized): Secondary | ICD-10-CM | POA: Insufficient documentation

## 2021-11-15 ENCOUNTER — Encounter (HOSPITAL_BASED_OUTPATIENT_CLINIC_OR_DEPARTMENT_OTHER): Payer: Self-pay | Admitting: Physical Therapy

## 2021-11-15 ENCOUNTER — Other Ambulatory Visit: Payer: Self-pay

## 2021-11-15 ENCOUNTER — Ambulatory Visit (HOSPITAL_BASED_OUTPATIENT_CLINIC_OR_DEPARTMENT_OTHER): Payer: No Typology Code available for payment source | Admitting: Physical Therapy

## 2021-11-15 DIAGNOSIS — M25551 Pain in right hip: Secondary | ICD-10-CM | POA: Diagnosis present

## 2021-11-15 DIAGNOSIS — R262 Difficulty in walking, not elsewhere classified: Secondary | ICD-10-CM | POA: Diagnosis present

## 2021-11-15 DIAGNOSIS — M6281 Muscle weakness (generalized): Secondary | ICD-10-CM | POA: Diagnosis present

## 2021-11-15 NOTE — Therapy (Signed)
[facility] [address] [place], [place], [address] Phone: [phone]   Fax:  [phone]  Physical Therapy Treatment Progress Note Reporting Period 09/02/21 to 11/15/21  See note below for Objective Data and Assessment of Progress/Goals.     Patient Details  Name: Keywon Mestre MRN: [id number] Date of Birth: [birth year] Referring Provider (PT): Tawnya Crook, MD   Encounter Date: 11/15/2021   PT End of Session - 11/15/21 1457     Visit Number 20    Number of Visits 30    Date for PT Re-Evaluation 01/12/22    Authorization Type [facility]    Authorization Time Period 09/16/21-01/14/22    PT Start Time 1450    PT Stop Time 4166    PT Time Calculation (min) 45 min    Equipment Utilized During Treatment Other (comment)    Activity Tolerance Patient tolerated treatment well    Behavior During Therapy WFL for tasks assessed/performed             Past Medical History:  Diagnosis Date   Alcohol abuse    Sobriety since 1990   Anxiety    Arthritis    oa   Benign positional vertigo    Depression    Eardrum trauma    30 years ago/right ear   GERD (gastroesophageal reflux disease)    Headache    sinus   Hepatitis C 1994   hep c - ? 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   COLONOSCOPY  03/2013   HIP CLOSED REDUCTION Right 02/05/2016   Procedure: CLOSED MANIPULATION HIP;  Surgeon: Latanya Maudlin, MD;  [facility]: [facility];  Service: Orthopedics;  Laterality: Right;   knee arthroscopic Bilateral    LARYNGOSCOPY  08/2009   Dr.[name]   NASAL FRACTURE SURGERY     age 45   right knee arthroscopic knee surgery  12 yrs ago   dr Theda Sers   THROAT SURGERY  aug, sept, Nov 16 2016   vocal cord  laser sugery X 3, Dr [name] , [facility]   TOTAL HIP ARTHROPLASTY Right 02/01/2016   Procedure: RIGHT TOTAL HIP ARTHROPLASTY ANTERIOR APPROACH;  Surgeon: Paralee Cancel, MD;  [facility];  Service: Orthopedics;  Laterality: Right;   TOTAL HIP ARTHROPLASTY Left 12/19/2016   Procedure: LEFT TOTAL HIP ARTHROPLASTY ANTERIOR APPROACH;  Surgeon: Paralee Cancel, MD;  [facility];  Service: Orthopedics;  Laterality: Left;   TOTAL HIP REVISION Right 11/11/2018   Procedure: right total hip arthroplasty revision, femoral stem;  Surgeon: Paralee Cancel, MD;  [facility];  Service: Orthopedics;  Laterality:  Right;  60mn   TOTAL KNEE ARTHROPLASTY Left 11/27/2014   Procedure: LEFT TOTAL KNEE ARTHROPLASTY;  Surgeon: SAugustin Schooling MD;  Location: [facility]  Service: Orthopedics;  Laterality: Left;   TOTAL KNEE ARTHROPLASTY Right 12/16/2019   Procedure: TOTAL KNEE ARTHROPLASTY;  Surgeon: OParalee Cancel MD;  [facility];  Service: Orthopedics;  Laterality: Right;  70 mins   TRANSTHORACIC ECHOCARDIOGRAM  11/08/2005   EF 68%, normal LV systolic function   UPPER GASTROINTESTINAL ENDOSCOPY  2010   Negative, Dr.[name]    There were no vitals filed for this visit.   Subjective Assessment - 11/15/21 1456     Subjective My foot seesm to be better but My neck is soar today, I can't seem to win"    Pain Score 7     Pain Location Back    Pain Orientation Left    Pain  Descriptors / Indicators Aching    Pain Type Chronic pain    Pain Onset More than a month ago    Pain Frequency Intermittent                                             PT Long Term Goals - 11/15/21 1551       PT LONG TERM GOAL #2   Baseline Pt reports times when he is able to "walk normal" Right foot pain improved. Pt is at max potentential.    Status Achieved      PT LONG TERM GOAL #3   Baseline Pt completing upper level core strengtheing Pilates exercises with improving execution with each session.  He also states his leg strength and core have significantly improved since therapy began    Status Achieved            Pt seen for aquatic therapy today.  Treatment took place in water 3.25-4.8 ft [facility] Temp of water was 91.  Pt entered/exited the pool via stairs step to pattern independently with bilat rail.   Warm up Water walking forward, back and side stepping increasing step length and speed, cuing for UE placement to increase resistance x 4 lengths of pool   Standing Stretching; hamstring and gastroc "runners stretch",  IT band and hip extensors  3 x 30 sec -LB stretch in pike position holding to handrail. Hip hiking right and left for opposite stretch -Tall kneeling quad stretch 3 x 30 sec    Water Pilates for core strength and balance Using yellow noodle: Began with: Reverse planks Broke down into sgments. Begain with sitting balance on noodle progressed to extending noodle under buttock and balancing then moved to plank. Pt with improved execution. -verbal and demonstration for proper execution. Complete 4-5 reps holding up to 30 sec. Modified plank x 3 for 30 secs -Lat pulls 3 x 10 -Planks x 3 with up to 30 sec hold Planks with "swimming" x 5  Pt requires buoyancy for support and to offload joints with strengthening exercises. Viscosity of the water is needed for resistance of strengthening;  water current perturbations provides challenge to standing balance unsupported, requiring increased core activation.       Plan - 11/15/21 1509     Clinical Impression Statement Pt with improved execution with reverse plank.  Began with today before fatigued. Pt also properly executes plank on first try. Improved core control/  strength demonstrated with completion today. 2 LTGs met with imrpoved strength and decreased antalgic gait with decreased right foot pain. Complains of cervical and Right hip/LB discomfort. Modified a few of the exercises for toleration.  Pt in agreement with pending DC.  Pt is compliant with HEP. VU of importance of managing dysfunction to maximze ability. Will laminate HEP and ensure proper completion and understanding.    Stability/Clinical Decision Making Evolving/Moderate complexity    Clinical Decision Making Moderate    Rehab Potential Good    PT Frequency 1x / week    PT Duration 12 weeks    PT Treatment/Interventions ADLs/Self Care Home Management;Cryotherapy;Functional mobility training;Stair training;Gait training;Aquatic Therapy;Therapeutic activities;Therapeutic exercise;Balance training;Neuromuscular re-education;Patient/family education;Passive range of motion;Scar mobilization;Manual techniques;Taping    PT Next Visit Plan HEP instruction    PT Home Exercise Plan [id number] Added new aquatics, laminated             Patient will benefit from skilled therapeutic intervention in order to improve the following deficits and impairments:  Abnormal gait, Impaired sensation, Decreased activity tolerance, Decreased strength, Pain, Increased muscle spasms, Difficulty walking, Improper body mechanics, Decreased range of motion, Postural dysfunction  Visit Diagnosis: Pain in right hip  Difficulty in walking, not elsewhere classified  Muscle weakness (generalized)     Problem List Patient Active Problem List   Diagnosis Date Noted   Pneumonia due to  COVID-19 virus 10/08/2020   Upper airway cough syndrome 10/08/2020   S/P right TKA 12/16/2019   Status post total knee replacement, right 12/16/2019   Hip fracture ([place]) 11/13/2018   Chronic anemia 11/06/2018   Reactive airway disease 11/06/2018   Femoral fracture ([place]) 11/05/2018   Morbid obesity ([place]) 12/20/2016   S/P left THA, AA 12/19/2016   Chest pain 08/15/2016   S/P right TH revision 02/09/2016   Hip instability 02/05/2016   H/O total hip arthroplasty 02/05/2016   S/P right THA, AA 02/01/2016   S/P knee replacement 11/27/2014   Unspecified viral hepatitis C without hepatic coma 08/28/2014   DYSPHONIA, CHRONIC 01/06/2010   NONSPECIFIC ABNORMAL ELECTROCARDIOGRAM 01/06/2010   HOARSENESS, CHRONIC 05/31/2009   Hypothyroidism 04/09/2009   Hyperlipidemia LDL goal <100 04/09/2009   HTN (hypertension) 04/09/2009   GERD (gastroesophageal reflux disease) 04/09/2009   Sleep apnea 04/09/2009    Annamarie Major) Breezy Hertenstein MPT 11/15/2021, 3:56 PM  [facility] [address] [place], [place], [address] Phone: [phone]   Fax:  [phone]  Name: Odes Lolli MRN: [id number] Date of Birth: Dec 14, 1960

## 2021-11-18 ENCOUNTER — Ambulatory Visit (HOSPITAL_BASED_OUTPATIENT_CLINIC_OR_DEPARTMENT_OTHER): Payer: Self-pay | Admitting: Physical Therapy

## 2021-11-22 ENCOUNTER — Ambulatory Visit (HOSPITAL_BASED_OUTPATIENT_CLINIC_OR_DEPARTMENT_OTHER): Payer: No Typology Code available for payment source | Admitting: Physical Therapy

## 2021-11-25 ENCOUNTER — Ambulatory Visit (HOSPITAL_BASED_OUTPATIENT_CLINIC_OR_DEPARTMENT_OTHER): Payer: Self-pay | Admitting: Physical Therapy

## 2021-11-29 ENCOUNTER — Ambulatory Visit (HOSPITAL_BASED_OUTPATIENT_CLINIC_OR_DEPARTMENT_OTHER): Payer: Self-pay | Admitting: Physical Therapy

## 2021-12-09 ENCOUNTER — Ambulatory Visit (HOSPITAL_BASED_OUTPATIENT_CLINIC_OR_DEPARTMENT_OTHER): Payer: Self-pay | Admitting: Physical Therapy

## 2021-12-09 ENCOUNTER — Encounter (HOSPITAL_BASED_OUTPATIENT_CLINIC_OR_DEPARTMENT_OTHER): Payer: Self-pay

## 2021-12-13 ENCOUNTER — Ambulatory Visit (HOSPITAL_BASED_OUTPATIENT_CLINIC_OR_DEPARTMENT_OTHER): Payer: No Typology Code available for payment source | Admitting: Physical Therapy

## 2021-12-20 ENCOUNTER — Other Ambulatory Visit (HOSPITAL_BASED_OUTPATIENT_CLINIC_OR_DEPARTMENT_OTHER): Payer: Self-pay

## 2021-12-21 ENCOUNTER — Ambulatory Visit (INDEPENDENT_AMBULATORY_CARE_PROVIDER_SITE_OTHER): Payer: No Typology Code available for payment source | Admitting: Diagnostic Neuroimaging

## 2021-12-21 ENCOUNTER — Encounter: Payer: Self-pay | Admitting: Diagnostic Neuroimaging

## 2021-12-21 ENCOUNTER — Other Ambulatory Visit (HOSPITAL_BASED_OUTPATIENT_CLINIC_OR_DEPARTMENT_OTHER): Payer: Self-pay

## 2021-12-21 VITALS — BP 134/80 | HR 66 | Ht 72.0 in | Wt 294.0 lb

## 2021-12-21 DIAGNOSIS — R202 Paresthesia of skin: Secondary | ICD-10-CM | POA: Diagnosis not present

## 2021-12-21 DIAGNOSIS — R531 Weakness: Secondary | ICD-10-CM | POA: Diagnosis not present

## 2021-12-21 DIAGNOSIS — R200 Anesthesia of skin: Secondary | ICD-10-CM | POA: Diagnosis not present

## 2021-12-21 DIAGNOSIS — G894 Chronic pain syndrome: Secondary | ICD-10-CM | POA: Diagnosis not present

## 2021-12-21 NOTE — Patient Instructions (Signed)
°  RIGHT HIP FLEXOR WEAKNESS (likely limited by right hip pain, deconditioning, prior surgeries) - continue PT exercises  BILATERAL LEG NUMBNESS / HEAVINESS - check neuropathy, myopathy labs  CHRONIC PAIN SYNDROME / POST-TRAUMATIC - follow up with Dr. Nelva Bush; consider gabapentin, pregabalin, duloxetine
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
## 2021-12-22 ENCOUNTER — Other Ambulatory Visit (HOSPITAL_BASED_OUTPATIENT_CLINIC_OR_DEPARTMENT_OTHER): Payer: Self-pay

## 2021-12-22 MED ORDER — FLUTICASONE PROPIONATE 50 MCG/ACT NA SUSP: NASAL | 6 refills | Status: DC

## 2021-12-22 MED ORDER — EZETIMIBE 10 MG PO TABS: ORAL_TABLET | ORAL | 0 refills | Status: DC

## 2021-12-22 MED ORDER — PANTOPRAZOLE SODIUM 40 MG PO TBEC: DELAYED_RELEASE_TABLET | ORAL | 0 refills | Status: DC

## 2021-12-22 MED ORDER — FLUOROURACIL 5 % EX CREA: TOPICAL_CREAM | CUTANEOUS | 0 refills | Status: DC

## 2021-12-22 MED ORDER — AMOXICILLIN 500 MG PO CAPS: ORAL_CAPSULE | ORAL | 99 refills | Status: DC

## 2021-12-27 ENCOUNTER — Ambulatory Visit (HOSPITAL_BASED_OUTPATIENT_CLINIC_OR_DEPARTMENT_OTHER): Payer: No Typology Code available for payment source | Admitting: Internal Medicine

## 2021-12-27 LAB — COMPREHENSIVE METABOLIC PANEL
ALT: 19 IU/L (ref 0–44)
AST: 16 IU/L (ref 0–40)
Albumin/Globulin Ratio: 1.8 (ref 1.2–2.2)
Albumin: 4.4 g/dL (ref 3.8–4.9)
Alkaline Phosphatase: 78 IU/L (ref 44–121)
BUN/Creatinine Ratio: 18 (ref 10–24)
BUN: 14 mg/dL (ref 8–27)
Bilirubin Total: 0.3 mg/dL (ref 0.0–1.2)
CO2: 25 mmol/L (ref 20–29)
Calcium: 9.3 mg/dL (ref 8.6–10.2)
Chloride: 100 mmol/L (ref 96–106)
Creatinine, Ser: 0.77 mg/dL (ref 0.76–1.27)
Globulin, Total: 2.5 g/dL (ref 1.5–4.5)
Glucose: 91 mg/dL (ref 70–99)
Potassium: 4.1 mmol/L (ref 3.5–5.2)
Sodium: 137 mmol/L (ref 134–144)
Total Protein: 6.9 g/dL (ref 6.0–8.5)
eGFR: 102 mL/min/{1.73_m2} (ref >59)

## 2021-12-27 LAB — CBC WITH DIFFERENTIAL/PLATELET
Basophils Absolute: 0 10*3/uL (ref 0.0–0.2)
Basos: 1 %
EOS (ABSOLUTE): 0.1 10*3/uL (ref 0.0–0.4)
Eos: 1 %
Hematocrit: 41.6 % (ref 37.5–51.0)
Hemoglobin: 13.9 g/dL (ref 13.0–17.7)
Immature Grans (Abs): 0.1 10*3/uL (ref 0.0–0.1)
Immature Granulocytes: 1 %
Lymphocytes Absolute: 1.3 10*3/uL (ref 0.7–3.1)
Lymphs: 19 %
MCH: 29.4 pg (ref 26.6–33.0)
MCHC: 33.4 g/dL (ref 31.5–35.7)
MCV: 88 fL (ref 79–97)
Monocytes Absolute: 0.5 10*3/uL (ref 0.1–0.9)
Monocytes: 7 %
Neutrophils Absolute: 5 10*3/uL (ref 1.4–7.0)
Neutrophils: 71 %
Platelets: 278 10*3/uL (ref 150–450)
RBC: 4.72 x10E6/uL (ref 4.14–5.80)
RDW: 13.2 % (ref 11.6–15.4)
WBC: 6.9 10*3/uL (ref 3.4–10.8)

## 2021-12-27 LAB — ALDOLASE: Aldolase: 4.1 U/L (ref 3.3–10.3)

## 2021-12-27 LAB — HEMOGLOBIN A1C
Est. average glucose Bld gHb Est-mCnc: 100 mg/dL
Hgb A1c MFr Bld: 5.1 % (ref 4.8–5.6)

## 2021-12-27 LAB — MULTIPLE MYELOMA PANEL, SERUM
Albumin SerPl Elph-Mcnc: 3.6 g/dL (ref 2.9–4.4)
Albumin/Glob SerPl: 1.1 (ref 0.7–1.7)
Alpha 1: 0.2 g/dL (ref 0.0–0.4)
Alpha2 Glob SerPl Elph-Mcnc: 0.8 g/dL (ref 0.4–1.0)
B-Globulin SerPl Elph-Mcnc: 1.2 g/dL (ref 0.7–1.3)
Gamma Glob SerPl Elph-Mcnc: 1.1 g/dL (ref 0.4–1.8)
Globulin, Total: 3.3 g/dL (ref 2.2–3.9)
IgA/Immunoglobulin A, Serum: 300 mg/dL (ref 90–386)
IgG (Immunoglobin G), Serum: 1002 mg/dL (ref 603–1613)
IgM (Immunoglobulin M), Srm: 75 mg/dL (ref 20–172)

## 2021-12-27 LAB — CK: Total CK: 117 U/L (ref 41–331)

## 2021-12-27 LAB — SJOGREN'S SYNDROME ANTIBODS(SSA + SSB)
ENA SSA (RO) Ab: <0.2 AI (ref 0.0–0.9)
ENA SSB (LA) Ab: <0.2 AI (ref 0.0–0.9)

## 2021-12-27 LAB — VITAMIN B12: Vitamin B-12: 660 pg/mL (ref 232–1245)

## 2021-12-27 LAB — ANA W/REFLEX: ANA Titer 1: NEGATIVE

## 2021-12-27 LAB — TSH: TSH: 0.989 u[IU]/mL (ref 0.450–4.500)

## 2022-01-03 ENCOUNTER — Other Ambulatory Visit (HOSPITAL_BASED_OUTPATIENT_CLINIC_OR_DEPARTMENT_OTHER): Payer: Self-pay

## 2022-01-04 ENCOUNTER — Other Ambulatory Visit (HOSPITAL_BASED_OUTPATIENT_CLINIC_OR_DEPARTMENT_OTHER): Payer: Self-pay

## 2022-01-04 ENCOUNTER — Encounter (HOSPITAL_BASED_OUTPATIENT_CLINIC_OR_DEPARTMENT_OTHER): Payer: Self-pay | Admitting: Pharmacist

## 2022-01-04 MED ORDER — DICLOFENAC SODIUM 1 % EX GEL
CUTANEOUS | 1 refills | Status: DC
  Filled 2022-01-04: qty 500, 30d supply, fill #0

## 2022-01-05 ENCOUNTER — Other Ambulatory Visit (HOSPITAL_BASED_OUTPATIENT_CLINIC_OR_DEPARTMENT_OTHER): Payer: Self-pay

## 2022-01-05 MED ORDER — DICLOFENAC SODIUM 1 % EX GEL
CUTANEOUS | 2 refills | Status: DC
  Filled 2022-01-26 (×2): qty 500, 30d supply, fill #0
  Filled 2022-04-11 – 2022-04-25 (×2): qty 500, 30d supply, fill #1

## 2022-01-19 ENCOUNTER — Other Ambulatory Visit (HOSPITAL_BASED_OUTPATIENT_CLINIC_OR_DEPARTMENT_OTHER): Payer: Self-pay

## 2022-01-19 MED ORDER — PREDNISONE 5 MG PO TABS
ORAL_TABLET | ORAL | 0 refills | Status: DC
  Filled 2022-01-19: qty 48, 12d supply, fill #0

## 2022-01-19 MED ORDER — METHOCARBAMOL 500 MG PO TABS
ORAL_TABLET | ORAL | 1 refills | Status: DC
  Filled 2022-01-19: qty 60, 15d supply, fill #0

## 2022-01-20 ENCOUNTER — Encounter (HOSPITAL_BASED_OUTPATIENT_CLINIC_OR_DEPARTMENT_OTHER): Payer: Self-pay | Admitting: Family Medicine

## 2022-01-20 ENCOUNTER — Other Ambulatory Visit (HOSPITAL_BASED_OUTPATIENT_CLINIC_OR_DEPARTMENT_OTHER): Payer: Self-pay

## 2022-01-20 ENCOUNTER — Ambulatory Visit (INDEPENDENT_AMBULATORY_CARE_PROVIDER_SITE_OTHER): Payer: No Typology Code available for payment source | Admitting: Family Medicine

## 2022-01-20 ENCOUNTER — Other Ambulatory Visit: Payer: Self-pay

## 2022-01-20 VITALS — BP 180/122 | HR 88 | Ht 72.0 in | Wt 296.0 lb

## 2022-01-20 DIAGNOSIS — E039 Hypothyroidism, unspecified: Secondary | ICD-10-CM | POA: Diagnosis not present

## 2022-01-20 DIAGNOSIS — G4733 Obstructive sleep apnea (adult) (pediatric): Secondary | ICD-10-CM

## 2022-01-20 DIAGNOSIS — Z0000 Encounter for general adult medical examination without abnormal findings: Secondary | ICD-10-CM

## 2022-01-20 DIAGNOSIS — I10 Essential (primary) hypertension: Secondary | ICD-10-CM

## 2022-01-20 MED ORDER — PANTOPRAZOLE SODIUM 40 MG PO TBEC
DELAYED_RELEASE_TABLET | ORAL | 1 refills | Status: DC
  Filled 2022-01-20: qty 90, 90d supply, fill #0
  Filled 2022-04-11: qty 90, 90d supply, fill #1

## 2022-01-20 NOTE — Assessment & Plan Note (Signed)
Blood pressure elevated in office today, likely secondary to current steroid use Reports good control on home readings outside of steroid use Can continue with current medications of hydrochlorothiazide and losartan Recommend lifestyle modifications including DASH diet, working towards healthy, gradual weight loss Recommend intermittent monitoring at home and if blood pressure continues to remain elevated for patient to schedule sooner follow-up appointment to discuss

## 2022-01-20 NOTE — Assessment & Plan Note (Signed)
Recent TSH completed with neurology, was within normal range We will continue with current dosing of levothyroxine

## 2022-01-20 NOTE — Progress Notes (Signed)
New Patient Office Visit  Subjective:  Patient ID: Tayvon Culley, male    DOB: [birth year]  Age: 61 y.o. MRN: [id number]  CC:  Chief Complaint  Patient presents with   Establish Care    PCP with the [facility]. Patient has no acute concerns or complaints today   Medication Refill    Patient would like rx of pantoprazole renewed.     HPI Jerimey Burridge is a 61 yo male presenting to establish in clinic.  He has current concerns as outlined above.  Past medical history significant for hypertension, hypothyroidism, hyperlipidemia, sleep apnea.  Patient has also had numerous surgeries in the past including right hip replacement with hardware failure and subsequent revision.  Hypothyroidism: Currently manages on levothyroxine, total of 250 mcg daily.  Did have recent TSH completed which was within normal range.   Hypertension: Current medications include hydrochlorothiazide 25 mg and losartan 50 mg.  Does check blood pressure at home, reports that typically blood pressure will be 130/80.  Feels that elevation in office today is related to use of prednisone for right hip pain.  Hyperlipidemia: Currently manages with lifestyle modifications as he has not tolerated statin therapy in the past.  Sleep apnea: Currently does use CPAP with good relief of symptoms.  Does follow with orthopedic surgeon at [facility] for various musculoskeletal issues including back pain, hip pain, knee pain Patient also follows with Dr. Gwenlyn Found of cardiology in regards to his hypertension, hyperlipidemia Patient recently saw neurology, reportedly at the request of Dr. Jeralyn Ruths orthopedic surgeon for further evaluation of chronic pain, lower extremity weakness  Patient is originally from [place], [place].  He has been living in the [place] area for 30 years.  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  Patient is a Retail buyer.  He does like to keep himself busy and remain active, his various medical issues and musculoskeletal issues do notably impact this at times  Past Medical History:  Diagnosis Date   Alcohol abuse    Sobriety since 1990   Anxiety    Arthritis    oa   Benign positional vertigo    Depression    Eardrum trauma    30 years ago/right ear   GERD (gastroesophageal reflux disease)    Headache    sinus   Hepatitis C 1994   hep c - ? 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   COLONOSCOPY  03/2013   HIP CLOSED REDUCTION Right 02/05/2016  Procedure: CLOSED MANIPULATION HIP;  Surgeon: Latanya Maudlin, MD;  [facility];  Service: Orthopedics;  Laterality: Right;   knee arthroscopic Bilateral    LARYNGOSCOPY  08/2009   Dr.[name]   NASAL FRACTURE SURGERY     age 35   right knee arthroscopic knee surgery  12 yrs ago   dr Theda Sers   THROAT SURGERY  aug, sept, Nov 16 2016   vocal cord  laser sugery X 3, Dr [name] , [facility]   TOTAL HIP ARTHROPLASTY Right 02/01/2016   Procedure: RIGHT TOTAL HIP ARTHROPLASTY ANTERIOR APPROACH;  Surgeon: Paralee Cancel, MD;  [facility];   Service: Orthopedics;  Laterality: Right;   TOTAL HIP ARTHROPLASTY Left 12/19/2016   Procedure: LEFT TOTAL HIP ARTHROPLASTY ANTERIOR APPROACH;  Surgeon: Paralee Cancel, MD;  [facility];  Service: Orthopedics;  Laterality: Left;   TOTAL HIP REVISION Right 11/11/2018   Procedure: right total hip arthroplasty revision, femoral stem;  Surgeon: Paralee Cancel, MD;  [facility];  Service: Orthopedics;  Laterality: Right;  10min   TOTAL KNEE ARTHROPLASTY Left 11/27/2014   Procedure: LEFT TOTAL KNEE ARTHROPLASTY;  Surgeon: Augustin Schooling, MD;  Location: [facility];  Service: Orthopedics;  Laterality: Left;   TOTAL KNEE ARTHROPLASTY Right 12/16/2019   Procedure: TOTAL KNEE ARTHROPLASTY;  Surgeon: Paralee Cancel, MD;  [facility];  Service: Orthopedics;  Laterality: Right;  70 mins   TRANSTHORACIC ECHOCARDIOGRAM  11/08/2005   EF 68%, normal LV systolic function   UPPER GASTROINTESTINAL ENDOSCOPY  2010   Negative, Dr.[name]    Family History  Problem Relation Age of Onset   Heart failure Mother    Hypertension Mother    Thyroid disease Mother    Cirrhosis Father    Diabetes Father    Stroke Father    Heart attack Brother 59   Aneurysm Maternal Aunt         AAA   Coronary artery disease Maternal Uncle        MI late 38s    Social History   Socioeconomic History   Marital status: Married    Spouse name: Levada Dy   Number of children: 3   Years of education: 12   Highest education level: Associate degree: occupational, Hotel manager, or vocational program  Occupational History   Not on file  Tobacco Use   Smoking status: Never   Smokeless tobacco: Never   Tobacco comments:    Quit at age 89  Vaping Use   Vaping Use: Never used  Substance and Sexual Activity   Alcohol use: No    Comment: alcoholism quit 30 years ago    Drug use: No   Sexual activity: Yes  Other Topics Concern   Not on file  Social History Narrative   Lives with wife   No caffeine   Social Determinants of  Health   Financial Resource Strain: Not on file  Food Insecurity: Not on file  Transportation Needs: Not on file  Physical Activity: Not on file  Stress: Not on file  Social Connections: Not on file  Intimate Partner Violence: Not on file    Objective:   Today's Vitals: BP (!) 180/122    Pulse 88    Ht 6' (1.829 m)    Wt 296 lb (134.3 kg)    SpO2 98%    BMI 40.14 kg/m   Physical Exam  61 year old male in no acute distress Cardiovascular exam with regular rate and rhythm, no murmur appreciated Lungs clear to auscultation  bilaterally  Assessment & Plan:   Problem List Items Addressed This Visit       Cardiovascular and Mediastinum   HTN (hypertension) - Primary    Blood pressure elevated in office today, likely secondary to current steroid use Reports good control on home readings outside of steroid use Can continue with current medications of hydrochlorothiazide and losartan Recommend lifestyle modifications including DASH diet, working towards healthy, gradual weight loss Recommend intermittent monitoring at home and if blood pressure continues to remain elevated for patient to schedule sooner follow-up appointment to discuss      Relevant Orders   CBC with Differential/Platelet   Comprehensive metabolic panel   Hemoglobin A1c   Lipid panel     Respiratory   Sleep apnea    Utilizing CPAP, encourage patient to do so for adequate treatment of sleep apnea        Endocrine   Hypothyroidism    Recent TSH completed with neurology, was within normal range We will continue with current dosing of levothyroxine      Relevant Orders   TSH   Other Visit Diagnoses     Well adult exam       Relevant Orders   CBC with Differential/Platelet   Comprehensive metabolic panel   Hemoglobin A1c   Lipid panel       Outpatient Encounter Medications as of 01/20/2022  Medication Sig   amoxicillin (AMOXIL) 500 MG capsule Take 4 capsules by mouth 1 hour prior to dental  appointment.   Ascorbic Acid (VITAMIN C) 1000 MG tablet Take 1,000 mg by mouth daily.   aspirin 81 MG chewable tablet    b complex vitamins tablet Take 2 tablets by mouth daily.    carboxymethylcellulose (REFRESH PLUS) 0.5 % SOLN INSTILL 1 DROP IN BOTH EYES FOUR TIMES A DAY   celecoxib (CELEBREX) 200 MG capsule Take 1 capsule by mouth daily as needed.   diclofenac Sodium (VOLTAREN) 1 % GEL APPLY TO KNEE AND HIPS AS NEEDED   fluticasone (FLONASE) 50 MCG/ACT nasal spray Place 2 spray into both nostrils once daily.   hydrochlorothiazide (HYDRODIURIL) 25 MG tablet Take 1 tablet (25 mg total) by mouth daily.   levothyroxine (SYNTHROID) 125 MCG tablet Take 250 mcg by mouth daily before breakfast.   losartan (COZAAR) 100 MG tablet TAKE ONE-HALF TABLET BY MOUTH ONCE A DAY   methocarbamol (ROBAXIN) 500 MG tablet 1 po q 6 hrs prn spasms   Multiple Vitamin (MULTIVITAMIN) tablet Take 1 tablet by mouth every morning.   omeprazole (PRILOSEC) 20 MG capsule Take 1 capsule by mouth 2 (two) times daily.   predniSONE (DELTASONE) 5 MG tablet Take as directed for 12 days   [DISCONTINUED] pantoprazole (PROTONIX) 40 MG tablet Take 1 tablet by mouth 30 minutes before first and last meals of the day.   pantoprazole (PROTONIX) 40 MG tablet Take 1 tablet by mouth 30 minutes before first and last meals of the day.   [DISCONTINUED] albuterol (VENTOLIN HFA) 108 (90 Base) MCG/ACT inhaler Inhale 2 puffs into the lungs every 6 (six) hours as needed. (Patient not taking: Reported on 12/21/2021)   [DISCONTINUED] celecoxib (CELEBREX) 200 MG capsule Take 1 capsule (200 mg total) by mouth 2 (two) times daily. (Patient not taking: Reported on 12/21/2021)   [DISCONTINUED] diclofenac Sodium (VOLTAREN) 1 % GEL diclofenac 1 % topical gel  APPLY TO KNEE/HIPS AS NEEDED (ALLOWING 1 TUBE PER WEEK) (Patient not taking: Reported on 01/20/2022)   [DISCONTINUED] diclofenac Sodium (VOLTAREN) 1 %  GEL APPLY TO KNEE AND HIPS AS NEEDED (Patient not  taking: Reported on 01/20/2022)   [DISCONTINUED] Evolocumab (REPATHA SURECLICK) 740 MG/ML SOAJ Inject 140 mg into the skin every 14 (fourteen) days. (Patient not taking: Reported on 12/21/2021)   [DISCONTINUED] ezetimibe (ZETIA) 10 MG tablet Take 1 tablet (10 mg total) by mouth daily. (Patient not taking: Reported on 08/05/2021)   [DISCONTINUED] ezetimibe (ZETIA) 10 MG tablet Take 1 tablet by mouth once daily. (Patient not taking: Reported on 01/20/2022)   [DISCONTINUED] fluorouracil (EFUDEX) 5 % cream Apply to affected areas on the skin 2 times a day for 2 weeks. (Patient not taking: Reported on 01/20/2022)   [DISCONTINUED] fluticasone (FLONASE) 50 MCG/ACT nasal spray Place 1 spray into both nostrils daily as needed for allergies.  (Patient not taking: Reported on 01/20/2022)   [DISCONTINUED] traMADol (ULTRAM) 50 MG tablet tramadol 50 mg tablet (Patient not taking: Reported on 12/21/2021)   No facility-administered encounter medications on file as of 01/20/2022.   Spent 50 minutes on this patient encounter, including preparation, chart review, face-to-face counseling with patient and coordination of care, and documentation of encounter  Follow-up: Return in about 6 months (around 07/20/2022).  Plan for follow-up in about 6 months for CPE, nurse visit for labs 1 week prior  Kinsey Karch J De [place], MD

## 2022-01-20 NOTE — Assessment & Plan Note (Signed)
Utilizing CPAP, encourage patient to do so for adequate treatment of sleep apnea

## 2022-01-20 NOTE — Patient Instructions (Signed)
°  Medication Instructions:  Your physician recommends that you continue on your current medications as directed. Please refer to the Current Medication list given to you today. --If you need a refill on any your medications before your next appointment, please call your pharmacy first. If no refills are authorized on file call the office.--  Follow-Up: Your next appointment:   Your physician recommends that you schedule a follow-up appointment in: 46 MONTHS for CPE with Dr. de [place]  You will receive a text message or e-mail with a link to a survey about your care and experience with [place] today! We would greatly appreciate your feedback!   Thanks for letting us be apart of your health journey!!  Primary Care and Sports Medicine   Dr. Arlina Robes [place]   We encourage you to activate your patient portal called "MyChart".  Sign up information is provided on this After Visit Summary.  MyChart is used to connect with patients for Virtual Visits (Telemedicine).  Patients are able to view lab/test results, encounter notes, upcoming appointments, etc.  Non-urgent messages can be sent to your provider as well. To learn more about what you can do with MyChart, please visit --  [email or web address].

## 2022-01-26 ENCOUNTER — Other Ambulatory Visit (HOSPITAL_BASED_OUTPATIENT_CLINIC_OR_DEPARTMENT_OTHER): Payer: Self-pay

## 2022-01-26 MED ORDER — CYCLOBENZAPRINE HCL 10 MG PO TABS
ORAL_TABLET | ORAL | 1 refills | Status: DC
  Filled 2022-01-26: qty 30, 10d supply, fill #0

## 2022-01-27 ENCOUNTER — Other Ambulatory Visit (HOSPITAL_BASED_OUTPATIENT_CLINIC_OR_DEPARTMENT_OTHER): Payer: Self-pay

## 2022-01-29 NOTE — Progress Notes (Unsigned)
Cardiology Clinic Note   Patient Name: Samuel Goodwin Date of Encounter: 01/29/2022  Primary Care Provider:  de [place], Blondell Reveal, MD Primary Cardiologist:  Quay Burow, MD  Patient Profile    Samuel Goodwin 61 year old male presents to the clinic today for an evaluation of his hypertension.  Past Medical History    Past Medical History:  Diagnosis Date   Alcohol abuse    Sobriety since 1990   Anxiety    Arthritis    oa   Benign positional vertigo    Depression    Eardrum trauma    30 years ago/right ear   GERD (gastroesophageal reflux disease)    Headache    sinus   Hepatitis C 1994   hep c - ? 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   COLONOSCOPY  03/2013   HIP CLOSED REDUCTION Right 02/05/2016   Procedure: CLOSED MANIPULATION HIP;  Surgeon: Latanya Maudlin, MD;  [facility];  Service: Orthopedics;  Laterality: Right;   knee arthroscopic Bilateral    LARYNGOSCOPY  08/2009   Dr.[name]   NASAL FRACTURE SURGERY     age 47   right knee arthroscopic knee surgery  12 yrs ago   dr Theda Sers   THROAT SURGERY  aug, sept, Nov 16 2016   vocal cord  laser sugery X 3, Dr [name] , [facility]    TOTAL HIP ARTHROPLASTY Right 02/01/2016   Procedure: RIGHT TOTAL HIP ARTHROPLASTY ANTERIOR APPROACH;  Surgeon: Paralee Cancel, MD;  [facility];  Service: Orthopedics;  Laterality: Right;   TOTAL HIP ARTHROPLASTY Left 12/19/2016   Procedure: LEFT TOTAL HIP ARTHROPLASTY ANTERIOR APPROACH;  Surgeon: Paralee Cancel, MD;  [facility];  Service: Orthopedics;  Laterality: Left;   TOTAL HIP REVISION Right 11/11/2018   Procedure: right total hip arthroplasty revision, femoral stem;  Surgeon: Paralee Cancel, MD;  [facility];  Service: Orthopedics;  Laterality: Right;  62min   TOTAL KNEE ARTHROPLASTY Left 11/27/2014   Procedure: LEFT TOTAL KNEE ARTHROPLASTY;  Surgeon: Augustin Schooling, MD;  Location: [facility];  Service: Orthopedics;  Laterality: Left;   TOTAL KNEE ARTHROPLASTY Right 12/16/2019   Procedure: TOTAL KNEE ARTHROPLASTY;  Surgeon: Paralee Cancel, MD;  [facility];  Service: Orthopedics;  Laterality: Right;  70 mins   TRANSTHORACIC ECHOCARDIOGRAM  11/08/2005   EF 68%, normal LV systolic function   UPPER GASTROINTESTINAL ENDOSCOPY  2010   Negative, Dr.[name]    Allergies  Allergies  Allergen Reactions   Dilaudid [Hydromorphone Hcl] Nausea And Vomiting    "Questionable as to whether it was actually dilaudid  That  made me so severely nauseous and vomit"   Lipitor [Atorvastatin]     myalgia   Pitavastatin     Myalgia    Simvastatin     myalgia   Benazepril Hcl Cough    History of Present Illness    Samuel Goodwin is a PMH of hypertension, OSA, reactive airway disease, GERD, hypothyroidism, hyperlipidemia, chest pain, morbid obesity, right TKA, and left THA.  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  He previously was noted to have a strong family history of heart disease with hypertension and hyperlipidemia.  He also had throat cancer and underwent surgery at [facility].  He underwent nuclear stress test 08/29/2016 which was negative.  He rebroke his right hip and had a long recovery.  He was unable to exercise and gained weight.  He also reported that he needed right total knee replacement and was working with orthopedics.  He took a statin holiday at the recommendation of his PCP and noted significant improvement in his back and thigh symptoms.  He was seen in follow-up by Dr. Gwenlyn Found on 08/05/2021.  During that time he was stable from a cardiac standpoint.  He was trying to lose weight.  He had been dissipating in water aerobics.  He was not using statin therapy due to statin intolerance.  He was referred to Dr. Lysbeth Penner lipid clinic.  He denies chest pain or shortness of breath.  He presents to the clinic today for follow-up evaluation and states***  *** denies chest pain, shortness of breath, lower extremity edema, fatigue, palpitations, melena, hematuria, hemoptysis, diaphoresis, weakness, presyncope, syncope, orthopnea, and PND.   Home Medications    Prior to Admission medications   Medication Sig Start Date End Date Taking? Authorizing Provider  amoxicillin (AMOXIL) 500 MG capsule Take 4 capsules by mouth 1 hour prior to dental appointment. 10/25/21   Particia Nearing, MD  Ascorbic Acid (VITAMIN C) 1000 MG tablet Take 1,000 mg by mouth daily.    [provider]  aspirin 81 MG chewable tablet  11/25/21   [provider]  b complex vitamins tablet Take 2 tablets by mouth daily.     [provider]  carboxymethylcellulose (REFRESH PLUS) 0.5 % SOLN INSTILL 1 DROP IN BOTH EYES FOUR TIMES A DAY 10/06/19   [provider]  celecoxib (CELEBREX) 200 MG capsule Take 1 capsule by mouth daily as needed. 10/04/20   [provider]  cyclobenzaprine (FLEXERIL) 10 MG tablet Take 1 tablet 3 times a day by oral route as needed. 01/26/22     diclofenac Sodium (VOLTAREN) 1 % GEL APPLY TO KNEE AND HIPS AS  NEEDED 01/05/22     fluticasone (FLONASE) 50 MCG/ACT nasal spray Place 2 spray into both nostrils once daily. 04/13/21     hydrochlorothiazide (HYDRODIURIL) 25 MG tablet Take 1 tablet (25 mg total) by mouth daily. 02/01/12   Hendricks Limes, MD  levothyroxine (SYNTHROID) 125 MCG tablet Take 250 mcg by mouth daily before breakfast.    [provider]  losartan (COZAAR) 100 MG tablet TAKE ONE-HALF TABLET BY MOUTH ONCE A DAY 11/25/21   [provider]  methocarbamol (ROBAXIN) 500 MG tablet 1 po q 6 hrs prn spasms 01/19/22     Multiple Vitamin (MULTIVITAMIN) tablet Take 1 tablet by mouth every morning.    [provider]  omeprazole ([place])  20 MG capsule Take 1 capsule by mouth 2 (two) times daily. 11/25/21   [provider]  pantoprazole (PROTONIX) 40 MG tablet Take 1 tablet by mouth 30 minutes before first and last meals of the day. 01/20/22   [place], Blondell Reveal, MD  predniSONE (DELTASONE) 5 MG tablet Take as directed for 12 days 01/19/22       Family History    Family History  Problem Relation Age of Onset   Heart failure Mother    Hypertension Mother    Thyroid disease Mother    Cirrhosis Father    Diabetes Father    Stroke Father    Heart attack Brother 1   Aneurysm Maternal Aunt         AAA   Coronary artery disease Maternal Uncle        MI late 75s   He indicated that his mother is deceased. He indicated that his father is deceased. He indicated that his brother is alive. He indicated that the status of his maternal aunt is unknown. He indicated that the status of his maternal uncle is unknown.  Social History    Social History   Socioeconomic History   Marital status: Married    Spouse name: Levada Dy   Number of children: 3   Years of education: 12   Highest education level: Associate degree: occupational, Hotel manager, or vocational program  Occupational History   Not on file  Tobacco Use   Smoking status: Never   Smokeless tobacco: Never    Tobacco comments:    Quit at age 41  Vaping Use   Vaping Use: Never used  Substance and Sexual Activity   Alcohol use: No    Comment: alcoholism quit 30 years ago    Drug use: No   Sexual activity: Yes  Other Topics Concern   Not on file  Social History Narrative   Lives with wife   No caffeine   Social Determinants of Health   Financial Resource Strain: Not on file  Food Insecurity: Not on file  Transportation Needs: Not on file  Physical Activity: Not on file  Stress: Not on file  Social Connections: Not on file  Intimate Partner Violence: Not on file     Review of Systems    General:  No chills, fever, night sweats or weight changes.  Cardiovascular:  No chest pain, dyspnea on exertion, edema, orthopnea, palpitations, paroxysmal nocturnal dyspnea. Dermatological: No rash, lesions/masses Respiratory: No cough, dyspnea Urologic: No hematuria, dysuria Abdominal:   No nausea, vomiting, diarrhea, bright red blood per rectum, melena, or hematemesis Neurologic:  No visual changes, wkns, changes in mental status. All other systems reviewed and are otherwise negative except as noted above.  Physical Exam    VS:  There were no vitals taken for this visit. , BMI There is no height or weight on file to calculate BMI. GEN: Well nourished, well developed, in no acute distress. HEENT: normal. Neck: Supple, no JVD, carotid bruits, or masses. Cardiac: RRR, no murmurs, rubs, or gallops. No clubbing, cyanosis, edema.  Radials/DP/PT 2+ and equal bilaterally.  Respiratory:  Respirations regular and unlabored, clear to auscultation bilaterally. GI: Soft, nontender, nondistended, BS + x 4. MS: no deformity or atrophy. Skin: warm and dry, no rash. Neuro:  Strength and sensation are intact. Psych: Normal affect.  Accessory Clinical Findings    Recent Labs: 12/21/2021: ALT 19; BUN 14; Creatinine, Ser 0.77; Hemoglobin 13.9; Platelets 278; Potassium 4.1; Sodium 137; TSH 0.989  Recent Lipid Panel    Component Value Date/Time   CHOL 207 (H) 08/03/2021 0905   TRIG 117 08/03/2021 0905   HDL 43 08/03/2021 0905   CHOLHDL 4.8 08/03/2021 0905   CHOLHDL 3.2 11/03/2014 0800   VLDL 19 11/03/2014 0800   LDLCALC 143 (H) 08/03/2021 0905    ECG personally reviewed by me today- *** - No acute changes  Nuclear stress test 08/30/2016  The left ventricular ejection fraction is normal (55-65%). Nuclear stress EF by computer estimation : 48%. Visually the EF is much better and is likely to be closer to 65%. There was no ST segment deviation noted during stress. The study is normal. This is a low risk study.    Assessment & Plan   1.  Essential hypertension-BP today***.  Well-controlled at home. Continue losartan, HCTZ Heart healthy low-sodium diet-salty 6 given Increase physical activity as tolerated  Hyperlipidemia-LDL 143.  Statin intolerant. Follow-up with lipid clinic. Heart healthy low-sodium high-fiber diet Increase physical activity as tolerated  OSA-reports compliance with CPAP.  Wakes up well rested. Continue CPAP use  Morbid obesity-continues to lose weight.  Continues to be physically active doing water aerobics and eating a reduced calorie diet.  Working with healthy weight and wellness center.   Disposition: Follow-up with Dr. Gwenlyn Found or me in 6 months.   Jossie Ng. Melis Trochez NP-C    01/29/2022, 5:14 PM [facility] [address] Office [phone] Fax [phone]  Notice: This dictation was prepared with Dragon dictation along with smaller phrase technology. Any transcriptional errors that result from this process are unintentional and may not be corrected upon review.  I spent***minutes examining this patient, reviewing medications, and using patient centered shared decision making involving her cardiac care.  Prior to her visit I spent greater than 20 minutes reviewing her past medical history,   medications, and prior cardiac tests.

## 2022-01-31 ENCOUNTER — Ambulatory Visit: Payer: No Typology Code available for payment source | Admitting: General Practice

## 2022-02-07 ENCOUNTER — Encounter (HOSPITAL_BASED_OUTPATIENT_CLINIC_OR_DEPARTMENT_OTHER): Payer: Self-pay

## 2022-02-07 ENCOUNTER — Ambulatory Visit (HOSPITAL_BASED_OUTPATIENT_CLINIC_OR_DEPARTMENT_OTHER): Payer: No Typology Code available for payment source | Admitting: Internal Medicine

## 2022-02-07 ENCOUNTER — Encounter: Payer: Self-pay | Admitting: Internal Medicine

## 2022-02-10 ENCOUNTER — Other Ambulatory Visit (HOSPITAL_COMMUNITY): Payer: Self-pay | Admitting: Orthopedic Surgery

## 2022-02-10 DIAGNOSIS — M79604 Pain in right leg: Secondary | ICD-10-CM

## 2022-02-13 ENCOUNTER — Other Ambulatory Visit: Payer: Self-pay

## 2022-02-13 ENCOUNTER — Ambulatory Visit (HOSPITAL_COMMUNITY)
Admission: RE | Admit: 2022-02-13 | Discharge: 2022-02-13 | Disposition: A | Discharge status: Home / Self Care | Payer: No Typology Code available for payment source | Source: Ambulatory Visit | Attending: Cardiology | Admitting: Cardiology

## 2022-02-13 DIAGNOSIS — M79604 Pain in right leg: Secondary | ICD-10-CM | POA: Diagnosis not present

## 2022-02-13 DIAGNOSIS — M7989 Other specified soft tissue disorders: Secondary | ICD-10-CM | POA: Insufficient documentation
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
## 2022-02-14 ENCOUNTER — Encounter (HOSPITAL_BASED_OUTPATIENT_CLINIC_OR_DEPARTMENT_OTHER): Payer: Self-pay | Admitting: Physical Therapy

## 2022-02-14 ENCOUNTER — Ambulatory Visit (HOSPITAL_BASED_OUTPATIENT_CLINIC_OR_DEPARTMENT_OTHER): Payer: No Typology Code available for payment source | Attending: Family Medicine | Admitting: Physical Therapy

## 2022-02-14 DIAGNOSIS — M5416 Radiculopathy, lumbar region: Secondary | ICD-10-CM | POA: Diagnosis present

## 2022-02-14 DIAGNOSIS — R262 Difficulty in walking, not elsewhere classified: Secondary | ICD-10-CM | POA: Diagnosis present

## 2022-02-14 DIAGNOSIS — M6281 Muscle weakness (generalized): Secondary | ICD-10-CM | POA: Insufficient documentation

## 2022-02-14 DIAGNOSIS — R2689 Other abnormalities of gait and mobility: Secondary | ICD-10-CM | POA: Diagnosis present

## 2022-02-23 ENCOUNTER — Other Ambulatory Visit: Payer: Self-pay

## 2022-02-23 ENCOUNTER — Encounter (HOSPITAL_BASED_OUTPATIENT_CLINIC_OR_DEPARTMENT_OTHER): Payer: Self-pay | Admitting: Physical Therapy

## 2022-02-23 ENCOUNTER — Ambulatory Visit (HOSPITAL_BASED_OUTPATIENT_CLINIC_OR_DEPARTMENT_OTHER): Payer: No Typology Code available for payment source | Admitting: Physical Therapy

## 2022-02-23 DIAGNOSIS — M5416 Radiculopathy, lumbar region: Secondary | ICD-10-CM

## 2022-02-23 DIAGNOSIS — M6281 Muscle weakness (generalized): Secondary | ICD-10-CM

## 2022-02-23 DIAGNOSIS — R2689 Other abnormalities of gait and mobility: Secondary | ICD-10-CM

## 2022-02-23 DIAGNOSIS — R262 Difficulty in walking, not elsewhere classified: Secondary | ICD-10-CM

## 2022-02-23 NOTE — Therapy (Signed)
?OUTPATIENT PHYSICAL THERAPY TREATMENT NOTE ? ? ?Patient Name: Samuel Goodwin ?MRN: [id number] ?DOB:02-14-1961, 61 y.o., male ?Today's Date: 02/23/2022 ? ?PCP: de [place], Raymond J, MD ?REFERRING PROVIDER: [place], Raymond J, MD ? ? PT End of Session - 02/23/22 1713   ? ? Visit Number 2   ? Number of Visits 15   ? Date for PT Re-Evaluation 04/11/22   ? Authorization Type VA   ? Progress Note Due on Visit 10   ? PT Start Time 1700   ? PT Stop Time 8338   ? PT Time Calculation (min) 43 min   ? Activity Tolerance Patient tolerated treatment well   ? Behavior During Therapy [facility] for tasks assessed/performed   ? ?  ?  ? ?  ? ? ?Past Medical History:  ?Diagnosis Date  ? Alcohol abuse   ? Sobriety since 1990  ? Anxiety   ? Arthritis   ? oa  ? Benign positional vertigo   ? Depression   ? Eardrum trauma   ? 30 years ago/right ear  ? GERD (gastroesophageal reflux disease)   ? Headache   ? sinus  ? Hepatitis C 1994  ? hep c - ? etiology; in [place] 1989-91; S/P interferon , Dr [name]  ? History of bronchitis   ? History of iron deficiency anemia   ? Hoarseness   ? Hyperlipidemia   ? Hypertension   ? Hypothyroidism   ? Obesity   ? OSA (obstructive sleep apnea)   ? on CPAP  ? PONV (postoperative nausea and vomiting)   ? following knee surgery last year   ? PTSD (post-traumatic stress disorder)   ? PTSD (post-traumatic stress disorder)   ? 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 Bilateral  ? Vocal cord polyps   ? ?Past Surgical History:  ?Procedure Laterality Date  ? ANTERIOR HIP REVISION Right 02/07/2016  ? Procedure: ANTERIOR HIP REVISION;  Surgeon: Paralee Cancel, MD;  [facility];  Service: Orthopedics;  Laterality: Right;  ? CARDIOVASCULAR STRESS TEST  02/09/2010  ? No scintigraphic evidence of inducible ischemia.  ? COLONOSCOPY  03/2013  ? HIP CLOSED REDUCTION Right 02/05/2016  ? Procedure: CLOSED MANIPULATION HIP;  Surgeon: Latanya Maudlin, MD;  [facility];  Service: Orthopedics;   Laterality: Right;  ? knee arthroscopic Bilateral   ? LARYNGOSCOPY  08/2009  ? Dr.[name]  ? NASAL FRACTURE SURGERY    ? age 29  ? right knee arthroscopic knee surgery  12 yrs ago  ? dr Theda Sers  ? THROAT SURGERY  aug, sept, Nov 16 2016  ? vocal cord  laser sugery X 3, Dr [name] , [facility]  ? TOTAL HIP ARTHROPLASTY Right 02/01/2016  ? Procedure: RIGHT TOTAL HIP ARTHROPLASTY ANTERIOR APPROACH;  Surgeon: Paralee Cancel, MD;  [facility];  Service: Orthopedics;  Laterality: Right;  ? TOTAL HIP ARTHROPLASTY Left 12/19/2016  ? Procedure: LEFT TOTAL HIP ARTHROPLASTY ANTERIOR APPROACH;  Surgeon: Paralee Cancel, MD;  [facility];  Service: Orthopedics;  Laterality: Left;  ? TOTAL HIP REVISION Right 11/11/2018  ? Procedure: right total hip arthroplasty revision, femoral stem;  Surgeon: Paralee Cancel, MD;  [facility];  Service: Orthopedics;  Laterality: Right;  69mn  ? TOTAL KNEE ARTHROPLASTY Left 11/27/2014  ? Procedure: LEFT TOTAL KNEE ARTHROPLASTY;  Surgeon: SAugustin Schooling MD;  [place]  Service: Orthopedics;  Laterality: Left;  ? TOTAL KNEE ARTHROPLASTY Right 12/16/2019  ? Procedure: TOTAL  KNEE ARTHROPLASTY;  Surgeon: Paralee Cancel, MD;  [facility];  Service: Orthopedics;  Laterality: Right;  70 mins  ? TRANSTHORACIC ECHOCARDIOGRAM  11/08/2005  ? EF 68%, normal LV systolic function  ? UPPER GASTROINTESTINAL ENDOSCOPY  2010  ? Negative, Dr.[name]  ? ?Patient Active Problem List  ? Diagnosis Date Noted  ? Pneumonia due to COVID-19 virus 10/08/2020  ? Upper airway cough syndrome 10/08/2020  ? S/P right TKA 12/16/2019  ? Status post total knee replacement, right 12/16/2019  ? Hip fracture ([place]) 11/13/2018  ? Chronic anemia 11/06/2018  ? Reactive airway disease 11/06/2018  ? Femoral fracture ([place]) 11/05/2018  ? Morbid obesity ([place]) 12/20/2016  ? S/P left THA, AA 12/19/2016  ? Chest pain 08/15/2016  ? S/P right TH revision 02/09/2016  ? Hip instability 02/05/2016  ? H/O total hip arthroplasty 02/05/2016  ? S/P  right THA, AA 02/01/2016  ? S/P knee replacement 11/27/2014  ? Unspecified viral hepatitis C without hepatic coma 08/28/2014  ? DYSPHONIA, CHRONIC 01/06/2010  ? NONSPECIFIC ABNORMAL ELECTROCARDIOGRAM 01/06/2010  ? HOARSENESS, CHRONIC 05/31/2009  ? Hypothyroidism 04/09/2009  ? Hyperlipidemia LDL goal <100 04/09/2009  ? HTN (hypertension) 04/09/2009  ? GERD (gastroesophageal reflux disease) 04/09/2009  ? Sleep apnea 04/09/2009  ? ?  ?REFERRING PROVIDER: [place], Blondell Reveal, MD ?  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  ?THERAPY DIAG:  ?Muscle weakness (generalized) ?  ?Other abnormalities of gait and mobility ?  ?Radiculopathy, lumbar region ?  ?Difficulty in walking, not elsewhere classified ?  ?ONSET DATE: 12/30/21 ?  ?SUBJECTIVE:                                                                                                                                                                                          ?  ?SUBJECTIVE STATEMENT: ?Pt reports he received injection in back ~2 wks ago and this has helped some.  He voices disappointment that he is unable to complete exercises at same capacity as he could 2 months ago.  He complains of tightness "like a fist" in his Lt lower back.  ? ? ?PERTINENT HISTORY:  ?hypertension, OSA, , hyperlipidemia, chest pain, morbid obesity.,throat cancer chronic pain syndrome, degenerative changes in cervical and lumbar spine, PTSD, TBI, tinnitus, bilateral knee replacements, bilateral hip replacements, right hip replacement hardware failure and revision. ?  ?  ?PAIN:  ?Are you having pain? Yes ?NPRS scale: 7/10 ?Pain location: lumbar spine ?Pain orientation: low back bilateral, "belt line" ?PAIN TYPE: aching, tight, and tingling ?Pain description: constant  ?  Aggravating factors: walking ?Relieving factors: sitting, resting ?  ?PRECAUTIONS: Back  and Fall ?  ?WEIGHT BEARING RESTRICTIONS No ?  ?FALLS:  ?Has patient fallen in last 6 months? Yes, Number of falls: 2 ?  ?LIVING ENVIRONMENT: ?Lives with: lives with their family ?Lives in: House/apartment ?Stairs: Yes; Internal: 2 steps; none ?Has following equipment at home: Single point cane ?  ?OCCUPATION: manager/sits a desk ?  ?PLOF: Independent with basic ADLs ?  ?PATIENT GOALS walk straighter ?  ?  ?OBJECTIVE:  ? * All objective findings take at Evaluation unless otherwise noted.  ? ?DIAGNOSTIC FINDINGS:  ?Neg blood clots LE ?  ?PATIENT SURVEYS:  ?FOTO 20 with goal of 15 ?  ?SCREENING FOR RED FLAGS: ?          neg ?  ?COGNITION: ?         Overall cognitive status: Within functional limits for tasks assessed              ?          ?SENSATION: ?numbness, burning and tingling in bilateral feet. ?         Light touch: Deficits feet ?          ?  ?MUSCLE LENGTH: ?Hamstrings: Right 80 deg; Left 60 deg ?  ?  ?POSTURE:  ?Right hip elevation, flattened lumbar spine ?  ?PALPATION: ?Lt post innominate rotation ?  ?LUMBARAROM/PROM ?  ?A/PROM A/PROM  ?02/14/2022  ?Flexion 18 inches fingertips to floor  ?Extension -10  ?Right lateral flexion    ?Left lateral flexion    ?Right rotation    ?Left rotation    ? (Blank rows = not tested) ?  ?LE AROM/PROM: ?  ?A/PROM Right ?02/14/2022 Left ?02/14/2022  ?Hip flexion 90 110  ?Hip extension      ?Hip abduction      ?Hip adduction      ?Hip internal rotation      ?Hip external rotation      ?Knee flexion wfl wfl  ?Knee extension      ?Ankle dorsiflexion 5 30  ?Ankle plantarflexion      ?Ankle inversion      ?Ankle eversion      ? (Blank rows = not tested) ?  ?LE MMT: ?  ?MMT Right ?02/14/2022 Left ?02/14/2022  ?Hip flexion 22.7 47.78  ?Hip extension 27 31  ?Hip abduction 26.0 49.2  ?Hip adduction      ?Hip internal rotation      ?Hip external rotation      ?Knee flexion 35.6 43.5  ?Knee extension      ?Ankle dorsiflexion      ?Ankle plantarflexion      ?Ankle inversion      ?Ankle eversion       ? (Blank rows = not tested) ?  ?LUMBAR SPECIAL TESTS:  ?Straight leg raise test: Negative, Slump test: Negative, and Long sit test: Negative ?  ?FUNCTIONAL TESTS:  ?6 minute walk test 655 ?5x STS test  1

## 2022-02-27 ENCOUNTER — Ambulatory Visit (HOSPITAL_BASED_OUTPATIENT_CLINIC_OR_DEPARTMENT_OTHER): Payer: No Typology Code available for payment source | Admitting: Physical Therapy

## 2022-02-27 ENCOUNTER — Other Ambulatory Visit: Payer: Self-pay

## 2022-02-27 ENCOUNTER — Encounter (HOSPITAL_BASED_OUTPATIENT_CLINIC_OR_DEPARTMENT_OTHER): Payer: Self-pay | Admitting: Physical Therapy

## 2022-02-27 DIAGNOSIS — R2689 Other abnormalities of gait and mobility: Secondary | ICD-10-CM

## 2022-02-27 DIAGNOSIS — M5416 Radiculopathy, lumbar region: Secondary | ICD-10-CM

## 2022-02-27 DIAGNOSIS — R262 Difficulty in walking, not elsewhere classified: Secondary | ICD-10-CM

## 2022-02-27 DIAGNOSIS — M6281 Muscle weakness (generalized): Secondary | ICD-10-CM | POA: Diagnosis not present

## 2022-02-27 NOTE — Therapy (Signed)
?OUTPATIENT PHYSICAL THERAPY TREATMENT NOTE ? ? ?Patient Name: Samuel Goodwin ?MRN: [id number] ?DOB:1961/04/15, 61 y.o., male ?Today's Date: 02/27/2022 ? ?PCP: de [place], Raymond J, MD ?REFERRING PROVIDER: [place], Raymond J, MD ? ? PT End of Session - 02/27/22 1709   ? ? Visit Number 3   ? Number of Visits 15   ? Date for PT Re-Evaluation 04/11/22   ? Authorization Type [place]   ? PT Start Time 1702   ? PT Stop Time 0160   ? PT Time Calculation (min) 43 min   ? Activity Tolerance Patient tolerated treatment well   ? Behavior During Therapy [facility] for tasks assessed/performed   ? ?  ?  ? ?  ? ? ? ?Past Medical History:  ?Diagnosis Date  ? Alcohol abuse   ? Sobriety since 1990  ? Anxiety   ? Arthritis   ? oa  ? Benign positional vertigo   ? Depression   ? Eardrum trauma   ? 30 years ago/right ear  ? GERD (gastroesophageal reflux disease)   ? Headache   ? sinus  ? Hepatitis C 1994  ? hep c - ? etiology; in [place] 1989-91; S/P interferon , Dr [name]  ? History of bronchitis   ? History of iron deficiency anemia   ? Hoarseness   ? Hyperlipidemia   ? Hypertension   ? Hypothyroidism   ? Obesity   ? OSA (obstructive sleep apnea)   ? on CPAP  ? PONV (postoperative nausea and vomiting)   ? following knee surgery last year   ? PTSD (post-traumatic stress disorder)   ? PTSD (post-traumatic stress disorder)   ? 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 Bilateral  ? Vocal cord polyps   ? ?Past Surgical History:  ?Procedure Laterality Date  ? ANTERIOR HIP REVISION Right 02/07/2016  ? Procedure: ANTERIOR HIP REVISION;  Surgeon: Paralee Cancel, MD;  Location: [facility];  Service: Orthopedics;  Laterality: Right;  ? CARDIOVASCULAR STRESS TEST  02/09/2010  ? No scintigraphic evidence of inducible ischemia.  ? COLONOSCOPY  03/2013  ? HIP CLOSED REDUCTION Right 02/05/2016  ? Procedure: CLOSED MANIPULATION HIP;  Surgeon: Latanya Maudlin, MD;  Location: [facility];  Service: Orthopedics;  Laterality: Right;  ? knee  arthroscopic Bilateral   ? LARYNGOSCOPY  08/2009  ? Dr.[name]  ? NASAL FRACTURE SURGERY    ? age 31  ? right knee arthroscopic knee surgery  12 yrs ago  ? dr Theda Sers  ? THROAT SURGERY  aug, sept, Nov 16 2016  ? vocal cord  laser sugery X 3, Dr [name] , [facility]  ? TOTAL HIP ARTHROPLASTY Right 02/01/2016  ? Procedure: RIGHT TOTAL HIP ARTHROPLASTY ANTERIOR APPROACH;  Surgeon: Paralee Cancel, MD;  Location: [facility];  Service: Orthopedics;  Laterality: Right;  ? TOTAL HIP ARTHROPLASTY Left 12/19/2016  ? Procedure: LEFT TOTAL HIP ARTHROPLASTY ANTERIOR APPROACH;  Surgeon: Paralee Cancel, MD;  [facility]: [facility];  Service: Orthopedics;  Laterality: Left;  ? TOTAL HIP REVISION Right 11/11/2018  ? Procedure: right total hip arthroplasty revision, femoral stem;  Surgeon: Paralee Cancel, MD;  Location: [facility];  Service: Orthopedics;  Laterality: Right;  11mn  ? TOTAL KNEE ARTHROPLASTY Left 11/27/2014  ? Procedure: LEFT TOTAL KNEE ARTHROPLASTY;  Surgeon: SAugustin Schooling MD;  [place]  Service: Orthopedics;  Laterality: Left;  ? TOTAL KNEE ARTHROPLASTY Right 12/16/2019  ? Procedure: TOTAL KNEE ARTHROPLASTY;  Surgeon: OParalee Cancel MD;  [facility];  Service: Orthopedics;  Laterality: Right;  70 mins  ? TRANSTHORACIC ECHOCARDIOGRAM  11/08/2005  ? EF 68%, normal LV systolic function  ? UPPER GASTROINTESTINAL ENDOSCOPY  2010  ? Negative, Dr.[name]  ? ?Patient Active Problem List  ? Diagnosis Date Noted  ? Pneumonia due to COVID-19 virus 10/08/2020  ? Upper airway cough syndrome 10/08/2020  ? S/P right TKA 12/16/2019  ? Status post total knee replacement, right 12/16/2019  ? Hip fracture ([place]) 11/13/2018  ? Chronic anemia 11/06/2018  ? Reactive airway disease 11/06/2018  ? Femoral fracture ([place]) 11/05/2018  ? Morbid obesity ([place]) 12/20/2016  ? S/P left THA, AA 12/19/2016  ? Chest pain 08/15/2016  ? S/P right TH revision 02/09/2016  ? Hip instability 02/05/2016  ? H/O total hip arthroplasty 02/05/2016  ? S/P right THA, AA 02/01/2016  ?  S/P knee replacement 11/27/2014  ? Unspecified viral hepatitis C without hepatic coma 08/28/2014  ? DYSPHONIA, CHRONIC 01/06/2010  ? NONSPECIFIC ABNORMAL ELECTROCARDIOGRAM 01/06/2010  ? HOARSENESS, CHRONIC 05/31/2009  ? Hypothyroidism 04/09/2009  ? Hyperlipidemia LDL goal <100 04/09/2009  ? HTN (hypertension) 04/09/2009  ? GERD (gastroesophageal reflux disease) 04/09/2009  ? Sleep apnea 04/09/2009  ? ?  ?REFERRING PROVIDER: [place], Blondell Reveal, MD ?  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  ?THERAPY DIAG:  ?Muscle weakness (generalized) ?  ?Other abnormalities of gait and mobility ?  ?Radiculopathy, lumbar region ?  ?Difficulty in walking, not elsewhere classified ?  ?ONSET DATE: 12/30/21 ?  ?SUBJECTIVE:                                                                                                                                                                                          ?  ?SUBJECTIVE STATEMENT: ?Pt reports he did his 14 laps in pool yesterday and then tried to step up with RLE 2x with use of BUE on rails.  His Rt knee was sore the rest of day, so he rested.  "I'm tired of wearing this thing (his LLE) out".  "My Rt leg just feels swollen" ? ? ?PERTINENT HISTORY:  ?hypertension, OSA, , hyperlipidemia, chest pain, morbid obesity.,throat cancer chronic pain syndrome, degenerative changes in cervical and lumbar spine, PTSD, TBI, tinnitus, bilateral knee replacements, bilateral hip replacements, right hip replacement hardware failure and revision. ?  ?  ?PAIN:  ?Are you having pain? Yes ?NPRS scale: 4/10 ?Pain location: RLE (mostly knee) ?Pain orientation: see above ?PAIN TYPE: aching, tight, and tingling ?Pain description: constant  ?Aggravating factors: walking ?Relieving  factors: sitting, resting ?  ?PRECAUTIONS: Back and Fall ?  ?WEIGHT BEARING RESTRICTIONS No ?   ?FALLS:  ?Has patient fallen in last 6 months? Yes, Number of falls: 2 ?  ?LIVING ENVIRONMENT: ?Lives with: lives with their family ?Lives in: House/apartment ?Stairs: Yes; Internal: 2 steps; none ?Has following equipment at home: Single point cane ?  ?OCCUPATION: manager/sits a desk ?  ?PLOF: Independent with basic ADLs ?  ?PATIENT GOALS walk straighter ?  ?  ?OBJECTIVE:  ? * All objective findings take at Evaluation unless otherwise noted.  ? ?DIAGNOSTIC FINDINGS:  ?Neg blood clots LE ?  ?PATIENT SURVEYS:  ?FOTO 38 with goal of 35 ?  ?SCREENING FOR RED FLAGS: ?          neg ?  ?COGNITION: ?         Overall cognitive status: Within functional limits for tasks assessed              ?          ?SENSATION: ?numbness, burning and tingling in bilateral feet. ?         Light touch: Deficits feet ?          ?  ?MUSCLE LENGTH: ?Hamstrings: Right 80 deg; Left 60 deg ?  ?  ?POSTURE:  ?Right hip elevation, flattened lumbar spine ?  ?PALPATION: ?Lt post innominate rotation ?  ?LUMBARAROM/PROM ?  ?A/PROM A/PROM  ?02/14/2022  ?Flexion 18 inches fingertips to floor  ?Extension -10  ?Right lateral flexion    ?Left lateral flexion    ?Right rotation    ?Left rotation    ? (Blank rows = not tested) ?  ?LE AROM/PROM: ?  ?A/PROM Right ?02/14/2022 Left ?02/14/2022  ?Hip flexion 90 110  ?Hip extension      ?Hip abduction      ?Hip adduction      ?Hip internal rotation      ?Hip external rotation      ?Knee flexion wfl wfl  ?Knee extension      ?Ankle dorsiflexion 5 30  ?Ankle plantarflexion      ?Ankle inversion      ?Ankle eversion      ? (Blank rows = not tested) ?  ?LE MMT: ?  ?MMT Right ?02/14/2022 Left ?02/14/2022  ?Hip flexion 22.7 47.78  ?Hip extension 27 31  ?Hip abduction 26.0 49.2  ?Hip adduction      ?Hip internal rotation      ?Hip external rotation      ?Knee flexion 35.6 43.5  ?Knee extension      ?Ankle dorsiflexion      ?Ankle plantarflexion      ?Ankle inversion      ?Ankle eversion      ? (Blank rows = not tested) ?  ?LUMBAR  SPECIAL TESTS:  ?Straight leg raise test: Negative, Slump test: Negative, and Long sit test: Negative ?  ?FUNCTIONAL TESTS:  ?6 minute walk test 655 ?5x STS test  17s ?  ?GAIT: ?Distance walked: 655 ?Assistive device

## 2022-03-01 ENCOUNTER — Ambulatory Visit (HOSPITAL_BASED_OUTPATIENT_CLINIC_OR_DEPARTMENT_OTHER): Payer: No Typology Code available for payment source | Admitting: Physical Therapy

## 2022-03-01 ENCOUNTER — Encounter (HOSPITAL_BASED_OUTPATIENT_CLINIC_OR_DEPARTMENT_OTHER): Payer: Self-pay | Admitting: Physical Therapy

## 2022-03-01 ENCOUNTER — Other Ambulatory Visit: Payer: Self-pay

## 2022-03-01 DIAGNOSIS — R2689 Other abnormalities of gait and mobility: Secondary | ICD-10-CM

## 2022-03-01 DIAGNOSIS — M6281 Muscle weakness (generalized): Secondary | ICD-10-CM

## 2022-03-01 DIAGNOSIS — M5416 Radiculopathy, lumbar region: Secondary | ICD-10-CM

## 2022-03-01 NOTE — Therapy (Signed)
?OUTPATIENT PHYSICAL THERAPY TREATMENT NOTE ? ? ?Patient Name: Samuel Goodwin ?MRN: [id number] ?DOB:July 07, 1961, 61 y.o., male ?Today's Date: 03/01/2022 ? ?PCP: [place], Raymond J, MD ?REFERRING PROVIDER: [place], Raymond J, MD ? ? PT End of Session - 03/01/22 1714   ? ? Visit Number 4   ? Number of Visits 15   ? Date for PT Re-Evaluation 04/11/22   ? Authorization Type [facility]   ? Authorization Time Period 09/16/21-01/14/22   ? Authorization - Number of Visits 15   ? Progress Note Due on Visit 10   ? PT Start Time 5573   ? PT Stop Time 1803   ? PT Time Calculation (min) 48 min   ? ?  ?  ? ?  ? ? ? ?Past Medical History:  ?Diagnosis Date  ? Alcohol abuse   ? Sobriety since 1990  ? Anxiety   ? Arthritis   ? oa  ? Benign positional vertigo   ? Depression   ? Eardrum trauma   ? 30 years ago/right ear  ? GERD (gastroesophageal reflux disease)   ? Headache   ? sinus  ? Hepatitis C 1994  ? hep c - ? etiology; in [place] 1989-91; S/P interferon , Dr [name]  ? History of bronchitis   ? History of iron deficiency anemia   ? Hoarseness   ? Hyperlipidemia   ? Hypertension   ? Hypothyroidism   ? Obesity   ? OSA (obstructive sleep apnea)   ? on CPAP  ? PONV (postoperative nausea and vomiting)   ? following knee surgery last year   ? PTSD (post-traumatic stress disorder)   ? PTSD (post-traumatic stress disorder)   ? 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 Bilateral  ? Vocal cord polyps   ? ?Past Surgical History:  ?Procedure Laterality Date  ? ANTERIOR HIP REVISION Right 02/07/2016  ? Procedure: ANTERIOR HIP REVISION;  Surgeon: Paralee Cancel, MD;  [facility];  Service: Orthopedics;  Laterality: Right;  ? CARDIOVASCULAR STRESS TEST  02/09/2010  ? No scintigraphic evidence of inducible ischemia.  ? COLONOSCOPY  03/2013  ? HIP CLOSED REDUCTION Right 02/05/2016  ? Procedure: CLOSED MANIPULATION HIP;  Surgeon: Latanya Maudlin, MD;  [facility];  Service: Orthopedics;  Laterality: Right;  ? knee  arthroscopic Bilateral   ? LARYNGOSCOPY  08/2009  ? Dr.[name]  ? NASAL FRACTURE SURGERY    ? age 56  ? right knee arthroscopic knee surgery  12 yrs ago  ? dr Theda Sers  ? THROAT SURGERY  aug, sept, Nov 16 2016  ? vocal cord  laser sugery X 3, Dr [name] , [facility]  ? TOTAL HIP ARTHROPLASTY Right 02/01/2016  ? Procedure: RIGHT TOTAL HIP ARTHROPLASTY ANTERIOR APPROACH;  Surgeon: Paralee Cancel, MD;  [facility];  Service: Orthopedics;  Laterality: Right;  ? TOTAL HIP ARTHROPLASTY Left 12/19/2016  ? Procedure: LEFT TOTAL HIP ARTHROPLASTY ANTERIOR APPROACH;  Surgeon: Paralee Cancel, MD;  [facility];  Service: Orthopedics;  Laterality: Left;  ? TOTAL HIP REVISION Right 11/11/2018  ? Procedure: right total hip arthroplasty revision, femoral stem;  Surgeon: Paralee Cancel, MD;  [facility];  Service: Orthopedics;  Laterality: Right;  83mn  ? TOTAL KNEE ARTHROPLASTY Left 11/27/2014  ? Procedure: LEFT TOTAL KNEE ARTHROPLASTY;  Surgeon: SAugustin Schooling MD;  [place]  Service: Orthopedics;  Laterality: Left;  ? TOTAL KNEE ARTHROPLASTY Right 12/16/2019  ? Procedure: TOTAL KNEE ARTHROPLASTY;  Surgeon: Paralee Cancel, MD;  [facility];  Service: Orthopedics;  Laterality: Right;  70 mins  ? TRANSTHORACIC ECHOCARDIOGRAM  11/08/2005  ? EF 68%, normal LV systolic function  ? UPPER GASTROINTESTINAL ENDOSCOPY  2010  ? Negative, Dr.[name]  ? ?Patient Active Problem List  ? Diagnosis Date Noted  ? Pneumonia due to COVID-19 virus 10/08/2020  ? Upper airway cough syndrome 10/08/2020  ? S/P right TKA 12/16/2019  ? Status post total knee replacement, right 12/16/2019  ? Hip fracture ([place]) 11/13/2018  ? Chronic anemia 11/06/2018  ? Reactive airway disease 11/06/2018  ? Femoral fracture ([place]) 11/05/2018  ? Morbid obesity ([place]) 12/20/2016  ? S/P left THA, AA 12/19/2016  ? Chest pain 08/15/2016  ? S/P right TH revision 02/09/2016  ? Hip instability 02/05/2016  ? H/O total hip arthroplasty 02/05/2016  ? S/P right THA, AA 02/01/2016  ?  S/P knee replacement 11/27/2014  ? Unspecified viral hepatitis C without hepatic coma 08/28/2014  ? DYSPHONIA, CHRONIC 01/06/2010  ? NONSPECIFIC ABNORMAL ELECTROCARDIOGRAM 01/06/2010  ? HOARSENESS, CHRONIC 05/31/2009  ? Hypothyroidism 04/09/2009  ? Hyperlipidemia LDL goal <100 04/09/2009  ? HTN (hypertension) 04/09/2009  ? GERD (gastroesophageal reflux disease) 04/09/2009  ? Sleep apnea 04/09/2009  ? ?  ?REFERRING PROVIDER: [place], Blondell Reveal, MD ?  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  ?THERAPY DIAG:  ?Muscle weakness (generalized) ?  ?Other abnormalities of gait and mobility ?  ?Radiculopathy, lumbar region ?  ?Difficulty in walking, not elsewhere classified ?  ?ONSET DATE: 12/30/21 ?  ?SUBJECTIVE:                                                                                                                                                                                          ?  ?SUBJECTIVE STATEMENT: ?"I twisted to open a blinds 2 days ago and something went now my pain is high across my lb." ? ? ?PERTINENT HISTORY:  ?hypertension, OSA, , hyperlipidemia, chest pain, morbid obesity.,throat cancer chronic pain syndrome, degenerative changes in cervical and lumbar spine, PTSD, TBI, tinnitus, bilateral knee replacements, bilateral hip replacements, right hip replacement hardware failure and revision. ?  ?  ?PAIN:  ?Are you having pain? Yes ?NPRS scale: 9/10 ?Pain location: RLE (mostly knee) ?Pain orientation: see above ?PAIN TYPE: aching, tight, and tingling ?Pain description: constant  ?Aggravating factors: walking ?Relieving factors: sitting, resting ?  ?PRECAUTIONS: Back and Fall ?  ?WEIGHT BEARING RESTRICTIONS No ?  ?FALLS:  ?Has patient fallen in last 6 months? Yes, Number of falls:  2 ?  ?LIVING ENVIRONMENT: ?Lives with: lives with their family ?Lives in:  House/apartment ?Stairs: Yes; Internal: 2 steps; none ?Has following equipment at home: Single point cane ?  ?OCCUPATION: manager/sits a desk ?  ?PLOF: Independent with basic ADLs ?  ?PATIENT GOALS walk straighter ?  ?  ?OBJECTIVE:  ? * All objective findings take at Evaluation unless otherwise noted.  ? ?DIAGNOSTIC FINDINGS:  ?Neg blood clots LE ?  ?PATIENT SURVEYS:  ?FOTO 66 with goal of 23 ?  ?SCREENING FOR RED FLAGS: ?          neg ?  ?COGNITION: ?         Overall cognitive status: Within functional limits for tasks assessed              ?          ?SENSATION: ?numbness, burning and tingling in bilateral feet. ?         Light touch: Deficits feet ?          ?  ?MUSCLE LENGTH: ?Hamstrings: Right 80 deg; Left 60 deg ?  ?  ?POSTURE:  ?Right hip elevation, flattened lumbar spine ?  ?PALPATION: ?Lt post innominate rotation ?  ?LUMBARAROM/PROM ?  ?A/PROM A/PROM  ?02/14/2022  ?Flexion 18 inches fingertips to floor  ?Extension -10  ?Right lateral flexion    ?Left lateral flexion    ?Right rotation    ?Left rotation    ? (Blank rows = not tested) ?  ?LE AROM/PROM: ?  ?A/PROM Right ?02/14/2022 Left ?02/14/2022  ?Hip flexion 90 110  ?Hip extension      ?Hip abduction      ?Hip adduction      ?Hip internal rotation      ?Hip external rotation      ?Knee flexion wfl wfl  ?Knee extension      ?Ankle dorsiflexion 5 30  ?Ankle plantarflexion      ?Ankle inversion      ?Ankle eversion      ? (Blank rows = not tested) ?  ?LE MMT: ?  ?MMT Right ?02/14/2022 Left ?02/14/2022  ?Hip flexion 22.7 47.78  ?Hip extension 27 31  ?Hip abduction 26.0 49.2  ?Hip adduction      ?Hip internal rotation      ?Hip external rotation      ?Knee flexion 35.6 43.5  ?Knee extension      ?Ankle dorsiflexion      ?Ankle plantarflexion      ?Ankle inversion      ?Ankle eversion      ? (Blank rows = not tested) ?  ?LUMBAR SPECIAL TESTS:  ?Straight leg raise test: Negative, Slump test: Negative, and Long sit test: Negative ?  ?FUNCTIONAL TESTS:  ?6 minute walk test  655 ?5x STS test  17s ?  ?GAIT: ?Distance walked: 655 ?Assistive device utilized: None ?Level of assistance: Complete Independence ?Comments: antalgic, significant lateral displacement increasing as distance increases, left

## 2022-03-02 ENCOUNTER — Ambulatory Visit (HOSPITAL_BASED_OUTPATIENT_CLINIC_OR_DEPARTMENT_OTHER): Payer: Self-pay | Admitting: Physical Therapy

## 2022-03-13 ENCOUNTER — Ambulatory Visit: Payer: No Typology Code available for payment source | Admitting: Internal Medicine

## 2022-03-15 ENCOUNTER — Encounter (HOSPITAL_BASED_OUTPATIENT_CLINIC_OR_DEPARTMENT_OTHER): Payer: Self-pay | Admitting: Physical Therapy

## 2022-03-15 ENCOUNTER — Ambulatory Visit (HOSPITAL_BASED_OUTPATIENT_CLINIC_OR_DEPARTMENT_OTHER): Payer: No Typology Code available for payment source | Attending: Family Medicine | Admitting: Physical Therapy

## 2022-03-15 DIAGNOSIS — R2689 Other abnormalities of gait and mobility: Secondary | ICD-10-CM | POA: Insufficient documentation

## 2022-03-15 DIAGNOSIS — R262 Difficulty in walking, not elsewhere classified: Secondary | ICD-10-CM | POA: Insufficient documentation

## 2022-03-15 DIAGNOSIS — M25551 Pain in right hip: Secondary | ICD-10-CM | POA: Insufficient documentation

## 2022-03-15 DIAGNOSIS — M5416 Radiculopathy, lumbar region: Secondary | ICD-10-CM | POA: Insufficient documentation

## 2022-03-15 DIAGNOSIS — M6281 Muscle weakness (generalized): Secondary | ICD-10-CM | POA: Insufficient documentation

## 2022-03-15 NOTE — Therapy (Signed)
?OUTPATIENT PHYSICAL THERAPY TREATMENT NOTE ? ? ?Patient Name: Samuel Goodwin ?MRN: [id number] ?DOB:1961-04-27, 62 y.o., male ?Today's Date: 03/15/2022 ? ?PCP: de [place], Raymond J, MD ?REFERRING PROVIDER: [place], Raymond J, MD ? ? PT End of Session - 03/15/22 1723   ? ? Visit Number 5   ? Number of Visits 15   ? Date for PT Re-Evaluation 04/11/22   ? Authorization Type [facility]   ? Authorization Time Period 09/16/21-01/14/22   ? Authorization - Number of Visits 15   ? Progress Note Due on Visit 10   ? PT Start Time 8250   ? PT Stop Time 5397   ? PT Time Calculation (min) 40 min   ? ?  ?  ? ?  ? ? ? ?Past Medical History:  ?Diagnosis Date  ? Alcohol abuse   ? Sobriety since 1990  ? Anxiety   ? Arthritis   ? oa  ? Benign positional vertigo   ? Depression   ? Eardrum trauma   ? 30 years ago/right ear  ? GERD (gastroesophageal reflux disease)   ? Headache   ? sinus  ? Hepatitis C 1994  ? hep c - ? etiology; in [place] 1989-91; S/P interferon , Dr [name]  ? History of bronchitis   ? History of iron deficiency anemia   ? Hoarseness   ? Hyperlipidemia   ? Hypertension   ? Hypothyroidism   ? Obesity   ? OSA (obstructive sleep apnea)   ? on CPAP  ? PONV (postoperative nausea and vomiting)   ? following knee surgery last year   ? PTSD (post-traumatic stress disorder)   ? PTSD (post-traumatic stress disorder)   ? 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 Bilateral  ? Vocal cord polyps   ? ?Past Surgical History:  ?Procedure Laterality Date  ? ANTERIOR HIP REVISION Right 02/07/2016  ? Procedure: ANTERIOR HIP REVISION;  Surgeon: Paralee Cancel, MD;  [facility];  Service: Orthopedics;  Laterality: Right;  ? CARDIOVASCULAR STRESS TEST  02/09/2010  ? No scintigraphic evidence of inducible ischemia.  ? COLONOSCOPY  03/2013  ? HIP CLOSED REDUCTION Right 02/05/2016  ? Procedure: CLOSED MANIPULATION HIP;  Surgeon: Latanya Maudlin, MD;  [facility];  Service: Orthopedics;  Laterality: Right;  ? knee  arthroscopic Bilateral   ? LARYNGOSCOPY  08/2009  ? Dr.[name]  ? NASAL FRACTURE SURGERY    ? age 42  ? right knee arthroscopic knee surgery  12 yrs ago  ? dr Theda Sers  ? THROAT SURGERY  aug, sept, Nov 16 2016  ? vocal cord  laser sugery X 3, Dr [name] , [facility]  ? TOTAL HIP ARTHROPLASTY Right 02/01/2016  ? Procedure: RIGHT TOTAL HIP ARTHROPLASTY ANTERIOR APPROACH;  Surgeon: Paralee Cancel, MD;  [facility];  Service: Orthopedics;  Laterality: Right;  ? TOTAL HIP ARTHROPLASTY Left 12/19/2016  ? Procedure: LEFT TOTAL HIP ARTHROPLASTY ANTERIOR APPROACH;  Surgeon: Paralee Cancel, MD;  [facility];  Service: Orthopedics;  Laterality: Left;  ? TOTAL HIP REVISION Right 11/11/2018  ? Procedure: right total hip arthroplasty revision, femoral stem;  Surgeon: Paralee Cancel, MD;  [facility];  Service: Orthopedics;  Laterality: Right;  102mn  ? TOTAL KNEE ARTHROPLASTY Left 11/27/2014  ? Procedure: LEFT TOTAL KNEE ARTHROPLASTY;  Surgeon: SAugustin Schooling MD;  [place]  Service: Orthopedics;  Laterality: Left;  ? TOTAL KNEE ARTHROPLASTY Right 12/16/2019  ? Procedure: TOTAL KNEE ARTHROPLASTY;  Surgeon: Paralee Cancel, MD;  [facility];  Service: Orthopedics;  Laterality: Right;  70 mins  ? TRANSTHORACIC ECHOCARDIOGRAM  11/08/2005  ? EF 68%, normal LV systolic function  ? UPPER GASTROINTESTINAL ENDOSCOPY  2010  ? Negative, Dr.[name]  ? ?Patient Active Problem List  ? Diagnosis Date Noted  ? Pneumonia due to COVID-19 virus 10/08/2020  ? Upper airway cough syndrome 10/08/2020  ? S/P right TKA 12/16/2019  ? Status post total knee replacement, right 12/16/2019  ? Hip fracture ([place]) 11/13/2018  ? Chronic anemia 11/06/2018  ? Reactive airway disease 11/06/2018  ? Femoral fracture ([place]) 11/05/2018  ? Morbid obesity ([place]) 12/20/2016  ? S/P left THA, AA 12/19/2016  ? Chest pain 08/15/2016  ? S/P right TH revision 02/09/2016  ? Hip instability 02/05/2016  ? H/O total hip arthroplasty 02/05/2016  ? S/P right THA, AA 02/01/2016  ?  S/P knee replacement 11/27/2014  ? Unspecified viral hepatitis C without hepatic coma 08/28/2014  ? DYSPHONIA, CHRONIC 01/06/2010  ? NONSPECIFIC ABNORMAL ELECTROCARDIOGRAM 01/06/2010  ? HOARSENESS, CHRONIC 05/31/2009  ? Hypothyroidism 04/09/2009  ? Hyperlipidemia LDL goal <100 04/09/2009  ? HTN (hypertension) 04/09/2009  ? GERD (gastroesophageal reflux disease) 04/09/2009  ? Sleep apnea 04/09/2009  ? ?  ?REFERRING PROVIDER: [place], Blondell Reveal, MD ?  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  ?THERAPY DIAG:  ?Muscle weakness (generalized) ?  ?Other abnormalities of gait and mobility ?  ?Radiculopathy, lumbar region ?  ?Difficulty in walking, not elsewhere classified ?  ?ONSET DATE: 12/30/21 ?  ?SUBJECTIVE:                                                                                                                                                                                          ?  ?SUBJECTIVE STATEMENT: ?"I've not been doing much, not lifting anything just sitting and pretty much walking.  Coming to pool and cycling." ? ? ?PERTINENT HISTORY:  ?hypertension, OSA, , hyperlipidemia, chest pain, morbid obesity.,throat cancer chronic pain syndrome, degenerative changes in cervical and lumbar spine, PTSD, TBI, tinnitus, bilateral knee replacements, bilateral hip replacements, right hip replacement hardware failure and revision. ?  ?  ?PAIN:  ?Are you having pain? Yes ?NPRS scale: 6-7/10 ?Pain location: RLE (mostly knee)/LB ?Pain orientation: see above ?PAIN TYPE: aching, tight, and tingling, occasional jabbing ?Pain description: constant  ?Aggravating factors: walking ?Relieving factors: sitting, resting ?  ?PRECAUTIONS: Back and Fall ?  ?WEIGHT BEARING RESTRICTIONS No ?  ?FALLS:  ?Has patient fallen in last 6 months? Yes, Number  of falls: 2 ?  ?LIVING ENVIRONMENT: ?Lives with:  lives with their family ?Lives in: House/apartment ?Stairs: Yes; Internal: 2 steps; none ?Has following equipment at home: Single point cane ?  ?OCCUPATION: manager/sits a desk ?  ?PLOF: Independent with basic ADLs ?  ?PATIENT GOALS walk straighter ?  ?  ?OBJECTIVE:  ? * All objective findings take at Evaluation unless otherwise noted.  ? ?DIAGNOSTIC FINDINGS:  ?Neg blood clots LE ?  ?PATIENT SURVEYS:  ?FOTO 54 with goal of 50 ?  ?SCREENING FOR RED FLAGS: ?          neg ?  ?COGNITION: ?         Overall cognitive status: Within functional limits for tasks assessed              ?          ?SENSATION: ?numbness, burning and tingling in bilateral feet. ?         Light touch: Deficits feet ?          ?  ?MUSCLE LENGTH: ?Hamstrings: Right 80 deg; Left 60 deg ?  ?  ?POSTURE:  ?Right hip elevation, flattened lumbar spine ?  ?PALPATION: ?Lt post innominate rotation ?  ?LUMBARAROM/PROM ?  ?A/PROM A/PROM  ?02/14/2022  ?Flexion 18 inches fingertips to floor  ?Extension -10  ?Right lateral flexion    ?Left lateral flexion    ?Right rotation    ?Left rotation    ? (Blank rows = not tested) ?  ?LE AROM/PROM: ?  ?A/PROM Right ?02/14/2022 Left ?02/14/2022  ?Hip flexion 90 110  ?Hip extension      ?Hip abduction      ?Hip adduction      ?Hip internal rotation      ?Hip external rotation      ?Knee flexion wfl wfl  ?Knee extension      ?Ankle dorsiflexion 5 30  ?Ankle plantarflexion      ?Ankle inversion      ?Ankle eversion      ? (Blank rows = not tested) ?  ?LE MMT: ?  ?MMT Right ?02/14/2022 Left ?02/14/2022  ?Hip flexion 22.7 47.78  ?Hip extension 27 31  ?Hip abduction 26.0 49.2  ?Hip adduction      ?Hip internal rotation      ?Hip external rotation      ?Knee flexion 35.6 43.5  ?Knee extension      ?Ankle dorsiflexion      ?Ankle plantarflexion      ?Ankle inversion      ?Ankle eversion      ? (Blank rows = not tested) ?  ?LUMBAR SPECIAL TESTS:  ?Straight leg raise test: Negative, Slump test: Negative, and Long sit test: Negative ?   ?FUNCTIONAL TESTS:  ?6 minute walk test 655 ?5x STS test  17s ?  ?GAIT: ?Distance walked: 655 ?Assistive device utilized: None ?Level of assistance: Complete Independence ?Comments: antalgic, significant lateral disp

## 2022-03-23 ENCOUNTER — Encounter (HOSPITAL_BASED_OUTPATIENT_CLINIC_OR_DEPARTMENT_OTHER): Payer: Self-pay | Admitting: Physical Therapy

## 2022-03-23 ENCOUNTER — Ambulatory Visit (HOSPITAL_BASED_OUTPATIENT_CLINIC_OR_DEPARTMENT_OTHER): Payer: No Typology Code available for payment source | Admitting: Physical Therapy

## 2022-03-23 DIAGNOSIS — M5416 Radiculopathy, lumbar region: Secondary | ICD-10-CM

## 2022-03-23 DIAGNOSIS — M6281 Muscle weakness (generalized): Secondary | ICD-10-CM | POA: Diagnosis not present

## 2022-03-23 DIAGNOSIS — R262 Difficulty in walking, not elsewhere classified: Secondary | ICD-10-CM

## 2022-03-23 DIAGNOSIS — R2689 Other abnormalities of gait and mobility: Secondary | ICD-10-CM

## 2022-03-23 NOTE — Therapy (Signed)
?OUTPATIENT PHYSICAL THERAPY TREATMENT NOTE ? ? ?Patient Name: Samuel Goodwin ?MRN: [id number] ?DOB:09/26/1961, 61 y.o., male ?Today's Date: 03/23/2022 ? ?PCP: [place], Raymond J, MD ?REFERRING PROVIDER: [place], Raymond J, MD ? ? PT End of Session - 03/23/22 1627   ? ? Visit Number 6   ? Number of Visits 15   ? Date for PT Re-Evaluation 04/11/22   ? Authorization Type [facility]   ? Authorization - Visit Number 6   ? Authorization - Number of Visits 15   ? PT Start Time 4888   ? PT Stop Time 1655   ? PT Time Calculation (min) 38 min   ? ?  ?  ? ?  ? ? ? ?Past Medical History:  ?Diagnosis Date  ? Alcohol abuse   ? Sobriety since 1990  ? Anxiety   ? Arthritis   ? oa  ? Benign positional vertigo   ? Depression   ? Eardrum trauma   ? 30 years ago/right ear  ? GERD (gastroesophageal reflux disease)   ? Headache   ? sinus  ? Hepatitis C 1994  ? hep c - ? etiology; in [place] 1989-91; S/P interferon , Dr [name]  ? History of bronchitis   ? History of iron deficiency anemia   ? Hoarseness   ? Hyperlipidemia   ? Hypertension   ? Hypothyroidism   ? Obesity   ? OSA (obstructive sleep apnea)   ? on CPAP  ? PONV (postoperative nausea and vomiting)   ? following knee surgery last year   ? PTSD (post-traumatic stress disorder)   ? PTSD (post-traumatic stress disorder)   ? 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 Bilateral  ? Vocal cord polyps   ? ?Past Surgical History:  ?Procedure Laterality Date  ? ANTERIOR HIP REVISION Right 02/07/2016  ? Procedure: ANTERIOR HIP REVISION;  Surgeon: Paralee Cancel, MD;  [facility];  Service: Orthopedics;  Laterality: Right;  ? CARDIOVASCULAR STRESS TEST  02/09/2010  ? No scintigraphic evidence of inducible ischemia.  ? COLONOSCOPY  03/2013  ? HIP CLOSED REDUCTION Right 02/05/2016  ? Procedure: CLOSED MANIPULATION HIP;  Surgeon: Latanya Maudlin, MD;  [facility];  Service: Orthopedics;  Laterality: Right;  ? knee arthroscopic Bilateral   ? LARYNGOSCOPY  08/2009  ?  Dr.[name]  ? NASAL FRACTURE SURGERY    ? age 87  ? right knee arthroscopic knee surgery  12 yrs ago  ? dr Theda Sers  ? THROAT SURGERY  aug, sept, Nov 16 2016  ? vocal cord  laser sugery X 3, Dr [name] , [facility]  ? TOTAL HIP ARTHROPLASTY Right 02/01/2016  ? Procedure: RIGHT TOTAL HIP ARTHROPLASTY ANTERIOR APPROACH;  Surgeon: Paralee Cancel, MD;  [facility];  Service: Orthopedics;  Laterality: Right;  ? TOTAL HIP ARTHROPLASTY Left 12/19/2016  ? Procedure: LEFT TOTAL HIP ARTHROPLASTY ANTERIOR APPROACH;  Surgeon: Paralee Cancel, MD;  [facility];  Service: Orthopedics;  Laterality: Left;  ? TOTAL HIP REVISION Right 11/11/2018  ? Procedure: right total hip arthroplasty revision, femoral stem;  Surgeon: Paralee Cancel, MD;  [facility];  Service: Orthopedics;  Laterality: Right;  25mn  ? TOTAL KNEE ARTHROPLASTY Left 11/27/2014  ? Procedure: LEFT TOTAL KNEE ARTHROPLASTY;  Surgeon: SAugustin Schooling MD;  [place] [facility]  Service: Orthopedics;  Laterality: Left;  ? TOTAL KNEE ARTHROPLASTY Right 12/16/2019  ? Procedure: TOTAL KNEE ARTHROPLASTY;  Surgeon: OParalee Cancel MD;  [facility];  Service: Orthopedics;  Laterality: Right;  70 mins  ? TRANSTHORACIC ECHOCARDIOGRAM  11/08/2005  ? EF 68%, normal LV systolic function  ? UPPER GASTROINTESTINAL ENDOSCOPY  2010  ? Negative, Dr.[name]  ? ?Patient Active Problem List  ? Diagnosis Date Noted  ? Pneumonia due to COVID-19 virus 10/08/2020  ? Upper airway cough syndrome 10/08/2020  ? S/P right TKA 12/16/2019  ? Status post total knee replacement, right 12/16/2019  ? Hip fracture ([place]) 11/13/2018  ? Chronic anemia 11/06/2018  ? Reactive airway disease 11/06/2018  ? Femoral fracture ([place]) 11/05/2018  ? Morbid obesity ([place]) 12/20/2016  ? S/P left THA, AA 12/19/2016  ? Chest pain 08/15/2016  ? S/P right TH revision 02/09/2016  ? Hip instability 02/05/2016  ? H/O total hip arthroplasty 02/05/2016  ? S/P right THA, AA 02/01/2016  ? S/P knee replacement 11/27/2014  ? Unspecified  viral hepatitis C without hepatic coma 08/28/2014  ? DYSPHONIA, CHRONIC 01/06/2010  ? NONSPECIFIC ABNORMAL ELECTROCARDIOGRAM 01/06/2010  ? HOARSENESS, CHRONIC 05/31/2009  ? Hypothyroidism 04/09/2009  ? Hyperlipidemia LDL goal <100 04/09/2009  ? HTN (hypertension) 04/09/2009  ? GERD (gastroesophageal reflux disease) 04/09/2009  ? Sleep apnea 04/09/2009  ? ?  ?REFERRING PROVIDER: [place], Blondell Reveal, MD ?  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  ?THERAPY DIAG:  ?Muscle weakness (generalized) ?  ?Other abnormalities of gait and mobility ?  ?Radiculopathy, lumbar region ?  ?Difficulty in walking, not elsewhere classified ?  ?ONSET DATE: 12/30/21 ?  ?SUBJECTIVE:                                                                                                                                                                                          ?  ?SUBJECTIVE STATEMENT: ?Pt reports he was very sore for 2 days after last session, in his LLE.  He took it easy until Sunday following treatment.  When walking on land he has been trying to take smaller steps and tighten core with standing from sitting position. He bought trekking poles for some support while walking. They are supposed to arrive today.  ? ?PERTINENT HISTORY:  ?hypertension, OSA, , hyperlipidemia, chest pain, morbid obesity.,throat cancer chronic pain syndrome, degenerative changes in cervical and lumbar spine, PTSD, TBI, tinnitus, bilateral knee replacements, bilateral hip replacements, right hip replacement hardware failure and revision. ?  ?  ?PAIN:  ?Are you having pain? Yes ?NPRS scale: 6-7/10 ?Pain location: RLE hip / groin, low back ?Pain orientation: see above ?PAIN TYPE: aching, tight, and tingling, occasional jabbing ?  Pain description: constant  ?Aggravating factors: walking ?Relieving factors: sitting, resting ?   ?PRECAUTIONS: Back and Fall ?  ?WEIGHT BEARING RESTRICTIONS No ?  ?FALLS:  ?Has patient fallen in last 6 months? Yes, Number of falls: 2 ?  ?LIVING ENVIRONMENT: ?Lives with: lives with their family ?Lives in: House/apartment ?Stairs: Yes; Internal: 2 steps; none ?Has following equipment at home: Single point cane ?  ?OCCUPATION: manager/sits a desk ?  ?PLOF: Independent with basic ADLs ?  ?PATIENT GOALS walk straighter ?  ?  ?OBJECTIVE:  ? * All objective findings take at Evaluation unless otherwise noted.  ? ?DIAGNOSTIC FINDINGS:  ?Neg blood clots LE ?  ?PATIENT SURVEYS:  ?FOTO 61 with goal of 64 ?  ?SCREENING FOR RED FLAGS: ?          neg ?  ?COGNITION: ?         Overall cognitive status: Within functional limits for tasks assessed              ?          ?SENSATION: ?numbness, burning and tingling in bilateral feet. ?         Light touch: Deficits feet ?          ?  ?MUSCLE LENGTH: ?Hamstrings: Right 80 deg; Left 60 deg ?  ?  ?POSTURE:  ?Right hip elevation, flattened lumbar spine ?  ?PALPATION: ?Lt post innominate rotation ?  ?LUMBARAROM/PROM ?  ?A/PROM A/PROM  ?02/14/2022  ?Flexion 18 inches fingertips to floor  ?Extension -10  ?Right lateral flexion    ?Left lateral flexion    ?Right rotation    ?Left rotation    ? (Blank rows = not tested) ?  ?LE AROM/PROM: ?  ?A/PROM Right ?02/14/2022 Left ?02/14/2022  ?Hip flexion 90 110  ?Hip extension      ?Hip abduction      ?Hip adduction      ?Hip internal rotation      ?Hip external rotation      ?Knee flexion wfl wfl  ?Knee extension      ?Ankle dorsiflexion 5 30  ?Ankle plantarflexion      ?Ankle inversion      ?Ankle eversion      ? (Blank rows = not tested) ?  ?LE MMT: ?  ?MMT Right ?02/14/2022 Left ?02/14/2022  ?Hip flexion 22.7 47.78  ?Hip extension 27 31  ?Hip abduction 26.0 49.2  ?Hip adduction      ?Hip internal rotation      ?Hip external rotation      ?Knee flexion 35.6 43.5  ?Knee extension      ?Ankle dorsiflexion      ?Ankle plantarflexion      ?Ankle inversion       ?Ankle eversion      ? (Blank rows = not tested) ?  ?LUMBAR SPECIAL TESTS:  ?Straight leg raise test: Negative, Slump test: Negative, and Long sit test: Negative ?  ?FUNCTIONAL TESTS:  ?6 minute walk test 6

## 2022-03-28 ENCOUNTER — Ambulatory Visit (HOSPITAL_BASED_OUTPATIENT_CLINIC_OR_DEPARTMENT_OTHER): Payer: No Typology Code available for payment source | Admitting: Physical Therapy

## 2022-03-28 ENCOUNTER — Ambulatory Visit: Payer: No Typology Code available for payment source | Admitting: Internal Medicine

## 2022-03-28 ENCOUNTER — Encounter (HOSPITAL_BASED_OUTPATIENT_CLINIC_OR_DEPARTMENT_OTHER): Payer: Self-pay | Admitting: Physical Therapy

## 2022-03-28 DIAGNOSIS — M6281 Muscle weakness (generalized): Secondary | ICD-10-CM

## 2022-03-28 DIAGNOSIS — R2689 Other abnormalities of gait and mobility: Secondary | ICD-10-CM

## 2022-03-28 DIAGNOSIS — M25551 Pain in right hip: Secondary | ICD-10-CM

## 2022-03-28 DIAGNOSIS — R262 Difficulty in walking, not elsewhere classified: Secondary | ICD-10-CM

## 2022-03-28 DIAGNOSIS — M5416 Radiculopathy, lumbar region: Secondary | ICD-10-CM

## 2022-03-28 NOTE — Progress Notes (Deleted)
Cardiology Office Note:    Date:  03/28/2022   ID:  Samuel Goodwin, DOB 02-19-1961, MRN [id number]  PCP:  de [place], Raymond J, MD   [place] Prov[facility] Cardiologist:  Quay Burow, MD { Click to update primary MD,subspecialty MD or APP then REFRESH:1}    Referring MD: Lorretta Harp, MD   No chief complaint on file. ***  History of Present Illness:    Samuel Goodwin is a 61 y.o. male  [facility] with a hx of HTN, OSA, HLD, GERD, chronic hep C  Past Medical History:  Diagnosis Date   Alcohol abuse    Sobriety since 1990   Anxiety    Arthritis    oa   Benign positional vertigo    Depression    Eardrum trauma    30 years ago/right ear   GERD (gastroesophageal reflux disease)    Headache    sinus   Hepatitis C 1994   hep c - ? 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   COLONOSCOPY  03/2013   HIP CLOSED REDUCTION Right 02/05/2016   Procedure: CLOSED MANIPULATION HIP;  Surgeon: Latanya Maudlin, MD;  [facility];  Service: Orthopedics;  Laterality: Right;   knee arthroscopic Bilateral    LARYNGOSCOPY  08/2009   Dr.[name]   NASAL FRACTURE SURGERY     age 4   right knee  arthroscopic knee surgery  12 yrs ago   dr Theda Sers   THROAT SURGERY  aug, sept, Nov 16 2016   vocal cord  laser sugery X 3, Dr [name] , [facility]   TOTAL HIP ARTHROPLASTY Right 02/01/2016   Procedure: RIGHT TOTAL HIP ARTHROPLASTY ANTERIOR APPROACH;  Surgeon: Paralee Cancel, MD;  [facility];  Service: Orthopedics;  Laterality: Right;   TOTAL HIP ARTHROPLASTY Left 12/19/2016   Procedure: LEFT TOTAL HIP ARTHROPLASTY ANTERIOR APPROACH;  Surgeon: Paralee Cancel, MD;  [facility];  Service: Orthopedics;  Laterality: Left;   TOTAL HIP REVISION Right 11/11/2018   Procedure: right total hip arthroplasty revision, femoral stem;  Surgeon: Paralee Cancel, MD;  [facility];  Service: Orthopedics;  Laterality: Right;  80mn   TOTAL KNEE ARTHROPLASTY Left 11/27/2014   Procedure: LEFT TOTAL KNEE ARTHROPLASTY;  Surgeon: SAugustin Schooling MD;  [place]  Service: Orthopedics;  Laterality: Left;   TOTAL KNEE ARTHROPLASTY Right 12/16/2019   Procedure: TOTAL KNEE ARTHROPLASTY;  Surgeon: OParalee Cancel MD;  [facility];  Service: Orthopedics;  Laterality: Right;  70 mins   TRANSTHORACIC ECHOCARDIOGRAM  11/08/2005   EF 68%, normal LV systolic function   UPPER  GASTROINTESTINAL ENDOSCOPY  2010   Negative, Dr.[name]    Current Medications: No outpatient medications have been marked as taking for the 03/28/22 encounter (Appointment) with Janina Mayo, MD.     Allergies:   Dilaudid [hydromorphone hcl], Lipitor [atorvastatin], Pitavastatin, Simvastatin, and Benazepril hcl   Social History   Socioeconomic History   Marital status: Married    Spouse name: Levada Dy   Number of children: 3   Years of education: 12   Highest education level: Associate degree: occupational, Hotel manager, or vocational program  Occupational History   Not on file  Tobacco Use   Smoking status: Never   Smokeless tobacco: Never   Tobacco comments:    Quit at age 22  Vaping Use   Vaping Use: Never used  Substance and  Sexual Activity   Alcohol use: No    Comment: alcoholism quit 30 years ago    Drug use: No   Sexual activity: Yes  Other Topics Concern   Not on file  Social History Narrative   Lives with wife   No caffeine   Social Determinants of Radio broadcast assistant Strain: Not on file  Food Insecurity: Not on file  Transportation Needs: Not on file  Physical Activity: Not on file  Stress: Not on file  Social Connections: Not on file     Family History: The patient's family history includes Aneurysm in his maternal aunt; Cirrhosis in his father; Coronary artery disease in his maternal uncle; Diabetes in his father; Heart attack (age of onset: 87) in his brother; Heart failure in his mother; Hypertension in his mother; Stroke in his father; Thyroid disease in his mother.  ROS:   Please see the history of present illness.     All other systems reviewed and are negative.  EKGs/Labs/Other Studies Reviewed:    The following studies were reviewed today: ***  EKG:  EKG is *** ordered today.  The ekg ordered today demonstrates ***  Recent Labs: 12/21/2021: ALT 19; BUN 14; Creatinine, Ser 0.77; Hemoglobin 13.9; Platelets 278; Potassium 4.1; Sodium 137; TSH 0.989  Recent Lipid Panel    Component Value Date/Time   CHOL 207 (H) 08/03/2021 0905   TRIG 117 08/03/2021 0905   HDL 43 08/03/2021 0905   CHOLHDL 4.8 08/03/2021 0905   CHOLHDL 3.2 11/03/2014 0800   VLDL 19 11/03/2014 0800   LDLCALC 143 (H) 08/03/2021 0905     Risk Assessment/Calculations:   {Does this patient have ATRIAL FIBRILLATION?:[phone]}       Physical Exam:    VS:  There were no vitals taken for this visit.    Wt Readings from Last 3 Encounters:  01/20/22 296 lb (134.3 kg)  12/21/21 294 lb (133.4 kg)  08/05/21 292 lb 12.8 oz (132.8 kg)     GEN: *** Well nourished, well developed in no acute distress HEENT: Normal NECK: No JVD; No carotid bruits LYMPHATICS: No lymphadenopathy CARDIAC: ***RRR, no  murmurs, rubs, gallops RESPIRATORY:  Clear to auscultation without rales, wheezing or rhonchi  ABDOMEN: Soft, non-tender, non-distended MUSCULOSKELETAL:  No edema; No deformity  SKIN: Warm and dry NEUROLOGIC:  Alert and oriented x 3 PSYCHIATRIC:  Normal affect   ASSESSMENT:    No diagnosis found. PLAN:    In order of problems listed above:  ***      {Are you ordering a CV Procedure (e.g. stress test, cath, DCCV, TEE, etc)?   Press F2        :[id number]}    Medication  Adjustments/Labs and Tests Ordered: Current medicines are reviewed at length with the patient today.  Concerns regarding medicines are outlined above.  No orders of the defined types were placed in this encounter.  No orders of the defined types were placed in this encounter.   There are no Patient Instructions on file for this visit.   Signed, Janina Mayo, MD  03/28/2022 8:48 AM    [facility]

## 2022-03-28 NOTE — Therapy (Signed)
?OUTPATIENT PHYSICAL THERAPY TREATMENT NOTE ? ? ?Patient Name: Samuel Goodwin ?MRN: [id number] ?DOB:07-15-1961, 61 y.o., male ?Today's Date: 03/28/2022 ? ?PCP: de [place], Raymond J, MD ?REFERRING PROVIDER: [place], Raymond J, MD ? ? PT End of Session - 03/28/22 1618   ? ? Visit Number 7   ? Number of Visits 15   ? Date for PT Re-Evaluation 04/11/22   ? Authorization Type [facility]   ? Authorization Time Period 09/16/21-01/14/22   ? Authorization - Visit Number 6   ? Authorization - Number of Visits 15   ? Progress Note Due on Visit 10   ? PT Start Time 1615   ? PT Stop Time 1700   ? PT Time Calculation (min) 45 min   ? ?  ?  ? ?  ? ? ? ?Past Medical History:  ?Diagnosis Date  ? Alcohol abuse   ? Sobriety since 1990  ? Anxiety   ? Arthritis   ? oa  ? Benign positional vertigo   ? Depression   ? Eardrum trauma   ? 30 years ago/right ear  ? GERD (gastroesophageal reflux disease)   ? Headache   ? sinus  ? Hepatitis C 1994  ? hep c - ? etiology; in [place] 1989-91; S/P interferon , Dr [name]  ? History of bronchitis   ? History of iron deficiency anemia   ? Hoarseness   ? Hyperlipidemia   ? Hypertension   ? Hypothyroidism   ? Obesity   ? OSA (obstructive sleep apnea)   ? on CPAP  ? PONV (postoperative nausea and vomiting)   ? following knee surgery last year   ? PTSD (post-traumatic stress disorder)   ? PTSD (post-traumatic stress disorder)   ? 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 Bilateral  ? Vocal cord polyps   ? ?Past Surgical History:  ?Procedure Laterality Date  ? ANTERIOR HIP REVISION Right 02/07/2016  ? Procedure: ANTERIOR HIP REVISION;  Surgeon: Paralee Cancel, MD;  Location: [facility];  Service: Orthopedics;  Laterality: Right;  ? CARDIOVASCULAR STRESS TEST  02/09/2010  ? No scintigraphic evidence of inducible ischemia.  ? COLONOSCOPY  03/2013  ? HIP CLOSED REDUCTION Right 02/05/2016  ? Procedure: CLOSED MANIPULATION HIP;  Surgeon: Latanya Maudlin, MD;  Location: [facility];  Service: Orthopedics;   Laterality: Right;  ? knee arthroscopic Bilateral   ? LARYNGOSCOPY  08/2009  ? Dr.[name]  ? NASAL FRACTURE SURGERY    ? age 33  ? right knee arthroscopic knee surgery  12 yrs ago  ? dr Theda Sers  ? THROAT SURGERY  aug, sept, Nov 16 2016  ? vocal cord  laser sugery X 3, Dr [name] , [facility]  ? TOTAL HIP ARTHROPLASTY Right 02/01/2016  ? Procedure: RIGHT TOTAL HIP ARTHROPLASTY ANTERIOR APPROACH;  Surgeon: Paralee Cancel, MD;  Location: [facility];  Service: Orthopedics;  Laterality: Right;  ? TOTAL HIP ARTHROPLASTY Left 12/19/2016  ? Procedure: LEFT TOTAL HIP ARTHROPLASTY ANTERIOR APPROACH;  Surgeon: Paralee Cancel, MD;  Location: [facility];  Service: Orthopedics;  Laterality: Left;  ? TOTAL HIP REVISION Right 11/11/2018  ? Procedure: right total hip arthroplasty revision, femoral stem;  Surgeon: Paralee Cancel, MD;  Location: [facility];  Service: Orthopedics;  Laterality: Right;  16mn  ? TOTAL KNEE ARTHROPLASTY Left 11/27/2014  ? Procedure: LEFT TOTAL KNEE ARTHROPLASTY;  Surgeon: SAugustin Schooling MD;  Location: [facility]  Service: Orthopedics;  Laterality: Left;  ? TOTAL KNEE ARTHROPLASTY  Right 12/16/2019  ? Procedure: TOTAL KNEE ARTHROPLASTY;  Surgeon: Paralee Cancel, MD;  Location: [facility];  Service: Orthopedics;  Laterality: Right;  70 mins  ? TRANSTHORACIC ECHOCARDIOGRAM  11/08/2005  ? EF 68%, normal LV systolic function  ? UPPER GASTROINTESTINAL ENDOSCOPY  2010  ? Negative, Dr.[name]  ? ?Patient Active Problem List  ? Diagnosis Date Noted  ? Pneumonia due to COVID-19 virus 10/08/2020  ? Upper airway cough syndrome 10/08/2020  ? S/P right TKA 12/16/2019  ? Status post total knee replacement, right 12/16/2019  ? Hip fracture ([place]) 11/13/2018  ? Chronic anemia 11/06/2018  ? Reactive airway disease 11/06/2018  ? Femoral fracture ([place]) 11/05/2018  ? Morbid obesity ([place]) 12/20/2016  ? S/P left THA, AA 12/19/2016  ? Chest pain 08/15/2016  ? S/P right TH revision 02/09/2016  ? Hip instability 02/05/2016  ? H/O total hip arthroplasty 02/05/2016  ? S/P  right THA, AA 02/01/2016  ? S/P knee replacement 11/27/2014  ? Unspecified viral hepatitis C without hepatic coma 08/28/2014  ? DYSPHONIA, CHRONIC 01/06/2010  ? NONSPECIFIC ABNORMAL ELECTROCARDIOGRAM 01/06/2010  ? HOARSENESS, CHRONIC 05/31/2009  ? Hypothyroidism 04/09/2009  ? Hyperlipidemia LDL goal <100 04/09/2009  ? HTN (hypertension) 04/09/2009  ? GERD (gastroesophageal reflux disease) 04/09/2009  ? Sleep apnea 04/09/2009  ? ?  ?REFERRING PROVIDER: [place], Blondell Reveal, MD ?  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  ?THERAPY DIAG:  ?Muscle weakness (generalized) ?  ?Other abnormalities of gait and mobility ?  ?Radiculopathy, lumbar region ?  ?Difficulty in walking, not elsewhere classified ?  ?ONSET DATE: 12/30/21 ?  ?SUBJECTIVE:                                                                                                                                                                                          ?  ?SUBJECTIVE STATEMENT: ?Felt ok after last session.  Pt reports he just is not tolerating exercise as well as he has in the past. Does less walking and rests more ?PERTINENT HISTORY:  ?hypertension, OSA, , hyperlipidemia, chest pain, morbid obesity.,throat cancer chronic pain syndrome, degenerative changes in cervical and lumbar spine, PTSD, TBI, tinnitus, bilateral knee replacements, bilateral hip replacements, right hip replacement hardware failure and revision. ?  ?  ?PAIN:  ?Are you having pain? Yes ?NPRS scale: 6-7/10 ?Pain location: RLE hip / groin, low back ?Pain orientation: see above ?PAIN TYPE: aching, tight, and tingling, occasional jabbing ?Pain description: constant  ?Aggravating factors: walking ?Relieving factors: sitting, resting ?  ?PRECAUTIONS: Back and Fall ?  ?  WEIGHT BEARING RESTRICTIONS No ?  ?FALLS:  ?Has patient fallen in last 6 months? Yes, Number  of falls: 2 ?  ?LIVING ENVIRONMENT: ?Lives with: lives with their family ?Lives in: House/apartment ?Stairs: Yes; Internal: 2 steps; none ?Has following equipment at home: Single point cane ?  ?OCCUPATION: manager/sits a desk ?  ?PLOF: Independent with basic ADLs ?  ?PATIENT GOALS walk straighter ?  ?  ?OBJECTIVE:  ? * All objective findings take at Evaluation unless otherwise noted.  ? ?DIAGNOSTIC FINDINGS:  ?Neg blood clots LE ?  ?PATIENT SURVEYS:  ?FOTO 53 with goal of 59 ?  ?SCREENING FOR RED FLAGS: ?          neg ?  ?COGNITION: ?         Overall cognitive status: Within functional limits for tasks assessed              ?          ?SENSATION: ?numbness, burning and tingling in bilateral feet. ?         Light touch: Deficits feet ?          ?  ?MUSCLE LENGTH: ?Hamstrings: Right 80 deg; Left 60 deg ?  ?  ?POSTURE:  ?Right hip elevation, flattened lumbar spine ?  ?PALPATION: ?Lt post innominate rotation ?  ?LUMBARAROM/PROM ?  ?A/PROM A/PROM  ?02/14/2022  ?Flexion 18 inches fingertips to floor  ?Extension -10  ?Right lateral flexion    ?Left lateral flexion    ?Right rotation    ?Left rotation    ? (Blank rows = not tested) ?  ?LE AROM/PROM: ?  ?A/PROM Right ?02/14/2022 Left ?02/14/2022  ?Hip flexion 90 110  ?Hip extension      ?Hip abduction      ?Hip adduction      ?Hip internal rotation      ?Hip external rotation      ?Knee flexion wfl wfl  ?Knee extension      ?Ankle dorsiflexion 5 30  ?Ankle plantarflexion      ?Ankle inversion      ?Ankle eversion      ? (Blank rows = not tested) ?  ?LE MMT: ?  ?MMT Right ?02/14/2022 Left ?02/14/2022  ?Hip flexion 22.7 47.78  ?Hip extension 27 31  ?Hip abduction 26.0 49.2  ?Hip adduction      ?Hip internal rotation      ?Hip external rotation      ?Knee flexion 35.6 43.5  ?Knee extension      ?Ankle dorsiflexion      ?Ankle plantarflexion      ?Ankle inversion      ?Ankle eversion      ? (Blank rows = not tested) ?  ?LUMBAR SPECIAL TESTS:  ?Straight leg raise test: Negative, Slump  test: Negative, and Long sit test: Negative ?  ?FUNCTIONAL TESTS:  ?6 minute walk test 655 ?5x STS test  17s ?  ?GAIT: ?Distance walked: 655 ?Assistive device utilized: None ?Level of assistance: Complet

## 2022-03-30 ENCOUNTER — Ambulatory Visit (HOSPITAL_BASED_OUTPATIENT_CLINIC_OR_DEPARTMENT_OTHER): Payer: No Typology Code available for payment source | Admitting: Physical Therapy

## 2022-03-30 ENCOUNTER — Encounter (HOSPITAL_BASED_OUTPATIENT_CLINIC_OR_DEPARTMENT_OTHER): Payer: Self-pay | Admitting: Physical Therapy

## 2022-03-30 DIAGNOSIS — R2689 Other abnormalities of gait and mobility: Secondary | ICD-10-CM

## 2022-03-30 DIAGNOSIS — M5416 Radiculopathy, lumbar region: Secondary | ICD-10-CM

## 2022-03-30 DIAGNOSIS — M6281 Muscle weakness (generalized): Secondary | ICD-10-CM | POA: Diagnosis not present

## 2022-03-30 DIAGNOSIS — R262 Difficulty in walking, not elsewhere classified: Secondary | ICD-10-CM

## 2022-03-30 NOTE — Therapy (Signed)
?OUTPATIENT PHYSICAL THERAPY TREATMENT NOTE ? ? ?Patient Name: Samuel Goodwin ?MRN: [id number] ?DOB:December 24, 1960, 61 y.o., male ?Today's Date: 03/30/2022 ? ?PCP: [place], Raymond J, MD ?REFERRING PROVIDER: [place], Raymond J, MD ? ? PT End of Session - 03/30/22 1629   ? ? Visit Number 8   ? Number of Visits 15   ? Date for PT Re-Evaluation 04/11/22   ? Authorization Type [facility]   ? Authorization Time Period 09/16/21-01/14/22   ? Authorization - Visit Number 8   ? Authorization - Number of Visits 15   ? Progress Note Due on Visit 10   ? PT Start Time 1619   ? PT Stop Time 1700   ? PT Time Calculation (min) 41 min   ? ?  ?  ? ?  ? ? ? ?Past Medical History:  ?Diagnosis Date  ? Alcohol abuse   ? Sobriety since 1990  ? Anxiety   ? Arthritis   ? oa  ? Benign positional vertigo   ? Depression   ? Eardrum trauma   ? 30 years ago/right ear  ? GERD (gastroesophageal reflux disease)   ? Headache   ? sinus  ? Hepatitis C 1994  ? hep c - ? etiology; in [place] 1989-91; S/P interferon , Dr [name]  ? History of bronchitis   ? History of iron deficiency anemia   ? Hoarseness   ? Hyperlipidemia   ? Hypertension   ? Hypothyroidism   ? Obesity   ? OSA (obstructive sleep apnea)   ? on CPAP  ? PONV (postoperative nausea and vomiting)   ? following knee surgery last year   ? PTSD (post-traumatic stress disorder)   ? PTSD (post-traumatic stress disorder)   ? 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 Bilateral  ? Vocal cord polyps   ? ?Past Surgical History:  ?Procedure Laterality Date  ? ANTERIOR HIP REVISION Right 02/07/2016  ? Procedure: ANTERIOR HIP REVISION;  Surgeon: Paralee Cancel, MD;  Location: [facility];  Service: Orthopedics;  Laterality: Right;  ? CARDIOVASCULAR STRESS TEST  02/09/2010  ? No scintigraphic evidence of inducible ischemia.  ? COLONOSCOPY  03/2013  ? HIP CLOSED REDUCTION Right 02/05/2016  ? Procedure: CLOSED MANIPULATION HIP;  Surgeon: Latanya Maudlin, MD;  Location: [facility];  Service: Orthopedics;   Laterality: Right;  ? knee arthroscopic Bilateral   ? LARYNGOSCOPY  08/2009  ? Dr.[name]  ? NASAL FRACTURE SURGERY    ? age 11  ? right knee arthroscopic knee surgery  12 yrs ago  ? dr Theda Sers  ? THROAT SURGERY  aug, sept, Nov 16 2016  ? vocal cord  laser sugery X 3, Dr [name] , [facility]  ? TOTAL HIP ARTHROPLASTY Right 02/01/2016  ? Procedure: RIGHT TOTAL HIP ARTHROPLASTY ANTERIOR APPROACH;  Surgeon: Paralee Cancel, MD;  Location: [facility];  Service: Orthopedics;  Laterality: Right;  ? TOTAL HIP ARTHROPLASTY Left 12/19/2016  ? Procedure: LEFT TOTAL HIP ARTHROPLASTY ANTERIOR APPROACH;  Surgeon: Paralee Cancel, MD;  Location: [facility];  Service: Orthopedics;  Laterality: Left;  ? TOTAL HIP REVISION Right 11/11/2018  ? Procedure: right total hip arthroplasty revision, femoral stem;  Surgeon: Paralee Cancel, MD;  Location: [facility];  Service: Orthopedics;  Laterality: Right;  62mn  ? TOTAL KNEE ARTHROPLASTY Left 11/27/2014  ? Procedure: LEFT TOTAL KNEE ARTHROPLASTY;  Surgeon: SAugustin Schooling MD;  Location: [facility]  Service: Orthopedics;  Laterality: Left;  ? TOTAL KNEE ARTHROPLASTY  Right 12/16/2019  ? Procedure: TOTAL KNEE ARTHROPLASTY;  Surgeon: Paralee Cancel, MD;  Location: [facility];  Service: Orthopedics;  Laterality: Right;  70 mins  ? TRANSTHORACIC ECHOCARDIOGRAM  11/08/2005  ? EF 68%, normal LV systolic function  ? UPPER GASTROINTESTINAL ENDOSCOPY  2010  ? Negative, Dr.[name]  ? ?Patient Active Problem List  ? Diagnosis Date Noted  ? Pneumonia due to COVID-19 virus 10/08/2020  ? Upper airway cough syndrome 10/08/2020  ? S/P right TKA 12/16/2019  ? Status post total knee replacement, right 12/16/2019  ? Hip fracture ([place]) 11/13/2018  ? Chronic anemia 11/06/2018  ? Reactive airway disease 11/06/2018  ? Femoral fracture ([place]) 11/05/2018  ? Morbid obesity ([place]) 12/20/2016  ? S/P left THA, AA 12/19/2016  ? Chest pain 08/15/2016  ? S/P right TH revision 02/09/2016  ? Hip instability 02/05/2016  ? H/O total hip arthroplasty 02/05/2016  ? S/P  right THA, AA 02/01/2016  ? S/P knee replacement 11/27/2014  ? Unspecified viral hepatitis C without hepatic coma 08/28/2014  ? DYSPHONIA, CHRONIC 01/06/2010  ? NONSPECIFIC ABNORMAL ELECTROCARDIOGRAM 01/06/2010  ? HOARSENESS, CHRONIC 05/31/2009  ? Hypothyroidism 04/09/2009  ? Hyperlipidemia LDL goal <100 04/09/2009  ? HTN (hypertension) 04/09/2009  ? GERD (gastroesophageal reflux disease) 04/09/2009  ? Sleep apnea 04/09/2009  ? ?  ?REFERRING PROVIDER: [place], Blondell Reveal, MD ?  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  ?THERAPY DIAG:  ?Muscle weakness (generalized) ?  ?Other abnormalities of gait and mobility ?  ?Radiculopathy, lumbar region ?  ?Difficulty in walking, not elsewhere classified ?  ?ONSET DATE: 12/30/21 ?  ?SUBJECTIVE:                                                                                                                                                                                          ?  ?SUBJECTIVE STATEMENT: ?"Today my legs feel stronger than Tuesday".    He didn't sleep well last night, had burning pain in Lt lower back.  ?PERTINENT HISTORY:  ?hypertension, OSA, , hyperlipidemia, chest pain, morbid obesity.,throat cancer, chronic pain syndrome, degenerative changes in cervical and lumbar spine, PTSD, TBI, tinnitus, bilateral knee replacements, bilateral hip replacements, right hip replacement hardware failure and revision. ?  ?  ?PAIN:  ?Are you having pain? Yes ?NPRS scale: 6-7/10 ?Pain location: RLE hip / groin, low back ?Pain orientation: see above ?PAIN TYPE: aching, tight, and tingling, occasional jabbing ?Pain description: constant  ?Aggravating factors: walking ?Relieving factors: sitting, resting ?  ?PRECAUTIONS: Back and Fall ?  ?WEIGHT BEARING RESTRICTIONS  No ?  ?FALLS:  ?Has patient fallen in last 6 months? Yes, Number of falls: 2 ?   ?LIVING ENVIRONMENT: ?Lives with: lives with their family ?Lives in: House/apartment ?Stairs: Yes; Internal: 2 steps; none ?Has following equipment at home: Single point cane ?  ?OCCUPATION: manager/sits a desk ?  ?PLOF: Independent with basic ADLs ?  ?PATIENT GOALS walk straighter ?  ?  ?OBJECTIVE:  ? * All objective findings take at Evaluation unless otherwise noted.  ? ?DIAGNOSTIC FINDINGS:  ?Neg blood clots LE ?  ?PATIENT SURVEYS:  ?FOTO 61 with goal of 55 ?  ?SCREENING FOR RED FLAGS: ?          neg ?  ?COGNITION: ?         Overall cognitive status: Within functional limits for tasks assessed              ?          ?SENSATION: ?numbness, burning and tingling in bilateral feet. ?         Light touch: Deficits feet ?          ?  ?MUSCLE LENGTH: ?Hamstrings: Right 80 deg; Left 60 deg ?  ?  ?POSTURE:  ?Right hip elevation, flattened lumbar spine ?  ?PALPATION: ?Lt post innominate rotation ?  ?LUMBARAROM/PROM ?  ?A/PROM A/PROM  ?02/14/2022  ?Flexion 18 inches fingertips to floor  ?Extension -10  ?Right lateral flexion    ?Left lateral flexion    ?Right rotation    ?Left rotation    ? (Blank rows = not tested) ?  ?LE AROM/PROM: ?  ?A/PROM Right ?02/14/2022 Left ?02/14/2022  ?Hip flexion 90 110  ?Hip extension      ?Hip abduction      ?Hip adduction      ?Hip internal rotation      ?Hip external rotation      ?Knee flexion wfl wfl  ?Knee extension      ?Ankle dorsiflexion 5 30  ?Ankle plantarflexion      ?Ankle inversion      ?Ankle eversion      ? (Blank rows = not tested) ?  ?LE MMT: ?  ?MMT Right ?02/14/2022 Left ?02/14/2022  ?Hip flexion 22.7 47.78  ?Hip extension 27 31  ?Hip abduction 26.0 49.2  ?Hip adduction      ?Hip internal rotation      ?Hip external rotation      ?Knee flexion 35.6 43.5  ?Knee extension      ?Ankle dorsiflexion      ?Ankle plantarflexion      ?Ankle inversion      ?Ankle eversion      ? (Blank rows = not tested) ?  ?LUMBAR SPECIAL TESTS:  ?Straight leg raise test: Negative, Slump test: Negative,  and Long sit test: Negative ?  ?FUNCTIONAL TESTS:  ?6 minute walk test 655 ?5x STS test  17s ?  ?GAIT: ?Distance walked: 655 ?Assistive device utilized: None ?Level of assistance: Complete Independence ?Commen

## 2022-04-03 ENCOUNTER — Encounter: Payer: Self-pay | Admitting: Internal Medicine

## 2022-04-04 ENCOUNTER — Encounter (HOSPITAL_BASED_OUTPATIENT_CLINIC_OR_DEPARTMENT_OTHER): Payer: Self-pay | Admitting: Physical Therapy

## 2022-04-04 ENCOUNTER — Ambulatory Visit (HOSPITAL_BASED_OUTPATIENT_CLINIC_OR_DEPARTMENT_OTHER): Payer: No Typology Code available for payment source | Admitting: Physical Therapy

## 2022-04-04 DIAGNOSIS — M6281 Muscle weakness (generalized): Secondary | ICD-10-CM | POA: Diagnosis not present

## 2022-04-04 DIAGNOSIS — M25551 Pain in right hip: Secondary | ICD-10-CM

## 2022-04-04 DIAGNOSIS — R2689 Other abnormalities of gait and mobility: Secondary | ICD-10-CM

## 2022-04-04 DIAGNOSIS — R262 Difficulty in walking, not elsewhere classified: Secondary | ICD-10-CM

## 2022-04-04 DIAGNOSIS — M5416 Radiculopathy, lumbar region: Secondary | ICD-10-CM

## 2022-04-04 NOTE — Therapy (Signed)
?OUTPATIENT PHYSICAL THERAPY TREATMENT NOTE ? ? ?Patient Name: Samuel Goodwin ?MRN: [id number] ?DOB:17-Jan-1961, 61 y.o., male ?Today's Date: 04/04/2022 ? ?PCP: de [place], Raymond J, MD ?REFERRING PROVIDER: [place], Raymond J, MD ? ? PT End of Session - 04/04/22 1617   ? ? Visit Number 9   ? Number of Visits 15   ? Date for PT Re-Evaluation 04/11/22   ? Authorization Type [facility]   ? PT Start Time [phone]   ? Equipment Utilized During Treatment Other (comment)   ? Activity Tolerance Patient tolerated treatment well   ? Behavior During Therapy [facility] for tasks assessed/performed   ? ?  ?  ? ?  ? ? ? ?Past Medical History:  ?Diagnosis Date  ? Alcohol abuse   ? Sobriety since 1990  ? Anxiety   ? Arthritis   ? oa  ? Benign positional vertigo   ? Depression   ? Eardrum trauma   ? 30 years ago/right ear  ? GERD (gastroesophageal reflux disease)   ? Headache   ? sinus  ? Hepatitis C 1994  ? hep c - ? etiology; in [place] 1989-91; S/P interferon , Dr [name]  ? History of bronchitis   ? History of iron deficiency anemia   ? Hoarseness   ? Hyperlipidemia   ? Hypertension   ? Hypothyroidism   ? Obesity   ? OSA (obstructive sleep apnea)   ? on CPAP  ? PONV (postoperative nausea and vomiting)   ? following knee surgery last year   ? PTSD (post-traumatic stress disorder)   ? PTSD (post-traumatic stress disorder)   ? 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 Bilateral  ? Vocal cord polyps   ? ?Past Surgical History:  ?Procedure Laterality Date  ? ANTERIOR HIP REVISION Right 02/07/2016  ? Procedure: ANTERIOR HIP REVISION;  Surgeon: Paralee Cancel, MD;  Location: [facility];  Service: Orthopedics;  Laterality: Right;  ? CARDIOVASCULAR STRESS TEST  02/09/2010  ? No scintigraphic evidence of inducible ischemia.  ? COLONOSCOPY  03/2013  ? HIP CLOSED REDUCTION Right 02/05/2016  ? Procedure: CLOSED MANIPULATION HIP;  Surgeon: Latanya Maudlin, MD;  Location: [facility];  Service: Orthopedics;  Laterality: Right;  ? knee arthroscopic  Bilateral   ? LARYNGOSCOPY  08/2009  ? Dr.[name]  ? NASAL FRACTURE SURGERY    ? age 75  ? right knee arthroscopic knee surgery  12 yrs ago  ? dr Theda Sers  ? THROAT SURGERY  aug, sept, Nov 16 2016  ? vocal cord  laser sugery X 3, Dr [name] , [facility]  ? TOTAL HIP ARTHROPLASTY Right 02/01/2016  ? Procedure: RIGHT TOTAL HIP ARTHROPLASTY ANTERIOR APPROACH;  Surgeon: Paralee Cancel, MD;  Location: [facility];  Service: Orthopedics;  Laterality: Right;  ? TOTAL HIP ARTHROPLASTY Left 12/19/2016  ? Procedure: LEFT TOTAL HIP ARTHROPLASTY ANTERIOR APPROACH;  Surgeon: Paralee Cancel, MD;  Location: [facility];  Service: Orthopedics;  Laterality: Left;  ? TOTAL HIP REVISION Right 11/11/2018  ? Procedure: right total hip arthroplasty revision, femoral stem;  Surgeon: Paralee Cancel, MD;  Location: [facility];  Service: Orthopedics;  Laterality: Right;  30mn  ? TOTAL KNEE ARTHROPLASTY Left 11/27/2014  ? Procedure: LEFT TOTAL KNEE ARTHROPLASTY;  Surgeon: SAugustin Schooling MD;  [place] [facility]  Service: Orthopedics;  Laterality: Left;  ? TOTAL KNEE ARTHROPLASTY Right 12/16/2019  ? Procedure: TOTAL KNEE ARTHROPLASTY;  Surgeon: OParalee Cancel MD;  [facility]: [facility];  Service: Orthopedics;  Laterality: Right;  70 mins  ? TRANSTHORACIC ECHOCARDIOGRAM  11/08/2005  ? EF 68%, normal LV systolic function  ? UPPER GASTROINTESTINAL ENDOSCOPY  2010  ? Negative, Dr.[name]  ? ?Patient Active Problem List  ? Diagnosis Date Noted  ? Pneumonia due to COVID-19 virus 10/08/2020  ? Upper airway cough syndrome 10/08/2020  ? S/P right TKA 12/16/2019  ? Status post total knee replacement, right 12/16/2019  ? Hip fracture ([place]) 11/13/2018  ? Chronic anemia 11/06/2018  ? Reactive airway disease 11/06/2018  ? Femoral fracture ([place]) 11/05/2018  ? Morbid obesity ([place]) 12/20/2016  ? S/P left THA, AA 12/19/2016  ? Chest pain 08/15/2016  ? S/P right TH revision 02/09/2016  ? Hip instability 02/05/2016  ? H/O total hip arthroplasty 02/05/2016  ? S/P right THA, AA 02/01/2016  ? S/P knee  replacement 11/27/2014  ? Unspecified viral hepatitis C without hepatic coma 08/28/2014  ? DYSPHONIA, CHRONIC 01/06/2010  ? NONSPECIFIC ABNORMAL ELECTROCARDIOGRAM 01/06/2010  ? HOARSENESS, CHRONIC 05/31/2009  ? Hypothyroidism 04/09/2009  ? Hyperlipidemia LDL goal <100 04/09/2009  ? HTN (hypertension) 04/09/2009  ? GERD (gastroesophageal reflux disease) 04/09/2009  ? Sleep apnea 04/09/2009  ? ?  ?REFERRING PROVIDER: [place], Blondell Reveal, MD ?  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  ?THERAPY DIAG:  ?Muscle weakness (generalized) ?  ?Other abnormalities of gait and mobility ?  ?Radiculopathy, lumbar region ?  ?Difficulty in walking, not elsewhere classified ?  ?ONSET DATE: 12/30/21 ?  ?SUBJECTIVE:                                                                                                                                                                                          ?  ?SUBJECTIVE STATEMENT: ?"The last week and a half I have felt my best since I came back" ?PERTINENT HISTORY:  ?hypertension, OSA, , hyperlipidemia, chest pain, morbid obesity.,throat cancer, chronic pain syndrome, degenerative changes in cervical and lumbar spine, PTSD, TBI, tinnitus, bilateral knee replacements, bilateral hip replacements, right hip replacement hardware failure and revision. ?  ?  ?PAIN:  ?Are you having pain? Yes ?NPRS scale: 5/10 ?Pain location: RLE hip / groin, low back ?Pain orientation: see above ?PAIN TYPE: aching, tight, and tingling, occasional jabbing ?Pain description: constant  ?Aggravating factors: walking ?Relieving factors: sitting, resting ?  ?PRECAUTIONS: Back and Fall ?  ?WEIGHT BEARING RESTRICTIONS No ?  ?FALLS:  ?Has patient fallen in last 6 months? Yes, Number of falls: 2 ?  ?LIVING ENVIRONMENT: ?Lives with: lives with their family ?Lives in: House/apartment ?  Stairs:  Yes; Internal: 2 steps; none ?Has following equipment at home: Single point cane ?  ?OCCUPATION: manager/sits a desk ?  ?PLOF: Independent with basic ADLs ?  ?PATIENT GOALS walk straighter ?  ?  ?OBJECTIVE:  ? * All objective findings take at Evaluation unless otherwise noted.  ? ?DIAGNOSTIC FINDINGS:  ?Neg blood clots LE ?  ?PATIENT SURVEYS:  ?FOTO 57 with goal of 93 ?  ?SCREENING FOR RED FLAGS: ?          neg ?  ?COGNITION: ?         Overall cognitive status: Within functional limits for tasks assessed              ?          ?SENSATION: ?numbness, burning and tingling in bilateral feet. ?         Light touch: Deficits feet ?          ?  ?MUSCLE LENGTH: ?Hamstrings: Right 80 deg; Left 60 deg ?  ?  ?POSTURE:  ?Right hip elevation, flattened lumbar spine ?  ?PALPATION: ?Lt post innominate rotation ?  ?LUMBARAROM/PROM ?  ?A/PROM A/PROM  ?02/14/2022  ?Flexion 18 inches fingertips to floor  ?Extension -10  ?Right lateral flexion    ?Left lateral flexion    ?Right rotation    ?Left rotation    ? (Blank rows = not tested) ?  ?LE AROM/PROM: ?  ?A/PROM Right ?02/14/2022 Left ?02/14/2022  ?Hip flexion 90 110  ?Hip extension      ?Hip abduction      ?Hip adduction      ?Hip internal rotation      ?Hip external rotation      ?Knee flexion wfl wfl  ?Knee extension      ?Ankle dorsiflexion 5 30  ?Ankle plantarflexion      ?Ankle inversion      ?Ankle eversion      ? (Blank rows = not tested) ?  ?LE MMT: ?  ?MMT Right ?02/14/2022 Left ?02/14/2022  ?Hip flexion 22.7 47.78  ?Hip extension 27 31  ?Hip abduction 26.0 49.2  ?Hip adduction      ?Hip internal rotation      ?Hip external rotation      ?Knee flexion 35.6 43.5  ?Knee extension      ?Ankle dorsiflexion      ?Ankle plantarflexion      ?Ankle inversion      ?Ankle eversion      ? (Blank rows = not tested) ?  ?LUMBAR SPECIAL TESTS:  ?Straight leg raise test: Negative, Slump test: Negative, and Long sit test: Negative ?  ?FUNCTIONAL TESTS:  ?6 minute walk test 655 ?5x STS test  17s ?   ?GAIT: ?Distance walked: 655 ?Assistive device utilized: None ?Level of assistance: Complete Independence ?Comments: antalgic, significant lateral displacement increasing as distance increases, left [place]

## 2022-04-05 DIAGNOSIS — R262 Difficulty in walking, not elsewhere classified: Secondary | ICD-10-CM | POA: Diagnosis present

## 2022-04-05 DIAGNOSIS — M6281 Muscle weakness (generalized): Secondary | ICD-10-CM | POA: Diagnosis present

## 2022-04-05 DIAGNOSIS — R2689 Other abnormalities of gait and mobility: Secondary | ICD-10-CM | POA: Diagnosis present

## 2022-04-05 DIAGNOSIS — M5416 Radiculopathy, lumbar region: Secondary | ICD-10-CM | POA: Diagnosis present

## 2022-04-05 DIAGNOSIS — M25551 Pain in right hip: Secondary | ICD-10-CM | POA: Diagnosis present

## 2022-04-06 ENCOUNTER — Encounter (HOSPITAL_BASED_OUTPATIENT_CLINIC_OR_DEPARTMENT_OTHER): Payer: Self-pay | Admitting: Physical Therapy

## 2022-04-06 ENCOUNTER — Ambulatory Visit (HOSPITAL_BASED_OUTPATIENT_CLINIC_OR_DEPARTMENT_OTHER): Payer: No Typology Code available for payment source | Admitting: Physical Therapy

## 2022-04-06 DIAGNOSIS — M6281 Muscle weakness (generalized): Secondary | ICD-10-CM

## 2022-04-06 DIAGNOSIS — M5416 Radiculopathy, lumbar region: Secondary | ICD-10-CM

## 2022-04-06 DIAGNOSIS — R2689 Other abnormalities of gait and mobility: Secondary | ICD-10-CM

## 2022-04-06 NOTE — Therapy (Signed)
?OUTPATIENT PHYSICAL THERAPY TREATMENT NOTE ? ? ?Patient Name: Samuel Goodwin ?MRN: [id number] ?DOB:1961/07/18, 61 y.o., male ?Today's Date: 04/06/2022 ? ?PCP: [place], Raymond J, MD ?REFERRING PROVIDER: [place], Raymond J, MD ? ? PT End of Session - 04/06/22 1622   ? ? Visit Number 10   ? Number of Visits 15   ? Date for PT Re-Evaluation 04/11/22   ? Authorization Type [facility]   ? PT Start Time 1616   ? PT Stop Time 1655   ? PT Time Calculation (min) 39 min   ? Equipment Utilized During Treatment Other (comment)   ? Activity Tolerance Patient tolerated treatment well   ? Behavior During Therapy [facility] for tasks assessed/performed   ? ?  ?  ? ?  ? ? ? ?Past Medical History:  ?Diagnosis Date  ? Alcohol abuse   ? Sobriety since 1990  ? Anxiety   ? Arthritis   ? oa  ? Benign positional vertigo   ? Depression   ? Eardrum trauma   ? 30 years ago/right ear  ? GERD (gastroesophageal reflux disease)   ? Headache   ? sinus  ? Hepatitis C 1994  ? hep c - ? etiology; in [place] 1989-91; S/P interferon , Dr [name]  ? History of bronchitis   ? History of iron deficiency anemia   ? Hoarseness   ? Hyperlipidemia   ? Hypertension   ? Hypothyroidism   ? Obesity   ? OSA (obstructive sleep apnea)   ? on CPAP  ? PONV (postoperative nausea and vomiting)   ? following knee surgery last year   ? PTSD (post-traumatic stress disorder)   ? PTSD (post-traumatic stress disorder)   ? 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 Bilateral  ? Vocal cord polyps   ? ?Past Surgical History:  ?Procedure Laterality Date  ? ANTERIOR HIP REVISION Right 02/07/2016  ? Procedure: ANTERIOR HIP REVISION;  Surgeon: Paralee Cancel, MD;  [facility];  Service: Orthopedics;  Laterality: Right;  ? CARDIOVASCULAR STRESS TEST  02/09/2010  ? No scintigraphic evidence of inducible ischemia.  ? COLONOSCOPY  03/2013  ? HIP CLOSED REDUCTION Right 02/05/2016  ? Procedure: CLOSED MANIPULATION HIP;  Surgeon: Latanya Maudlin, MD;  Location: [facility];   Service: Orthopedics;  Laterality: Right;  ? knee arthroscopic Bilateral   ? LARYNGOSCOPY  08/2009  ? Dr.[name]  ? NASAL FRACTURE SURGERY    ? age 29  ? right knee arthroscopic knee surgery  12 yrs ago  ? dr Theda Sers  ? THROAT SURGERY  aug, sept, Nov 16 2016  ? vocal cord  laser sugery X 3, Dr [name] , [facility]  ? TOTAL HIP ARTHROPLASTY Right 02/01/2016  ? Procedure: RIGHT TOTAL HIP ARTHROPLASTY ANTERIOR APPROACH;  Surgeon: Paralee Cancel, MD;  [facility];  Service: Orthopedics;  Laterality: Right;  ? TOTAL HIP ARTHROPLASTY Left 12/19/2016  ? Procedure: LEFT TOTAL HIP ARTHROPLASTY ANTERIOR APPROACH;  Surgeon: Paralee Cancel, MD;  Location: [facility];  Service: Orthopedics;  Laterality: Left;  ? TOTAL HIP REVISION Right 11/11/2018  ? Procedure: right total hip arthroplasty revision, femoral stem;  Surgeon: Paralee Cancel, MD;  [facility];  Service: Orthopedics;  Laterality: Right;  87mn  ? TOTAL KNEE ARTHROPLASTY Left 11/27/2014  ? Procedure: LEFT TOTAL KNEE ARTHROPLASTY;  Surgeon: SAugustin Schooling MD;  [facility] [facility]  Service: Orthopedics;  Laterality: Left;  ? TOTAL KNEE ARTHROPLASTY Right 12/16/2019  ? Procedure:  TOTAL KNEE ARTHROPLASTY;  Surgeon: Paralee Cancel, MD;  [facility];  Service: Orthopedics;  Laterality: Right;  70 mins  ? TRANSTHORACIC ECHOCARDIOGRAM  11/08/2005  ? EF 68%, normal LV systolic function  ? UPPER GASTROINTESTINAL ENDOSCOPY  2010  ? Negative, Dr.[name]  ? ?Patient Active Problem List  ? Diagnosis Date Noted  ? Pneumonia due to COVID-19 virus 10/08/2020  ? Upper airway cough syndrome 10/08/2020  ? S/P right TKA 12/16/2019  ? Status post total knee replacement, right 12/16/2019  ? Hip fracture ([place]) 11/13/2018  ? Chronic anemia 11/06/2018  ? Reactive airway disease 11/06/2018  ? Femoral fracture ([place]) 11/05/2018  ? Morbid obesity ([place]) 12/20/2016  ? S/P left THA, AA 12/19/2016  ? Chest pain 08/15/2016  ? S/P right TH revision 02/09/2016  ? Hip instability 02/05/2016  ? H/O total hip  arthroplasty 02/05/2016  ? S/P right THA, AA 02/01/2016  ? S/P knee replacement 11/27/2014  ? Unspecified viral hepatitis C without hepatic coma 08/28/2014  ? DYSPHONIA, CHRONIC 01/06/2010  ? NONSPECIFIC ABNORMAL ELECTROCARDIOGRAM 01/06/2010  ? HOARSENESS, CHRONIC 05/31/2009  ? Hypothyroidism 04/09/2009  ? Hyperlipidemia LDL goal <100 04/09/2009  ? HTN (hypertension) 04/09/2009  ? GERD (gastroesophageal reflux disease) 04/09/2009  ? Sleep apnea 04/09/2009  ? ?  ?REFERRING PROVIDER: [place], Blondell Reveal, MD ?  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  ?THERAPY DIAG:  ?Muscle weakness (generalized) ?  ?Other abnormalities of gait and mobility ?  ?Radiculopathy, lumbar region ?  ?Difficulty in walking, not elsewhere classified ?  ?ONSET DATE: 12/30/21 ?  ?SUBJECTIVE:                                                                                                                                                                                          ?  ?SUBJECTIVE STATEMENT: ?Pt reports he had a great session last visit, "I did really good".  He reports later that evening his low back and RLE "flared up".  "I couldn't put any pressure on my RLE".  He reports he took Aleve, celebrex and Kombucha for the last 2 days for pain relief.  He reports he "feels muscle working" in his RLE for first time this week.   ? ?PERTINENT HISTORY:  ?hypertension, OSA, , hyperlipidemia, chest pain, morbid obesity.,throat cancer, chronic pain syndrome, degenerative changes in cervical and lumbar spine, PTSD, TBI, tinnitus, bilateral knee replacements, bilateral hip replacements, right hip replacement hardware failure and revision. ?  ?  ?PAIN:  ?Are you having pain? Yes ?NPRS scale: 1/10 (back),  5/10 (post neck), 7/10 knees  ?Pain location: see above ?Pain orientation: see above ?PAIN TYPE: aching, tight,  occasional jabbing ?Pain description: constant  ?Aggravating factors: walking ?Relieving factors: sitting, resting ?  ?PRECAUTIONS: Back and Fall ?  ?WEIGHT BEARING RESTRICTIONS No ?  ?FALLS:  ?Has patient fallen in last 6 months? Yes, Number of falls: 2 ?  ?LIVING ENVIRONMENT: ?Lives with: lives with their family ?Lives in: House/apartment ?Stairs: Yes; Internal: 2 steps; none ?Has following equipment at home: Single point cane ?  ?OCCUPATION: manager/sits a desk ?  ?PLOF: Independent with basic ADLs ?  ?PATIENT GOALS walk straighter ?  ?  ?OBJECTIVE:  ? * All objective findings take at Evaluation unless otherwise noted.  ? ?DIAGNOSTIC FINDINGS:  ?Neg blood clots LE ?  ?PATIENT SURVEYS:  ?FOTO 77 with goal of 59 ?  ?SCREENING FOR RED FLAGS: ?          neg ?  ?COGNITION: ?         Overall cognitive status: Within functional limits for tasks assessed              ?          ?SENSATION: ?numbness, burning and tingling in bilateral feet. ?         Light touch: Deficits feet ?          ?  ?MUSCLE LENGTH: ?Hamstrings: Right 80 deg; Left 60 deg ?  ?  ?POSTURE:  ?Right hip elevation, flattened lumbar spine ?  ?PALPATION: ?Lt post innominate rotation ?  ?LUMBARAROM/PROM ?  ?A/PROM A/PROM  ?02/14/2022  ?Flexion 18 inches fingertips to floor  ?Extension -10  ?Right lateral flexion    ?Left lateral flexion    ?Right rotation    ?Left rotation    ? (Blank rows = not tested) ?  ?LE AROM/PROM: ?  ?A/PROM Right ?02/14/2022 Left ?02/14/2022  ?Hip flexion 90 110  ?Hip extension      ?Hip abduction      ?Hip adduction      ?Hip internal rotation      ?Hip external rotation      ?Knee flexion wfl wfl  ?Knee extension      ?Ankle dorsiflexion 5 30  ?Ankle plantarflexion      ?Ankle inversion      ?Ankle eversion      ? (Blank rows = not tested) ?  ?LE MMT: ?  ?MMT Right ?02/14/2022 Left ?02/14/2022  ?Hip flexion 22.7 47.78  ?Hip extension 27 31  ?Hip abduction 26.0 49.2  ?Hip adduction      ?Hip internal rotation      ?Hip external rotation       ?Knee flexion 35.6 43.5  ?Knee extension      ?Ankle dorsiflexion      ?Ankle plantarflexion      ?Ankle inversion      ?Ankle eversion      ? (Blank rows = not tested) ?  ?LUMBAR SPECIAL TESTS:  ?Straight leg rais

## 2022-04-11 ENCOUNTER — Encounter (HOSPITAL_BASED_OUTPATIENT_CLINIC_OR_DEPARTMENT_OTHER): Payer: Self-pay | Admitting: Physical Therapy

## 2022-04-11 ENCOUNTER — Ambulatory Visit (HOSPITAL_BASED_OUTPATIENT_CLINIC_OR_DEPARTMENT_OTHER): Payer: No Typology Code available for payment source | Attending: Family Medicine | Admitting: Physical Therapy

## 2022-04-11 ENCOUNTER — Other Ambulatory Visit (HOSPITAL_BASED_OUTPATIENT_CLINIC_OR_DEPARTMENT_OTHER): Payer: Self-pay

## 2022-04-11 DIAGNOSIS — M6281 Muscle weakness (generalized): Secondary | ICD-10-CM | POA: Insufficient documentation

## 2022-04-11 DIAGNOSIS — M5416 Radiculopathy, lumbar region: Secondary | ICD-10-CM | POA: Diagnosis present

## 2022-04-11 DIAGNOSIS — R2689 Other abnormalities of gait and mobility: Secondary | ICD-10-CM | POA: Insufficient documentation

## 2022-04-11 DIAGNOSIS — R262 Difficulty in walking, not elsewhere classified: Secondary | ICD-10-CM | POA: Diagnosis present

## 2022-04-11 DIAGNOSIS — M25551 Pain in right hip: Secondary | ICD-10-CM | POA: Diagnosis present

## 2022-04-11 NOTE — Therapy (Signed)
?OUTPATIENT PHYSICAL THERAPY TREATMENT NOTE ?Recertification ? ? ?Patient Name: Samuel Goodwin ?MRN: [id number] ?DOB:October 18, 1961, 61 y.o., male ?Today's Date: 04/12/2022 ? ?PCP: de [place], Raymond J, MD ?REFERRING PROVIDER: [place], Raymond J, MD ? ? PT End of Session - 04/11/22 1707   ? ? Visit Number 11   ? Number of Visits 15   ? Date for PT Re-Evaluation 05/09/22   ? PT Start Time 1615   ? PT Stop Time 6834   ? PT Time Calculation (min) 50 min   ? ?  ?  ? ?  ? ? ? ? ?Past Medical History:  ?Diagnosis Date  ? Alcohol abuse   ? Sobriety since 1990  ? Anxiety   ? Arthritis   ? oa  ? Benign positional vertigo   ? Depression   ? Eardrum trauma   ? 30 years ago/right ear  ? GERD (gastroesophageal reflux disease)   ? Headache   ? sinus  ? Hepatitis C 1994  ? hep c - ? etiology; in [place] 1989-91; S/P interferon , Dr [name]  ? History of bronchitis   ? History of iron deficiency anemia   ? Hoarseness   ? Hyperlipidemia   ? Hypertension   ? Hypothyroidism   ? Obesity   ? OSA (obstructive sleep apnea)   ? on CPAP  ? PONV (postoperative nausea and vomiting)   ? following knee surgery last year   ? PTSD (post-traumatic stress disorder)   ? PTSD (post-traumatic stress disorder)   ? 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 Bilateral  ? Vocal cord polyps   ? ?Past Surgical History:  ?Procedure Laterality Date  ? ANTERIOR HIP REVISION Right 02/07/2016  ? Procedure: ANTERIOR HIP REVISION;  Surgeon: Paralee Cancel, MD;  Location: [facility];  Service: Orthopedics;  Laterality: Right;  ? CARDIOVASCULAR STRESS TEST  02/09/2010  ? No scintigraphic evidence of inducible ischemia.  ? COLONOSCOPY  03/2013  ? HIP CLOSED REDUCTION Right 02/05/2016  ? Procedure: CLOSED MANIPULATION HIP;  Surgeon: Latanya Maudlin, MD;  Location: [facility];  Service: Orthopedics;  Laterality: Right;  ? knee arthroscopic Bilateral   ? LARYNGOSCOPY  08/2009  ? Dr.[name]  ? NASAL FRACTURE SURGERY    ? age 13  ? right knee arthroscopic knee  surgery  12 yrs ago  ? dr Theda Sers  ? THROAT SURGERY  aug, sept, Nov 16 2016  ? vocal cord  laser sugery X 3, Dr [name] , [facility]  ? TOTAL HIP ARTHROPLASTY Right 02/01/2016  ? Procedure: RIGHT TOTAL HIP ARTHROPLASTY ANTERIOR APPROACH;  Surgeon: Paralee Cancel, MD;  Location: [facility];  Service: Orthopedics;  Laterality: Right;  ? TOTAL HIP ARTHROPLASTY Left 12/19/2016  ? Procedure: LEFT TOTAL HIP ARTHROPLASTY ANTERIOR APPROACH;  Surgeon: Paralee Cancel, MD;  Location: [facility];  Service: Orthopedics;  Laterality: Left;  ? TOTAL HIP REVISION Right 11/11/2018  ? Procedure: right total hip arthroplasty revision, femoral stem;  Surgeon: Paralee Cancel, MD;  Location: [facility];  Service: Orthopedics;  Laterality: Right;  72mn  ? TOTAL KNEE ARTHROPLASTY Left 11/27/2014  ? Procedure: LEFT TOTAL KNEE ARTHROPLASTY;  Surgeon: SAugustin Schooling MD;  [facility]  Service: Orthopedics;  Laterality: Left;  ? TOTAL KNEE ARTHROPLASTY Right 12/16/2019  ? Procedure: TOTAL KNEE ARTHROPLASTY;  Surgeon: OParalee Cancel MD;  Location: [facility];  Service: Orthopedics;  Laterality: Right;  70 mins  ? TRANSTHORACIC ECHOCARDIOGRAM  11/08/2005  ? EF 68%, normal  LV systolic function  ? UPPER GASTROINTESTINAL ENDOSCOPY  2010  ? Negative, Dr.[name]  ? ?Patient Active Problem List  ? Diagnosis Date Noted  ? Pneumonia due to COVID-19 virus 10/08/2020  ? Upper airway cough syndrome 10/08/2020  ? S/P right TKA 12/16/2019  ? Status post total knee replacement, right 12/16/2019  ? Hip fracture ([place]) 11/13/2018  ? Chronic anemia 11/06/2018  ? Reactive airway disease 11/06/2018  ? Femoral fracture ([place]) 11/05/2018  ? Morbid obesity ([place]) 12/20/2016  ? S/P left THA, AA 12/19/2016  ? Chest pain 08/15/2016  ? S/P right TH revision 02/09/2016  ? Hip instability 02/05/2016  ? H/O total hip arthroplasty 02/05/2016  ? S/P right THA, AA 02/01/2016  ? S/P knee replacement 11/27/2014  ? Unspecified viral hepatitis C without hepatic coma 08/28/2014  ? DYSPHONIA, CHRONIC 01/06/2010   ? NONSPECIFIC ABNORMAL ELECTROCARDIOGRAM 01/06/2010  ? HOARSENESS, CHRONIC 05/31/2009  ? Hypothyroidism 04/09/2009  ? Hyperlipidemia LDL goal <100 04/09/2009  ? HTN (hypertension) 04/09/2009  ? GERD (gastroesophageal reflux disease) 04/09/2009  ? Sleep apnea 04/09/2009  ? ?  ?REFERRING PROVIDER: [place], Blondell Reveal, MD ?  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  ?THERAPY DIAG:  ?Muscle weakness (generalized) ?  ?Other abnormalities of gait and mobility ?  ?Radiculopathy, lumbar region ?  ?Difficulty in walking, not elsewhere classified ?  ?ONSET DATE: 12/30/21 ?  ?SUBJECTIVE:                                                                                                                                                                                          ?  ?SUBJECTIVE STATEMENT: ?Continued reports of daily pain and dysfunction in right hip.  No falls ? ?PERTINENT HISTORY:  ?hypertension, OSA, , hyperlipidemia, chest pain, morbid obesity.,throat cancer, chronic pain syndrome, degenerative changes in cervical and lumbar spine, PTSD, TBI, tinnitus, bilateral knee replacements, bilateral hip replacements, right hip replacement hardware failure and revision. ?  ?  ?PAIN:  ?Are you having pain? Yes ?NPRS scale: 1/10 (back),  5/10 (post neck), 7/10 knees  ?Pain location: see above ?Pain orientation: see above ?PAIN TYPE: aching, tight, occasional jabbing ?Pain description: constant  ?Aggravating factors: walking ?Relieving factors: sitting, resting ?  ?PRECAUTIONS: Back and Fall ?  ?WEIGHT BEARING RESTRICTIONS No ?  ?FALLS:  ?Has patient fallen in last 6 months? Yes, Number of falls: 2 ?  ?LIVING ENVIRONMENT: ?Lives with: lives with their family ?Lives in: House/apartment ?Stairs: Yes; Internal: 2 steps; none ?Has following equipment at home: Single point cane ?  ?  OCCUPATION:  manager/sits a desk ?  ?PLOF: Independent with basic ADLs ?  ?PATIENT GOALS walk straighter ?  ?  ?OBJECTIVE:  ? * All objective findings take at Evaluation unless otherwise noted.  ? ?DIAGNOSTIC FINDINGS:  ?Neg blood clots LE ?  ?PATIENT SURVEYS:  ?FOTO 32 with goal of 43 ?04/11/22: 38 ?  ?SCREENING FOR RED FLAGS: ?          neg ?  ?COGNITION: ?         Overall cognitive status: Within functional limits for tasks assessed              ?          ?SENSATION: ?numbness, burning and tingling in bilateral feet. ?         Light touch: Deficits feet ?          ?  ?MUSCLE LENGTH: ?Hamstrings: Right 80 deg; Left 60 deg ?  ?  ?POSTURE:  ?Right hip elevation, flattened lumbar spine ?  ?PALPATION: ?Lt post innominate rotation ?  ?LUMBARAROM/PROM ?  ?A/PROM A/PROM  ?02/14/2022  ?Flexion 18 inches fingertips to floor  ?Extension -10  ?Right lateral flexion    ?Left lateral flexion    ?Right rotation    ?Left rotation    ? (Blank rows = not tested) ?  ?LE AROM/PROM: ?  ?A/PROM Right ?02/14/2022 Left ?02/14/2022 Right ?04/11/22 Left ?04/11/22  ?Hip flexion 90 110 95 110  ?Hip extension        ?Hip abduction        ?Hip adduction        ?Hip internal rotation        ?Hip external rotation        ?Knee flexion wfl wfl    ?Knee extension        ?Ankle dorsiflexion '5 30 6   '$ ?Ankle plantarflexion        ?Ankle inversion        ?Ankle eversion        ? (Blank rows = not tested) ?  ?LE MMT: ?  ?MMT Right ?02/14/2022 Left ?02/14/2022 Right Left ?04/11/22  ?Hip flexion 22.7 47.78 22.7 48.5  ?Hip extension 27 31 32.5 34.8  ?Hip abduction 26.0 49.2 25.6 52.3  ?Hip adduction        ?Hip internal rotation        ?Hip external rotation        ?Knee flexion 35.6 43.5 24.1 38.6  ?Knee extension        ?Ankle dorsiflexion        ?Ankle plantarflexion        ?Ankle inversion        ?Ankle eversion        ? (Blank rows = not tested) ?  ?LUMBAR SPECIAL TESTS:  ?Straight leg raise test: Negative, Slump test: Negative, and Long sit test: Negative ?  ?FUNCTIONAL TESTS:   ?6 minute walk test 655  ?5x STS test  17s  04/11/22 20s ?  ?GAIT: ?Distance walked: 655 ?Assistive device utilized: None ?Level of assistance: Complete Independence ?Comments: antalgic, significant lateral displa

## 2022-04-13 ENCOUNTER — Ambulatory Visit (HOSPITAL_BASED_OUTPATIENT_CLINIC_OR_DEPARTMENT_OTHER): Payer: No Typology Code available for payment source | Admitting: Physical Therapy

## 2022-04-18 ENCOUNTER — Ambulatory Visit (HOSPITAL_BASED_OUTPATIENT_CLINIC_OR_DEPARTMENT_OTHER): Payer: No Typology Code available for payment source | Admitting: Physical Therapy

## 2022-04-18 ENCOUNTER — Encounter (HOSPITAL_BASED_OUTPATIENT_CLINIC_OR_DEPARTMENT_OTHER): Payer: Self-pay | Admitting: Physical Therapy

## 2022-04-18 DIAGNOSIS — R2689 Other abnormalities of gait and mobility: Secondary | ICD-10-CM

## 2022-04-18 DIAGNOSIS — M5416 Radiculopathy, lumbar region: Secondary | ICD-10-CM

## 2022-04-18 DIAGNOSIS — M6281 Muscle weakness (generalized): Secondary | ICD-10-CM | POA: Diagnosis not present

## 2022-04-18 DIAGNOSIS — R262 Difficulty in walking, not elsewhere classified: Secondary | ICD-10-CM

## 2022-04-18 NOTE — Therapy (Signed)
?OUTPATIENT PHYSICAL THERAPY TREATMENT NOTE ?Recertification ? ? ?Patient Name: Samuel Goodwin ?MRN: [id number] ?DOB:06-10-1961, 61 y.o., male ?Today's Date: 04/18/2022 ? ?PCP: [place], Raymond J, MD ?REFERRING PROVIDER: [place], Raymond J, MD ? ? PT End of Session - 04/18/22 1624   ? ? Visit Number 12   ? Number of Visits 15   ? Date for PT Re-Evaluation 05/09/22   ? Authorization Type [facility]   ? PT Start Time [id number]   ? PT Stop Time 1700   ? PT Time Calculation (min) 42 min   ? Equipment Utilized During Treatment Other (comment)   ? Activity Tolerance Patient tolerated treatment well   ? Behavior During Therapy [facility] for tasks assessed/performed   ? ?  ?  ? ?  ? ? ? ? ? ?Past Medical History:  ?Diagnosis Date  ? Alcohol abuse   ? Sobriety since 1990  ? Anxiety   ? Arthritis   ? oa  ? Benign positional vertigo   ? Depression   ? Eardrum trauma   ? 30 years ago/right ear  ? GERD (gastroesophageal reflux disease)   ? Headache   ? sinus  ? Hepatitis C 1994  ? hep c - ? etiology; in [place] 1989-91; S/P interferon , Dr [name]  ? History of bronchitis   ? History of iron deficiency anemia   ? Hoarseness   ? Hyperlipidemia   ? Hypertension   ? Hypothyroidism   ? Obesity   ? OSA (obstructive sleep apnea)   ? on CPAP  ? PONV (postoperative nausea and vomiting)   ? following knee surgery last year   ? PTSD (post-traumatic stress disorder)   ? PTSD (post-traumatic stress disorder)   ? 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 Bilateral  ? Vocal cord polyps   ? ?Past Surgical History:  ?Procedure Laterality Date  ? ANTERIOR HIP REVISION Right 02/07/2016  ? Procedure: ANTERIOR HIP REVISION;  Surgeon: Paralee Cancel, MD;  [facility]: [facility];  Service: Orthopedics;  Laterality: Right;  ? CARDIOVASCULAR STRESS TEST  02/09/2010  ? No scintigraphic evidence of inducible ischemia.  ? COLONOSCOPY  03/2013  ? HIP CLOSED REDUCTION Right 02/05/2016  ? Procedure: CLOSED MANIPULATION HIP;  Surgeon: Latanya Maudlin, MD;   Location: [facility];  Service: Orthopedics;  Laterality: Right;  ? knee arthroscopic Bilateral   ? LARYNGOSCOPY  08/2009  ? Dr.[name]  ? NASAL FRACTURE SURGERY    ? age 50  ? right knee arthroscopic knee surgery  12 yrs ago  ? dr Theda Sers  ? THROAT SURGERY  aug, sept, Nov 16 2016  ? vocal cord  laser sugery X 3, Dr [name] , [facility]  ? TOTAL HIP ARTHROPLASTY Right 02/01/2016  ? Procedure: RIGHT TOTAL HIP ARTHROPLASTY ANTERIOR APPROACH;  Surgeon: Paralee Cancel, MD;  [facility]: [facility];  Service: Orthopedics;  Laterality: Right;  ? TOTAL HIP ARTHROPLASTY Left 12/19/2016  ? Procedure: LEFT TOTAL HIP ARTHROPLASTY ANTERIOR APPROACH;  Surgeon: Paralee Cancel, MD;  [facility];  Service: Orthopedics;  Laterality: Left;  ? TOTAL HIP REVISION Right 11/11/2018  ? Procedure: right total hip arthroplasty revision, femoral stem;  Surgeon: Paralee Cancel, MD;  [facility];  Service: Orthopedics;  Laterality: Right;  26mn  ? TOTAL KNEE ARTHROPLASTY Left 11/27/2014  ? Procedure: LEFT TOTAL KNEE ARTHROPLASTY;  Surgeon: SAugustin Schooling MD;  [facility]  Service: Orthopedics;  Laterality: Left;  ? TOTAL KNEE ARTHROPLASTY Right 12/16/2019  ?  Procedure: TOTAL KNEE ARTHROPLASTY;  Surgeon: Paralee Cancel, MD;  [facility];  Service: Orthopedics;  Laterality: Right;  70 mins  ? TRANSTHORACIC ECHOCARDIOGRAM  11/08/2005  ? EF 68%, normal LV systolic function  ? UPPER GASTROINTESTINAL ENDOSCOPY  2010  ? Negative, Dr.[name]  ? ?Patient Active Problem List  ? Diagnosis Date Noted  ? Pneumonia due to COVID-19 virus 10/08/2020  ? Upper airway cough syndrome 10/08/2020  ? S/P right TKA 12/16/2019  ? Status post total knee replacement, right 12/16/2019  ? Hip fracture ([place]) 11/13/2018  ? Chronic anemia 11/06/2018  ? Reactive airway disease 11/06/2018  ? Femoral fracture ([place]) 11/05/2018  ? Morbid obesity ([place]) 12/20/2016  ? S/P left THA, AA 12/19/2016  ? Chest pain 08/15/2016  ? S/P right TH revision 02/09/2016  ? Hip instability 02/05/2016  ? H/O  total hip arthroplasty 02/05/2016  ? S/P right THA, AA 02/01/2016  ? S/P knee replacement 11/27/2014  ? Unspecified viral hepatitis C without hepatic coma 08/28/2014  ? DYSPHONIA, CHRONIC 01/06/2010  ? NONSPECIFIC ABNORMAL ELECTROCARDIOGRAM 01/06/2010  ? HOARSENESS, CHRONIC 05/31/2009  ? Hypothyroidism 04/09/2009  ? Hyperlipidemia LDL goal <100 04/09/2009  ? HTN (hypertension) 04/09/2009  ? GERD (gastroesophageal reflux disease) 04/09/2009  ? Sleep apnea 04/09/2009  ? ?  ?REFERRING PROVIDER: [place], Blondell Reveal, MD ?  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  ?THERAPY DIAG:  ?Muscle weakness (generalized) ?  ?Other abnormalities of gait and mobility ?  ?Radiculopathy, lumbar region ?  ?Difficulty in walking, not elsewhere classified ?  ?ONSET DATE: 12/30/21 ?  ?SUBJECTIVE:                                                                                                                                                                                          ?  ?SUBJECTIVE STATEMENT: ?Continued reports of daily pain and dysfunction in right hip.  No falls ? ?PERTINENT HISTORY:  ?hypertension, OSA, , hyperlipidemia, chest pain, morbid obesity.,throat cancer, chronic pain syndrome, degenerative changes in cervical and lumbar spine, PTSD, TBI, tinnitus, bilateral knee replacements, bilateral hip replacements, right hip replacement hardware failure and revision. ?  ?  ?PAIN:  ?Are you having pain? Yes ?NPRS scale: 1/10 (back),  5/10 (post neck), 7/10 knees  ?Pain location: see above ?Pain orientation: see above ?PAIN TYPE: aching, tight, occasional jabbing ?Pain description: constant  ?Aggravating factors: walking ?Relieving factors: sitting, resting ?  ?PRECAUTIONS: Back and Fall ?  ?WEIGHT BEARING RESTRICTIONS No ?  ?FALLS:  ?Has patient fallen in last 6 months?  Yes, Number of falls: 2 ?   ?LIVING ENVIRONMENT: ?Lives with: lives with their family ?Lives in: House/apartment ?Stairs: Yes; Internal: 2 steps; none ?Has following equipment at home: Single point cane ?  ?OCCUPATION: manager/sits a desk ?  ?PLOF: Independent with basic ADLs ?  ?PATIENT GOALS walk straighter ?  ?  ?OBJECTIVE:  ? * All objective findings take at Evaluation unless otherwise noted.  ? ?DIAGNOSTIC FINDINGS:  ?Neg blood clots LE ?  ?PATIENT SURVEYS:  ?FOTO 32 with goal of 43 ?04/11/22: 38 ?  ?SCREENING FOR RED FLAGS: ?          neg ?  ?COGNITION: ?         Overall cognitive status: Within functional limits for tasks assessed              ?          ?SENSATION: ?numbness, burning and tingling in bilateral feet. ?         Light touch: Deficits feet ?          ?  ?MUSCLE LENGTH: ?Hamstrings: Right 80 deg; Left 60 deg ?  ?  ?POSTURE:  ?Right hip elevation, flattened lumbar spine ?  ?PALPATION: ?Lt post innominate rotation ?  ?LUMBARAROM/PROM ?  ?A/PROM A/PROM  ?02/14/2022  ?Flexion 18 inches fingertips to floor  ?Extension -10  ?Right lateral flexion    ?Left lateral flexion    ?Right rotation    ?Left rotation    ? (Blank rows = not tested) ?  ?LE AROM/PROM: ?  ?A/PROM Right ?02/14/2022 Left ?02/14/2022 Right ?04/11/22 Left ?04/11/22  ?Hip flexion 90 110 95 110  ?Hip extension        ?Hip abduction        ?Hip adduction        ?Hip internal rotation        ?Hip external rotation        ?Knee flexion wfl wfl    ?Knee extension        ?Ankle dorsiflexion '5 30 6   '$ ?Ankle plantarflexion        ?Ankle inversion        ?Ankle eversion        ? (Blank rows = not tested) ?  ?LE MMT: ?  ?MMT Right ?02/14/2022 Left ?02/14/2022 Right Left ?04/11/22  ?Hip flexion 22.7 47.78 22.7 48.5  ?Hip extension 27 31 32.5 34.8  ?Hip abduction 26.0 49.2 25.6 52.3  ?Hip adduction        ?Hip internal rotation        ?Hip external rotation        ?Knee flexion 35.6 43.5 24.1 38.6  ?Knee extension        ?Ankle dorsiflexion        ?Ankle plantarflexion        ?Ankle inversion         ?Ankle eversion        ? (Blank rows = not tested) ?  ?LUMBAR SPECIAL TESTS:  ?Straight leg raise test: Negative, Slump test: Negative, and Long sit test: Negative ?  ?FUNCTIONAL TESTS:  ?6 minute

## 2022-04-20 ENCOUNTER — Ambulatory Visit (HOSPITAL_BASED_OUTPATIENT_CLINIC_OR_DEPARTMENT_OTHER): Payer: Self-pay | Admitting: Physical Therapy

## 2022-04-21 ENCOUNTER — Other Ambulatory Visit (HOSPITAL_BASED_OUTPATIENT_CLINIC_OR_DEPARTMENT_OTHER): Payer: Self-pay

## 2022-04-25 ENCOUNTER — Other Ambulatory Visit (HOSPITAL_BASED_OUTPATIENT_CLINIC_OR_DEPARTMENT_OTHER): Payer: Self-pay

## 2022-04-25 ENCOUNTER — Ambulatory Visit (HOSPITAL_BASED_OUTPATIENT_CLINIC_OR_DEPARTMENT_OTHER): Payer: No Typology Code available for payment source | Admitting: Physical Therapy

## 2022-04-25 ENCOUNTER — Encounter (HOSPITAL_BASED_OUTPATIENT_CLINIC_OR_DEPARTMENT_OTHER): Payer: Self-pay | Admitting: Physical Therapy

## 2022-04-25 DIAGNOSIS — R2689 Other abnormalities of gait and mobility: Secondary | ICD-10-CM

## 2022-04-25 DIAGNOSIS — M5416 Radiculopathy, lumbar region: Secondary | ICD-10-CM

## 2022-04-25 DIAGNOSIS — M6281 Muscle weakness (generalized): Secondary | ICD-10-CM

## 2022-04-25 NOTE — Therapy (Signed)
?OUTPATIENT PHYSICAL THERAPY TREATMENT NOTE ?Recertification ? ? ?Patient Name: Samuel Goodwin ?MRN: [id number] ?DOB:Dec 05, 1961, 61 y.o., male ?Today's Date: 04/25/2022 ? ?PCP: de [place], Raymond J, MD ?REFERRING PROVIDER: [place], Raymond J, MD ? ? PT End of Session - 04/25/22 1625   ? ? Visit Number 13   ? Number of Visits 15   ? Date for PT Re-Evaluation 05/09/22   ? Authorization Type [facility]   ? PT Start Time 1616   ? PT Stop Time 1700   ? PT Time Calculation (min) 44 min   ? Equipment Utilized During Treatment Other (comment)   ? Activity Tolerance Patient tolerated treatment well   ? Behavior During Therapy [facility] for tasks assessed/performed   ? ?  ?  ? ?  ? ? ? ? ? ?Past Medical History:  ?Diagnosis Date  ? Alcohol abuse   ? Sobriety since 1990  ? Anxiety   ? Arthritis   ? oa  ? Benign positional vertigo   ? Depression   ? Eardrum trauma   ? 30 years ago/right ear  ? GERD (gastroesophageal reflux disease)   ? Headache   ? sinus  ? Hepatitis C 1994  ? hep c - ? etiology; in [place] 1989-91; S/P interferon , Dr [name]  ? History of bronchitis   ? History of iron deficiency anemia   ? Hoarseness   ? Hyperlipidemia   ? Hypertension   ? Hypothyroidism   ? Obesity   ? OSA (obstructive sleep apnea)   ? on CPAP  ? PONV (postoperative nausea and vomiting)   ? following knee surgery last year   ? PTSD (post-traumatic stress disorder)   ? PTSD (post-traumatic stress disorder)   ? 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 Bilateral  ? Vocal cord polyps   ? ?Past Surgical History:  ?Procedure Laterality Date  ? ANTERIOR HIP REVISION Right 02/07/2016  ? Procedure: ANTERIOR HIP REVISION;  Surgeon: Paralee Cancel, MD;  Location: [facility];  Service: Orthopedics;  Laterality: Right;  ? CARDIOVASCULAR STRESS TEST  02/09/2010  ? No scintigraphic evidence of inducible ischemia.  ? COLONOSCOPY  03/2013  ? HIP CLOSED REDUCTION Right 02/05/2016  ? Procedure: CLOSED MANIPULATION HIP;  Surgeon: Latanya Maudlin, MD;   Location: [facility];  Service: Orthopedics;  Laterality: Right;  ? knee arthroscopic Bilateral   ? LARYNGOSCOPY  08/2009  ? Dr.[name]  ? NASAL FRACTURE SURGERY    ? age 70  ? right knee arthroscopic knee surgery  12 yrs ago  ? dr Theda Sers  ? THROAT SURGERY  aug, sept, Nov 16 2016  ? vocal cord  laser sugery X 3, Dr [name] , [facility]  ? TOTAL HIP ARTHROPLASTY Right 02/01/2016  ? Procedure: RIGHT TOTAL HIP ARTHROPLASTY ANTERIOR APPROACH;  Surgeon: Paralee Cancel, MD;  [facility];  Service: Orthopedics;  Laterality: Right;  ? TOTAL HIP ARTHROPLASTY Left 12/19/2016  ? Procedure: LEFT TOTAL HIP ARTHROPLASTY ANTERIOR APPROACH;  Surgeon: Paralee Cancel, MD;  [facility];  Service: Orthopedics;  Laterality: Left;  ? TOTAL HIP REVISION Right 11/11/2018  ? Procedure: right total hip arthroplasty revision, femoral stem;  Surgeon: Paralee Cancel, MD;  [facility];  Service: Orthopedics;  Laterality: Right;  60mn  ? TOTAL KNEE ARTHROPLASTY Left 11/27/2014  ? Procedure: LEFT TOTAL KNEE ARTHROPLASTY;  Surgeon: SAugustin Schooling MD;  [place]  Service: Orthopedics;  Laterality: Left;  ? TOTAL KNEE ARTHROPLASTY Right 12/16/2019  ?  Procedure: TOTAL KNEE ARTHROPLASTY;  Surgeon: Paralee Cancel, MD;  [facility];  Service: Orthopedics;  Laterality: Right;  70 mins  ? TRANSTHORACIC ECHOCARDIOGRAM  11/08/2005  ? EF 68%, normal LV systolic function  ? UPPER GASTROINTESTINAL ENDOSCOPY  2010  ? Negative, Dr.[name]  ? ?Patient Active Problem List  ? Diagnosis Date Noted  ? Pneumonia due to COVID-19 virus 10/08/2020  ? Upper airway cough syndrome 10/08/2020  ? S/P right TKA 12/16/2019  ? Status post total knee replacement, right 12/16/2019  ? Hip fracture ([place]) 11/13/2018  ? Chronic anemia 11/06/2018  ? Reactive airway disease 11/06/2018  ? Femoral fracture ([place]) 11/05/2018  ? Morbid obesity ([place]) 12/20/2016  ? S/P left THA, AA 12/19/2016  ? Chest pain 08/15/2016  ? S/P right TH revision 02/09/2016  ? Hip instability 02/05/2016  ? H/O  total hip arthroplasty 02/05/2016  ? S/P right THA, AA 02/01/2016  ? S/P knee replacement 11/27/2014  ? Unspecified viral hepatitis C without hepatic coma 08/28/2014  ? DYSPHONIA, CHRONIC 01/06/2010  ? NONSPECIFIC ABNORMAL ELECTROCARDIOGRAM 01/06/2010  ? HOARSENESS, CHRONIC 05/31/2009  ? Hypothyroidism 04/09/2009  ? Hyperlipidemia LDL goal <100 04/09/2009  ? HTN (hypertension) 04/09/2009  ? GERD (gastroesophageal reflux disease) 04/09/2009  ? Sleep apnea 04/09/2009  ? ?  ?REFERRING PROVIDER: [place], Blondell Reveal, MD ?  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  ?THERAPY DIAG:  ?Muscle weakness (generalized) ?  ?Other abnormalities of gait and mobility ?  ?Radiculopathy, lumbar region ?  ?Difficulty in walking, not elsewhere classified ?  ?ONSET DATE: 12/30/21 ?  ?SUBJECTIVE:                                                                                                                                                                                          ?  ?SUBJECTIVE STATEMENT: ?Pt requesting specific HEP for preparing for DC. ? ?PERTINENT HISTORY:  ?hypertension, OSA, , hyperlipidemia, chest pain, morbid obesity.,throat cancer, chronic pain syndrome, degenerative changes in cervical and lumbar spine, PTSD, TBI, tinnitus, bilateral knee replacements, bilateral hip replacements, right hip replacement hardware failure and revision. ?  ?  ?PAIN:  ?Are you having pain? Yes ?NPRS scale: 1/10 (back),  5/10 (post neck), 7/10 knees  ?Pain location: see above ?Pain orientation: see above ?PAIN TYPE: aching, tight, occasional jabbing ?Pain description: constant  ?Aggravating factors: walking ?Relieving factors: sitting, resting ?  ?PRECAUTIONS: Back and Fall ?  ?WEIGHT BEARING RESTRICTIONS No ?  ?FALLS:  ?Has patient fallen in last 6 months? Yes, Number of falls: 2 ?  ?  LIVING  ENVIRONMENT: ?Lives with: lives with their family ?Lives in: House/apartment ?Stairs: Yes; Internal: 2 steps; none ?Has following equipment at home: Single point cane ?  ?OCCUPATION: manager/sits a desk ?  ?PLOF: Independent with basic ADLs ?  ?PATIENT GOALS walk straighter ?  ?  ?OBJECTIVE:  ? * All objective findings take at Evaluation unless otherwise noted.  ? ?DIAGNOSTIC FINDINGS:  ?Neg blood clots LE ?  ?PATIENT SURVEYS:  ?FOTO 32 with goal of 43 ?04/11/22: 38 ?  ?SCREENING FOR RED FLAGS: ?          neg ?  ?COGNITION: ?         Overall cognitive status: Within functional limits for tasks assessed              ?          ?SENSATION: ?numbness, burning and tingling in bilateral feet. ?         Light touch: Deficits feet ?          ?  ?MUSCLE LENGTH: ?Hamstrings: Right 80 deg; Left 60 deg ?  ?  ?POSTURE:  ?Right hip elevation, flattened lumbar spine ?  ?PALPATION: ?Lt post innominate rotation ?  ?LUMBARAROM/PROM ?  ?A/PROM A/PROM  ?02/14/2022  ?Flexion 18 inches fingertips to floor  ?Extension -10  ?Right lateral flexion    ?Left lateral flexion    ?Right rotation    ?Left rotation    ? (Blank rows = not tested) ?  ?LE AROM/PROM: ?  ?A/PROM Right ?02/14/2022 Left ?02/14/2022 Right ?04/11/22 Left ?04/11/22  ?Hip flexion 90 110 95 110  ?Hip extension        ?Hip abduction        ?Hip adduction        ?Hip internal rotation        ?Hip external rotation        ?Knee flexion wfl wfl    ?Knee extension        ?Ankle dorsiflexion '5 30 6   '$ ?Ankle plantarflexion        ?Ankle inversion        ?Ankle eversion        ? (Blank rows = not tested) ?  ?LE MMT: ?  ?MMT Right ?02/14/2022 Left ?02/14/2022 Right Left ?04/11/22  ?Hip flexion 22.7 47.78 22.7 48.5  ?Hip extension 27 31 32.5 34.8  ?Hip abduction 26.0 49.2 25.6 52.3  ?Hip adduction        ?Hip internal rotation        ?Hip external rotation        ?Knee flexion 35.6 43.5 24.1 38.6  ?Knee extension        ?Ankle dorsiflexion        ?Ankle plantarflexion        ?Ankle inversion         ?Ankle eversion        ? (Blank rows = not tested) ?  ?LUMBAR SPECIAL TESTS:  ?Straight leg raise test: Negative, Slump test: Negative, and Long sit test: Negative ?  ?FUNCTIONAL TESTS:  ?6 minute walk test 655  ?5x STS

## 2022-05-03 ENCOUNTER — Ambulatory Visit (HOSPITAL_BASED_OUTPATIENT_CLINIC_OR_DEPARTMENT_OTHER): Payer: No Typology Code available for payment source | Admitting: Physical Therapy

## 2022-05-04 ENCOUNTER — Ambulatory Visit (HOSPITAL_BASED_OUTPATIENT_CLINIC_OR_DEPARTMENT_OTHER): Payer: No Typology Code available for payment source | Admitting: Physical Therapy

## 2022-05-10 ENCOUNTER — Ambulatory Visit (HOSPITAL_BASED_OUTPATIENT_CLINIC_OR_DEPARTMENT_OTHER): Payer: Self-pay | Admitting: Physical Therapy

## 2022-06-07 NOTE — Progress Notes (Deleted)
Cardiology Clinic Note   Patient Name: Samuel Goodwin Date of Encounter: 06/07/2022  Primary Care Provider:  [place], Blondell Reveal, MD Primary Cardiologist:  Quay Burow, MD  Patient Profile    61 year old male patient with history of hypertension, alcohol abuse (sobriety since 1990) anxiety, depression, GERD, iron deficiency anemia, hyperlipidemia, hypothyroidism, history of right TKA and left THA, OSA on CPAP, obesity, history of squamous cell carcinoma in situ of a true vocal cord diagnosed in 2017 bilaterally.  Last seen in the office on 01/31/2022 by Coletta Memos NP.  Past Medical History    Past Medical History:  Diagnosis Date   Alcohol abuse    Sobriety since 1990   Anxiety    Arthritis    oa   Benign positional vertigo    Depression    Eardrum trauma    30 years ago/right ear   GERD (gastroesophageal reflux disease)    Headache    sinus   Hepatitis C 1994   hep c - ? 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   COLONOSCOPY  03/2013   HIP CLOSED REDUCTION Right 02/05/2016   Procedure: CLOSED MANIPULATION HIP;  Surgeon: Latanya Maudlin, MD;  [facility];  Service: Orthopedics;  Laterality:  Right;   knee arthroscopic Bilateral    LARYNGOSCOPY  08/2009   Dr.[name]   NASAL FRACTURE SURGERY     age 40   right knee arthroscopic knee surgery  12 yrs ago   dr Theda Sers   THROAT SURGERY  aug, sept, Nov 16 2016   vocal cord  laser sugery X 3, Dr [name] , [facility]   TOTAL HIP ARTHROPLASTY Right 02/01/2016   Procedure: RIGHT TOTAL HIP ARTHROPLASTY ANTERIOR APPROACH;  Surgeon: Paralee Cancel, MD;  [facility];  Service: Orthopedics;  Laterality: Right;   TOTAL HIP ARTHROPLASTY Left 12/19/2016   Procedure: LEFT TOTAL HIP ARTHROPLASTY ANTERIOR APPROACH;  Surgeon: Paralee Cancel, MD;  [facility];  Service: Orthopedics;  Laterality: Left;   TOTAL HIP REVISION Right 11/11/2018   Procedure: right total hip arthroplasty revision, femoral stem;  Surgeon: Paralee Cancel, MD;  [facility];  Service: Orthopedics;  Laterality: Right;  70mn   TOTAL KNEE ARTHROPLASTY Left 11/27/2014   Procedure: LEFT TOTAL KNEE ARTHROPLASTY;  Surgeon: SAugustin Schooling MD;  [place]  Service: Orthopedics;  Laterality: Left;   TOTAL KNEE ARTHROPLASTY Right 12/16/2019   Procedure: TOTAL KNEE ARTHROPLASTY;  Surgeon: OParalee Cancel MD;  [facility];  Service: Orthopedics;  Laterality: Right;  70 mins   TRANSTHORACIC ECHOCARDIOGRAM  11/08/2005   EF 68%, normal LV systolic  function   UPPER GASTROINTESTINAL ENDOSCOPY  2010   Negative, Dr.[name]    Allergies  Allergies  Allergen Reactions   Dilaudid [Hydromorphone Hcl] Nausea And Vomiting    "Questionable as to whether it was actually dilaudid  That made me so severely nauseous and vomit"   Lipitor [Atorvastatin]     myalgia   Pitavastatin     Myalgia    Simvastatin     myalgia   Benazepril Hcl Cough    History of Present Illness    ***  Home Medications    Current Outpatient Medications  Medication Sig Dispense Refill   amoxicillin (AMOXIL) 500 MG capsule Take 4 capsules by mouth 1 hour prior to dental appointment. 8 capsule PRN    Ascorbic Acid (VITAMIN C) 1000 MG tablet Take 1,000 mg by mouth daily.     aspirin 81 MG chewable tablet      b complex vitamins tablet Take 2 tablets by mouth daily.      carboxymethylcellulose (REFRESH PLUS) 0.5 % SOLN INSTILL 1 DROP IN BOTH EYES FOUR TIMES A DAY     celecoxib (CELEBREX) 200 MG capsule Take 1 capsule by mouth daily as needed.     cyclobenzaprine (FLEXERIL) 10 MG tablet Take 1 tablet 3 times a day by oral route as needed. 30 tablet 1   diclofenac Sodium (VOLTAREN) 1 % GEL APPLY TO KNEE AND HIPS AS NEEDED 500 g 2   fluticasone (FLONASE) 50 MCG/ACT nasal spray Place 2 spray into both nostrils once daily. 16 g 6   hydrochlorothiazide (HYDRODIURIL) 25 MG tablet Take 1 tablet (25 mg total) by mouth daily. 90 tablet 3   levothyroxine (SYNTHROID) 125 MCG tablet Take 250 mcg by mouth daily before breakfast.     losartan (COZAAR) 100 MG tablet TAKE ONE-HALF TABLET BY MOUTH ONCE A DAY     Multiple Vitamin (MULTIVITAMIN) tablet Take 1 tablet by mouth every morning.     omeprazole (PRILOSEC) 20 MG capsule Take 1 capsule by mouth 2 (two) times daily.     pantoprazole (PROTONIX) 40 MG tablet Take 1 tablet by mouth 30 minutes before first and last meals of the day. 90 tablet 1   predniSONE (DELTASONE) 5 MG tablet Take as directed for 12 days 48 tablet 0   No current facility-administered medications for this visit.     Family History    Family History  Problem Relation Age of Onset   Heart failure Mother    Hypertension Mother    Thyroid disease Mother    Cirrhosis Father    Diabetes Father    Stroke Father    Heart attack Brother 66   Aneurysm Maternal Aunt         AAA   Coronary artery disease Maternal Uncle        MI late 41s   He indicated that his mother is deceased. He indicated that his father is deceased. He indicated that his brother is alive. He indicated that the status of his maternal aunt is unknown. He indicated that the status of his maternal uncle is  unknown.  Social History    Social History   Socioeconomic History   Marital status: Married    Spouse name: Levada Dy   Number of children: 3   Years of education: 12   Highest education level: Associate degree: occupational, Hotel manager, or vocational program  Occupational History   Not on file  Tobacco Use   Smoking status: Never   Smokeless tobacco:  Never   Tobacco comments:    Quit at age 32  Vaping Use   Vaping Use: Never used  Substance and Sexual Activity   Alcohol use: No    Comment: alcoholism quit 30 years ago    Drug use: No   Sexual activity: Yes  Other Topics Concern   Not on file  Social History Narrative   Lives with wife   No caffeine   Social Determinants of Health   Financial Resource Strain: Not on file  Food Insecurity: Not on file  Transportation Needs: Not on file  Physical Activity: Not on file  Stress: Not on file  Social Connections: Not on file  Intimate Partner Violence: Not on file     Review of Systems    General:  No chills, fever, night sweats or weight changes.  Cardiovascular:  No chest pain, dyspnea on exertion, edema, orthopnea, palpitations, paroxysmal nocturnal dyspnea. Dermatological: No rash, lesions/masses Respiratory: No cough, dyspnea Urologic: No hematuria, dysuria Abdominal:   No nausea, vomiting, diarrhea, bright red blood per rectum, melena, or hematemesis Neurologic:  No visual changes, wkns, changes in mental status. All other systems reviewed and are otherwise negative except as noted above.     Physical Exam    VS:  There were no vitals taken for this visit. , BMI There is no height or weight on file to calculate BMI.     GEN: Well nourished, well developed, in no acute distress. HEENT: normal. Neck: Supple, no JVD, carotid bruits, or masses. Cardiac: RRR, no murmurs, rubs, or gallops. No clubbing, cyanosis, edema.  Radials/DP/PT 2+ and equal bilaterally.  Respiratory:  Respirations regular and unlabored,  clear to auscultation bilaterally. GI: Soft, nontender, nondistended, BS + x 4. MS: no deformity or atrophy. Skin: warm and dry, no rash. Neuro:  Strength and sensation are intact. Psych: Normal affect.  Accessory Clinical Findings    ECG personally reviewed by me today- *** - No acute changes  Lab Results  Component Value Date   WBC 6.9 12/21/2021   HGB 13.9 12/21/2021   HCT 41.6 12/21/2021   MCV 88 12/21/2021   PLT 278 12/21/2021   Lab Results  Component Value Date   CREATININE 0.77 12/21/2021   BUN 14 12/21/2021   NA 137 12/21/2021   K 4.1 12/21/2021   CL 100 12/21/2021   CO2 25 12/21/2021   Lab Results  Component Value Date   ALT 19 12/21/2021   AST 16 12/21/2021   ALKPHOS 78 12/21/2021   BILITOT 0.3 12/21/2021   Lab Results  Component Value Date   CHOL 207 (H) 08/03/2021   HDL 43 08/03/2021   LDLCALC 143 (H) 08/03/2021   TRIG 117 08/03/2021   CHOLHDL 4.8 08/03/2021    Lab Results  Component Value Date   HGBA1C 5.1 12/21/2021    Review of Prior Studies: Coronary CTA  1. Coronary calcium score of 319. This was 87th percentile for age-, race-, and sex-matched controls.   2.  Dilated ascending aorta measuring 11m   NM Stress Test  08/30/2016 The left ventricular ejection fraction is normal (55-65%). Nuclear stress EF by computer estimation : 48%. Visually the EF is much better and is likely to be closer to 65%. There was no ST segment deviation noted during stress. The study is normal. This is a low risk study.  Assessment & Plan   1.  ***    Current medicines are reviewed at length with the patient today.  I  have spent *** min's  dedicated to the care of this patient on the date of this encounter to include pre-visit review of records, assessment, management and diagnostic testing,with shared decision making. Signed, Phill Myron. West Pugh, ANP, AACC   06/07/2022 3:54 PM    [facility] [place] [address] Office  [phone] Fax [phone]  Notice: This dictation was prepared with Dragon dictation along with smaller phrase technology. Any transcriptional errors that result from this process are unintentional and may not be corrected upon review.

## 2022-06-09 ENCOUNTER — Ambulatory Visit: Payer: No Typology Code available for payment source | Admitting: Adult Health

## 2022-06-14 ENCOUNTER — Encounter: Payer: Self-pay | Admitting: Adult Health

## 2022-06-22 ENCOUNTER — Ambulatory Visit (HOSPITAL_BASED_OUTPATIENT_CLINIC_OR_DEPARTMENT_OTHER): Payer: No Typology Code available for payment source | Attending: Family Medicine | Admitting: Physical Therapy

## 2022-06-22 DIAGNOSIS — M6281 Muscle weakness (generalized): Secondary | ICD-10-CM

## 2022-06-22 DIAGNOSIS — R262 Difficulty in walking, not elsewhere classified: Secondary | ICD-10-CM

## 2022-06-22 DIAGNOSIS — M5416 Radiculopathy, lumbar region: Secondary | ICD-10-CM

## 2022-06-22 DIAGNOSIS — R2689 Other abnormalities of gait and mobility: Secondary | ICD-10-CM

## 2022-06-23 ENCOUNTER — Encounter (HOSPITAL_BASED_OUTPATIENT_CLINIC_OR_DEPARTMENT_OTHER): Payer: Self-pay | Admitting: Physical Therapy

## 2022-06-23 NOTE — Therapy (Signed)
OUTPATIENT PHYSICAL THERAPY TREATMENT NOTE PHYSICAL THERAPY DISCHARGE SUMMARY  Visits from Start of Care: 14  Current functional level related to goals / functional outcomes: Indep with all functional mobility and ADL's with ad's as needed   Remaining deficits: Chronic pain   Education / Equipment: Management of condition/ HEP   Patient agrees to discharge. Patient goals were partially met. Patient is being discharged due to maximized rehab potential.     Patient Name: Samuel Goodwin MRN: [id number] DOB:[birth year], 61 y.o., male Today's Date: 06/23/2022  PCP: Tennis Must [place], Blondell Reveal, MD REFERRING PROVIDER: de [place], Raymond J, MD   PT End of Session - 06/23/22 1319     Visit Number 14    Number of Visits 15    Date for PT Re-Evaluation 06/23/22    Authorization Type [facility]    Authorization Time Period 09/16/21-01/14/22    PT Start Time 1701    PT Stop Time 1745    PT Time Calculation (min) 44 min    Equipment Utilized During Treatment Other (comment)    Activity Tolerance Patient tolerated treatment well    Behavior During Therapy WFL for tasks assessed/performed                Past Medical History:  Diagnosis Date   Alcohol abuse    Sobriety since 1990   Anxiety    Arthritis    oa   Benign positional vertigo    Depression    Eardrum trauma    30 years ago/right ear   GERD (gastroesophageal reflux disease)    Headache    sinus   Hepatitis C 1994   hep c - ? 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   COLONOSCOPY  03/2013   HIP CLOSED REDUCTION Right 02/05/2016   Procedure: CLOSED MANIPULATION HIP;  Surgeon: Latanya Maudlin, MD;  Location: [facility];  Service: Orthopedics;  Laterality: Right;   knee arthroscopic Bilateral    LARYNGOSCOPY  08/2009   Dr.[name]   NASAL FRACTURE SURGERY     age 26   right knee arthroscopic knee surgery  12 yrs ago   dr Theda Sers   THROAT SURGERY  aug, sept, Nov 16 2016   vocal cord  laser sugery X 3, Dr [name] , [facility]   TOTAL HIP ARTHROPLASTY Right 02/01/2016   Procedure: RIGHT TOTAL HIP ARTHROPLASTY ANTERIOR APPROACH;  Surgeon: Paralee Cancel, MD;  Location: [facility];  Service: Orthopedics;  Laterality: Right;   TOTAL HIP ARTHROPLASTY Left 12/19/2016   Procedure: LEFT TOTAL HIP ARTHROPLASTY ANTERIOR APPROACH;  Surgeon: Paralee Cancel, MD;  Location: [facility];  Service:  Orthopedics;  Laterality: Left;   TOTAL HIP REVISION Right 11/11/2018   Procedure: right total hip arthroplasty revision, femoral stem;  Surgeon: Paralee Cancel, MD;  Location: [facility];  Service: Orthopedics;  Laterality: Right;  18mn   TOTAL KNEE ARTHROPLASTY Left 11/27/2014   Procedure: LEFT TOTAL KNEE ARTHROPLASTY;  Surgeon: SAugustin Schooling MD;  [place] [facility]  Service: Orthopedics;  Laterality: Left;   TOTAL KNEE ARTHROPLASTY Right 12/16/2019   Procedure: TOTAL KNEE ARTHROPLASTY;  Surgeon: OParalee Cancel MD;  Location: [facility];  Service: Orthopedics;  Laterality: Right;  70 mins   TRANSTHORACIC ECHOCARDIOGRAM  11/08/2005   EF 68%, normal LV systolic function   UPPER GASTROINTESTINAL ENDOSCOPY  2010   Negative, Dr.[name]   Patient Active Problem List   Diagnosis Date Noted   Pneumonia  due to COVID-19 virus 10/08/2020   Upper airway cough syndrome 10/08/2020   S/P right TKA 12/16/2019   Status post total knee replacement, right 12/16/2019   [place] ([place] 11/13/2018   Chronic anemia 11/06/2018   Reactive airway disease 11/06/2018   [place] 11/05/[place]   [place]bid [place] ([place] 12/20/2016   S/P left THA, AA 12/19/2016   Chest pain 08/15/2016   S/P right TH revision 02/09/2016   Hip instability 02/05/2016   H/O total hip arthroplasty 02/05/2016   S/P right THA, AA 02/01/2016   S/P knee replacement 11/27/2014   Unspecified viral hepatitis C without hepatic coma 08/28/2014   DYSPHONIA, CHRONIC 01/06/2010   NONSPECIFIC ABNORMAL ELECTROCARDIOGRAM 01/06/2010   HOARSENESS, CHRONIC 05/31/2009   Hypothyroidism 04/09/2009   Hyperlipidemia LDL goal <100 04/09/2009   HTN (hypertension) 04/09/2009   GERD (gastroesophageal reflux disease) 04/09/2009   Sleep apnea 04/09/2009     REFERRING PROVIDER: de [place] Raymond J, MD   REFERRING DIAG: Diagnosis G83.10 (ICD-10-CM) - 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  Coming and doing HEP as I know it weekly:  PERTINENT HISTORY:  hypertension, OSA, , hyperlipidemia, chest pain, morbid obesity.,throat cancer, chronic pain syndrome, degenerative changes in cervical and lumbar spine, PTSD, TBI, tinnitus, bilateral knee replacements, bilateral hip  replacements, right hip replacement hardware  failure and revision.     PAIN:  Are you having pain? Yes NPRS scale: 1/10 (back),  5/10 (post neck), 7/10 knees  Pain location: see above Pain orientation: see above PAIN TYPE: aching, tight, occasional jabbing Pain description: constant  Aggravating factors: walking Relieving factors: sitting, resting   PRECAUTIONS: Back and Fall   WEIGHT BEARING RESTRICTIONS No   FALLS:  Has patient fallen in last 6 months? Yes, Number of falls: 2   LIVING ENVIRONMENT: Lives with: lives with their family Lives in: House/apartment Stairs: Yes; Internal: 2 steps; none Has following equipment at home: Single point cane   OCCUPATION: manager/sits a desk   PLOF: Independent with basic ADLs   PATIENT GOALS walk straighter     OBJECTIVE:   * All objective findings take at Evaluation unless otherwise noted.   DIAGNOSTIC FINDINGS:  Neg blood clots LE   PATIENT SURVEYS:  FOTO 32 with goal of 43 04/11/22: 38   SCREENING FOR RED FLAGS:           neg   COGNITION:          Overall cognitive status: Within functional limits for tasks assessed                        SENSATION: numbness, burning and tingling in bilateral feet.          Light touch: Deficits feet             MUSCLE LENGTH: Hamstrings: Right 80 deg; Left 60 deg     POSTURE:  Right hip elevation, flattened lumbar spine   PALPATION: Lt post innominate rotation   LUMBARAROM/PROM   A/PROM A/PROM  02/14/2022  Flexion 18 inches fingertips to floor  Extension -10  Right lateral flexion    Left lateral flexion    Right rotation    Left rotation     (Blank rows = not tested)   LE AROM/PROM:   A/PROM Right 02/14/2022 Left 02/14/2022 Right 04/11/22 Left 04/11/22 Right  Hip flexion 90 110 95 110 99  Hip extension         Hip abduction         Hip adduction         Hip internal rotation         Hip external rotation         Knee flexion wfl wfl     Knee extension         Ankle dorsiflexion 5 30 6     Ankle  plantarflexion         Ankle inversion         Ankle eversion          (Blank rows = not tested)   LE MMT:   MMT Right 02/14/2022 Left 02/14/2022 Right Left 04/11/22 Right  Hip flexion 22.7 47.78 22.7 48.5 32.4  Hip extension 27 31 32.5 34.8   Hip abduction 26.0 49.2 25.6 52.3 34.62  Hip adduction         Hip internal rotation         Hip external rotation         Knee flexion 35.6 43.5 24.1 38.6   Knee extension         Ankle dorsiflexion         Ankle plantarflexion         Ankle inversion         Ankle  eversion          (Blank rows = not tested)   LUMBAR SPECIAL TESTS:  Straight leg raise test: Negative, Slump test: Negative, and Long sit test: Negative   FUNCTIONAL TESTS:  6 minute walk test 655  5x STS test  17s  04/11/22 20s    GAIT: Distance walked: 655 Assistive device utilized: None Level of assistance: Complete Independence Comments: antalgic, significant lateral displacement increasing as distance increases, left Trendelenburg.        TODAY'S TREATMENT   Pt seen for aquatic therapy today.  Treatment took place in water 3.25-4.8 ft in depth at the [facility] pool. Temp of water was 91.  Pt entered/exited the pool via stairs step to pattern independently with bilat rail.   Warm up:  walking multiple widths and lengths In 4.8 ft of water: QL stretching 4 x 30s hold Supine on yellow noodle behind back with ankle buoys for resistance- hip extension , alternating knee flex and knee ext with DF , hip abdct/add  ;    UE supported by wall  -Standing Hip Abduction Adduction at [facility]   - Standing Hip Flexion Extension at [facility]   - Standing March at [place] using 2 foam hand buoys:  - Asymmetrical Shoulder Flexion Extension with Hand Floats   - Bilateral Shoulder Horizontal Abduction Adduction with Hand Floats  - Bilateral Shoulder Adduction Abduction with Hand Floats    Side to side pendulum Forward to back  pendulums  Stretching: "Runners stretch" hamstring and gastroc  Lumbar spine into flex Hip flex  Pt instructed on variations to increase/decrease challenge with all of the above  Pt requires buoyancy for support and to offload joints with strengthening exercises. Viscosity of the water is needed for resistance of strengthening; water current perturbations provides challenge to standing balance unsupported, requiring increased core activation.        PATIENT EDUCATION:  Education details: re-ed on HEP Person educated: Patient Education method: Explanation Education comprehension: verbalized understanding     HOME EXERCISE PROGRAM: [id number] Added 04/25/22  Standing Hip Abduction Adduction at [facility]   - Standing Hip Flexion Extension at [facility]   - Standing March at [facility]   - Asymmetrical Shoulder Flexion Extension with Hand Floats   - Bilateral Shoulder Horizontal Abduction Adduction with Hand Floats  - Bilateral Shoulder Adduction Abduction with Hand Floats     ASSESSMENT:   CLINICAL IMPRESSION: Pt with difficulty scheduling appointments to be seen due to conflicts in his and therapists schedules.  Today was soonest we could accommodate each other. I will get clearance for this visit in new certification period.  He requests session due to wanting clarity on final HEP.  He is instructed through 2 different laminated programs one "easy" other "more difficult". He continues to have chronic pain which at times causes weakness in LB/LE's.  He has kept up with his HEP throughout the past many certification periods he has had and demonstrates the indep ability to modify HEP appropriately to accommodate for changes in his pain and ability.  He has now a hard copy of all of the exercises/challenges we have covered. He has not met all of his goals but is clearly at his maximal potential.  DC      Pt with inconsistent muscle strength results and slightly decreased Tug score  probably due to his unpredictable good vs bad days. Does have and improvement on his Foto but  ot  yet reached goal. He has better toleration to his work and home schedules as he has learned to modify his activity (no lifting heavy objects, cutting down trees, walking over uneven and hilly surfaces, rising slowly from chairs, using cane or walking sticks, pushing carts in grocery stores), improving his management of his chronic conditions.  We are establishing a wide variety of exercises on level from less difficult to more challenging which he engages in during sessions modifying as we need according to his "day".  I do believe pt is progressing toward his maximal potential with aquatic therapy. He was hoping to gain more strength but in past episodes the progression to higher level strengthening increased pain and decreased his toleration to daily living.  We will continue instructing him on progression of therapy/HEP determined by his day to day toleration to ADL's and functional mobility.  Anticipate dc in 1 month.      OBJECTIVE IMPAIRMENTS Abnormal gait, decreased activity tolerance, decreased balance, decreased endurance, decreased mobility, difficulty walking, and decreased strength.    ACTIVITY LIMITATIONS cleaning.    PERSONAL FACTORS Past/current experiences and 3+ comorbidities: bilat hip replacements, knee replacement DDD spine  are also affecting patient's functional outcome.      REHAB POTENTIAL: Good   CLINICAL DECISION MAKING: Evolving/moderate complexity   EVALUATION COMPLEXITY: Moderate     GOALS: Goals reviewed with patient? Yes   SHORT TERM GOALS:   Pt will be indep with initial HEP to improve management of condition Baseline: not completing Target date: 03/14/2022 Goal status: Achieved   2.  Pt will improve right hip flex and abd strength by 5lbs Baseline: R hip flex 22.7; R hip abd 26 Target date: 03/14/2022 Goal status: Achieved   3.  Pt will improve on 5x STS  test to 12s or less Baseline: 17s Target date: 03/14/2022 Goal status: Not met (Unable to test due to pain in right hip when exiting pool)       LONG TERM GOALS:   Pt will improve on 6 minute walk test to 1000 ft to demonstrate improved toleration to amb. Baseline: 655 ft Target date: 05/09/2022 Goal status: Not Met (Unable to test due to pain in right hip when exiting pool   2.  Pt will improve R hip flex to 105d to demonstrate improved movement Baseline: 90 Target date: 05/09/2022 Goal status: Improved but not met   3.  Pt will improve on Foto to 43 to demonstrate improved function status Baseline: 32 Target date: 05/09/2022 Goal status: Progressed but not completed at DC   4.  Pt will be indep with final HEP for long term management of condition Baseline:Achieved Target date: 05/09/2022     PLAN: PT FREQUENCY: 1x/week   PT DURATION: 4 weeks   PLANNED INTERVENTIONS: Therapeutic exercises, Therapeutic activity, Neuromuscular re-education, Balance training, Gait training, Patient/Family education, Joint mobilization, Stair training, Aquatic Therapy, Taping, and Manual therapy   PLAN FOR NEXT SESSION: instruction on final HEP.   Stanton Kidney Tharon Aquas) Ahilyn Nell MPT 04/11/22 1:53 PM

## 2022-06-26 ENCOUNTER — Other Ambulatory Visit: Payer: Self-pay | Admitting: Orthopedic Surgery

## 2022-06-26 ENCOUNTER — Other Ambulatory Visit (HOSPITAL_COMMUNITY): Payer: Self-pay | Admitting: Orthopedic Surgery

## 2022-06-26 DIAGNOSIS — M25562 Pain in left knee: Secondary | ICD-10-CM

## 2022-06-27 ENCOUNTER — Ambulatory Visit (HOSPITAL_BASED_OUTPATIENT_CLINIC_OR_DEPARTMENT_OTHER): Payer: No Typology Code available for payment source | Admitting: Physical Therapy

## 2022-07-06 ENCOUNTER — Encounter (HOSPITAL_COMMUNITY)
Admission: RE | Admit: 2022-07-06 | Discharge: 2022-07-06 | Disposition: A | Discharge status: Home / Self Care | Payer: No Typology Code available for payment source | Source: Ambulatory Visit | Attending: Orthopedic Surgery | Admitting: Orthopedic Surgery

## 2022-07-06 DIAGNOSIS — M25562 Pain in left knee: Secondary | ICD-10-CM | POA: Insufficient documentation

## 2022-07-06 MED ORDER — TECHNETIUM TC 99M MEDRONATE IV KIT
20.0000 | PACK | Freq: Once | INTRAVENOUS | Status: AC | PRN
  Administered 2022-07-06: 20 via INTRAVENOUS

## 2022-07-13 ENCOUNTER — Ambulatory Visit (HOSPITAL_BASED_OUTPATIENT_CLINIC_OR_DEPARTMENT_OTHER): Payer: No Typology Code available for payment source

## 2022-07-13 DIAGNOSIS — I10 Essential (primary) hypertension: Secondary | ICD-10-CM

## 2022-07-13 DIAGNOSIS — E039 Hypothyroidism, unspecified: Secondary | ICD-10-CM

## 2022-07-13 DIAGNOSIS — Z0000 Encounter for general adult medical examination without abnormal findings: Secondary | ICD-10-CM

## 2022-07-14 LAB — CBC WITH DIFFERENTIAL/PLATELET
Basophils Absolute: 0 10*3/uL (ref 0.0–0.2)
Basos: 1 %
EOS (ABSOLUTE): 0.1 10*3/uL (ref 0.0–0.4)
Eos: 2 %
Hematocrit: 41.1 % (ref 37.5–51.0)
Hemoglobin: 13.9 g/dL (ref 13.0–17.7)
Immature Grans (Abs): 0 10*3/uL (ref 0.0–0.1)
Immature Granulocytes: 0 %
Lymphocytes Absolute: 1.3 10*3/uL (ref 0.7–3.1)
Lymphs: 23 %
MCH: 29.8 pg (ref 26.6–33.0)
MCHC: 33.8 g/dL (ref 31.5–35.7)
MCV: 88 fL (ref 79–97)
Monocytes Absolute: 0.4 10*3/uL (ref 0.1–0.9)
Monocytes: 7 %
Neutrophils Absolute: 3.6 10*3/uL (ref 1.4–7.0)
Neutrophils: 67 %
Platelets: 217 10*3/uL (ref 150–450)
RBC: 4.67 x10E6/uL (ref 4.14–5.80)
RDW: 13.1 % (ref 11.6–15.4)
WBC: 5.5 10*3/uL (ref 3.4–10.8)

## 2022-07-14 LAB — COMPREHENSIVE METABOLIC PANEL
ALT: 21 IU/L (ref 0–44)
AST: 19 IU/L (ref 0–40)
Albumin/Globulin Ratio: 1.6 (ref 1.2–2.2)
Albumin: 4.2 g/dL (ref 3.8–4.9)
Alkaline Phosphatase: 73 IU/L (ref 44–121)
BUN/Creatinine Ratio: 12 (ref 10–24)
BUN: 11 mg/dL (ref 8–27)
Bilirubin Total: 0.3 mg/dL (ref 0.0–1.2)
CO2: 26 mmol/L (ref 20–29)
Calcium: 8.8 mg/dL (ref 8.6–10.2)
Chloride: 101 mmol/L (ref 96–106)
Creatinine, Ser: 0.89 mg/dL (ref 0.76–1.27)
Globulin, Total: 2.6 g/dL (ref 1.5–4.5)
Glucose: 95 mg/dL (ref 70–99)
Potassium: 4.6 mmol/L (ref 3.5–5.2)
Sodium: 140 mmol/L (ref 134–144)
Total Protein: 6.8 g/dL (ref 6.0–8.5)
eGFR: 98 mL/min/{1.73_m2} (ref >59)

## 2022-07-14 LAB — LIPID PANEL
Chol/HDL Ratio: 4.3 ratio (ref 0.0–5.0)
Cholesterol, Total: 204 mg/dL — ABNORMAL HIGH (ref 100–199)
HDL: 47 mg/dL (ref >39)
LDL Chol Calc (NIH): 136 mg/dL — ABNORMAL HIGH (ref 0–99)
Triglycerides: 114 mg/dL (ref 0–149)
VLDL Cholesterol Cal: 21 mg/dL (ref 5–40)

## 2022-07-14 LAB — HEMOGLOBIN A1C
Est. average glucose Bld gHb Est-mCnc: 103 mg/dL
Hgb A1c MFr Bld: 5.2 % (ref 4.8–5.6)

## 2022-07-14 LAB — TSH: TSH: 1.56 u[IU]/mL (ref 0.450–4.500)

## 2022-07-20 ENCOUNTER — Encounter (HOSPITAL_BASED_OUTPATIENT_CLINIC_OR_DEPARTMENT_OTHER): Payer: No Typology Code available for payment source | Admitting: Family Medicine

## 2022-08-15 ENCOUNTER — Encounter (HOSPITAL_BASED_OUTPATIENT_CLINIC_OR_DEPARTMENT_OTHER): Payer: No Typology Code available for payment source | Admitting: Family Medicine

## 2022-09-06 ENCOUNTER — Other Ambulatory Visit (HOSPITAL_BASED_OUTPATIENT_CLINIC_OR_DEPARTMENT_OTHER): Payer: Self-pay

## 2022-09-13 ENCOUNTER — Other Ambulatory Visit (HOSPITAL_BASED_OUTPATIENT_CLINIC_OR_DEPARTMENT_OTHER): Payer: Self-pay

## 2022-09-23 ENCOUNTER — Other Ambulatory Visit (HOSPITAL_BASED_OUTPATIENT_CLINIC_OR_DEPARTMENT_OTHER): Payer: Self-pay

## 2022-11-07 ENCOUNTER — Other Ambulatory Visit (HOSPITAL_BASED_OUTPATIENT_CLINIC_OR_DEPARTMENT_OTHER): Payer: Self-pay

## 2022-11-07 MED ORDER — AMOXICILLIN 500 MG PO CAPS
2000.0000 mg | ORAL_CAPSULE | ORAL | 99 refills | Status: DC
  Filled 2022-11-07: qty 8, 2d supply, fill #0

## 2023-01-05 ENCOUNTER — Telehealth: Payer: Self-pay | Admitting: Cardiovascular Disease

## 2023-01-05 NOTE — Telephone Encounter (Signed)
Patient wants to make sure his appointments with Dr. Gwenlyn Found and Dr. Claiborne Billings in February will be covered by the [facility], can you assist with this?

## 2023-01-11 NOTE — Telephone Encounter (Signed)
Pt is returning call to get update on [place] coverage. He states [facility] or community care has not received anything.   [facility] Fax # [phone]

## 2023-01-12 ENCOUNTER — Other Ambulatory Visit (HOSPITAL_BASED_OUTPATIENT_CLINIC_OR_DEPARTMENT_OTHER): Payer: Self-pay

## 2023-01-12 MED ORDER — PRAZIQUANTEL 600 MG PO TABS
ORAL_TABLET | ORAL | 0 refills | Status: DC
  Filled 2023-01-12: qty 6, 14d supply, fill #0

## 2023-01-15 ENCOUNTER — Telehealth: Payer: Self-pay | Admitting: Cardiovascular Disease

## 2023-01-15 ENCOUNTER — Other Ambulatory Visit: Payer: Self-pay

## 2023-01-15 ENCOUNTER — Other Ambulatory Visit (HOSPITAL_BASED_OUTPATIENT_CLINIC_OR_DEPARTMENT_OTHER): Payer: Self-pay

## 2023-01-15 NOTE — Telephone Encounter (Signed)
Pt is requesting a call back to get information on a nutritionist that Dr. Gwenlyn Found has suggested to the patient in the past. Please advise.

## 2023-01-17 ENCOUNTER — Other Ambulatory Visit: Payer: Self-pay

## 2023-01-17 NOTE — Telephone Encounter (Signed)
Patient is following up. He would like to know if we have any updates regarding nutritionist recommendations.  Patient is also requesting that an email be sent to the [place] regarding follow up care for this year. He states the e-mail would be pertaining to necessity for his yearly follow up appointment with Dr. Gwenlyn Found and his sleep appointment with Dr. Claiborne Billings.  E-mail provided by the patient:  [email or web address]

## 2023-01-17 NOTE — Telephone Encounter (Signed)
Called patient, advised I could send him the referral to community health and wellness-   Patient verbalized understanding.   Altha Harm, looks like this was the information you all needed from precert- thanks!

## 2023-02-02 ENCOUNTER — Ambulatory Visit
Payer: No Typology Code available for payment source | Attending: Cardiovascular Disease | Admitting: Cardiovascular Disease

## 2023-02-02 ENCOUNTER — Encounter: Payer: Self-pay | Admitting: Cardiovascular Disease

## 2023-02-02 VITALS — BP 118/78 | HR 72 | Ht 72.0 in | Wt 282.2 lb

## 2023-02-02 DIAGNOSIS — I10 Essential (primary) hypertension: Secondary | ICD-10-CM | POA: Diagnosis not present

## 2023-02-02 DIAGNOSIS — E7800 Pure hypercholesterolemia, unspecified: Secondary | ICD-10-CM | POA: Diagnosis not present

## 2023-02-02 DIAGNOSIS — E785 Hyperlipidemia, unspecified: Secondary | ICD-10-CM

## 2023-02-02 DIAGNOSIS — G4733 Obstructive sleep apnea (adult) (pediatric): Secondary | ICD-10-CM

## 2023-02-02 NOTE — Patient Instructions (Signed)
Medication Instructions:   No changes  *If you need a refill on your cardiac medications before your next appointment, please call your pharmacy*   Lab Work: Not needed .   Testing/Procedures: Not needed   Follow-Up: At [facility], you and your health needs are our priority.  As part of our continuing mission to provide you with exceptional heart care, we have created designated Provider Care Teams.  These Care Teams include your primary Cardiologist (physician) and Advanced Practice Providers (APPs -  Physician Assistants and Nurse Practitioners) who all work together to provide you with the care you need, when you need it.     Your next appointment:   4 to 5 month(s)  The format for your next appointment:   In Person  Provider:   Dr Shelva Majestic   Other Instructions   Will need to order your new machine through the [facility]. AirCurve 11  Settings E-PAP 15 Pressure 4  I PAP 25

## 2023-02-02 NOTE — Progress Notes (Signed)
Cardiology Office Note    Date:  02/06/2023   ID:  Samuel Goodwin, Samuel Goodwin [birth year], MRN [id number]  PCP:  de Peru, Raymond J, MD  Cardiologist:  Nicki Guadalajara, MD (sleep); Dr. Allyson Sabal  New sleep consultation and evaluation   History of Present Illness:  Samuel Goodwin is a 62 y.o. male who previously served in the [employer] for 4 and half years while in [place].  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  He had multiple injuries and continues to be followed at the [place].  With hiis ongoing orthopedic issues, he goes to the pool for aquatic therapy 5 days/week.  He has a history of obesity, hypertension, hypothyroidism, and obstructive sleep apnea.  In 2012 had undergone initial sleep evaluation at the [facility] heart and sleep center which showed moderate overall sleep apnea with an AHI of 16.9/h but during REM sleep sleep apnea was very severe with an AHI of 60.6/h and events were even more severe with supine sleep AHI 82.7/h.  The lowest oxygen desaturation during non-REM sleep was 79% and with REM sleep was 74%.  He had loud snoring.  Following that sleep study, he ultimately initiated BiPAP therapy.  I do not have any additional records.  He apparently received a new ResMed air curve 10 via auto BiPAP machine in December 2017.  He has not been evaluated in sleep clinic.  Recently, he has noticed that his machine is cutting off in the middle of the night.  He typically goes to bed around 1030 and wakes up at 4:45 AM.  He admits to continued use with his BiPAP machine.  A download was obtained from January 24 through February 01, 2021 which shows 100% usage.  Average use 6 hours and 31 minutes.  His BiPAP is set at a set pressure of 19/15.  AHI is 1.1.  He has had multiple orthopedic issues has had bilateral knee and hip surgeries.  He wears knee braces for support.  He also has a history of vocal cord carcinoma.  He sees Dr. de Peru for primary care.  He is now referred for sleep  consultation and evaluation.  Presently, for hypertension he is on losartan 50 mg daily and HCTZ 25 mg.  He is on levothyroxine 250 mcg daily.  He takes a baby aspirin.  He has GERD result and pantoprazole are on his med list.  Past Medical History:  Diagnosis Date   Alcohol abuse    Sobriety since 1990   Anxiety    Arthritis    oa   Benign positional vertigo    Depression    Eardrum trauma    30 years ago/right ear   GERD (gastroesophageal reflux disease)    Headache    sinus   Hepatitis C 1994   hep c - ? 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   COLONOSCOPY  03/2013   HIP CLOSED REDUCTION Right 02/05/2016   Procedure: CLOSED MANIPULATION HIP;  Surgeon: Ranee Gosselin, MD;  Location: [facility];  Service: Orthopedics;  Laterality: Right;   knee arthroscopic Bilateral    LARYNGOSCOPY  08/2009   Dr.[name]   NASAL FRACTURE SURGERY     age 44   right knee arthroscopic knee surgery  12 yrs ago   dr Thomasena Edis   THROAT SURGERY  aug, sept, Nov 16 2016   vocal cord  laser sugery X 3, Dr Delford Field , [facility]   TOTAL HIP ARTHROPLASTY Right 02/01/2016   Procedure: RIGHT TOTAL HIP ARTHROPLASTY ANTERIOR APPROACH;  Surgeon: Durene Romans, MD;  Location: [facility];  Service: Orthopedics;  Laterality: Right;   TOTAL HIP ARTHROPLASTY Left 12/19/2016   Procedure: LEFT TOTAL HIP ARTHROPLASTY ANTERIOR APPROACH;  Surgeon: Durene Romans, MD;  Location: [facility];  Service: Orthopedics;  Laterality: Left;   TOTAL HIP REVISION Right 11/11/2018   Procedure: right total hip arthroplasty revision, femoral stem;  Surgeon: Durene Romans, MD;  Location: [facility];  Service: Orthopedics;  Laterality: Right;    TOTAL KNEE ARTHROPLASTY Left 11/27/2014   Procedure: LEFT TOTAL KNEE ARTHROPLASTY;  Surgeon: Verlee Rossetti, MD;  Location: [facility];  Service: Orthopedics;  Laterality: Left;   TOTAL KNEE ARTHROPLASTY Right 12/16/2019   Procedure: TOTAL KNEE ARTHROPLASTY;  Surgeon: Durene Romans, MD;  Location: [facility];  Service: Orthopedics;  Laterality: Right;  70 mins   TRANSTHORACIC ECHOCARDIOGRAM  11/08/2005   EF 68%, normal LV systolic function   UPPER GASTROINTESTINAL ENDOSCOPY  2010   Negative, Dr.[name]    Current Medications: Outpatient Medications Prior to Visit  Medication Sig Dispense Refill   amoxicillin (AMOXIL) 500 MG capsule Take 4 capsules by mouth 1 hour prior to dental appointment. 8 capsule PRN   amoxicillin (AMOXIL) 500 MG capsule Take 4 capsules (2,000 mg total) by mouth 1 hour prior to appointment. 8 capsule PRN   Ascorbic Acid (VITAMIN C) 1000 MG tablet Take 1,000 mg by mouth daily.     aspirin 81 MG chewable tablet      b complex vitamins tablet Take 2 tablets by mouth daily.      carboxymethylcellulose (REFRESH PLUS) 0.5 % SOLN INSTILL 1 DROP IN BOTH EYES FOUR TIMES A DAY     celecoxib (CELEBREX) 200 MG capsule Take 1 capsule by mouth daily as needed.     cyclobenzaprine (FLEXERIL) 10 MG tablet Take 1 tablet 3 times a day by oral route as needed. 30 tablet 1   diclofenac Sodium (VOLTAREN) 1 % GEL APPLY TO KNEE AND HIPS AS NEEDED 500 g 2   fluticasone (FLONASE) 50 MCG/ACT nasal spray Place 2 spray into both nostrils once daily.  16 g 6   hydrochlorothiazide (HYDRODIURIL) 25 MG tablet Take 1 tablet (25 mg total) by mouth daily. 90 tablet 3   levothyroxine (SYNTHROID) 125 MCG tablet Take 250 mcg by mouth daily before breakfast.     losartan (COZAAR) 100 MG tablet TAKE ONE-HALF TABLET BY MOUTH ONCE A DAY     Multiple Vitamin (MULTIVITAMIN) tablet Take 1 tablet by mouth every morning.     omeprazole (PRILOSEC) 20 MG  capsule Take 1 capsule by mouth 2 (two) times daily.     pantoprazole (PROTONIX) 40 MG tablet Take 1 tablet by mouth 30 minutes before first and last meals of the day. 90 tablet 1   praziquantel (BILTRICIDE) 600 MG tablet Take one tablet by mouth three times daily for 1 day, repeat dose in 2-4 weeks 6 tablet 0   predniSONE (DELTASONE) 5 MG tablet Take as directed for 12 days (Patient not taking: Reported on 02/02/2023) 48 tablet 0   No facility-administered medications prior to visit.     Allergies:   Dilaudid [hydromorphone hcl], Lipitor [atorvastatin], Pitavastatin, Simvastatin, and Benazepril hcl   Social History   Socioeconomic History   Marital status: Married    Spouse name: [name]   Number of children: 3   Years of education: 12   Highest education level: Associate degree: occupational, Scientist, product/process development, or vocational program  Occupational History   Not on file  Tobacco Use   Smoking status: Never   Smokeless tobacco: Never   Tobacco comments:    Quit at age 47  Vaping Use   Vaping Use: Never used  Substance and Sexual Activity   Alcohol use: No    Comment: alcoholism quit 30 years ago    Drug use: No   Sexual activity: Yes  Other Topics Concern   Not on file  Social History Narrative   Lives with wife   No caffeine   Social Determinants of Health   Financial Resource Strain: Not on file  Food Insecurity: Not on file  Transportation Needs: Not on file  Physical Activity: Not on file  Stress: Not on file  Social Connections: Not on file    Socially he was born in [place]  and went to [employer].  He is married for 34 years.  There is a prior tobacco history.   Family History:  The patient's family history includes Aneurysm in his maternal aunt; Cirrhosis in his father; Coronary artery disease in his maternal uncle; Diabetes in his father; Heart attack (age of onset: 68) in his brother; Heart failure in his mother; Hypertension in his mother; Stroke in his father; Thyroid disease in his mother.   ROS General: Negative; No fevers, chills, or night sweats;  HEENT: Negative; No changes in vision or hearing, sinus congestion, difficulty swallowing Pulmonary: Negative; No cough, wheezing, shortness of breath, hemoptysis Cardiovascular: Negative; No chest pain, presyncope, syncope, palpitations GI: Negative; No nausea, vomiting, diarrhea, or abdominal pain GU: Negative; No dysuria, hematuria, or difficulty voiding Musculoskeletal: Bilateral hip and knee surgeries, wears knee braces bilaterally Hematologic/Oncology: Negative; no easy bruising, bleeding Endocrine: Negative; no heat/cold intolerance; no diabetes Neuro: Negative; no changes in balance, headaches Skin: Negative; No rashes or skin lesions Psychiatric: Negative; No behavioral problems, depression Sleep: Obstructive sleep apnea originally diagnosed in 2012 with replacement machine on December 09, 2016  He is unaware of any breakthrough snoring.  An Epworth Sleepiness Scale score was calculated in the office today and this endorsed at 11 as shown below:  Epworth Sleepiness Scale: Situation   Chance of Dozing/Sleeping (0 = never , 1 = slight chance , 2 = moderate chance , 3 = high chance )   sitting and reading 3   watching TV 3   sitting inactive in a public place 0   being a passenger in a motor vehicle for an hour or more 1   lying down in the afternoon 2   sitting and talking to  someone 2   sitting quietly after lunch (no alcohol) 0   while stopped for a few minutes in traffic as the  driver 0   Total Score  11     Other comprehensive 14 point system review is negative.   PHYSICAL EXAM:   VS:  BP 118/78 (BP Location: Left Arm, Patient Position: Sitting, Cuff Size: Normal)   Pulse 72   Ht 6' (1.829 m)   Wt 282 lb 3.2 oz (128 kg)   SpO2 95%   BMI 38.27 kg/m     Repeat blood pressure by me was 128/76  Wt Readings from Last 3 Encounters:  02/02/23 282 lb 3.2 oz (128 kg)  01/20/22 296 lb (134.3 kg)  12/21/21 294 lb (133.4 kg)    General: Alert, oriented, no distress.  Skin: normal turgor, no rashes, warm and dry HEENT: Normocephalic, atraumatic. Pupils equal round and reactive to light; sclera anicteric; extraocular muscles intact;  Nose without nasal septal hypertrophy Mouth/Parynx; thick neck;  Mallinpatti scale 3/4 Neck: No JVD, no carotid bruits; normal carotid upstroke Lungs: clear to ausculatation and percussion; no wheezing or rales Chest wall: without tenderness to palpitation Heart: PMI not displaced, RRR, s1 s2 normal, 1/6 systolic murmur, no diastolic murmur, no rubs, gallops, thrills, or heaves Abdomen: soft, nontender; no hepatosplenomehaly, BS+; abdominal aorta nontender and not dilated by palpation. Back: no CVA tenderness Pulses 2+ Musculoskeletal: Bilateral knee braces Extremities: no clubbing cyanosis or edema, Homan's sign negative  Neurologic: grossly nonfocal; Cranial nerves grossly wnl Psychologic: Normal mood and affect   Studies/Labs Reviewed:   February 02, 2023 ECG (independently read by me): NSR at 72, no ectopy, normal intervals  Recent Labs:    Latest Ref Rng & Units 07/13/2022    8:07 AM 12/21/2021    9:42 AM 08/23/2020    5:23 PM  BMP  Glucose 70 - 99 mg/dL 95  91  91   BUN 8 - 27 mg/dL 11  14  12    Creatinine 0.76 - 1.27 mg/dL 6.29  5.28  4.13   BUN/Creat Ratio 10 - 24 12  18     Sodium 134 - 144 mmol/L 140  137  141   Potassium 3.5 - 5.2 mmol/L 4.6  4.1  3.7   Chloride 96 - 106 mmol/L 101  100  101   CO2 20 - 29  mmol/L 26  25  26    Calcium 8.6 - 10.2 mg/dL 8.8  9.3  9.0         Latest Ref Rng & Units 07/13/2022    8:07 AM 12/21/2021    9:42 AM 01/14/2021    8:47 AM  Hepatic Function  Total Protein 6.0 - 8.5 g/dL 6.8  6.9  6.9   Albumin 3.8 - 4.9 g/dL 4.2  4.4  4.4   AST 0 - 40 IU/L 19  16  20    ALT 0 - 44 IU/L 21  19  25    Alk Phosphatase 44 - 121 IU/L 73  78  70   Total Bilirubin 0.0 - 1.2 mg/dL 0.3  0.3  0.3   Bilirubin, Direct 0.00 - 0.40 mg/dL   2.44        Latest Ref Rng & Units 07/13/2022    8:07 AM 12/21/2021    9:42 AM 08/23/2020    5:23 PM  CBC  WBC 3.4 - 10.8 x10E3/uL 5.5  6.9  5.7   Hemoglobin 13.0 - 17.7 g/dL 01.0  27.2  13.3  Hematocrit 37.5 - 51.0 % 41.1  41.6  38.4   Platelets 150 - 450 x10E3/uL 217  278  214    Lab Results  Component Value Date   MCV 88 07/13/2022   MCV 88 12/21/2021   MCV 84.6 08/23/2020   Lab Results  Component Value Date   TSH 1.560 07/13/2022   Lab Results  Component Value Date   HGBA1C 5.2 07/13/2022     BNP No results found for: "BNP"  ProBNP No results found for: "PROBNP"   Lipid Panel     Component Value Date/Time   CHOL 204 (H) 07/13/2022 0807   TRIG 114 07/13/2022 0807   HDL 47 07/13/2022 0807   CHOLHDL 4.3 07/13/2022 0807   CHOLHDL 3.2 11/03/2014 0800   VLDL 19 11/03/2014 0800   LDLCALC 136 (H) 07/13/2022 0807   LABVLDL 21 07/13/2022 0807     RADIOLOGY: No results found.   Additional studies/ records that were reviewed today include:   I was able to obtain his initial sleep study from the [facility] dated August 09, 2011   ASSESSMENT:    1. Obstructive sleep apnea syndrome on BiPAP   2. Essential hypertension   3. Pure hypercholesterolemia   4. 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  He has a history of hypertension, hyperlipidemia, and hypothyroidism.  He has been followed by Dr.  Allyson Sabal for cardiology care.  He is status post multiple orthopedic surgeries of both knees and hips and remotely had suffered vocal cord carcinoma.  His initial sleep study in 2012 showed moderate overall sleep apnea however events were severe during REM sleep with an AHI of 60.6/h and even more severe with supine sleep with an AHI of 82.7/h.  He had an initial BiPAP device and apparently on December 09, 2016 he received a ResMed air curve 10 via auto BiPAP unit.  His unit is now 16-1/62 years old and he recently has had issues where his machine in the middle of the night.  He now uses [employer] as his DME company.  He typically goes to bed around 10:30 PM and wakes up at 4:45 PM.  He cannot sleep without his BiPAP unit.  I had a discussion with him today concerning the benefit of sleep apnea particularly with reference to untreated sleep apnea contributing to adverse cardiovascular consequences including its effect on blood pressure control, nocturnal arrhythmias, risk of atrial fibrillation, effects on insulin resistance, GERD, and potential nocturnal hypoxemia contributing to ischemia.  I discussed with him that ResMed will be releasing their new ResMed AirCurve 11 VAuto unit and we will request that he obtain the new device once available.  His most recent download shows that he has been set at a fixed pressure of 19/15.  With his new machine, I will change him to an auto mode and keep EPAP minimum at 15, pressure support of 4 but if necessary IPAP max will be 25 if he requires additional pressure support.  He has moderate obesity with a BMI of 38.27.  He does participate in water aerobics 5 days/week for aquatic physical therapy.  Weight loss is recommended.  I discussed optimal sleep duration at 7 and 9 hours if at all possible and I suspect his Epworth scale score of 11 which is consistent with mild residual daytime sleepiness is contributed by his suboptimal sleep  duration and only 6 hours and 31 minutes per night.   Once he receives his new BiPAP machine, I will need to see him within 90 days for follow-up evaluation and compliance.  His blood pressure today is stable on his current antihypertensive medication.  He is scheduled to have a follow-up appointment with Dr. Allyson Sabal.   Medication Adjustments/Labs and Tests Ordered: Current medicines are reviewed at length with the patient today.  Concerns regarding medicines are outlined above.  Medication changes, Labs and Tests ordered today are listed in the Patient Instructions below. Patient Instructions  Medication Instructions:   No changes  *If you need a refill on your cardiac medications before your next appointment, please call your pharmacy*   Lab Work: Not needed .   Testing/Procedures: Not needed   Follow-Up: At [facility], you and your health needs are our priority.  As part of our continuing mission to provide you with exceptional heart care, we have created designated Provider Care Teams.  These Care Teams include your primary Cardiologist (physician) and Advanced Practice Providers (APPs -  Physician Assistants and Nurse Practitioners) who all work together to provide you with the care you need, when you need it.     Your next appointment:   4 to 5 month(s)  The format for your next appointment:   In Person  Provider:   Dr Nicki Guadalajara   Other Instructions   Will need to order your new machine through the [facility]. AirCurve 11  Settings E-PAP 15 Pressure 4  I PAP 25     Signed, Nicki Guadalajara, MD, [facility], ABSM Diplomate, American Board of Sleep Medicine  02/06/2023 6:07 PM    [facility] [address], [place], [place]  [address] Phone: [phone]

## 2023-02-05 ENCOUNTER — Telehealth: Payer: Self-pay | Admitting: Cardiovascular Disease

## 2023-02-05 NOTE — Telephone Encounter (Signed)
Spoke with sheree, last office note from dr Claiborne Billings faxed to [phone] so patient can continue to get appointments approved.

## 2023-02-05 NOTE — Telephone Encounter (Signed)
Calling to get RFAS from our office for the patient. Fax [phone]. Please advise

## 2023-02-06 ENCOUNTER — Encounter: Payer: Self-pay | Admitting: Cardiovascular Disease

## 2023-02-07 ENCOUNTER — Ambulatory Visit: Payer: No Typology Code available for payment source | Admitting: Cardiovascular Disease

## 2023-02-14 ENCOUNTER — Encounter: Payer: No Typology Code available for payment source | Attending: Internal Medicine | Admitting: Dietician

## 2023-02-14 ENCOUNTER — Encounter: Payer: Self-pay | Admitting: Dietician

## 2023-02-14 DIAGNOSIS — Z713 Dietary counseling and surveillance: Secondary | ICD-10-CM | POA: Diagnosis not present

## 2023-02-14 DIAGNOSIS — E668 Other obesity: Secondary | ICD-10-CM | POA: Insufficient documentation

## 2023-02-14 NOTE — Progress Notes (Signed)
Medical Nutrition Therapy  Appointment Start time:  [phone]  Appointment End time:  0945  Primary concerns today: Pt wants to learn what/when he should be eating for his health.    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   Pt states he exercises a lot. He goes to the pool 5x/wk and swims freestyle, back stroke, breast stroke, etc. Pt has had a lot of injuries/surgeries so swimming is his preferred exercise.   Pt states he wants to lose weight to help with his joints.   Pt reports he used to pack his lunch for work but stopped and has been eating out recently. Pt states he stopped eating bread and sweets.  Pt reports he is very regimented and likes routine.   Pt commonly snacks on unsalted saltines between meals.   Estimated daily fluid intake: 72-84 oz Supplements: MVI, b-complex, vitamin D, vitamin C Sleep: 6 hours, 10:30pm-4:30am.  Stress / self-care: mild-to-moderate stress Current average weekly physical activity: 4-60 min 5x/wk.   24-Hr Dietary Recall First Meal: 6am: belvita packet Snack: none Second Meal: fried fish from arbys (only fish) OR chickfila cobb salad Snack: 2pm: boost or premier protein shake with 8 unssalted saltines and banana Third Meal: 5:30pm: hello fresh meal 2x/wk (protein, starch, veggies) OR out: [place] wrap and salad with [place] Snack: 9pm: 10 unsalted saltines and glass of 1% milk Beverages: 3 cups coffee between 5:30-9:30am with stevia, 1 sugar free gatorade, 72-84 oz water, 4  oz 1% milk, 1 kombucha daily   NUTRITION DIAGNOSIS  NB-1.1 Food and nutrition-related knowledge deficit As related to lack of prior nutrition education by a nutrition professional.  As evidenced by pt report.   NUTRITION INTERVENTION  Nutrition education (E-1) on the following topics:  Antioxidant rich food choices Consistent meal times Building balanced meals and snacks Whole vs refined grains Nutrient label reading for added sugars MyPlate Importance of protein at meals and snacks Importance of stress management for health Saturated fat reduction and cholesterol  Handouts Provided Include  Dish Up a Healthy Meal Meal Ideas  Learning Style & Readiness for Change Teaching method utilized: Visual & Auditory  Demonstrated degree of understanding via: Teach Back  Barriers to learning/adherence to lifestyle change: none  Goals Established by Pt At meals aim to include a protein, complex carb, and vegetables. At snacks aim to include a protein and carb.   Goal: Include a protein with your breakfast: peanut butter on your belvita or a protein shake. Whole grain toast with peanut butter.   Goal: aim to make 1/2 of your plate vegetables 2x/day.    MONITORING & EVALUATION Dietary intake, weekly physical activity, and follow up in 2 months.  Next Steps  Patient is to call for questions.

## 2023-02-14 NOTE — Patient Instructions (Addendum)
Aim for 150 minutes of physical activity weekly. Make physical activity a part of your week. Try to include at least 30 minutes of physical activity 5 days each week or at least 150 minutes per week. Regular physical activity promotes overall health-including helping to reduce risk for heart disease and diabetes, promoting mental health, and helping [place] sleep better.     At meals aim to include a protein, complex carb, and vegetables. At snacks aim to include a protein and carb.   Goal: Include a protein with your breakfast: peanut butter on your belvita or a protein shake. Whole grain toast with peanut butter.   Goal: aim to make 1/2 of your plate vegetables 2x/day.

## 2023-02-21 ENCOUNTER — Other Ambulatory Visit (HOSPITAL_BASED_OUTPATIENT_CLINIC_OR_DEPARTMENT_OTHER): Payer: Self-pay

## 2023-02-21 MED ORDER — PRAZIQUANTEL 600 MG PO TABS
ORAL_TABLET | ORAL | 0 refills | Status: DC
  Filled 2023-02-21: qty 6, 2d supply, fill #0

## 2023-02-27 ENCOUNTER — Encounter (HOSPITAL_BASED_OUTPATIENT_CLINIC_OR_DEPARTMENT_OTHER): Payer: Self-pay | Admitting: Family Medicine

## 2023-02-27 ENCOUNTER — Ambulatory Visit (HOSPITAL_BASED_OUTPATIENT_CLINIC_OR_DEPARTMENT_OTHER): Payer: No Typology Code available for payment source | Admitting: Family Medicine

## 2023-02-27 ENCOUNTER — Other Ambulatory Visit (HOSPITAL_BASED_OUTPATIENT_CLINIC_OR_DEPARTMENT_OTHER): Payer: Self-pay

## 2023-02-27 VITALS — BP 134/85 | HR 61 | Ht 72.0 in | Wt 277.0 lb

## 2023-02-27 DIAGNOSIS — H65191 Other acute nonsuppurative otitis media, right ear: Secondary | ICD-10-CM | POA: Diagnosis not present

## 2023-02-27 MED ORDER — AMOXICILLIN-POT CLAVULANATE 875-125 MG PO TABS
1.0000 | ORAL_TABLET | Freq: Two times a day (BID) | ORAL | 0 refills | Status: DC
  Filled 2023-02-27: qty 20, 10d supply, fill #0

## 2023-02-27 NOTE — Progress Notes (Signed)
   Acute Office Visit  Subjective:     Patient ID: Samuel Goodwin, male    DOB: [birth year], 62 y.o.   MRN: [id number]  Chief Complaint  Patient presents with   Ear Pain    Right ear, ongoing for about a week   62 yo male patient presents with right ear pain. He reports that he swims for exercise about 4-5 times per week. He blew his right TM in the [employer] so he always "walks around with a dull ache" and tinnitus, but on Friday he noticed that the cold air was painful and irritating to his ear. He thinks he got water in his ear since he did not use his swimmer's ears drop for prevention. He reports trying peroxide in his R ear. States that there was tenderness present near his mastoid bone and tragus. Denies issues with his equilibrium, ear discharge,   He reports that the [place] does not have an ENT specialist and would like for me to place referral to ENT in [place]. States he will do some research and let us know where he wants the referral placed. He used to see Dr. [name] at [facility] for his throat but does not want to drive out there.   Review of Systems  HENT:  Positive for ear pain and tinnitus (chronic). Negative for ear discharge and sore throat.   Respiratory:  Negative for cough.   Cardiovascular:  Negative for chest pain.  Neurological:  Negative for headaches.      Objective:    BP 134/85   Pulse 61   Ht 6' (1.829 m)   Wt 277 lb (125.6 kg)   SpO2 99%   BMI 37.57 kg/m   Physical Exam HENT:     Right Ear: Tenderness present. No drainage. A middle ear effusion is present. There is mastoid tenderness. Tympanic membrane is erythematous and bulging.     Left Ear: No drainage or swelling. No mastoid tenderness. Tympanic membrane is not erythematous or bulging.  Cardiovascular:     Rate and Rhythm: Normal rate and regular rhythm.     Heart sounds: Normal heart sounds.  Pulmonary:     Effort: Pulmonary effort is normal.     Breath sounds: Normal breath sounds.     Assessment & Plan:  Other non-recurrent acute nonsuppurative otitis media of right ear -     Amoxicillin-Pot Clavulanate; Take 1 tablet by mouth 2 (two) times daily.  Dispense: 20 tablet; Refill: 0  Patient presents today with acute non-suppurative otitis media of the right ear. Advised patient to avoid swimming this week and reports he is planning to walk instead. Educated patient that it may take 48-72 hours before symptoms improve and to complete entire course of Augmentin. Advised patient that if his ear pain does not improve by Friday of this week to reach back out to the office.   Return if symptoms worsen or fail to improve.  Les Pou, FNP

## 2023-02-28 ENCOUNTER — Encounter (HOSPITAL_BASED_OUTPATIENT_CLINIC_OR_DEPARTMENT_OTHER): Payer: Self-pay | Admitting: Family Medicine

## 2023-03-09 ENCOUNTER — Ambulatory Visit: Payer: No Typology Code available for payment source | Admitting: Dietician

## 2023-03-29 ENCOUNTER — Ambulatory Visit: Payer: No Typology Code available for payment source | Admitting: Dietician

## 2023-04-05 ENCOUNTER — Ambulatory Visit (INDEPENDENT_AMBULATORY_CARE_PROVIDER_SITE_OTHER): Payer: No Typology Code available for payment source | Admitting: Family Medicine

## 2023-04-05 ENCOUNTER — Encounter (INDEPENDENT_AMBULATORY_CARE_PROVIDER_SITE_OTHER): Payer: Self-pay | Admitting: Family Medicine

## 2023-04-05 VITALS — BP 133/72 | HR 74 | Temp 97.9°F | Ht 71.0 in | Wt 268.0 lb

## 2023-04-05 DIAGNOSIS — I10 Essential (primary) hypertension: Secondary | ICD-10-CM | POA: Diagnosis not present

## 2023-04-05 DIAGNOSIS — Z6837 Body mass index (BMI) 37.0-37.9, adult: Secondary | ICD-10-CM

## 2023-04-05 DIAGNOSIS — Z0289 Encounter for other administrative examinations: Secondary | ICD-10-CM

## 2023-04-05 DIAGNOSIS — E039 Hypothyroidism, unspecified: Secondary | ICD-10-CM

## 2023-04-05 DIAGNOSIS — G4733 Obstructive sleep apnea (adult) (pediatric): Secondary | ICD-10-CM

## 2023-04-05 DIAGNOSIS — E782 Mixed hyperlipidemia: Secondary | ICD-10-CM | POA: Diagnosis not present

## 2023-04-05 DIAGNOSIS — E669 Obesity, unspecified: Secondary | ICD-10-CM

## 2023-04-05 NOTE — Progress Notes (Addendum)
Carlye Grippe, DO, ABFM, ABOM Department of Obesity Medicine  [facility] [address] [place], [place], [place] [address] Office: [phone]  /  Fax: [phone]   Initial Visit  Samuel Goodwin was seen in clinic today to evaluate for obesity. He is interested in losing weight to improve overall health and reduce the risk of weight related complications. He presents today to review program treatment options, initial physical assessment, and evaluation.     He was referred by: Dr.Berry, his cardiologist.   When asked what else they would they like to accomplish? He states: Improve existing medical conditions, Reduce risk for a surgery, and Improve quality of life  When asked how has your weight affected you? He states: Contributed to medical problems and Contributed to orthopedic problems or mobility issues. Patient wears knee braces for support and has had multiple surgeries on his large joints and back.   Some associated conditions: Hypertension, Hyperlipidemia, OSA, and GERD. *He is not on any medication for Hyperlipidemia.    Contributing factors: Disruption of circadian rhythm, Reduced physical activity, and Eating patterns  Weight promoting medications identified: None identified   Current nutrition plan: Eating less/Portion Control, decreasing sugar, decreasing carbs, no snacking.   Current level of physical activity: He swims 5 days a week.   Current or previous pharmacotherapy: None  Response to medication: Never tried medications   Past medical history includes:   Past Medical History:  Diagnosis Date   Alcohol abuse    Sobriety since 1990   Anxiety    Arthritis    oa   Benign positional vertigo    Depression    Eardrum trauma    30 years ago/right ear   GERD (gastroesophageal reflux disease)    H/O total hip arthroplasty 02/05/2016   Headache    sinus   Hepatitis C 1994   hep c - ? etiology; in [place] 1989-91; S/P  interferon , Dr Kinnie Scales   Hip fracture 11/13/2018   Hip instability 02/05/2016   History of bronchitis    History of iron deficiency anemia    Hoarseness    HOARSENESS, CHRONIC 05/31/2009   Qualifier: Diagnosis of   By: Leone Payor MD, Charlyne Quale     S/P laser surgery X 3 , Dr Delford Field , [facility]      Hyperlipidemia    Hypertension    Hypothyroidism    Morbid obesity 12/20/2016   Morbid obesity   NONSPECIFIC ABNORMAL ELECTROCARDIOGRAM 01/06/2010   Qualifier: Diagnosis of   By: Alwyn Ren MD, Chrissie Noa       Obesity    OSA (obstructive sleep apnea)    on CPAP   PONV (postoperative nausea and vomiting)    following knee surgery last year    PTSD (post-traumatic stress disorder)    Reactive airway disease 11/06/2018   S/P knee replacement 11/27/2014   S/P right TH revision 02/09/2016   S/P right THA, AA 02/01/2016   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  Objective:   BP 133/72   Pulse 74   Temp 97.9 F (36.6  C)   Ht  (1.803 m)   Wt 268 lb (121.6 kg)   SpO2 96%   BMI 37.38 kg/m  He was weighed on the bioimpedance scale: Body mass index is 37.38 kg/m.  Visceral Fat 23%, Body Fat 38%   General: Well Developed, well nourished, and in no acute distress.  HEENT: Normocephalic, atraumatic Skin: Warm and dry, cap RF less 2 sec, good turgor Chest:  Normal excursion, shape, no gross abn Respiratory: speaking in full sentences, no conversational dyspnea NeuroM-Sk: Ambulates w/o assistance, moves * 4 Psych: A and O *3, insight good, mood-full   Assessment and  Plan:   Orders Placed This Encounter  Procedures   T3   T4, free   TSH   Comprehensive metabolic panel   Lipid panel   EKG 12-Lead    There are no discontinued medications.   No orders of the defined types were placed in this encounter.    Primary hypertension Assessment: Condition is stable. Last 3 blood pressure readings in our office are as follows: BP Readings from Last 3 Encounters:  04/05/23 133/72  02/27/23 134/85  02/02/23 118/78  - He reports good compliance and tolerance with Cozaar 100 mg daily and Hydrodiuril 25 mg daily. Denies any side effects. - Today he is asymptomatic. No concerns.   Plan: - Check ECG - Continue with meds as recommended by pcp/specialist.  -  Patient will work on avoiding buying foods that are: processed, frozen, or prepackaged to avoid excess salt. - Discussed with patient that weight loss will help to keep his blood pressure within the normal range. - We will continue to monitor symptoms as they relate to the his weight loss journey.    Mixed hyperlipidemia Assessment: Condition is not optimized. Lab Results  Component Value Date   CHOL 204 (H) 07/13/2022   HDL 47 07/13/2022   LDLCALC 136 (H) 07/13/2022   TRIG 114 07/13/2022   CHOLHDL 4.3 07/13/2022  - Patient is currently not on any medication for Hyperlipidemia. He endorses that he does not want to be on any medication for this condition.  - This is diet controlled.   Plan:  - Check CMP and FLP - We discussed that healthy eating/weight loss will help to decrease his cholesterol levels in the future.  Demont Linford will begin working on eating foods that are low in saturated and trans fats, and low in fatty carbs to improve these numbers.    Obstructive sleep apnea syndrome Assessment: Condition is stable. Patient was diagnosed with obstructive sleep apnea in 2007 and endorses that he is on nightly Bipap.   Plan: - Continue with nightly Bipap use and treatment of sleep apnea as  recommended by  [facility] of cardiology.  - Goal: Treatment of OSA via CPAP compliance and weight loss. Plasma ghrelin levels (appetite or "hunger hormone") are significantly higher in OSA patients than in BMI-matched controls, but decrease to levels similar to those of obese patients without OSA after CPAP treatment.  Weight loss improves OSA by several mechanisms, including reduction in fatty tissue in the throat (i.e. parapharyngeal fat) and the tongue. Loss of abdominal fat increases mediastinal traction on the upper airway making it less likely to collapse during sleep. Studies have also shown that compliance with CPAP treatment improves leptin imbalance.    Hypothyroidism, unspecified type Assessment: Condition is stable. Lab Results  Component Value Date   TSH 1.560 07/13/2022   FREET4 1.02 11/12/2018  - He is compliant with Synthroid  125 mcg daily. Denies any side effects.  Plan: - Check TSH, Free TSH, and T3 - Continue with med and treatment as recommended by specialist  - We will ensure his thyroid levels are optimized in order to enhance his weight loss journey .   Obesity (BMI 30-39.9) Assessment: Condition is Not optimized. Fat mass is 102 lbs.  Muscle mass is 158.4 lbs. Total body water is 119.2 lbs.   Plan:  - He will work on gathering support from family and friends to begin weight loss journey. - Continue eliminating or reducing the presence of highly processed, calorie dense foods in the home. - Complete provided nutritional and psychosocial assessment questionnaire prior to his next visit.   - Assess for cardiometabolic complications and nutritional deficiencies via fasting serologies. - Assess REE via indirect calorimetry to guide the creation of a reduced calorie, high protein meal plan to promote loss of fat mass.    Newman Nickels will follow up in the next 1-2 weeks to review the above steps and continue evaluation and treatment of their disease of  obesity  Obesity Education Performed Today: He was weighed on the bioimpedance scale and results were discussed and documented above  We discussed obesity as a disease and the importance of a more detailed evaluation of all the factors contributing to the disease.  We discussed the importance of long term lifestyle changes which include nutrition, exercise and behavioral modifications as well as the importance of customizing this to his specific health and social needs.  We discussed the benefits of reaching a healthier weight to alleviate the symptoms of existing conditions and reduce the risks of the biomechanical, metabolic and psychological effects of obesity.  Spyros Winch appears to be in the action stage of change and states they are ready to start intensive lifestyle modifications and behavioral modifications.  45 minutes was spent today on this visit including the above counseling, pre-visit chart review, and post-visit documentation.  Attestations:   Reviewed by clinician on day of visit: allergies, medications, problem list, medical history, surgical history, family history, social history, and previous encounter notes.   I,Special Puri,acting as a Neurosurgeon for [employer], DO.,have documented all relevant documentation on the behalf of Thomasene Lot, DO,as directed by  Thomasene Lot, DO while in the presence of Thomasene Lot, DO.   I, Thomasene Lot, DO, have reviewed all documentation for this visit. The documentation on 04/05/23 for the exam, diagnosis, procedures, and orders are all accurate and complete.

## 2023-04-20 ENCOUNTER — Ambulatory Visit: Payer: No Typology Code available for payment source | Admitting: Dietician

## 2023-04-20 ENCOUNTER — Telehealth: Payer: Self-pay | Admitting: Cardiovascular Disease

## 2023-04-20 NOTE — Telephone Encounter (Signed)
Pt calling for an update on if his sleep supplies was sent to the [place] yet.

## 2023-04-25 ENCOUNTER — Ambulatory Visit (INDEPENDENT_AMBULATORY_CARE_PROVIDER_SITE_OTHER): Payer: No Typology Code available for payment source | Admitting: Family Medicine

## 2023-04-25 ENCOUNTER — Encounter (INDEPENDENT_AMBULATORY_CARE_PROVIDER_SITE_OTHER): Payer: Self-pay | Admitting: Family Medicine

## 2023-04-25 VITALS — BP 142/75 | HR 66 | Temp 97.5°F | Ht 71.0 in | Wt 267.0 lb

## 2023-04-25 DIAGNOSIS — Z1331 Encounter for screening for depression: Secondary | ICD-10-CM

## 2023-04-25 DIAGNOSIS — R5383 Other fatigue: Secondary | ICD-10-CM

## 2023-04-25 DIAGNOSIS — G4733 Obstructive sleep apnea (adult) (pediatric): Secondary | ICD-10-CM | POA: Diagnosis not present

## 2023-04-25 DIAGNOSIS — E782 Mixed hyperlipidemia: Secondary | ICD-10-CM

## 2023-04-25 DIAGNOSIS — R0602 Shortness of breath: Secondary | ICD-10-CM | POA: Diagnosis not present

## 2023-04-25 DIAGNOSIS — E669 Obesity, unspecified: Secondary | ICD-10-CM

## 2023-04-25 DIAGNOSIS — K760 Fatty (change of) liver, not elsewhere classified: Secondary | ICD-10-CM

## 2023-04-25 DIAGNOSIS — F39 Unspecified mood [affective] disorder: Secondary | ICD-10-CM

## 2023-04-25 DIAGNOSIS — E039 Hypothyroidism, unspecified: Secondary | ICD-10-CM | POA: Diagnosis not present

## 2023-04-25 DIAGNOSIS — I10 Essential (primary) hypertension: Secondary | ICD-10-CM

## 2023-04-25 NOTE — Progress Notes (Signed)
Carlye Grippe, D.O.  ABFM, ABOM Specializing in Clinical Bariatric Medicine Office located at: [address] [place], [place]  [address]     Initial Bariatric Medicine Consultation Visit  Dear de [place], Buren Kos, MD   Thank you for referring Samuel Goodwin to our clinic today for evaluation.  We performed a consultation to discuss his options for treatment and educate the patient on his disease state.  The following note includes my evaluation and treatment recommendations.   Please do not hesitate to reach out to me directly if you have any further concerns.    Assessment and Plan:   Orders Placed This Encounter  Procedures   Lipid Panel With LDL/HDL Ratio   VITAMIN D 25 Hydroxy (Vit-D Deficiency, Fractures)   Vitamin B12   T4, free   T3   TSH   Insulin, random   Hemoglobin A1c   Comprehensive metabolic panel   CBC with Differential/Platelet   Folate    Medications Discontinued During This Encounter  Medication Reason   amoxicillin (AMOXIL) 500 MG capsule    amoxicillin-clavulanate (AUGMENTIN) 875-125 MG tablet Completed Course   pantoprazole (PROTONIX) 40 MG tablet Change in therapy     No orders of the defined types were placed in this encounter.    1) Fatigue Assessment: Condition is Not optimized. Birdie does feel that his weight is causing his energy to be lower than it should be. Fatigue may be related to obesity, depression or many other causes. he does not appear to have any red flag symptoms and this appears to most likely related to his current lifestyle habits and dietary intake.  Plan:  Labs will be ordered and reviewed with him at their next office visit in two weeks. Epworth sleepiness scale score appears to be within normal limits.  His ESS score is 11.   Matin admits to daytime somnolence and denies waking up still tired.  Slader generally gets  6.5  hours of sleep per night, and states that he has generally restful sleep. Snoring is  present. Apneic episodes is present.  ECG: Prior ECG on Feb 03, 2023 done with [facility] showed normal sinus rhythm, rate 72 bpm; reassuring without any acute abnormalities, will continue to monitor for symptoms  Modified PHQ-9 Depression Screen: His Food and Mood (modified PHQ-9) score was 9. In the meanwhile, Wren will focus on self care including making healthy food choices by following their meal plan, improving sleep quality and focusing on stress reduction.  Once we are assured he is on an appropriate meal plan, we will start discussing exercise to increase cardiovascular fitness levels.    2) Shortness of breath on exertion Assessment: Condition is Not optimized. Nicoles does feel that he gets out of breath more easily than he used to when he exercises and seems to be worsening over time with weight gain.  This has gotten worse recently. [place] denies shortness of breath at rest or orthopnea. Ritter's shortness of breath appears to be obesity related and exercise induced, as they do not appear to have any "red flag" symptoms/ concerns today.  Also, this condition appears to be related to a state of poor cardiovascular conditioning   Plan:  Obtain labs today and will be reviewed with him at their next office visit in two weeks. Indirect Calorimeter completed today to help guide our dietary regimen. It shows a VO2 of 292 and a REE of 2016.  His calculated basal metabolic rate is 6045 thus  his measured basal metabolic rate is worse than expected. Patient agreed to work on weight loss at this time.  As Deiontay progresses through our weight loss program, we will gradually increase exercise as tolerated to treat his current condition.   If Barnie follows our recommendations and loses 5-10% of their weight without improvement of his shortness of breath or if at any time, symptoms become more concerning, they agree to urgently follow up with their PCP/ specialist for further consideration/ evaluation.   Saint  verbalizes agreement with this plan.    Depression screening   Primary hypertension Assessment: Condition is not optimized in office today. Last 3 blood pressure readings in our office are as follows: BP Readings from Last 3 Encounters:  04/25/23 (!) 142/75  04/05/23 133/72  02/27/23 134/85  - Pt endorses that his high blood pressure reading today is likely due to some recent stress from work this morning. - He is asymptomatic, no concerns.  - No issues with Cozaar 100 mg daily and Hydrodiuril 25 mg daily. Denies any side effects.   Plan: - Check labs.  - Continue with all hypertensive medications as recommended by pcp/specialist.  - Begin Prudent nutritional plan and low sodium diet, advance exercise as tolerated.   - We will continue to monitor symptoms as they relate to the his weight loss journey.    Mixed hyperlipidemia Assessment: Condition is not optimized. Lab Results  Component Value Date   CHOL 204 (H) 07/13/2022   HDL 47 07/13/2022   LDLCALC 136 (H) 07/13/2022   TRIG 114 07/13/2022   CHOLHDL 4.3 07/13/2022  - Patient is currently not on any medication for Hyperlipidemia. He endorses that he has not been on any Cholesterol Medication for ~ 2 years.  - This condition is currently  diet controlled.   Plan:  - Check labs.  Newman Nickels agrees to begin our treatment plan of a heart-heathy, low cholesterol meal plan - We will continue routine screening as patient continues to achieve health goals along their weight loss journey     Obstructive sleep apnea syndrome- on bipap Assessment: Condition is stable.  - Patient was diagnosed with obstructive sleep apnea in 2007 and endorses that he is on nightly Bipap.   Plan: - Continue with nightly Bipap use and treatment of sleep apnea as recommended by  [facility] of cardiology.  - Goal: Treatment of OSA via CPAP compliance and weight loss. Plasma ghrelin levels (appetite or "hunger hormone") are significantly  higher in OSA patients than in BMI-matched controls, but decrease to levels similar to those of obese patients without OSA after CPAP treatment.  Weight loss improves OSA by several mechanisms, including reduction in fatty tissue in the throat (i.e. parapharyngeal fat) and the tongue. Loss of abdominal fat increases mediastinal traction on the upper airway making it less likely to collapse during sleep. Studies have also shown that compliance with CPAP treatment improves leptin imbalance.    Hypothyroidism, unspecified type Assessment: Condition is stable. - He reports good compliance and tolerance with Synthroid 125 mcg daily. Denies any side effects.   Plan:  - Check labs.  - Continue with med/treatment as instructed by specialist -  We will ensure his thyroid levels are optimized in order to enhance his weight loss journey .    Mood disorder (HCC) - Emotional Eating Assessment: Condition is not optimized.  - Denies any SI/HI. Mood is stable. - Pt endorses eating when stressed, bored, and to help comfort himself.  Plan:  - Patient was referred to Dr. Dewaine Conger, our Bariatric Psychologist, for evaluation due his struggles with emotional eating.  - Begin his prudent nutritional plan that is low in simple carbohydrates, saturated fats and trans fats to goal of 5-10% weight loss to achieve significant health benefits.  - We will continue to monitor closely alongside PCP / other specialists.    NAFLD (nonalcoholic fatty liver disease) Assessment: Condition is stable. - Pt reports history of being told he has fatty liver. - Pt's visceral fat rating is 23 today.  Plan: - Educated pt on visceral fat rating and how we recommend weight loss to improve this condition and hence decrease risk of cardiovascular event. - NAFLD is the 2nd leading cause of liver transplant in adults. Treatment includes weight loss, elimination of sweet drinks, including juice, avoidance of high fructose corn syrup,  and exercise. As always, avoiding alcohol consumption is important. - Treatment goal: 7-10% reduction of body weight.   - Aerobic activity and resistance training are important to help achieve a healthy body weight BUT also increases peripheral insulin sensitivity, reducing delivery of free fatty acids and glucose to the liver.    TREATMENT PLAN FOR OBESITY: Obesity (BMI 30-39.9) Assessment: Condition is not optimized.  Fat mass has decreased by 0.2 lb. Muscle mass has decreased by 1.4 lb. Total body water has decreased by 3.8 lb.   Plan:  Tamarius is currently in the action stage of change. As such, his goal is to continue weight management plan. Chrishawn will work on healthier eating habits and begin the Category 2 meal plan with 6 ounces of lean protein at lunch and 8-10 ounces of lean protein at dinner.  - I extensively reviewed the meal plan with the pt and all his questions were answered appropriately.   Behavioral Intervention Additional resources provided today: category 2 meal plan information and Category 2 grocery list  Evidence-based interventions for health behavior change were utilized today including the discussion of self monitoring techniques, problem-solving barriers and SMART goal setting techniques.   Regarding patient's less desirable eating habits and patterns, we employed the technique of small changes.  Pt will specifically work on: begin prudent nutritional plan for next visit.    Recommended Physical Activity Goals Shonta has been advised to gradually work up to 150 minutes of moderate intensity aerobic activity a week and strengthening exercises 2-3 times per week for cardiovascular health, weight loss maintenance and preservation of muscle mass.  He has agreed to continue their current level of activity  FOLLOW UP: Follow up in 2 weeks. He was informed of the importance of frequent follow up visits to maximize his success with intensive lifestyle modifications for his  multiple health conditions.  Sylvain Broccoli is aware that we will review all of his lab results at our next visit.  He is aware that if anything is critical/ life threatening with the results, we will be contacting him via MyChart prior to the office visit to discuss management.    Chief Complaint:   OBESITY Rupinder Therrell (MR# [id number]) is a 62 y.o. male who presents for evaluation and treatment of obesity and related comorbidities. Current BMI is Body mass index is 37.24 kg/m. Jaivian has been struggling with his weight for many years and has been unsuccessful in either losing weight, maintaining weight loss, or reaching his healthy weight goal.  Jovaun Fretwell is currently in the action stage of change and ready to dedicate time achieving and maintaining  a healthier weight. Saturnino is interested in becoming our patient and working on intensive lifestyle modifications including (but not limited to) diet and exercise for weight loss.  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  Patient is married to Leighland Misfeldt  and has 3 children.  He lives with his wife [name].  Yvette Rack Hemp's habits were reviewed today and are as follows:   - Swims, walks, and does elliptical 30-45 minutes, 7 days a week.  - Reasons for gaining weight: overeating, family life stressors, traveling for work, stress and pain in his body.   - Never has tried diet plan in past.  - Craves: pasta, sandwiches, and ice-cream  - Snacks on: Peanut butter crackers and 2% milk  - Has protein drinks and banana fruit smoothies  - Worst Food habit: preparing and picking the wrong foods.   - Usually skips lunch.    Subjective:   This is the patient's first visit at [facility].  The patient's NEW PATIENT PACKET that they filled out prior to today's office visit was reviewed at length and information from that paperwork was included within the following  office visit note.    Included in the packet: current and past health history, medications, allergies, ROS, gynecologic history (women only), surgical history, family history, social history, weight history, weight loss surgery history (for those that have had weight loss surgery), nutritional evaluation, mood and food questionnaire along with a depression screening (PHQ9) on all patients, an Epworth questionnaire, sleep habits questionnaire, patient life and health improvement goals questionnaire. These will all be scanned into the patient's chart under the "media" tab.   Review of Systems: Please refer to new patient packet scanned into media. Pertinent positives were addressed with patient today.  Objective:   PHYSICAL EXAM: Blood pressure (!) 142/75, pulse 66, temperature (!) 97.5 F (36.4 C), height 5\' 11"  (1.803 m), weight 267 lb (121.1 kg), SpO2 98 %. Body mass index is 37.24 kg/m. General: Well Developed, well nourished, and in no acute distress.  HEENT: Normocephalic, atraumatic Skin: Warm and dry, cap RF less 2 sec, good turgor Chest:  Normal excursion, shape, no gross abn Respiratory: speaking in full sentences, no conversational dyspnea NeuroM-Sk: Ambulates w/o assistance, moves * 4 Psych: A and O *3, insight good, mood-full  Anthropometric Measurements Height: 5\' 11"  (1.803 m) Weight: 267 lb (121.1 kg) BMI (Calculated): 37.26 Weight at Last Visit: N/A Weight Lost Since Last Visit: N/A Weight Gained Since Last Visit: N/A Starting Weight: 267 lb Peak Weight: 315 lb Waist Measurement : 49 inches   Body Composition  Body Fat %: 38.1 % Fat Mass (lbs): 101.8 lbs Muscle Mass (lbs): 157 lbs Total Body Water (lbs): 115.4 lbs Visceral Fat Rating : 23   Other Clinical Data RMR: 2016 Fasting: Yes Labs: Yes Today's Visit #: #1 Starting Date: 04/25/23 Comments: FIRST VISIT    DIAGNOSTIC DATA REVIEWED:  BMET    Component Value Date/Time   NA 140 07/13/2022 0807    K 4.6 07/13/2022 0807   CL 101 07/13/2022 0807   CO2 26 07/13/2022 0807   GLUCOSE 95 07/13/2022 0807   GLUCOSE 91 08/23/2020 1723   BUN 11 07/13/2022 0807   CREATININE 0.89 07/13/2022 0807   CALCIUM 8.8 07/13/2022 0807   GFRNONAA >60 08/23/2020 1723   GFRAA >60 08/23/2020 1723   Lab Results  Component Value Date   HGBA1C 5.2 07/13/2022   HGBA1C 5.3 01/03/2010  No results found for: "INSULIN" Lab Results  Component Value Date   TSH 1.560 07/13/2022   CBC    Component Value Date/Time   WBC 5.5 07/13/2022 0807   WBC 5.7 08/23/2020 1723   RBC 4.67 07/13/2022 0807   RBC 4.54 08/23/2020 1723   HGB 13.9 07/13/2022 0807   HCT 41.1 07/13/2022 0807   PLT 217 07/13/2022 0807   MCV 88 07/13/2022 0807   MCH 29.8 07/13/2022 0807   MCH 29.3 08/23/2020 1723   MCHC 33.8 07/13/2022 0807   MCHC 34.6 08/23/2020 1723   RDW 13.1 07/13/2022 0807   Iron Studies No results found for: "IRON", "TIBC", "FERRITIN", "IRONPCTSAT" Lipid Panel     Component Value Date/Time   CHOL 204 (H) 07/13/2022 0807   TRIG 114 07/13/2022 0807   HDL 47 07/13/2022 0807   CHOLHDL 4.3 07/13/2022 0807   CHOLHDL 3.2 11/03/2014 0800   VLDL 19 11/03/2014 0800   LDLCALC 136 (H) 07/13/2022 0807   Hepatic Function Panel     Component Value Date/Time   PROT 6.8 07/13/2022 0807   ALBUMIN 4.2 07/13/2022 0807   AST 19 07/13/2022 0807   ALT 21 07/13/2022 0807   ALKPHOS 73 07/13/2022 0807   BILITOT 0.3 07/13/2022 0807   BILIDIR 0.12 01/14/2021 0847   IBILI 0.4 11/03/2014 0800      Component Value Date/Time   TSH 1.560 07/13/2022 0807   Nutritional Lab Results  Component Value Date   VD25OH 37 01/03/2010    Attestation Statements:   Reviewed by clinician on day of visit: allergies, medications, problem list, medical history, surgical history, family history, social history, and previous encounter notes.  During the visit, I independently reviewed the patient's EKG, bioimpedance scale results, and  indirect calorimeter results. I used this information to tailor a meal plan for the patient that will help Theoden Carles to lose weight and will improve his obesity-related conditions going forward.  I performed a medically necessary appropriate examination and/or evaluation. I discussed the assessment and treatment plan with the patient. The patient was provided an opportunity to ask questions and all were answered. The patient agreed with the plan and demonstrated an understanding of the instructions. Labs were ordered today (unless patient declined them) and will be reviewed with the patient at our next visit unless more critical results need to be addressed immediately. Clinical information was updated and documented in the EMR.  Time spent on visit including pre-visit chart review and post-visit care was estimated to be 60  minutes.   I,Special Puri,acting as a Neurosurgeon for [employer], DO.,have documented all relevant documentation on the behalf of Thomasene Lot, DO,as directed by  Thomasene Lot, DO while in the presence of Thomasene Lot, DO.   I, Thomasene Lot, DO, have reviewed all documentation for this visit. The documentation on 04/25/23 for the exam, diagnosis, procedures, and orders are all accurate and complete.

## 2023-04-26 LAB — CBC WITH DIFFERENTIAL/PLATELET
Basophils Absolute: 0 10*3/uL (ref 0.0–0.2)
Basos: 1 %
EOS (ABSOLUTE): 0.2 10*3/uL (ref 0.0–0.4)
Eos: 3 %
Hematocrit: 42.2 % (ref 37.5–51.0)
Hemoglobin: 14.5 g/dL (ref 13.0–17.7)
Immature Grans (Abs): 0 10*3/uL (ref 0.0–0.1)
Immature Granulocytes: 0 %
Lymphocytes Absolute: 0.9 10*3/uL (ref 0.7–3.1)
Lymphs: 18 %
MCH: 30 pg (ref 26.6–33.0)
MCHC: 34.4 g/dL (ref 31.5–35.7)
MCV: 87 fL (ref 79–97)
Monocytes Absolute: 0.4 10*3/uL (ref 0.1–0.9)
Monocytes: 8 %
Neutrophils Absolute: 3.7 10*3/uL (ref 1.4–7.0)
Neutrophils: 70 %
Platelets: 219 10*3/uL (ref 150–450)
RBC: 4.84 x10E6/uL (ref 4.14–5.80)
RDW: 12.2 % (ref 11.6–15.4)
WBC: 5.3 10*3/uL (ref 3.4–10.8)

## 2023-04-26 LAB — COMPREHENSIVE METABOLIC PANEL
ALT: 20 IU/L (ref 0–44)
AST: 14 IU/L (ref 0–40)
Albumin/Globulin Ratio: 2 (ref 1.2–2.2)
Albumin: 4.5 g/dL (ref 3.9–4.9)
Alkaline Phosphatase: 79 IU/L (ref 44–121)
BUN/Creatinine Ratio: 15 (ref 10–24)
BUN: 11 mg/dL (ref 8–27)
Bilirubin Total: 0.5 mg/dL (ref 0.0–1.2)
CO2: 26 mmol/L (ref 20–29)
Calcium: 9.3 mg/dL (ref 8.6–10.2)
Chloride: 101 mmol/L (ref 96–106)
Creatinine, Ser: 0.72 mg/dL — ABNORMAL LOW (ref 0.76–1.27)
Globulin, Total: 2.3 g/dL (ref 1.5–4.5)
Glucose: 95 mg/dL (ref 70–99)
Potassium: 4.1 mmol/L (ref 3.5–5.2)
Sodium: 140 mmol/L (ref 134–144)
Total Protein: 6.8 g/dL (ref 6.0–8.5)
eGFR: 104 mL/min/{1.73_m2} (ref >59)

## 2023-04-26 LAB — LIPID PANEL WITH LDL/HDL RATIO
Cholesterol, Total: 227 mg/dL — ABNORMAL HIGH (ref 100–199)
HDL: 55 mg/dL (ref >39)
LDL Chol Calc (NIH): 155 mg/dL — ABNORMAL HIGH (ref 0–99)
LDL/HDL Ratio: 2.8 ratio (ref 0.0–3.6)
Triglycerides: 97 mg/dL (ref 0–149)
VLDL Cholesterol Cal: 17 mg/dL (ref 5–40)

## 2023-04-26 LAB — VITAMIN B12: Vitamin B-12: 421 pg/mL (ref 232–1245)

## 2023-04-26 LAB — TSH: TSH: 0.124 u[IU]/mL — ABNORMAL LOW (ref 0.450–4.500)

## 2023-04-26 LAB — VITAMIN D 25 HYDROXY (VIT D DEFICIENCY, FRACTURES): Vit D, 25-Hydroxy: 59.1 ng/mL (ref 30.0–100.0)

## 2023-04-26 LAB — INSULIN, RANDOM: INSULIN: 14.5 u[IU]/mL (ref 2.6–24.9)

## 2023-04-26 LAB — HEMOGLOBIN A1C
Est. average glucose Bld gHb Est-mCnc: 97 mg/dL
Hgb A1c MFr Bld: 5 % (ref 4.8–5.6)

## 2023-04-26 LAB — FOLATE: Folate: 15.3 ng/mL (ref >3.0)

## 2023-04-26 LAB — T4, FREE: Free T4: 2.33 ng/dL — ABNORMAL HIGH (ref 0.82–1.77)

## 2023-04-26 LAB — T3: T3, Total: 116 ng/dL (ref 71–180)

## 2023-05-09 ENCOUNTER — Encounter (INDEPENDENT_AMBULATORY_CARE_PROVIDER_SITE_OTHER): Payer: Self-pay | Admitting: Family Medicine

## 2023-05-09 ENCOUNTER — Other Ambulatory Visit (HOSPITAL_BASED_OUTPATIENT_CLINIC_OR_DEPARTMENT_OTHER): Payer: Self-pay

## 2023-05-09 ENCOUNTER — Ambulatory Visit (INDEPENDENT_AMBULATORY_CARE_PROVIDER_SITE_OTHER): Payer: No Typology Code available for payment source | Admitting: Family Medicine

## 2023-05-09 VITALS — BP 122/74 | HR 61 | Temp 97.8°F | Ht 71.0 in | Wt 262.0 lb

## 2023-05-09 DIAGNOSIS — E88819 Insulin resistance, unspecified: Secondary | ICD-10-CM | POA: Diagnosis not present

## 2023-05-09 DIAGNOSIS — E669 Obesity, unspecified: Secondary | ICD-10-CM

## 2023-05-09 DIAGNOSIS — E782 Mixed hyperlipidemia: Secondary | ICD-10-CM | POA: Diagnosis not present

## 2023-05-09 DIAGNOSIS — E039 Hypothyroidism, unspecified: Secondary | ICD-10-CM

## 2023-05-09 DIAGNOSIS — Z6836 Body mass index (BMI) 36.0-36.9, adult: Secondary | ICD-10-CM

## 2023-05-09 DIAGNOSIS — E559 Vitamin D deficiency, unspecified: Secondary | ICD-10-CM | POA: Diagnosis not present

## 2023-05-09 DIAGNOSIS — E538 Deficiency of other specified B group vitamins: Secondary | ICD-10-CM | POA: Diagnosis not present

## 2023-05-09 MED ORDER — LEVOTHYROXINE SODIUM 112 MCG PO TABS
224.0000 ug | ORAL_TABLET | Freq: Every morning | ORAL | 0 refills | Status: DC
Start: 2023-05-09 — End: 2023-05-24
  Filled 2023-05-09: qty 60, 30d supply, fill #0

## 2023-05-09 MED ORDER — CYANOCOBALAMIN 500 MCG PO TABS
ORAL_TABLET | ORAL | Status: DC
Start: 2023-05-09 — End: 2024-02-21

## 2023-05-09 MED ORDER — VITAMIN D (CHOLECALCIFEROL) 25 MCG (1000 UT) PO CAPS
ORAL_CAPSULE | ORAL | Status: DC
Start: 2023-05-09 — End: 2024-02-21

## 2023-05-09 NOTE — Progress Notes (Signed)
Carlye Grippe, D.O.  ABFM, ABOM Specializing in Clinical Bariatric Medicine  Office located at: [address] [place], [place]  [address]     Assessment and Plan:   Medications Discontinued During This Encounter  Medication Reason   levothyroxine (SYNTHROID) 125 MCG tablet Dose change   VITAMIN D, CHOLECALCIFEROL, PO Reorder     Meds ordered this encounter  Medications   Vitamin D, Cholecalciferol, 25 MCG (1000 UT) CAPS    Sig: 5,000 IU  daily   cyanocobalamin (VITAMIN B12) 500 MCG tablet    Sig: 300- 500 mcg daily   levothyroxine (SYNTHROID) 112 MCG tablet    Sig: Take 2 tablets (224 mcg total) by mouth every morning.    Dispense:  60 tablet    Refill:  0     Vitamin B12 deficiency Assessment: Condition is new and not optimized. Labs were reviewed with patient today and education provided on them.  All of the patient's questions about them were answered   Lab Results  Component Value Date   VITAMINB12 421 04/25/2023  - Labs indicate that his B12 levels are not within the recommended normal limits of 500+.  - He is currently not taking any OTC B12 supplement. He was previously taking a B12 supplement and a B12 complex.  Plan:  - Begin OTC B12 500 mcg daily.  - Continue their prudent nutritional plan and focus on b12 rich foods such as lean red meats; poultry; eggs; seafood; beans, peas, and lentils; nuts and seeds; and soy products - We will continue to monitor as deemed clinically necessary.   Insulin resistance  Assessment: Condition is new and not optimized. Labs were reviewed with patient today and education provided on them. We discussed how the foods patient eats may influence these laboratory findings.  All of the patient's questions about them were answered   Lab Results  Component Value Date   HGBA1C 5.0 04/25/2023   HGBA1C 5.2 07/13/2022   HGBA1C 5.1 12/21/2021   INSULIN 14.5 04/25/2023   Lab Results  Component Value Date   VD25OH 59.1  04/25/2023   VD25OH 37 01/03/2010   Lab Results  Component Value Date   CREATININE 0.72 (L) 04/25/2023   BUN 11 04/25/2023   NA 140 04/25/2023   K 4.1 04/25/2023   CL 101 04/25/2023   CO2 26 04/25/2023      Component Value Date/Time   PROT 6.8 04/25/2023 0854   ALBUMIN 4.5 04/25/2023 0854   AST 14 04/25/2023 0854   ALT 20 04/25/2023 0854   ALKPHOS 79 04/25/2023 0854   BILITOT 0.5 04/25/2023 0854   BILIDIR 0.12 01/14/2021 0847   IBILI 0.4 11/03/2014 0800  - Labs indicate that his A1c levels are stable, however his Insulin levels are ~ 3 times normal.Labs are also indicative of normal kidney and liver function. - His hunger and cravings are controlled when eating on plan.   Plan:  - Unless pre-existing renal or cardiopulmonary conditions exist which patient was told to limit their fluid intake by another provider, I recommended roughly one half of their weight in pounds, to be the approximate ounces of non-caloric, non-caffeinated beverages they should drink per day; including more if they are engaging in exercise.  - I counseled patient on pathophysiology of the disease process of I.R. - Continue to decrease simple carbs/ sugars; increase fiber and proteins -> follow his meal plan.   - Handouts provided at pt's request after education provided.  All concerns/questions  addressed.   Jenson Burnard will continue to work on weight loss, exercise, via their meal plan we devised to help decrease the risk of progressing to diabetes.  - We will recheck A1c and fasting insulin level in approximately 3 months from last check, or as deemed appropriate.     Vitamin D deficiency Assessment: Condition is stable. Labs were reviewed with pt today and education provided on them. All questions were answered about them.   Lab Results  Component Value Date   VD25OH 59.1 04/25/2023   VD25OH 37 01/03/2010  - His Vitamin D levels are within the recommended normal limits of 50-80.  - He stopped taking OTC  Vitamin D 5,000 IU daily 2 weeks prior to labs being drawn here.   Plan: - Restart OTC Vitamin D 5,000 IU daily.  - weight loss will likely improve availability of vitamin D, thus encouraged Raquan to continue with meal plan and their weight loss efforts to further improve this condition.  Thus, we will need to monitor levels regularly (every 3-4 mo on average) to keep levels within normal limits and prevent over supplementation.   Mixed hyperlipidemia Assessment: Condition is not at goal. Labs were reviewed with pt today and education provided on them and how the foods patient eats may influence these findings. All questions were answered about them.   Lab Results  Component Value Date   CHOL 227 (H) 04/25/2023   HDL 55 04/25/2023   LDLCALC 155 (H) 04/25/2023   TRIG 97 04/25/2023   CHOLHDL 4.3 07/13/2022  Labs indicate that: - His cholesterol levels are elevated and above the recommended range of 100-199 mg/dL.  - His LDL levels are a;sp elevated and have worsened from 136 10 months ago to 155 mg/dL.  - All other components are within normal limits.   Plan:  - Snapper Decelles agrees to continue with our treatment plan of a heart-heathy, low cholesterol meal plan. - I encouraged pt to discuss with his cardiologist about possibly starting a cholesterol medication.  - We extensively discussed several lifestyle modifications today and he will continue to work on diet, exercise and weight loss efforts.  - I stressed the importance that patient continue with our prudent nutritional plan that is low in saturated and trans fats, and low in fatty carbs to improve these numbers.  - We recommend: aerobic activity with eventual goal of a minimum of 150+ min wk plus 2 days/ week of resistance or strength training.   - We will continue routine screening as patient continues to achieve health goals along their weight loss journey     Hypothyroidism, unspecified type Assessment: Condition is not  optimized. Labs were reviewed with pt today and education provided on them . All questions were answered about them.   Lab Results  Component Value Date   TSH 0.124 (L) 04/25/2023   FREET4 2.33 (H) 04/25/2023  - Labs indicate that his TSH is too low. Explained to pt that having too little TSH means that his thyroid gland is making excessive thyroid hormone.  - He has been compliant and tolerant with Synthroid 125 mcg BID. Denies any side effects.  Plan: - Decrease Synthroid dose to 112 mcg 2 po q am. Pt requested that we change the dose of his Synthroid med, not his PCP. - Will continue to monitor as deemed clinically necessary. We will recheck thyroid panel in 6-8 weeks.    TREATMENT PLAN FOR OBESITY: Obesity (BMI 30-39.9) Assessment:  Yvette Rack  Antonopoulos is here to discuss his progress with his obesity treatment plan along with follow-up of his obesity related diagnoses. See Medical Weight Management Flowsheet for complete bioelectrical impedance results.  Condition is improving. Biometric data collected today, was reviewed with patient.   Since last office visit patient's  Muscle mass has increased by 1.2 lb. Fat mass has decreased by 5.8 lb. Total body water has increased by 2.2 lb.  Counseling done on how various foods will affect these numbers and how to maximize success  Total lbs lost to date: - 5  Total weight loss percentage to date: 1.87    Plan:  - Continue the Category 2 meal plan with additional lunch options and 6 ounces of lean protein at lunch and 8-10 ounces of lean protein at dinner.   - Showed the pt the [employer] Milk and [place] Carb Counter Tortilla, which he can incorporate into his meal.   Behavioral Intervention Additional resources provided today: lunch options, Insulin resistance Handout Evidence-based interventions for health behavior change were utilized today including the discussion of self monitoring techniques, problem-solving barriers and SMART  goal setting techniques.   Regarding patient's less desirable eating habits and patterns, we employed the technique of small changes.  Pt will specifically work on: continue adherence to prudent nutritional for next visit.    Recommended Physical Activity Goals  Shivaan has been advised to slowly work up to 150 minutes of moderate intensity aerobic activity a week and strengthening exercises 2-3 times per week for cardiovascular health, weight loss maintenance and preservation of muscle mass.   He has agreed to Continue current level of physical activity    FOLLOW UP: Return in about 2 weeks (around 05/23/2023). He was informed of the importance of frequent follow up visits to maximize his success with intensive lifestyle modifications for his multiple health conditions.   Subjective:   Chief complaint: Obesity Daelin is here to discuss his progress with his obesity treatment plan. He is on the the Category 2 Plan with 6 ounces of lean protein at lunch and 8-10 ounces of lean protein at dinner and states he is following his eating plan approximately 70 % of the time. He states he is walking and swimming 35-40 minutes 7 days per week.  Interval History:  Dreylon Luscher is here today for his first follow-up office visit since starting the program with [place].    Since last office visit he: - Endorses eating off plan during the holiday weekend and on his wife's birthday.  - For lunch, he typically eats 1 can of [place] in water + greek yogurt + 1 piece of bread. - States that it was hard to get in all the proteins on the meal plan.  - Followed the meal plan 100% 3-4 days.  - Hunger and cravings were controlled when eating all the food on the meal plan.   All blood work/ lab tests that were recently ordered by myself or an outside provider were reviewed with patient today per their request. Extended time was spent counseling him on all new disease processes that were discovered or preexisting  ones that are affected by BMI.  he understands that many of these abnormalities will need to monitored regularly along with the current treatment plan of prudent dietary changes, in which we are making each and every office visit, to improve these health parameters.    Review of Systems:  Pertinent positives were addressed with patient today.   Weight Summary and  Biometrics   Weight Lost Since Last Visit: 5 lb  Weight Gained Since Last Visit: 0   Vitals Temp: 97.8 F (36.6 C) BP: 122/74 Pulse Rate: 61 SpO2: 99 %   Anthropometric Measurements Height: 5\' 11"  (1.803 m) Weight: 262 lb (118.8 kg) BMI (Calculated): 36.56 Weight at Last Visit: 267 lb Weight Lost Since Last Visit: 5 lb Weight Gained Since Last Visit: 0 Starting Weight: 267 lb Total Weight Loss (lbs): 5 lb (2.268 kg) Peak Weight: 315 lb   Body Composition  Body Fat %: 36.6 % Fat Mass (lbs): 96 lbs Muscle Mass (lbs): 158.2 lbs Total Body Water (lbs): 117.6 lbs Visceral Fat Rating : 22   Other Clinical Data Fasting: No Labs: No Today's Visit #: 2 Starting Date: 04/25/23 Comments: First 40 minute follow up   Objective:   PHYSICAL EXAM:  Blood pressure 122/74, pulse 61, temperature 97.8 F (36.6 C), height 5\' 11"  (1.803 m), weight 262 lb (118.8 kg), SpO2 99 %. Body mass index is 36.54 kg/m.  General: Well Developed, well nourished, and in no acute distress.  HEENT: Normocephalic, atraumatic Skin: Warm and dry, cap RF less 2 sec, good turgor Chest:  Normal excursion, shape, no gross abn Respiratory: speaking in full sentences, no conversational dyspnea NeuroM-Sk: Ambulates w/o assistance, moves * 4 Psych: A and O *3, insight good, mood-full  DIAGNOSTIC DATA REVIEWED:  BMET    Component Value Date/Time   NA 140 04/25/2023 0854   K 4.1 04/25/2023 0854   CL 101 04/25/2023 0854   CO2 26 04/25/2023 0854   GLUCOSE 95 04/25/2023 0854   GLUCOSE 91 08/23/2020 1723   BUN 11 04/25/2023 0854    CREATININE 0.72 (L) 04/25/2023 0854   CALCIUM 9.3 04/25/2023 0854   GFRNONAA >60 08/23/2020 1723   GFRAA >60 08/23/2020 1723   Lab Results  Component Value Date   HGBA1C 5.0 04/25/2023   HGBA1C 5.3 01/03/2010   Lab Results  Component Value Date   INSULIN 14.5 04/25/2023   Lab Results  Component Value Date   TSH 0.124 (L) 04/25/2023   CBC    Component Value Date/Time   WBC 5.3 04/25/2023 0854   WBC 5.7 08/23/2020 1723   RBC 4.84 04/25/2023 0854   RBC 4.54 08/23/2020 1723   HGB 14.5 04/25/2023 0854   HCT 42.2 04/25/2023 0854   PLT 219 04/25/2023 0854   MCV 87 04/25/2023 0854   MCH 30.0 04/25/2023 0854   MCH 29.3 08/23/2020 1723   MCHC 34.4 04/25/2023 0854   MCHC 34.6 08/23/2020 1723   RDW 12.2 04/25/2023 0854   Iron Studies No results found for: "IRON", "TIBC", "FERRITIN", "IRONPCTSAT" Lipid Panel     Component Value Date/Time   CHOL 227 (H) 04/25/2023 0854   TRIG 97 04/25/2023 0854   HDL 55 04/25/2023 0854   CHOLHDL 4.3 07/13/2022 0807   CHOLHDL 3.2 11/03/2014 0800   VLDL 19 11/03/2014 0800   LDLCALC 155 (H) 04/25/2023 0854   Hepatic Function Panel     Component Value Date/Time   PROT 6.8 04/25/2023 0854   ALBUMIN 4.5 04/25/2023 0854   AST 14 04/25/2023 0854   ALT 20 04/25/2023 0854   ALKPHOS 79 04/25/2023 0854   BILITOT 0.5 04/25/2023 0854   BILIDIR 0.12 01/14/2021 0847   IBILI 0.4 11/03/2014 0800      Component Value Date/Time   TSH 0.124 (L) 04/25/2023 0854   Nutritional Lab Results  Component Value Date   VD25OH 59.1 04/25/2023  VD25OH 37 01/03/2010    Attestations:   Reviewed by clinician on day of visit: allergies, medications, problem list, medical history, surgical history, family history, social history, and previous encounter notes.   Patient was in the office today and time spent on visit including pre-visit chart review and post-visit care/coordination of care and electronic medical record documentation was 50 minutes. 50% of the  time was in face to face counseling of this patient's medical condition(s) and providing education on treatment options to include the first-line treatment of diet and lifestyle modification.   I,Special Puri,acting as a Neurosurgeon for [employer], DO.,have documented all relevant documentation on the behalf of Thomasene Lot, DO,as directed by  Thomasene Lot, DO while in the presence of Thomasene Lot, DO.   I, Thomasene Lot, DO, have reviewed all documentation for this visit. The documentation on 05/09/23 for the exam, diagnosis, procedures, and orders are all accurate and complete.

## 2023-05-14 NOTE — Telephone Encounter (Signed)
Patient called to follow-up on CPAP orders being sent to [facility].

## 2023-05-23 ENCOUNTER — Telehealth: Payer: Self-pay | Admitting: Cardiovascular Disease

## 2023-05-23 NOTE — Telephone Encounter (Signed)
Will forward to sleep coordinator. 

## 2023-05-23 NOTE — Telephone Encounter (Signed)
Pt is requesting a callback to discuss him picking up a hard copy of the prescription for him to get a new CPAP machine since he has an appt next week at the [facility] in [place]. He stated the fax number is [phone] as well if it could be faxed to them. He'd also like to receive the [facility] authorization number since he can't find it and lost the paperwork that had it.

## 2023-05-24 ENCOUNTER — Ambulatory Visit (INDEPENDENT_AMBULATORY_CARE_PROVIDER_SITE_OTHER): Payer: No Typology Code available for payment source | Admitting: Family Medicine

## 2023-05-24 ENCOUNTER — Other Ambulatory Visit (HOSPITAL_BASED_OUTPATIENT_CLINIC_OR_DEPARTMENT_OTHER): Payer: Self-pay

## 2023-05-24 ENCOUNTER — Encounter (INDEPENDENT_AMBULATORY_CARE_PROVIDER_SITE_OTHER): Payer: Self-pay | Admitting: Family Medicine

## 2023-05-24 VITALS — BP 130/83 | HR 70 | Temp 97.8°F | Ht 71.0 in | Wt 254.0 lb

## 2023-05-24 DIAGNOSIS — Z6835 Body mass index (BMI) 35.0-35.9, adult: Secondary | ICD-10-CM

## 2023-05-24 DIAGNOSIS — E669 Obesity, unspecified: Secondary | ICD-10-CM

## 2023-05-24 DIAGNOSIS — E88819 Insulin resistance, unspecified: Secondary | ICD-10-CM

## 2023-05-24 DIAGNOSIS — E538 Deficiency of other specified B group vitamins: Secondary | ICD-10-CM

## 2023-05-24 DIAGNOSIS — E039 Hypothyroidism, unspecified: Secondary | ICD-10-CM | POA: Diagnosis not present

## 2023-05-24 MED ORDER — LEVOTHYROXINE SODIUM 112 MCG PO TABS
224.0000 ug | ORAL_TABLET | Freq: Every morning | ORAL | 0 refills | Status: DC
Start: 2023-05-24 — End: 2023-06-07
  Filled 2023-05-24: qty 60, 30d supply, fill #0

## 2023-05-24 NOTE — Progress Notes (Signed)
Samuel Goodwin, D.O.  ABFM, ABOM Specializing in Clinical Bariatric Medicine  Office located at: [address] [place], [place]  [address]     Assessment and Plan:   Medications Discontinued During This Encounter  Medication Reason   levothyroxine (SYNTHROID) 112 MCG tablet Reorder     Meds ordered this encounter  Medications   levothyroxine (SYNTHROID) 112 MCG tablet    Sig: Take 2 tablets (224 mcg total) by mouth every morning.    Dispense:  60 tablet    Refill:  0     Insulin resistance Assessment: Condition diet/exercise controlled and not optimized. Samuel Goodwin endorses that he sometimes feels hungry and has cravings. He notes that this typically happens when he does not eat enough protein at night.   Lab Results  Component Value Date   HGBA1C 5.0 04/25/2023   HGBA1C 5.2 07/13/2022   HGBA1C 5.1 12/21/2021   INSULIN 14.5 04/25/2023    Plan: Reminded patient that having adequate protein with each meal is important for stabilizing sugars and an important part of controlling hunger and cravings.  Samuel Goodwin will continue to work on weight loss, exercise, via their meal plan we devised to help decrease the risk of progressing to prediabetes. We will recheck A1c and fasting insulin level in approximately 3 months from last check, or as deemed appropriate.    Vitamin B12 deficiency Assessment: Condition is not optimized. Endorses taking OTC B12 500 mcg daily and is tolerating well. No concerns.   Lab Results  Component Value Date   VITAMINB12 421 04/25/2023   Plan: Continue with OTC supplement. Continue his prudent nutritional plan that is low in simple carbohydrates, saturated fats and trans fats to goal of 5-10% weight loss to achieve significant health benefits. Will continue to monitor condition as it relates to his weight loss journey.    Hypothyroidism, unspecified type Assessment: *** I again explained to patient that he has the diagnosis of hypothyroidism and  that his dosage of Synthroid was too high in the past, which put him in a hyperthyroid state.   Patient endorses feeling fatigued and feels that this maybe due to life stressors and the adjustment in Synthroid medication that we made on 05/09/23. Pt declines to decrease or discontinue the medicine at this time. No other concerns.    Plan: Continue with med as prescribed. Will refill this today.  Will recheck thyroid panel in 6-8 weeks (July 10th -20th).     TREATMENT PLAN FOR OBESITY: Obesity (BMI 30-39.9) Assessment: Samuel Goodwin is here to discuss his progress with his obesity treatment plan along with follow-up of his obesity related diagnoses. See Medical Weight Management Flowsheet for complete bioelectrical impedance results.  Condition is improving, but not optimized. Biometric data collected today, was reviewed with patient.   Since last office visit on 05/09/23 patient's  Muscle mass has decreased by 4.2 lb. Fat mass has decreased by 3.2 lb. Total body water has decreased by 4.4 lb.  Counseling done on how various foods will affect these numbers and how to maximize success  Total lbs lost to date: 13 Total weight loss percentage to date: 4.87   Plan: Continue the Category 2 meal plan with additional lunch options and 6 ounces of lean protein at lunch and 8-10 ounces of lean protein at dinner.   - Recommended pt to try the [employer] Light Cheese Wedges  Behavioral Intervention Additional resources provided today: Protein substitution handout Evidence-based interventions for health behavior change were  utilized today including the discussion of self monitoring techniques, problem-solving barriers and SMART goal setting techniques.   Regarding patient's less desirable eating habits and patterns, we employed the technique of small changes.  Pt will specifically work on: increasing his lean protein intake for next visit.    Recommended Physical Activity Goals  Samuel Goodwin has  been advised to slowly work up to 150 minutes of moderate intensity aerobic activity a week and strengthening exercises 2-3 times per week for cardiovascular health, weight loss maintenance and preservation of muscle mass.   He has agreed to Continue current level of physical activity   FOLLOW UP: Return in about 2 weeks (around 06/07/2023). He was informed of the importance of frequent follow up visits to maximize his success with intensive lifestyle modifications for his multiple health conditions.   Subjective:   Chief complaint: Obesity Samuel Goodwin is here to discuss his progress with his obesity treatment plan. He is on  the Category 2 Plan with additional lunch options and 6 ounces of lean protein at lunch and 8-10 ounces of lean protein at dinner and states he is following his eating plan approximately 95% of the time. He states he is exercising 40-60 minutes 5 days per week.  Interval History:  Samuel Goodwin is here for a follow up office visit.     Since last office visit:    - Samuel Goodwin has been doing well. He reports eating 6 ounces of protein at lunch and sometimes only eating 6 ounces at night.  - When he is only able to get 6 ounces at night, patient endorses having more hunger and cravings.   - Overall, he is satisfied with the meal plan.   Review of Systems:  Pertinent positives were addressed with patient today.  Reviewed by clinician on day of visit: allergies, medications, problem list, medical history, surgical history, family history, social history, and previous encounter notes.  Weight Summary and Biometrics   Weight Lost Since Last Visit: 8lb  No data recorded   Vitals Temp: 97.8 F (36.6 C) BP: 130/83 Pulse Rate: 70 SpO2: 99 %   Anthropometric Measurements Height: 5\' 11"  (1.803 m) Weight: 254 lb (115.2 kg) BMI (Calculated): 35.44 Weight at Last Visit: 262lb Weight Lost Since Last Visit: 8lb Starting Weight: 267lb Total Weight Loss (lbs): 13 lb  (5.897 kg) Peak Weight: 315lb   Body Composition  Body Fat %: 36.4 % Fat Mass (lbs): 92.8 lbs Muscle Mass (lbs): 154 lbs Total Body Water (lbs): 113.2 lbs Visceral Fat Rating : 21   Other Clinical Data Fasting: yes Labs: no Today's Visit #: 3 Starting Date: 04/25/23   Objective:   PHYSICAL EXAM: Blood pressure 130/83, pulse 70, temperature 97.8 F (36.6 C), height 5\' 11"  (1.803 m), weight 254 lb (115.2 kg), SpO2 99 %. Body mass index is 35.43 kg/m.  General: Well Developed, well nourished, and in no acute distress.  HEENT: Normocephalic, atraumatic Skin: Warm and dry, cap RF less 2 sec, good turgor Chest:  Normal excursion, shape, no gross abn Respiratory: speaking in full sentences, no conversational dyspnea NeuroM-Sk: Ambulates w/o assistance, moves * 4 Psych: A and O *3, insight good, mood-full  DIAGNOSTIC DATA REVIEWED:  BMET    Component Value Date/Time   NA 140 04/25/2023 0854   K 4.1 04/25/2023 0854   CL 101 04/25/2023 0854   CO2 26 04/25/2023 0854   GLUCOSE 95 04/25/2023 0854   GLUCOSE 91 08/23/2020 1723   BUN 11 04/25/2023 0854  CREATININE 0.72 (L) 04/25/2023 0854   CALCIUM 9.3 04/25/2023 0854   GFRNONAA >60 08/23/2020 1723   GFRAA >60 08/23/2020 1723   Lab Results  Component Value Date   HGBA1C 5.0 04/25/2023   HGBA1C 5.3 01/03/2010   Lab Results  Component Value Date   INSULIN 14.5 04/25/2023   Lab Results  Component Value Date   TSH 0.124 (L) 04/25/2023   CBC    Component Value Date/Time   WBC 5.3 04/25/2023 0854   WBC 5.7 08/23/2020 1723   RBC 4.84 04/25/2023 0854   RBC 4.54 08/23/2020 1723   HGB 14.5 04/25/2023 0854   HCT 42.2 04/25/2023 0854   PLT 219 04/25/2023 0854   MCV 87 04/25/2023 0854   MCH 30.0 04/25/2023 0854   MCH 29.3 08/23/2020 1723   MCHC 34.4 04/25/2023 0854   MCHC 34.6 08/23/2020 1723   RDW 12.2 04/25/2023 0854   Iron Studies No results found for: "IRON", "TIBC", "FERRITIN", "IRONPCTSAT" Lipid Panel      Component Value Date/Time   CHOL 227 (H) 04/25/2023 0854   TRIG 97 04/25/2023 0854   HDL 55 04/25/2023 0854   CHOLHDL 4.3 07/13/2022 0807   CHOLHDL 3.2 11/03/2014 0800   VLDL 19 11/03/2014 0800   LDLCALC 155 (H) 04/25/2023 0854   Hepatic Function Panel     Component Value Date/Time   PROT 6.8 04/25/2023 0854   ALBUMIN 4.5 04/25/2023 0854   AST 14 04/25/2023 0854   ALT 20 04/25/2023 0854   ALKPHOS 79 04/25/2023 0854   BILITOT 0.5 04/25/2023 0854   BILIDIR 0.12 01/14/2021 0847   IBILI 0.4 11/03/2014 0800      Component Value Date/Time   TSH 0.124 (L) 04/25/2023 0854   Nutritional Lab Results  Component Value Date   VD25OH 59.1 04/25/2023   VD25OH 37 01/03/2010    Attestations:   I, Samuel Goodwin, acting as a Stage manager for [employer], DO., have compiled all relevant documentation for today's office visit on behalf of Samuel Lot, DO, while in the presence of [employer], DO.  I have reviewed the above documentation for accuracy and completeness, and I agree with the above. Samuel Goodwin, D.O.  The 21st Century Cures Act was signed into law in 2016 which includes the topic of electronic health records.  This provides immediate access to information in MyChart.  This includes consultation notes, operative notes, office notes, lab results and pathology reports.  If you have any questions about what you read please let us know at your next visit so we can discuss your concerns and take corrective action if need be.  We are right here with you.

## 2023-06-06 ENCOUNTER — Encounter: Payer: Self-pay | Admitting: Cardiovascular Disease

## 2023-06-06 ENCOUNTER — Ambulatory Visit
Payer: No Typology Code available for payment source | Attending: Cardiovascular Disease | Admitting: Cardiovascular Disease

## 2023-06-06 DIAGNOSIS — I10 Essential (primary) hypertension: Secondary | ICD-10-CM

## 2023-06-06 DIAGNOSIS — G4733 Obstructive sleep apnea (adult) (pediatric): Secondary | ICD-10-CM

## 2023-06-06 DIAGNOSIS — E7800 Pure hypercholesterolemia, unspecified: Secondary | ICD-10-CM

## 2023-06-06 NOTE — Progress Notes (Unsigned)
Cardiology Office Note    Date:  06/07/2023   ID:  Samuel, Goodwin Feb 27, 1961, MRN [id number]  PCP:  de Peru, Raymond J, MD  Cardiologist:  Nicki Guadalajara, MD (sleep); Dr. Allyson Sabal  4 month sleep evaluation   History of Present Illness:  Samuel Goodwin is a 62 y.o. male who has a long history of obstructive sleep apnea and has been on CPAP therapy since 2012.  2012.  I had not seen him previously, but saw him February 02, 2023 for new sleep consultation and evaluation.  He presents now for 28-month follow-up evaluation.  Samuel Goodwin previously served in the [employer] for 4 and half years while in [place].  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  He had multiple injuries and continues to be followed at the [place].  With hiis ongoing orthopedic issues, he goes to the pool for aquatic therapy 5 days/week.  He has a history of obesity, hypertension, hypothyroidism, and obstructive sleep apnea.  In 2012 had undergone initial sleep evaluation at the [facility] sleep [facility] which showed moderate overall sleep apnea with an AHI of 16.9/h but during REM sleep sleep apnea was very severe with an AHI of 60.6/h and events were even more severe with supine sleep AHI 82.7/h.  The lowest oxygen desaturation during non-REM sleep was 79% and with REM sleep was 74%.  He had loud snoring.  Following that sleep study, he ultimately initiated BiPAP therapy.  I do not have any additional records.  He apparently received a new ResMed air curve 10 via auto BiPAP machine in December 2017.  He has not been evaluated in sleep clinic.  Recently, he has noticed that his machine is cutting off in the middle of the night.  He typically goes to bed around 1030 and wakes up at 4:45 AM.  He admits to continued use with his BiPAP machine.  A download was obtained from January 24 through February 01, 2021 which shows 100% usage.  Average use 6 hours and 31 minutes.  His BiPAP is set at a set pressure of 19/15.  AHI  is 1.1.  He has had multiple orthopedic issues has had bilateral knee and hip surgeries.  He wears knee braces for support.  He also has a history of vocal cord carcinoma.  He sees Dr. de Peru for primary care.  He is now referred for sleep consultation and evaluation.  When I Initially saw him, he was on losartan 50 mg daily and HCTZ 25 mg for hypertension. He is on levothyroxine 250 mcg daily for hypothyroidism, and pantoprazole for GERD.  During my February 2024 evaluation, he was using a ResMed air curve 10 BiPAP unit he received in 2017.  His unit was now 38 and half years old and recently he was having issues where his machine stop at times in the middle of the night.  It had been intermittently working.  For that initial sleep evaluation I had a lengthy discussion with him regarding potential adverse cardiovascular consequences associated with untreated sleep apnea and the significant benefit of optimal of his sleep apnea.  I discussed potential negative adverse cardiovascular consequences including effect hypertension, potential for nocturnal arrhythmias, risk of atrial fibrillation, effects on insulin resistance, increased inflammation, nocturnal GERD, potential nocturnal hypoxemia contributing to ischemia.  At that time, I told him that ResMed was about to release a new ResMed air curve 11 auto unit.  With his machine being  older than 6-1/2 years and having issues I felt it would be best for him to receive a new machine and rather than have his pressures to be set at a fixed mode of 19/15, that with his auto unit this can be changed to an EPAP minimum of 15, pressure support of 4, and if necessary IPAP maximum will be increased to 25.  Currently, Samuel. Samuel Goodwin never received a new machine.  [facility] also [facility] that he be allowed continuous care to be followed by me for his sleep apnea catheterization.  Since I last saw him, he continues to use his old machine is not working  consistently.  I obtained a download from May 28 through June 06, 2023.  Compliance is excellent with average use at 6 hours and 18 minutes.  There was only 1 day of nonuse.  At his set pressure of 19/15, AHI is 5.7.  He cannot sleep without it.  He believes his sleep is restorative.  [facility]  He states since the beginning of the year he is lost 45 pounds.  This has been predominantly through dietary adjustment with reduction of carbohydrates, increased protein and vegetables.  He presents to the office today for new BiPAP instructions to send to the [place] in follow-up evaluation.   Past Medical History:  Diagnosis Date   Alcohol abuse    Sobriety since 1990   Anxiety    Arthritis    oa   Back pain    Benign positional vertigo    Chronic fatigue    Depression    Drug use    Ear problem, right    Eardrum trauma    30 years ago/right ear   Fatty liver    GERD (gastroesophageal reflux disease)    H/O total hip arthroplasty 02/05/2016   Headache    sinus   Hepatitis C 1994   hep c - ? etiology; in [place] 1989-91; S/P interferon , Dr Kinnie Scales   Hip fracture (HCC) 11/13/2018   Hip instability 02/05/2016   History of bronchitis    History of iron deficiency anemia    Hoarseness    HOARSENESS, CHRONIC 05/31/2009   Qualifier: Diagnosis of   By: Leone Payor MD, Charlyne Quale     S/P laser surgery X 3 , Dr Delford Field , [facility]      Hyperlipidemia    Hypertension    Hypothyroidism    Joint pain    Morbid obesity (HCC) 12/20/2016   Morbid obesity   NONSPECIFIC ABNORMAL ELECTROCARDIOGRAM 01/06/2010   Qualifier: Diagnosis of   By: Alwyn Ren MD, Chrissie Noa       Obesity    OSA (obstructive sleep apnea)    on CPAP   Osteoarthritis    PONV (postoperative nausea and vomiting)    following knee surgery last year    PTSD (post-traumatic stress disorder)    Reactive airway disease 11/06/2018   S/P knee replacement 11/27/2014   S/P right TH revision  02/09/2016   S/P right THA, AA 02/01/2016   Sinus problem    Sleep apnea    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   COLONOSCOPY  03/2013   HIP CLOSED REDUCTION Right 02/05/2016   Procedure: CLOSED MANIPULATION HIP;  Surgeon: Ranee Gosselin, MD;  Location: [facility];  Service: Orthopedics;  Laterality: Right;   knee arthroscopic Bilateral    LARYNGOSCOPY  08/2009   Dr.[name]   NASAL FRACTURE SURGERY     age 65   right knee arthroscopic knee surgery  12 yrs ago   dr Thomasena Edis   THROAT SURGERY  aug, sept, Nov 16 2016   vocal cord  laser sugery X 3, Dr Delford Field , [facility]   TOTAL HIP ARTHROPLASTY Right 02/01/2016   Procedure: RIGHT TOTAL HIP ARTHROPLASTY ANTERIOR APPROACH;  Surgeon: Durene Romans, MD;  Location: [facility];  Service: Orthopedics;  Laterality: Right;   TOTAL HIP ARTHROPLASTY Left 12/19/2016   Procedure: LEFT TOTAL HIP ARTHROPLASTY ANTERIOR APPROACH;  Surgeon: Durene Romans, MD;  Location: [facility];  Service: Orthopedics;  Laterality: Left;   TOTAL HIP REVISION Right 11/11/2018   Procedure: right total hip arthroplasty revision, femoral stem;  Surgeon: Durene Romans, MD;  Location: [facility];  Service: Orthopedics;  Laterality: Right;    TOTAL KNEE ARTHROPLASTY Left 11/27/2014   Procedure: LEFT TOTAL KNEE ARTHROPLASTY;  Surgeon: Verlee Rossetti, MD;  [facility] OR;  Service: Orthopedics;  Laterality: Left;    TOTAL KNEE ARTHROPLASTY Right 12/16/2019   Procedure: TOTAL KNEE ARTHROPLASTY;  Surgeon: Durene Romans, MD;  Location: [facility];  Service: Orthopedics;  Laterality: Right;  70 mins   TRANSTHORACIC ECHOCARDIOGRAM  11/08/2005   EF 68%, normal LV systolic function   UPPER GASTROINTESTINAL ENDOSCOPY  2010   Negative, Dr.[name]    Current Medications: Outpatient Medications Prior to Visit  Medication Sig Dispense Refill   ACAI BERRY PO Take by mouth.     amoxicillin (AMOXIL) 500 MG capsule Take 4 capsules (2,000 mg total) by mouth 1 hour prior to appointment. 8 capsule PRN   Ascorbic Acid (VITAMIN C) 1000 MG tablet Take 1,000 mg by mouth daily.     aspirin 81 MG chewable tablet      b complex vitamins tablet Take 2 tablets by mouth daily.      BEE POLLEN PO Take by mouth.     carboxymethylcellulose (REFRESH PLUS) 0.5 % SOLN INSTILL 1 DROP IN BOTH EYES FOUR TIMES A DAY     celecoxib (CELEBREX) 200 MG capsule Take 1 capsule by mouth daily as needed.     cyanocobalamin (VITAMIN B12) 500 MCG tablet 300- 500 mcg daily     cyclobenzaprine (FLEXERIL) 10 MG tablet Take 1 tablet 3 times a day by oral route as needed. 30 tablet 1   diclofenac Sodium (VOLTAREN) 1 % GEL APPLY TO KNEE AND HIPS AS NEEDED 500 g 2   fluticasone (FLONASE) 50 MCG/ACT nasal spray Place 2 spray into both nostrils once daily. 16 g 6   hydrochlorothiazide (HYDRODIURIL) 25 MG tablet Take 1 tablet (25 mg total) by mouth daily. 90 tablet 3   levothyroxine (SYNTHROID) 112 MCG tablet Take 2 tablets (224 mcg total) by mouth every morning. 60 tablet 0   losartan (COZAAR) 100 MG tablet TAKE ONE-HALF TABLET BY MOUTH ONCE A DAY  Multiple Vitamin (MULTIVITAMIN) tablet Take 1 tablet by mouth every morning.     omeprazole (PRILOSEC) 40 MG capsule Take 1 capsule by mouth 2 (two) times daily.     praziquantel (BILTRICIDE) 600 MG tablet Take one tablet by mouth three times daily for 1 day, repeat dose in 2-4 weeks 6 tablet 0   Turmeric (QC  TUMERIC COMPLEX PO) Take by mouth.     Vitamin D, Cholecalciferol, 25 MCG (1000 UT) CAPS 5,000 IU  daily     No facility-administered medications prior to visit.     Allergies:   Dilaudid [hydromorphone hcl], Lipitor [atorvastatin], Pitavastatin, Simvastatin, and Benazepril hcl   Social History   Socioeconomic History   Marital status: Married    Spouse name: [name]   Number of children: 3   Years of education: 12   Highest education level: Associate degree: occupational, Scientist, product/process development, or vocational program  Occupational History   Not on file  Tobacco Use   Smoking status: Never   Smokeless tobacco: Never   Tobacco comments:    Quit at age 46  Vaping Use   Vaping Use: Never used  Substance and Sexual Activity   Alcohol use: Not Currently    Comment: alcoholism quit 30 years ago    Drug use: Not Currently   Sexual activity: Yes  Other Topics Concern   Not on file  Social History Narrative   Lives with wife   No caffeine   Social Determinants of Health   Financial Resource Strain: Not on file  Food Insecurity: Not on file  Transportation Needs: Not on file  Physical Activity: Not on file  Stress: Not on file  Social Connections: Not on file    Socially he was born in [place] and went to [employer].  He is married for 34 years.  There is a prior tobacco history.   Family History:  The patient's family history includes Aneurysm in his maternal aunt; Cirrhosis in his father; Coronary artery disease in his maternal uncle; Diabetes in his father; Heart attack (age of onset: 77) in his brother; Heart failure in his mother; Hypertension in his mother; Stroke in his father; Thyroid disease in his mother.   ROS General: Negative; No fevers, chills, or night sweats;  HEENT: Negative; No changes in vision or hearing, sinus congestion, difficulty swallowing Pulmonary: Negative; No cough, wheezing, shortness of breath, hemoptysis Cardiovascular: Negative; No  chest pain, presyncope, syncope, palpitations GI: Negative; No nausea, vomiting, diarrhea, or abdominal pain GU: Negative; No dysuria, hematuria, or difficulty voiding Musculoskeletal: Bilateral hip and knee surgeries, wears knee braces bilaterally Hematologic/Oncology: Negative; no easy bruising, bleeding Endocrine: Negative; no heat/cold intolerance; no diabetes Neuro: Negative; no changes in balance, headaches Skin: Negative; No rashes or skin lesions Psychiatric: Negative; No behavioral problems, depression Sleep: Obstructive sleep apnea originally diagnosed in 2012 with replacement machine on December 09, 2016  He is unaware of any breakthrough snoring.  An Epworth Sleepiness Scale score endorsed at 11 as shown below:  Epworth Sleepiness Scale: Situation   Chance of Dozing/Sleeping (0 = never , 1 = slight chance , 2 = moderate chance , 3 = high chance )   sitting and reading 3   watching TV 3   sitting inactive in a public place 0   being a passenger in a motor vehicle for an hour or more 1   lying down in the afternoon 2   sitting and talking to someone 2   sitting  quietly after lunch (no alcohol) 0   while stopped for a few minutes in traffic as the driver 0   Total Score  11     Other comprehensive 14 point system review is negative.   PHYSICAL EXAM:   VS:  BP 126/84   Pulse (!) 58   Ht 5\' 11"  (1.803 m)   Wt 264 lb 12.8 oz (120.1 kg)   SpO2 93%   BMI 36.93 kg/m     Repeat blood pressure by me was 120/74  He admits to a 45 pound weight loss since January 2024.  Wt Readings from Last 3 Encounters:  06/06/23 264 lb 12.8 oz (120.1 kg)  05/24/23 254 lb (115.2 kg)  05/09/23 262 lb (118.8 kg)    General: Alert, oriented, no distress.  Skin: normal turgor, no rashes, warm and dry HEENT: Normocephalic, atraumatic. Pupils equal round and reactive to light; sclera anicteric; extraocular muscles intact;  Nose without nasal septal hypertrophy Mouth/Parynx benign;  Mallinpatti scale 3/4 Neck: No JVD, no carotid bruits; normal carotid upstroke Lungs: clear to ausculatation and percussion; no wheezing or rales Chest wall: without tenderness to palpitation Heart: PMI not displaced, RRR, s1 s2 normal, 1/6 systolic murmur, no diastolic murmur, no rubs, gallops, thrills, or heaves Abdomen: soft, nontender; no hepatosplenomehaly, BS+; abdominal aorta nontender and not dilated by palpation. Back: no CVA tenderness Pulses 2+ Musculoskeletal: full range of motion, normal strength, no joint deformities Extremities: no clubbing cyanosis or edema, Homan's sign negative  Neurologic: grossly nonfocal; Cranial nerves grossly wnl Psychologic: Normal mood and affect    Studies/Labs Reviewed:    EKG Interpretation Date/Time:  Wednesday June 06 2023 08:01:23 EDT Ventricular Rate:  58 PR Interval:  146 QRS Duration:  94 QT Interval:  448 QTC Calculation: 439 R Axis:   9  Text Interpretation: Sinus bradycardia When compared with ECG of 12-Dec-2018 13:49, Premature ventricular complexes are no longer Present Confirmed by Nicki Guadalajara ([id number]) on 06/06/2023 8:57:46 AM    February 02, 2023 ECG (independently read by me): NSR at 72, no ectopy, normal intervals  Recent Labs:    Latest Ref Rng & Units 04/25/2023    8:54 AM 07/13/2022    8:07 AM 12/21/2021    9:42 AM  BMP  Glucose 70 - 99 mg/dL 95  95  91   BUN 8 - 27 mg/dL 11  11  14    Creatinine 0.76 - 1.27 mg/dL 6.04  5.40  9.81   BUN/Creat Ratio 10 - 24 15  12  18    Sodium 134 - 144 mmol/L 140  140  137   Potassium 3.5 - 5.2 mmol/L 4.1  4.6  4.1   Chloride 96 - 106 mmol/L 101  101  100   CO2 20 - 29 mmol/L 26  26  25    Calcium 8.6 - 10.2 mg/dL 9.3  8.8  9.3         Latest Ref Rng & Units 04/25/2023    8:54 AM 07/13/2022    8:07 AM 12/21/2021    9:42 AM  Hepatic Function  Total Protein 6.0 - 8.5 g/dL 6.8  6.8  6.9   Albumin 3.9 - 4.9 g/dL 4.5  4.2  4.4   AST 0 - 40 IU/L 14  19  16    ALT 0 - 44 IU/L 20   21  19    Alk Phosphatase 44 - 121 IU/L 79  73  78   Total Bilirubin 0.0 - 1.2 mg/dL 0.5  0.3  0.3        Latest Ref Rng & Units 04/25/2023    8:54 AM 07/13/2022    8:07 AM 12/21/2021    9:42 AM  CBC  WBC 3.4 - 10.8 x10E3/uL 5.3  5.5  6.9   Hemoglobin 13.0 - 17.7 g/dL 16.1  09.6  04.5   Hematocrit 37.5 - 51.0 % 42.2  41.1  41.6   Platelets 150 - 450 x10E3/uL 219  217  278    Lab Results  Component Value Date   MCV 87 04/25/2023   MCV 88 07/13/2022   MCV 88 12/21/2021   Lab Results  Component Value Date   TSH 0.124 (L) 04/25/2023   Lab Results  Component Value Date   HGBA1C 5.0 04/25/2023     BNP No results found for: "BNP"  ProBNP No results found for: "PROBNP"   Lipid Panel     Component Value Date/Time   CHOL 227 (H) 04/25/2023 0854   TRIG 97 04/25/2023 0854   HDL 55 04/25/2023 0854   CHOLHDL 4.3 07/13/2022 0807   CHOLHDL 3.2 11/03/2014 0800   VLDL 19 11/03/2014 0800   LDLCALC 155 (H) 04/25/2023 0854   LABVLDL 17 04/25/2023 0854     RADIOLOGY: No results found.   Additional studies/ records that were reviewed today include:   I was able to obtain his initial sleep study from the [facility] dated August 09, 2011   ASSESSMENT:    1. Obstructive sleep apnea syndrome on BiPAP   2. Primary hypertension   3. Pure hypercholesterolemia   4. 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  He has a history of hypertension, hyperlipidemia, and hypothyroidism.  He has been followed by Dr. Allyson Sabal for cardiology care.  He is status post multiple orthopedic surgeries of both knees and hips and remotely had suffered vocal cord carcinoma.  His initial sleep study in 2012 showed moderate overall sleep apnea however events were severe during REM sleep with an AHI of 60.6/h and even more severe with supine sleep with an AHI of 82.7/h.  He had an initial  BiPAP device and apparently on December 09, 2016 he received a ResMed air curve 10 auto BiPAP unit.  His unit is now 42-1/62 years old and he recently has had issues where his machine in the middle of the night.  He now uses [employer] as his DME company.  He typically goes to bed around 10:30 PM and wakes up at 4:45 PM.  He cannot sleep without his BiPAP unit.  I had a discussion with him today concerning the benefit of sleep apnea particularly with reference to untreated sleep apnea contributing to adverse cardiovascular consequences including its effect on blood pressure control, nocturnal arrhythmias, risk of atrial fibrillation, effects on insulin resistance, GERD, and potential nocturnal hypoxemia contributing to ischemia.  I discussed with him that ResMed will be releasing their new ResMed AirCurve 11 VAuto unit and we will request that he obtain the new device once available.  His most recent download shows that he has been set at a fixed pressure of 19/15.  Since I initially saw him, Samuel. Schaller has not received a new machine.  I commended him on his weight loss of 45 pounds since January and since my evaluation in February 2024 he has lost proximately 20 pounds.  At the  time I saw him, the ResMed was just about to release a ResMed air curve 11 V auto unit.  I have written a prescription for him to give to the [facility] so that he can get the machine through them.  Rather than his current set pressure settings of 19/15 I have recommended his new device at a pressure set at EPAP min of 15, pressure support of 4, and IPAP max of 25.  He also told me we he will need continuous care authorization so that we can continue to follow his CPAP device.  He believes he is sleeping well.  He cannot sleep without it.  His most recent download from May 28 through June 06, 2023 shows an AHI of 5.7 at his current pressure. I will change his current old device since it is an auto unit to my planned settings of his new machine to  see if we can reduce continued events at his current pressure.  I commended him on his weight loss which undoubtedly will be beneficial in control of his sleep apnea as well as cardiovascular issues.  He will be seeing Dr. Allyson Sabal next week for follow-up cardiology evaluation.  Of note, recent laboratory on Apr 25, 2023 did show significant cholesterol elevation with total cholesterol 227, LDL 155, triglycerides 97, and HDL 55. He is not  on lipid-lowering therapy.  He continues to be on losartan 50 mg and HCTZ 25 mg for hypertension and levothyroxine 224 mcg for hypothyroidism.  I will see him within 90 days of him receiving his new BiPAP auto device for up compliance and follow-up evaluation   Medication Adjustments/Labs and Tests Ordered: Current medicines are reviewed at length with the patient today.  Concerns regarding medicines are outlined above.  Medication changes, Labs and Tests ordered today are listed in the Patient Instructions below. Patient Instructions  Medication Instructions:  NO CHANGES  *If you need a refill on your cardiac medications before your next appointment, please call your pharmacy*   Follow-Up: At [facility], you and your health needs are our priority.  As part of our continuing mission to provide you with exceptional heart care, we have created designated Provider Care Teams.  These Care Teams include your primary Cardiologist (physician) and Advanced Practice Providers (APPs -  Physician Assistants and Nurse Practitioners) who all work together to provide you with the care you need, when you need it.  We recommend signing up for the patient portal called "MyChart".  Sign up information is provided on this After Visit Summary.  MyChart is used to connect with patients for Virtual Visits (Telemedicine).  Patients are able to view lab/test results, encounter notes, upcoming appointments, etc.  Non-urgent messages can be sent to your provider as well.   To learn  more about what you can do with MyChart, go to [email or web address].    Your next appointment:    October 2024 with Dr. Tresa Endo - sleep clinic    Signed, Nicki Guadalajara, MD, [facility], ABSM Diplomate, American Board of Sleep Medicine  06/07/2023 1:22 PM    [facility] [address], [place], [place]  [address] Phone: [phone]

## 2023-06-06 NOTE — Patient Instructions (Signed)
Medication Instructions:  NO CHANGES  *If you need a refill on your cardiac medications before your next appointment, please call your pharmacy*   Follow-Up: At [facility], you and your health needs are our priority.  As part of our continuing mission to provide you with exceptional heart care, we have created designated Provider Care Teams.  These Care Teams include your primary Cardiologist (physician) and Advanced Practice Providers (APPs -  Physician Assistants and Nurse Practitioners) who all work together to provide you with the care you need, when you need it.  We recommend signing up for the patient portal called "MyChart".  Sign up information is provided on this After Visit Summary.  MyChart is used to connect with patients for Virtual Visits (Telemedicine).  Patients are able to view lab/test results, encounter notes, upcoming appointments, etc.  Non-urgent messages can be sent to your provider as well.   To learn more about what you can do with MyChart, go to [email or web address].    Your next appointment:    October 2024 with Dr. Tresa Endo - sleep clinic

## 2023-06-07 ENCOUNTER — Ambulatory Visit (INDEPENDENT_AMBULATORY_CARE_PROVIDER_SITE_OTHER): Payer: No Typology Code available for payment source | Admitting: Family Medicine

## 2023-06-07 ENCOUNTER — Encounter: Payer: Self-pay | Admitting: Cardiovascular Disease

## 2023-06-07 ENCOUNTER — Encounter (INDEPENDENT_AMBULATORY_CARE_PROVIDER_SITE_OTHER): Payer: Self-pay | Admitting: Family Medicine

## 2023-06-07 ENCOUNTER — Other Ambulatory Visit (HOSPITAL_BASED_OUTPATIENT_CLINIC_OR_DEPARTMENT_OTHER): Payer: Self-pay

## 2023-06-07 VITALS — BP 127/72 | HR 56 | Temp 97.8°F | Ht 71.0 in | Wt 254.0 lb

## 2023-06-07 DIAGNOSIS — E039 Hypothyroidism, unspecified: Secondary | ICD-10-CM

## 2023-06-07 DIAGNOSIS — E669 Obesity, unspecified: Secondary | ICD-10-CM

## 2023-06-07 DIAGNOSIS — E88819 Insulin resistance, unspecified: Secondary | ICD-10-CM | POA: Diagnosis not present

## 2023-06-07 DIAGNOSIS — Z6835 Body mass index (BMI) 35.0-35.9, adult: Secondary | ICD-10-CM | POA: Diagnosis not present

## 2023-06-07 MED ORDER — LEVOTHYROXINE SODIUM 112 MCG PO TABS
224.0000 ug | ORAL_TABLET | Freq: Every morning | ORAL | 0 refills | Status: DC
  Filled 2023-06-07: qty 60, 30d supply, fill #0

## 2023-06-07 NOTE — Progress Notes (Signed)
Carlye Grippe, D.O.  ABFM, ABOM Specializing in Clinical Bariatric Medicine  Office located at: [address] [place], [place]  [address]     Assessment and Plan:   Medications Discontinued During This Encounter  Medication Reason   levothyroxine (SYNTHROID) 112 MCG tablet Reorder     Meds ordered this encounter  Medications   levothyroxine (SYNTHROID) 112 MCG tablet    Sig: Take 2 tablets (224 mcg total) by mouth every morning.    Dispense:  60 tablet    Refill:  0     Insulin resistance Assessment: Goal is HgbA1c < 5.7, fasting insulin closer to 5. Medication: none. His hunger and cravings are well controlled when following the prudent nutritional plan. He endorses doing less snacking than before.   Lab Results  Component Value Date   HGBA1C 5.0 04/25/2023   Lab Results  Component Value Date   INSULIN 14.5 04/25/2023    Plan: He will continue to focus on protein-rich, low simple carbohydrate foods. Will continue to monitor condition closely alongside PCP.    Hypothyroidism, unspecified type Assessment: His hypothyroidism is being treated with Synthroid 112 mcg 2 tablets daily.   Lab Results  Component Value Date   TSH 0.124 (L) 04/25/2023   FREET4 2.33 (H) 04/25/2023    Plan: Continue with medication at current dose. Will refill today. Patient was instructed not to take MVM or iron within 4 hours of taking thyroid medications.   We will continue to monitor alongside Endocrinology/PCP as it relates to his weight loss journey. Will recheck thyroid panel July 10-20.    TREATMENT PLAN FOR OBESITY: Obesity (BMI 30-39.9) - start BMI 37.39 Assessment: Abdurrahman Petersheim is here to discuss his progress with his obesity treatment plan along with follow-up of his obesity related diagnoses. See Medical Weight Management Flowsheet for complete bioelectrical impedance results.  Condition is not optimized. Biometric data collected today, was reviewed with  patient.   Since last office visit on 05/24/23 patient's  Muscle mass has decreased by 0.4 lb. Fat mass has decreased by 0.4 lb. Total body water has increased by 3.2 lb.  Counseling done on how various foods will affect these numbers and how to maximize success  Total lbs lost to date: 14  Total weight loss percentage to date: 5.22    Plan: Continue the Category 2 meal plan with additional breakfast and lunch options and 6 ounces of lean protein at lunch and 8-10 ounces of lean protein at dinner.   - I again reviewed the Category 2 meal plan in detail with Harvie Heck since he was confused about certain aspects of the plan. Questions were encouraged and all of them were answered thoroughly.  Behavioral Intervention Additional resources provided today: category 2 meal plan information and breakfast options Evidence-based interventions for health behavior change were utilized today including the discussion of self monitoring techniques, problem-solving barriers and SMART goal setting techniques.   Regarding patient's less desirable eating habits and patterns, we employed the technique of small changes.  Pt will specifically work on: marking off foods as he eats them to avoid extra calories next visit.    Recommended Physical Activity Goals  Jasaiah has been advised to slowly work up to 150 minutes of moderate intensity aerobic activity a week and strengthening exercises 2-3 times per week for cardiovascular health, weight loss maintenance and preservation of muscle mass.   He has agreed to Continue current level of physical activity   FOLLOW UP: Return  in about 20 days (around 06/27/2023). He was informed of the importance of frequent follow up visits to maximize his success with intensive lifestyle modifications for his multiple health conditions.  Subjective:   Chief complaint: Obesity Linkon is here to discuss his progress with his obesity treatment plan. He is on the the Category 2 Plan with  additional lunch options and 6 ounces of lean protein at lunch and 8-10  ounces of lean protein at dinner states he is following his eating plan approximately 75% of the time. He states he is walking all day long 7 days per week.  Interval History:  Anothony Bursch is here for a follow up office visit. Since last OV, Dorris has been doing well. Pt endorses that he ate off plan for dinner on 4 occasions. Apart from this, he has been following the meal plan closely. Additionally, he states that his snacking has decreased significantly; however when he does snack, he consumes grapes. He is drinking 120 ounces of water daily.   Review of Systems:  Pertinent positives were addressed with patient today.  Reviewed by clinician on day of visit: allergies, medications, problem list, medical history, surgical history, family history, social history, and previous encounter notes.  Weight Summary and Biometrics   Weight Lost Since Last Visit: 0lb  Weight Gained Since Last Visit: 0lb    Vitals Temp: 97.8 F (36.6 C) BP: 127/72 Pulse Rate: (!) 56 SpO2: 98 %   Anthropometric Measurements Height: 5\' 11"  (1.803 m) Weight: 254 lb (115.2 kg) BMI (Calculated): 35.44 Weight at Last Visit: 254lb Weight Lost Since Last Visit: 0lb Weight Gained Since Last Visit: 0lb Starting Weight: 267lb Total Weight Loss (lbs): 13 lb (5.897 kg) Peak Weight: 315lb   Body Composition  Body Fat %: 36.4 % Fat Mass (lbs): 92.4 lbs Muscle Mass (lbs): 153.6 lbs Total Body Water (lbs): 116.4 lbs Visceral Fat Rating : 21   Other Clinical Data Fasting: no Labs: no Today's Visit #: 4 Starting Date: 04/25/23   Objective:   PHYSICAL EXAM: Blood pressure 127/72, pulse (!) 56, temperature 97.8 F (36.6 C), height 5\' 11"  (1.803 m), weight 254 lb (115.2 kg), SpO2 98 %. Body mass index is 35.43 kg/m.  General: Well Developed, well nourished, and in no acute distress.  HEENT: Normocephalic, atraumatic Skin:  Warm and dry, cap RF less 2 sec, good turgor Chest:  Normal excursion, shape, no gross abn Respiratory: speaking in full sentences, no conversational dyspnea NeuroM-Sk: Ambulates w/o assistance, moves * 4 Psych: A and O *3, insight good, mood-full  DIAGNOSTIC DATA REVIEWED:  BMET    Component Value Date/Time   NA 140 04/25/2023 0854   K 4.1 04/25/2023 0854   CL 101 04/25/2023 0854   CO2 26 04/25/2023 0854   GLUCOSE 95 04/25/2023 0854   GLUCOSE 91 08/23/2020 1723   BUN 11 04/25/2023 0854   CREATININE 0.72 (L) 04/25/2023 0854   CALCIUM 9.3 04/25/2023 0854   GFRNONAA >60 08/23/2020 1723   GFRAA >60 08/23/2020 1723   Lab Results  Component Value Date   HGBA1C 5.0 04/25/2023   HGBA1C 5.3 01/03/2010   Lab Results  Component Value Date   INSULIN 14.5 04/25/2023   Lab Results  Component Value Date   TSH 0.124 (L) 04/25/2023   CBC    Component Value Date/Time   WBC 5.3 04/25/2023 0854   WBC 5.7 08/23/2020 1723   RBC 4.84 04/25/2023 0854   RBC 4.54 08/23/2020 1723   HGB 14.5  04/25/2023 0854   HCT 42.2 04/25/2023 0854   PLT 219 04/25/2023 0854   MCV 87 04/25/2023 0854   MCH 30.0 04/25/2023 0854   MCH 29.3 08/23/2020 1723   MCHC 34.4 04/25/2023 0854   MCHC 34.6 08/23/2020 1723   RDW 12.2 04/25/2023 0854   Iron Studies No results found for: "IRON", "TIBC", "FERRITIN", "IRONPCTSAT" Lipid Panel     Component Value Date/Time   CHOL 227 (H) 04/25/2023 0854   TRIG 97 04/25/2023 0854   HDL 55 04/25/2023 0854   CHOLHDL 4.3 07/13/2022 0807   CHOLHDL 3.2 11/03/2014 0800   VLDL 19 11/03/2014 0800   LDLCALC 155 (H) 04/25/2023 0854   Hepatic Function Panel     Component Value Date/Time   PROT 6.8 04/25/2023 0854   ALBUMIN 4.5 04/25/2023 0854   AST 14 04/25/2023 0854   ALT 20 04/25/2023 0854   ALKPHOS 79 04/25/2023 0854   BILITOT 0.5 04/25/2023 0854   BILIDIR 0.12 01/14/2021 0847   IBILI 0.4 11/03/2014 0800      Component Value Date/Time   TSH 0.124 (L)  04/25/2023 0854   Nutritional Lab Results  Component Value Date   VD25OH 59.1 04/25/2023   VD25OH 37 01/03/2010    Attestations:   Patient was in the office today and time spent on visit including pre-visit chart review and post-visit care/coordination of care and electronic medical record documentation was 35 minutes. 50% of the time was in face to face counseling of this patient's medical condition(s) and providing education on treatment options to include the first-line treatment of diet and lifestyle modification.   I, Special Randolm Idol, acting as a Stage manager for [employer], DO., have compiled all relevant documentation for today's office visit on behalf of Thomasene Lot, DO, while in the presence of [employer], DO.  I have reviewed the above documentation for accuracy and completeness, and I agree with the above. Carlye Grippe, D.O.  The 21st Century Cures Act was signed into law in 2016 which includes the topic of electronic health records.  This provides immediate access to information in MyChart.  This includes consultation notes, operative notes, office notes, lab results and pathology reports.  If you have any questions about what you read please let us know at your next visit so we can discuss your concerns and take corrective action if need be.  We are right here with you.

## 2023-06-11 ENCOUNTER — Other Ambulatory Visit (HOSPITAL_BASED_OUTPATIENT_CLINIC_OR_DEPARTMENT_OTHER): Payer: Self-pay

## 2023-06-12 ENCOUNTER — Encounter: Payer: Self-pay | Admitting: Cardiovascular Disease

## 2023-06-12 ENCOUNTER — Telehealth: Payer: Self-pay

## 2023-06-12 ENCOUNTER — Telehealth: Payer: Self-pay | Admitting: Pharmacist Clinician (PhC)/ Clinical Pharmacy Specialist

## 2023-06-12 ENCOUNTER — Ambulatory Visit
Payer: No Typology Code available for payment source | Attending: Cardiovascular Disease | Admitting: Cardiovascular Disease

## 2023-06-12 ENCOUNTER — Other Ambulatory Visit (HOSPITAL_COMMUNITY): Payer: Self-pay

## 2023-06-12 VITALS — BP 98/64 | HR 58 | Ht 71.0 in | Wt 254.4 lb

## 2023-06-12 DIAGNOSIS — I10 Essential (primary) hypertension: Secondary | ICD-10-CM

## 2023-06-12 DIAGNOSIS — G4733 Obstructive sleep apnea (adult) (pediatric): Secondary | ICD-10-CM

## 2023-06-12 DIAGNOSIS — E782 Mixed hyperlipidemia: Secondary | ICD-10-CM | POA: Diagnosis not present

## 2023-06-12 DIAGNOSIS — I712 Thoracic aortic aneurysm, without rupture, unspecified: Secondary | ICD-10-CM | POA: Insufficient documentation

## 2023-06-12 DIAGNOSIS — R931 Abnormal findings on diagnostic imaging of heart and coronary circulation: Secondary | ICD-10-CM | POA: Insufficient documentation

## 2023-06-12 DIAGNOSIS — I7121 Aneurysm of the ascending aorta, without rupture: Secondary | ICD-10-CM

## 2023-06-12 DIAGNOSIS — E785 Hyperlipidemia, unspecified: Secondary | ICD-10-CM | POA: Insufficient documentation

## 2023-06-12 NOTE — Assessment & Plan Note (Signed)
History of obstructive sleep apnea on CPAP followed by Dr. Tresa Endo.  He benefits from this clinically.

## 2023-06-12 NOTE — Patient Instructions (Addendum)
Medication Instructions:  Your physician recommends that you continue on your current medications as directed. Please refer to the Current Medication list given to you today.  *If you need a refill on your cardiac medications before your next appointment, please call your pharmacy*   Testing/Procedures: Your physician has requested that you have an echocardiogram. Echocardiography is a painless test that uses sound waves to create images of your heart. It provides your doctor with information about the size and shape of your heart and how well your heart's chambers and valves are working. This procedure takes approximately one hour. There are no restrictions for this procedure. Please do NOT wear cologne, perfume, aftershave, or lotions (deodorant is allowed). Please arrive 15 minutes prior to your appointment time. This will take place at [address]    Follow-Up: At [facility], you and your health needs are our priority.  As part of our continuing mission to provide you with exceptional heart care, we have created designated Provider Care Teams.  These Care Teams include your primary Cardiologist (physician) and Advanced Practice Providers (APPs -  Physician Assistants and Nurse Practitioners) who all work together to provide you with the care you need, when you need it.  We recommend signing up for the patient portal called "MyChart".  Sign up information is provided on this After Visit Summary.  MyChart is used to connect with patients for Virtual Visits (Telemedicine).  Patients are able to view lab/test results, encounter notes, upcoming appointments, etc.  Non-urgent messages can be sent to your provider as well.   To learn more about what you can do with MyChart, go to [email or web address].    Your next appointment:   12 month(s)  Provider:   Jonathan Berry, MD    

## 2023-06-12 NOTE — Telephone Encounter (Signed)
    NO P/A is req'd for repatha via the pts [place] plan

## 2023-06-12 NOTE — Telephone Encounter (Signed)
Pt in office with Dr. Allyson Sabal today - would like to start PCSK-9,  Cac[place] score 319  (87th percentile)  Primary insurance is [place], secondary is [place]

## 2023-06-12 NOTE — Assessment & Plan Note (Signed)
History of essential hypertension blood pressure measured today at 98/64.  He is on losartan and HydroDIURIL.  If at the next appointment his blood pressure is similarly low we may think about backing off some of his losartan given his weight loss.

## 2023-06-12 NOTE — Assessment & Plan Note (Signed)
History of hyperlipidemia intolerant to statin therapy with lipid profile performed 04/25/2023 revealing total cholesterol 227, LDL 155 and HDL of 55.  Given his elevated coronary calcium score his LDL goal is less than 70.  Will discuss starting a PCSK9.

## 2023-06-12 NOTE — Assessment & Plan Note (Signed)
Coronary calcium score performed 09/06/2021 was 319 distributed through all 3 coronary arteries.  He is active and completely asymptomatic.  Based on this his LDL goal is less than 70.

## 2023-06-12 NOTE — Assessment & Plan Note (Signed)
Thoracic aorta measured at the time of his coronary calcium score 09/06/2021 was 42 mm.  Will check a 2D echo to continue to follow this noninvasively.

## 2023-06-12 NOTE — Progress Notes (Signed)
06/12/2023 Samuel Goodwin   01-12-1961  [id number]  Primary Physician de [place], Raymond J, MD Primary Cardiologist: Runell Gess MD Milagros Loll, [place], [place]  HPI:  Samuel Goodwin is a 62 y.o.   moderately overweight, married Caucasian male, father of 3 who I last saw 08/05/2021.  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  Since I saw him last he has changed jobs and currently is working only in [place].  He has a strong family history for heart disease along with hypertension and hyperlipidemia. He has obstructive sleep apnea on CPAP which he benefits from.[name] He is trying to lose weight with marginal success. 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 He also has had throat cancer surgically addressed at [facility]. He was complaining of atypical chest pain  in the past and  had a negative Myoview stress test performed 08/29/2016.   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  He was unable to exercise unfortunately has gained weight as a result of that.  He also needs a right total knee replacement scheduled to be performed after the first the year by Dr. Charlann Boxer .  He did recently take a statin holiday at the recommendation of his PCP which resulted in marked improvement in his back and thigh symptoms.   Since I saw him in the office 2 years ago he continues to do well.  He is going to the diet wellness center and has lost 50 pounds since January.  He is altered his diet.  He is exercising 5 days a week.  He did have a coronary calcium score performed 09/06/2021 which was 319 distributed through all 3 coronary arteries.  His ascending aorta measured 42 mm.  He is completely asymptomatic.  He does see Dr. Tresa Endo for obstructive sleep apnea and is wearing CPAP.   Current Meds  Medication Sig    ACAI [name] PO Take by mouth.   amoxicillin (AMOXIL) 500 MG capsule Take 4 capsules (2,000 mg total) by mouth 1 hour prior to appointment.   Ascorbic Acid (VITAMIN C) 1000 MG tablet Take 1,000 mg by mouth daily.   aspirin 81 MG chewable tablet    b complex vitamins tablet Take 2 tablets by mouth daily.    BEE POLLEN PO Take by mouth.   carboxymethylcellulose (REFRESH PLUS) 0.5 % SOLN INSTILL 1 DROP IN BOTH EYES FOUR TIMES A DAY   celecoxib (CELEBREX) 200 MG capsule Take 1 capsule by mouth daily as needed.   cyanocobalamin (VITAMIN B12) 500 MCG tablet 300- 500 mcg daily   cyclobenzaprine (FLEXERIL) 10 MG tablet Take 1 tablet 3 times a day by oral route as needed.   diclofenac Sodium (VOLTAREN) 1 % GEL APPLY TO KNEE AND HIPS AS NEEDED   fluticasone (FLONASE) 50 MCG/ACT nasal spray Place 2 spray into both nostrils once daily. (Patient taking differently: Place 1 spray into both nostrils as needed.)   hydrochlorothiazide (HYDRODIURIL) 25 MG tablet Take 1 tablet (25 mg total) by mouth daily.   levothyroxine (SYNTHROID) 112 MCG tablet Take 2 tablets (224 mcg total) by mouth every morning.   losartan (COZAAR) 100 MG tablet TAKE ONE-HALF TABLET BY MOUTH ONCE A DAY   omeprazole (PRILOSEC) 40 MG capsule Take 1 capsule by mouth 2 (two)  times daily.   praziquantel (BILTRICIDE) 600 MG tablet Take one tablet by mouth three times daily for 1 day, repeat dose in 2-4 weeks (Patient taking differently: Take 600 mg by mouth. 2 time a year)   Turmeric (QC TUMERIC COMPLEX PO) Take by mouth.   Vitamin D, Cholecalciferol, 25 MCG (1000 UT) CAPS 5,000 IU  daily     Allergies  Allergen Reactions   Dilaudid [Hydromorphone Hcl] Nausea And Vomiting    "Questionable as to whether it was actually dilaudid  That made me so severely nauseous and vomit"   Lipitor [Atorvastatin]     myalgia   Pitavastatin     Myalgia    Simvastatin     myalgia   Benazepril Hcl Cough    Social History   Socioeconomic History    Marital status: Married    Spouse name: [name]   Number of children: 3   Years of education: 12   Highest education level: Associate degree: occupational, Scientist, product/process development, or vocational program  Occupational History   Not on file  Tobacco Use   Smoking status: Never   Smokeless tobacco: Never   Tobacco comments:    Quit at age 35  Vaping Use   Vaping Use: Never used  Substance and Sexual Activity   Alcohol use: Not Currently    Comment: alcoholism quit 30 years ago    Drug use: Not Currently   Sexual activity: Yes  Other Topics Concern   Not on file  Social History Narrative   Lives with wife   No caffeine   Social Determinants of Health   Financial Resource Strain: Not on file  Food Insecurity: Not on file  Transportation Needs: Not on file  Physical Activity: Not on file  Stress: Not on file  Social Connections: Not on file  Intimate Partner Violence: Not on file     Review of Systems: General: negative for chills, fever, night sweats or weight changes.  Cardiovascular: negative for chest pain, dyspnea on exertion, edema, orthopnea, palpitations, paroxysmal nocturnal dyspnea or shortness of breath Dermatological: negative for rash Respiratory: negative for cough or wheezing Urologic: negative for hematuria Abdominal: negative for nausea, vomiting, diarrhea, bright red blood per rectum, melena, or hematemesis Neurologic: negative for visual changes, syncope, or dizziness All other systems reviewed and are otherwise negative except as noted above.    Blood pressure 98/64, pulse (!) 58, height 5\' 11"  (1.803 m), weight 254 lb 6.4 oz (115.4 kg), SpO2 96 %.  General appearance: alert and no distress Neck: no adenopathy, no carotid bruit, no JVD, supple, symmetrical, trachea midline, and thyroid not enlarged, symmetric, no tenderness/mass/nodules Lungs: clear to auscultation bilaterally Heart: regular rate and rhythm, S1, S2 normal, no murmur, click, rub or  gallop Extremities: extremities normal, atraumatic, no cyanosis or edema Pulses: 2+ and symmetric Skin: Skin color, texture, turgor normal. No rashes or lesions Neurologic: Grossly normal  EKG not performed today      ASSESSMENT AND PLAN:   HTN (hypertension) History of essential hypertension blood pressure measured today at 98/64.  He is on losartan and HydroDIURIL.  If at the next appointment his blood pressure is similarly low we may think about backing off some of his losartan given his weight loss.  Sleep apnea History of obstructive sleep apnea on CPAP followed by Dr. Tresa Endo.  He benefits from this clinically.  Hyperlipidemia History of hyperlipidemia intolerant to statin therapy with lipid profile performed 04/25/2023 revealing total cholesterol 227, LDL 155 and HDL of  55.  Given his elevated coronary calcium score his LDL goal is less than 70.  Will discuss starting a PCSK9.  Elevated coronary artery calcium score Coronary calcium score performed 09/06/2021 was 319 distributed through all 3 coronary arteries.  He is active and completely asymptomatic.  Based on this his LDL goal is less than 70.  Thoracic aortic aneurysm [facility]) Thoracic aorta measured at the time of his coronary calcium score 09/06/2021 was 42 mm.  Will check a 2D echo to continue to follow this noninvasively.     Runell Gess MD FACP,FACC,FAHA, [facility] 06/12/2023 8:40 AM

## 2023-06-13 ENCOUNTER — Telehealth: Payer: Self-pay | Admitting: *Deleted

## 2023-06-13 DIAGNOSIS — I10 Essential (primary) hypertension: Secondary | ICD-10-CM

## 2023-06-13 DIAGNOSIS — G4733 Obstructive sleep apnea (adult) (pediatric): Secondary | ICD-10-CM

## 2023-06-13 NOTE — Telephone Encounter (Signed)
Per Dr Tresa Endo, Order placed for a ResMed air curve 11 V auto unit. pressure set at EPAP min of 15, pressure support of 4, and IPAP max of 25. Order faxed to [facility] by Brandie.

## 2023-06-13 NOTE — Telephone Encounter (Signed)
Reached out to the patient and he states things have been handled.

## 2023-06-18 NOTE — Telephone Encounter (Signed)
See other encounter.

## 2023-06-18 NOTE — Telephone Encounter (Signed)
Spoke with patient.  He will reach out to [place] for coverage information then let me know later in the week where he wants prescription sent ([facility] or local with [facility])

## 2023-06-22 ENCOUNTER — Telehealth: Payer: Self-pay | Admitting: Cardiovascular Disease

## 2023-06-22 NOTE — Telephone Encounter (Signed)
Left message for patient, aware will forward to our billing department to help with this.

## 2023-06-22 NOTE — Telephone Encounter (Signed)
Pt is requesting a callback regarding his VA authorization being resubmitted to be extended for MD Allyson Sabal and MD Tresa Endo. Please advise

## 2023-06-26 ENCOUNTER — Telehealth (HOSPITAL_BASED_OUTPATIENT_CLINIC_OR_DEPARTMENT_OTHER): Payer: Self-pay | Admitting: *Deleted

## 2023-06-26 NOTE — Telephone Encounter (Signed)
Called to offer colon cancer screening. Pt advised on cologuard kit and colonoscopy options. Pt declines at this time but will call back to [facility] if he changes his mind.

## 2023-06-27 ENCOUNTER — Other Ambulatory Visit (HOSPITAL_BASED_OUTPATIENT_CLINIC_OR_DEPARTMENT_OTHER): Payer: Self-pay

## 2023-06-27 ENCOUNTER — Ambulatory Visit (INDEPENDENT_AMBULATORY_CARE_PROVIDER_SITE_OTHER): Payer: No Typology Code available for payment source | Admitting: Family Medicine

## 2023-06-27 ENCOUNTER — Encounter (INDEPENDENT_AMBULATORY_CARE_PROVIDER_SITE_OTHER): Payer: Self-pay | Admitting: Family Medicine

## 2023-06-27 VITALS — BP 135/84 | HR 51 | Temp 97.8°F | Ht 71.0 in | Wt 247.0 lb

## 2023-06-27 DIAGNOSIS — E782 Mixed hyperlipidemia: Secondary | ICD-10-CM

## 2023-06-27 DIAGNOSIS — Z6834 Body mass index (BMI) 34.0-34.9, adult: Secondary | ICD-10-CM

## 2023-06-27 DIAGNOSIS — E669 Obesity, unspecified: Secondary | ICD-10-CM

## 2023-06-27 DIAGNOSIS — E538 Deficiency of other specified B group vitamins: Secondary | ICD-10-CM

## 2023-06-27 DIAGNOSIS — E039 Hypothyroidism, unspecified: Secondary | ICD-10-CM | POA: Diagnosis not present

## 2023-06-27 DIAGNOSIS — E559 Vitamin D deficiency, unspecified: Secondary | ICD-10-CM | POA: Diagnosis not present

## 2023-06-27 DIAGNOSIS — E88819 Insulin resistance, unspecified: Secondary | ICD-10-CM

## 2023-06-27 DIAGNOSIS — Z6835 Body mass index (BMI) 35.0-35.9, adult: Secondary | ICD-10-CM

## 2023-06-27 MED ORDER — LEVOTHYROXINE SODIUM 112 MCG PO TABS
224.0000 ug | ORAL_TABLET | Freq: Every morning | ORAL | 0 refills | Status: AC
Start: 2023-06-27 — End: ?
  Filled 2023-06-27: qty 60, 30d supply, fill #0

## 2023-06-27 NOTE — Progress Notes (Signed)
Carlye Grippe, D.O.  ABFM, ABOM Specializing in Clinical Bariatric Medicine  Office located at: [address] [place], [place]  [address]     Assessment and Plan:   Orders Placed This Encounter  Procedures   T4, free   TSH   T3   Medications Discontinued During This Encounter  Medication Reason   levothyroxine (SYNTHROID) 112 MCG tablet Reorder    Meds ordered this encounter  Medications   levothyroxine (SYNTHROID) 112 MCG tablet    Sig: Take 2 tablets (224 mcg total) by mouth every morning.    Dispense:  60 tablet    Refill:  0    Hypothyroidism, unspecified type Assessment: Condition is Not optimized.. Continues Synthroid with no adverse side effects. He tolerate this medication well. I decreased his Synthroid dose on 05/09/2023 due to a low TSH and high Free T4.  Lab Results  Component Value Date   TSH 0.124 (L) 04/25/2023   T3TOTAL 116 04/25/2023   Plan: Recheck labs. Continue Synthroid 122 twice daily as directed by Dr. Val Eagle. Will refill today. Landan was reminded not to take MVM or any iron within 4 hours of taking his thyroid medications. Continue his prudent nutritional plan that is low in simple carbohydrates, saturated fats and trans fats to goal of 5-10% weight loss to achieve significant health benefits.  Pt encouraged to continually advance exercise and cardiovascular fitness as tolerated throughout weight loss journey.   Insulin resistance Controlled. Goal is HgbA1c < 5.7, fasting insulin closer to 5. This is diet/exercise controlled.  Lab Results  Component Value Date   HGBA1C 5.0 04/25/2023   Lab Results  Component Value Date   INSULIN 14.5 04/25/2023   Plan:  Recheck labs. - Continue to decrease simple carbs/ sugars; increase fiber and proteins -> follow his meal plan.  - Anticipatory guidance was given. Jakevion will continue to work on weight loss, exercise, via their meal plan we devised to help decrease the risk of progressing to diabetes. We  will recheck A1c and fasting insulin level in approximately 3 months from last check, or as deemed appropriate.    Vitamin B12 deficiency Assessment: Condition is Not at goal. His B12 is at 421 almost at the recommended range of 500. He continues OTC B12 supplement.  Lab Results  Component Value Date   VITAMINB12 421 04/25/2023   Plan: Continue cyanocobalamin daily as directed by Dr. Val Eagle. We will continue to monitor his condition and adjust treatment as deemed clinically necessary.    Vitamin D deficiency Assessment: Condition is Controlled. His vitamin D level is within the recommended range of 50-80. He continues OTC oral vitamin D supplement without difficulty daily and is tolerating it well without any adverse side effects.  Lab Results  Component Value Date   VD25OH 59.1 04/25/2023   VD25OH 37 01/03/2010   Plan: Continue Cholecalciferol 1000lU daily as directed by Dr. Val Eagle.  - weight loss will likely improve availability of vitamin D, thus encouraged Kamali to continue with meal plan and their weight loss efforts to further improve this condition.  Thus, we will need to monitor levels regularly (every 3-4 mo on average) to keep levels within normal limits and prevent over supplementation.   Mixed hyperlipidemia Assessment: Condition is Not at goal. His cholesterol is elevated at 227 and his LDL is elevated at 155. He wanted to discuss his cholesterol levels and medication although this is handled through his cardiologist.  Lab Results  Component Value Date  CHOL 227 (H) 04/25/2023   HDL 55 04/25/2023   LDLCALC 155 (H) 04/25/2023   TRIG 97 04/25/2023   CHOLHDL 4.3 07/13/2022   Plan: Recheck labs. Pt was advised to see his cardiologist for further evaluation of his cholesterol levels and medication changes as necessary.  - I stressed the importance that patient continue with our prudent nutritional plan that is low in saturated and trans fats, and low in fatty carbs to improve  these numbers.  - We recommend: aerobic activity with eventual goal of a minimum of 150+ min wk plus 2 days/ week of resistance or strength training.      TREATMENT PLAN FOR OBESITY: BMI 35.0-35.9,adult- current BMI 35.46 Obesity (BMI 30-39.9) - start BMI 37.39 Assessment:  Markez Dowland is here to discuss his progress with his obesity treatment plan along with follow-up of his obesity related diagnoses. See Medical Weight Management Flowsheet for complete bioelectrical impedance results.  Condition is docourse: improving but not optimized. Biometric data collected today, was reviewed with patient.   Since last office visit on 06/07/2023 patient's  Muscle mass has decreased by 1.4lb. Fat mass has decreased by 5.4lb. Total body water has decreased by 4.4lb.  Counseling done on how various foods will affect these numbers and how to maximize success  Total lbs lost to date: 20lbs Total weight loss percentage to date: 7.49%  Plan: Continue the Category 2 meal plan with B and L option. Additional 6 ounces of lean protein at lunch and 8-10 ounces of lean protein at dinner.   Behavioral Intervention Additional resources provided today: Recipes Evidence-based interventions for health behavior change were utilized today including the discussion of self monitoring techniques, problem-solving barriers and SMART goal setting techniques.   Regarding patient's less desirable eating habits and patterns, we employed the technique of small changes.  Pt will specifically work on: measuring his protein and calorie intake for next visit.    Recommended Physical Activity Goals  Armoni has been advised to slowly work up to 150 minutes of moderate intensity aerobic activity a week and strengthening exercises 2-3 times per week for cardiovascular health, weight loss maintenance and preservation of muscle mass.   He has agreed to Continue current level of physical activity   FOLLOW UP: No follow-ups on  file. He was informed of the importance of frequent follow up visits to maximize his success with intensive lifestyle modifications for his multiple health conditions. Wildon Cuevas is aware that we will review all of his lab results at our next visit.  He is aware that if anything is critical/ life threatening with the results, we will be contacting him via MyChart prior to the office visit to discuss management.   Subjective:   Chief complaint: Obesity Dodger is here to discuss his progress with his obesity treatment plan. He is on the the Category 2 Plan with B and L options and states he is following his eating plan approximately 75-80%% of the time. He states he is walking 3 days a week and swimming 45-5mins 3 days a week.   Interval History:  Amiir Heckard is here for a follow up office visit. Since last OV Ji has improved on measuring his meals. He continues to measure his protein and is meeting his protein goals better but consistently. He is having a hard time meeting his snack calories without going over. He states that he doesn't love low/no sugar salad dressings but does love the laughing cow cheese wedges.  He notes that since working out he has less anxiety and heart palpitations but does have more energy and is calmer.   Review of Systems:  Pertinent positives were addressed with patient today.  Reviewed by clinician on day of visit: allergies, medications, problem list, medical history, surgical history, family history, social history, and previous encounter notes.  Weight Summary and Biometrics   Weight Lost Since Last Visit: 7lb  Weight Gained Since Last Visit: 0lb   Vitals Temp: 97.8 F (36.6 C) BP: 135/84 Pulse Rate: (!) 51 SpO2: 100 %   Anthropometric Measurements Height: 5\' 11"  (1.803 m) Weight: 247 lb (112 kg) BMI (Calculated): 34.46 Weight at Last Visit: 254lb Weight Lost Since Last Visit: 7lb Weight Gained Since Last Visit:  0lb Starting Weight: 267lb Total Weight Loss (lbs): 20 lb (9.072 kg) Peak Weight: 315lb   Body Composition  Body Fat %: 35.2 % Fat Mass (lbs): 87 lbs Muscle Mass (lbs): 152.2 lbs Total Body Water (lbs): 112 lbs Visceral Fat Rating : 20   Other Clinical Data Fasting: yes Labs: yes Today's Visit #: 5 Starting Date: 04/25/23   Objective:   PHYSICAL EXAM: Blood pressure 135/84, pulse (!) 51, temperature 97.8 F (36.6 C), height 5\' 11"  (1.803 m), weight 247 lb (112 kg), SpO2 100%. Body mass index is 34.45 kg/m.  General: Well Developed, well nourished, and in no acute distress.  HEENT: Normocephalic, atraumatic Skin: Warm and dry, cap RF less 2 sec, good turgor Chest:  Normal excursion, shape, no gross abn Respiratory: speaking in full sentences, no conversational dyspnea NeuroM-Sk: Ambulates w/o assistance, moves * 4 Psych: A and O *3, insight good, mood-full  DIAGNOSTIC DATA REVIEWED:  BMET    Component Value Date/Time   NA 140 04/25/2023 0854   K 4.1 04/25/2023 0854   CL 101 04/25/2023 0854   CO2 26 04/25/2023 0854   GLUCOSE 95 04/25/2023 0854   GLUCOSE 91 08/23/2020 1723   BUN 11 04/25/2023 0854   CREATININE 0.72 (L) 04/25/2023 0854   CALCIUM 9.3 04/25/2023 0854   GFRNONAA >60 08/23/2020 1723   GFRAA >60 08/23/2020 1723   Lab Results  Component Value Date   HGBA1C 5.0 04/25/2023   HGBA1C 5.3 01/03/2010   Lab Results  Component Value Date   INSULIN 14.5 04/25/2023   Lab Results  Component Value Date   TSH 0.124 (L) 04/25/2023   CBC    Component Value Date/Time   WBC 5.3 04/25/2023 0854   WBC 5.7 08/23/2020 1723   RBC 4.84 04/25/2023 0854   RBC 4.54 08/23/2020 1723   HGB 14.5 04/25/2023 0854   HCT 42.2 04/25/2023 0854   PLT 219 04/25/2023 0854   MCV 87 04/25/2023 0854   MCH 30.0 04/25/2023 0854   MCH 29.3 08/23/2020 1723   MCHC 34.4 04/25/2023 0854   MCHC 34.6 08/23/2020 1723   RDW 12.2 04/25/2023 0854   Iron Studies No results found  for: "IRON", "TIBC", "FERRITIN", "IRONPCTSAT" Lipid Panel     Component Value Date/Time   CHOL 227 (H) 04/25/2023 0854   TRIG 97 04/25/2023 0854   HDL 55 04/25/2023 0854   CHOLHDL 4.3 07/13/2022 0807   CHOLHDL 3.2 11/03/2014 0800   VLDL 19 11/03/2014 0800   LDLCALC 155 (H) 04/25/2023 0854   Hepatic Function Panel     Component Value Date/Time   PROT 6.8 04/25/2023 0854   ALBUMIN 4.5 04/25/2023 0854   AST 14 04/25/2023 0854   ALT 20 04/25/2023 0854   ALKPHOS  79 04/25/2023 0854   BILITOT 0.5 04/25/2023 0854   BILIDIR 0.12 01/14/2021 0847   IBILI 0.4 11/03/2014 0800      Component Value Date/Time   TSH 0.124 (L) 04/25/2023 0854   Nutritional Lab Results  Component Value Date   VD25OH 59.1 04/25/2023   VD25OH 37 01/03/2010    Attestations:   This encounter took 40 total minutes of time including any pre-visit and post-visit time spent on this date of service, including taking a thorough history, reviewing any labs and/or imaging, reviewing prior notes, as well as documenting in the electronic health record on the date of service. Over 50% of that time was in direct face-to-face counseling and coordinating care for the patient today  I, Clinical biochemist, acting as a Stage manager for [employer], DO., have compiled all relevant documentation for today's office visit on behalf of Thomasene Lot, DO, while in the presence of [employer], DO.  I have reviewed the above documentation for accuracy and completeness, and I agree with the above. Carlye Grippe, D.O.  The 21st Century Cures Act was signed into law in 2016 which includes the topic of electronic health records.  This provides immediate access to information in MyChart.  This includes consultation notes, operative notes, office notes, lab results and pathology reports.  If you have any questions about what you read please let us know at your next visit so we can discuss your concerns and take corrective action  if need be.  We are right here with you.

## 2023-06-28 LAB — TSH: TSH: 0.461 u[IU]/mL (ref 0.450–4.500)

## 2023-06-28 LAB — COMPREHENSIVE METABOLIC PANEL
ALT: 16 IU/L (ref 0–44)
AST: 12 IU/L (ref 0–40)
Albumin: 4.3 g/dL (ref 3.9–4.9)
Alkaline Phosphatase: 62 IU/L (ref 44–121)
BUN/Creatinine Ratio: 27 — ABNORMAL HIGH (ref 10–24)
BUN: 20 mg/dL (ref 8–27)
Bilirubin Total: 0.7 mg/dL (ref 0.0–1.2)
CO2: 23 mmol/L (ref 20–29)
Calcium: 9.3 mg/dL (ref 8.6–10.2)
Chloride: 100 mmol/L (ref 96–106)
Creatinine, Ser: 0.75 mg/dL — ABNORMAL LOW (ref 0.76–1.27)
Globulin, Total: 2.2 g/dL (ref 1.5–4.5)
Glucose: 86 mg/dL (ref 70–99)
Potassium: 3.7 mmol/L (ref 3.5–5.2)
Sodium: 138 mmol/L (ref 134–144)
Total Protein: 6.5 g/dL (ref 6.0–8.5)
eGFR: 103 mL/min/{1.73_m2} (ref >59)

## 2023-06-28 LAB — LIPID PANEL
Chol/HDL Ratio: 4.1 ratio (ref 0.0–5.0)
Cholesterol, Total: 215 mg/dL — ABNORMAL HIGH (ref 100–199)
HDL: 52 mg/dL (ref >39)
LDL Chol Calc (NIH): 145 mg/dL — ABNORMAL HIGH (ref 0–99)
Triglycerides: 102 mg/dL (ref 0–149)
VLDL Cholesterol Cal: 18 mg/dL (ref 5–40)

## 2023-06-28 LAB — T4, FREE: Free T4: 2.29 ng/dL — ABNORMAL HIGH (ref 0.82–1.77)

## 2023-06-28 LAB — T3: T3, Total: 73 ng/dL (ref 71–180)

## 2023-07-02 ENCOUNTER — Telehealth: Payer: Self-pay | Admitting: Pharmacist

## 2023-07-02 DIAGNOSIS — E782 Mixed hyperlipidemia: Secondary | ICD-10-CM

## 2023-07-02 MED ORDER — PRALUENT 75 MG/ML ~~LOC~~ SOAJ: 75.0000 mg | SUBCUTANEOUS | 2 refills | Status: AC

## 2023-07-02 NOTE — Telephone Encounter (Signed)
Patient called, states he received approval for Praluent from his [place]. Requesting to send prescription. Prescription sent to [facility] pharmacy.

## 2023-07-04 ENCOUNTER — Telehealth: Payer: Self-pay | Admitting: Cardiovascular Disease

## 2023-07-04 NOTE — Telephone Encounter (Signed)
Pt is requesting a call back from [place] regarding a cpap machine order that the [place] has been waiting on since Feb. He stated she gave him her number and he's been leaving messages but hasn't been called back. Please advise

## 2023-07-09 ENCOUNTER — Ambulatory Visit (HOSPITAL_COMMUNITY): Payer: No Typology Code available for payment source | Attending: Cardiovascular Disease

## 2023-07-09 DIAGNOSIS — G4733 Obstructive sleep apnea (adult) (pediatric): Secondary | ICD-10-CM | POA: Diagnosis not present

## 2023-07-09 DIAGNOSIS — R931 Abnormal findings on diagnostic imaging of heart and coronary circulation: Secondary | ICD-10-CM | POA: Diagnosis not present

## 2023-07-09 DIAGNOSIS — I7121 Aneurysm of the ascending aorta, without rupture: Secondary | ICD-10-CM | POA: Diagnosis present

## 2023-07-09 DIAGNOSIS — I10 Essential (primary) hypertension: Secondary | ICD-10-CM | POA: Insufficient documentation

## 2023-07-09 DIAGNOSIS — E782 Mixed hyperlipidemia: Secondary | ICD-10-CM | POA: Diagnosis not present

## 2023-07-09 LAB — ECHOCARDIOGRAM COMPLETE
Area-P 1/2: 2.79 cm2
S' Lateral: 2.8 cm

## 2023-07-11 ENCOUNTER — Ambulatory Visit (INDEPENDENT_AMBULATORY_CARE_PROVIDER_SITE_OTHER): Payer: No Typology Code available for payment source | Admitting: Family Medicine

## 2023-07-11 ENCOUNTER — Other Ambulatory Visit (HOSPITAL_BASED_OUTPATIENT_CLINIC_OR_DEPARTMENT_OTHER): Payer: Self-pay

## 2023-07-11 ENCOUNTER — Other Ambulatory Visit (INDEPENDENT_AMBULATORY_CARE_PROVIDER_SITE_OTHER): Payer: Self-pay | Admitting: Family Medicine

## 2023-07-11 DIAGNOSIS — E039 Hypothyroidism, unspecified: Secondary | ICD-10-CM

## 2023-07-12 ENCOUNTER — Ambulatory Visit (INDEPENDENT_AMBULATORY_CARE_PROVIDER_SITE_OTHER): Payer: No Typology Code available for payment source | Admitting: Family Medicine

## 2023-07-12 ENCOUNTER — Telehealth: Payer: Self-pay

## 2023-07-12 ENCOUNTER — Encounter (INDEPENDENT_AMBULATORY_CARE_PROVIDER_SITE_OTHER): Payer: Self-pay | Admitting: Family Medicine

## 2023-07-12 ENCOUNTER — Other Ambulatory Visit (HOSPITAL_BASED_OUTPATIENT_CLINIC_OR_DEPARTMENT_OTHER): Payer: Self-pay

## 2023-07-12 VITALS — BP 129/82 | HR 55 | Temp 97.9°F | Ht 71.0 in | Wt 246.0 lb

## 2023-07-12 DIAGNOSIS — E669 Obesity, unspecified: Secondary | ICD-10-CM | POA: Diagnosis not present

## 2023-07-12 DIAGNOSIS — E782 Mixed hyperlipidemia: Secondary | ICD-10-CM

## 2023-07-12 DIAGNOSIS — Z6835 Body mass index (BMI) 35.0-35.9, adult: Secondary | ICD-10-CM

## 2023-07-12 DIAGNOSIS — E039 Hypothyroidism, unspecified: Secondary | ICD-10-CM

## 2023-07-12 DIAGNOSIS — E88819 Insulin resistance, unspecified: Secondary | ICD-10-CM

## 2023-07-12 NOTE — Progress Notes (Signed)
Samuel Goodwin, D.O.  ABFM, ABOM Specializing in Clinical Bariatric Medicine  Office located at: [address] [place], [place]  [address]     Assessment and Plan:   Insulin resistance Assessment & Plan:  Condition is diet/exercise controlled. Reports that hunger and cravings are pretty well controlled. BUN/Creatinine ratio suggests that pt is dehydrated - pt reports feeling more muscle cramps. Liver enzymes are within normal limits.   Lab Results  Component Value Date   HGBA1C 5.0 04/25/2023   HGBA1C 5.2 07/13/2022   HGBA1C 5.1 12/21/2021   INSULIN 14.5 04/25/2023    Lab Results  Component Value Date   CREATININE 0.75 (L) 06/27/2023   BUN 20 06/27/2023   NA 138 06/27/2023   K 3.7 06/27/2023   CL 100 06/27/2023   CO2 23 06/27/2023      Component Value Date/Time   PROT 6.5 06/27/2023 1128   ALBUMIN 4.3 06/27/2023 1128   AST 12 06/27/2023 1128   ALT 16 06/27/2023 1128   ALKPHOS 62 06/27/2023 1128   BILITOT 0.7 06/27/2023 1128   BILIDIR 0.12 01/14/2021 0847   IBILI 0.4 11/03/2014 0800     Labs were reviewed with patient today and education provided on them..  All of the patient's questions about them were answered.   Unless pre-existing renal or cardiopulmonary conditions exist which patient was told to limit their fluid intake by another provider, I recommended roughly one half of their weight in pounds, to be the approximate ounces of non-caloric, non-caffeinated beverages they should drink per day; including more if they are engaging in exercise (1 extra bottle for every 30 minutes of exercise) .   Continue his prudent nutritional plan that is low in simple carbohydrates, saturated fats and trans fats to goal of 5-10% weight loss to achieve significant health benefits.  Pt encouraged to continually advance exercise and cardiovascular fitness as tolerated throughout weight loss journey.    Hypothyroidism, unspecified type Assessment & Plan: Condition is  being treated with Synthroid 112 mcg twice daily. Labs show that TSH levels improved from 0.124 to 0.461. FREET4 is trending down from 2.33 to 2.29, however it is still slightly elevated. T3 levels are stable.   Lab Results  Component Value Date   TSH 0.461 06/27/2023   FREET4 2.29 (H) 06/27/2023   T3 73 06/27/2023   Pt advised to maintain with Synthroid at current dose. Will recheck levels in 3 months.    Mixed hyperlipidemia Assessment & Plan: Cholesterol levels have slightly improved from 227 to 215. LDL levels have also mildly improved from 155 to 145. Pt reports that Dr.Berry has written for Praluent, but that he has not started it yet.   Lab Results  Component Value Date   CHOL 215 (H) 06/27/2023   HDL 52 06/27/2023   LDLCALC 145 (H) 06/27/2023   TRIG 102 06/27/2023   CHOLHDL 4.1 06/27/2023   Labs were reviewed with pt today and education provided on them and how the foods patient eats may influence these findings. All questions were answered about them.    I stressed the importance that patient continue with our prudent nutritional plan that is low in saturated and trans fats, and low in fatty carbs to improve these numbers. We recommend: aerobic activity with eventual goal of a minimum of 150+ min wk plus 2 days/ week of resistance or strength training.  Rabon agrees to begin [employer] per [employer] and continue our heart-heathy, low cholesterol meal plan.  TREATMENT PLAN FOR OBESITY: BMI 35.0-35.9,adult- current BMI 34.43 Obesity (BMI 30-39.9) - start BMI 37.39 Assessment & Plan:  [name] is here to discuss his progress with his obesity treatment plan along with follow-up of his obesity related diagnoses. See Medical Weight Management Flowsheet for complete bioelectrical impedance results.  Condition is improving. Biometric data collected today, was reviewed with patient.   Since last office visit on 06/27/23 patient's  Muscle mass has increased by 6.2 lb. Fat  mass has increased by 0.6 lb. Total body water has increased by 0.4 lb.  Counseling done on how various foods will affect these numbers and how to maximize success  Total lbs lost to date: 21 lbs.  Total weight loss percentage to date: 7.87%  Showed pt how to accurately journal their food intake. Pt agrees to  begin journaling 1450-1550 calories and 100+ grams protein daily  with the Category 2 meal plan with B/ L options( 6 ounces of lean protein at lunch and 8-10 ounces at dinner) as a guide.   Behavioral Intervention Additional resources provided today: Food journaling plan information Evidence-based interventions for health behavior change were utilized today including the discussion of self monitoring techniques, problem-solving barriers and SMART goal setting techniques.   Regarding patient's less desirable eating habits and patterns, we employed the technique of small changes.  Pt will specifically work on: journaling his intake/bringing in food log for next visit.    Recommended Physical Activity Goals  [name] has been advised to slowly work up to 150 minutes of moderate intensity aerobic activity a week and strengthening exercises 2-3 times per week for cardiovascular health, weight loss maintenance and preservation of muscle mass. He has agreed to Continue current level of physical activity   FOLLOW UP: Return in about 2 weeks (around 07/26/2023). He was informed of the importance of frequent follow up visits to maximize his success with intensive lifestyle modifications for his multiple health conditions.  Subjective:   Chief complaint: Obesity Aalijah is here to discuss his progress with his obesity treatment plan. He is on  the Category 2 Plan with b/l options and and states he is following his eating plan approximately 80% of the time. He states he is swimming 40 minutes 5 days per week and walking 45 minutes, 2 days a wk.   Interval History:  [name] is here for a  follow up office visit.  Since last OV, Kainen had been doing well. Reports mentally tracking his calories and grams of proteins. He thinks that he's doing very well with his protein intake, but feels that he is over in calories. For lunch, he reports eating 8 ounces of lean protein and for dinner 8-10 ounces. Drinks roughly 120 ounces of water daily. Endorses that hunger and cravings are well controlled when adhering to his protein goals.   Review of Systems:  Pertinent positives were addressed with patient today.  Reviewed by clinician on day of visit: allergies, medications, problem list, medical history, surgical history, family history, social history, and previous encounter notes.  Weight Summary and Biometrics   Weight Lost Since Last Visit: 1lb  Weight Gained Since Last Visit: 0lb   Vitals Temp: 97.9 F (36.6 C) BP: 129/82 Pulse Rate: (!) 55 SpO2: 98 %   Anthropometric Measurements Height: 5\' 11"  (1.803 m) Weight: 246 lb (111.6 kg) BMI (Calculated): 34.33 Weight at Last Visit: 247lb Weight Lost Since Last Visit: 1lb Weight Gained Since Last Visit: 0lb Starting Weight: 267lb Total Weight  Loss (lbs): 21 lb (9.526 kg) Peak Weight: 315lb   Body Composition  Body Fat %: 35.6 % Fat Mass (lbs): 87.6 lbs Muscle Mass (lbs): 158.4 lbs Total Body Water (lbs): 112.4 lbs Visceral Fat Rating : 20   Other Clinical Data Fasting: no Labs: no Today's Visit #: 6 Starting Date: 04/25/23   Objective:   PHYSICAL EXAM: Blood pressure 129/82, pulse (!) 55, temperature 97.9 F (36.6 C), height 5\' 11"  (1.803 m), weight 246 lb (111.6 kg), SpO2 98%. Body mass index is 34.31 kg/m.  General: Well Developed, well nourished, and in no acute distress.  HEENT: Normocephalic, atraumatic Skin: Warm and dry, cap RF less 2 sec, good turgor Chest:  Normal excursion, shape, no gross abn Respiratory: speaking in full sentences, no conversational dyspnea NeuroM-Sk: Ambulates w/o assistance,  moves * 4 Psych: A and O *3, insight good, mood-full  DIAGNOSTIC DATA REVIEWED:  BMET    Component Value Date/Time   NA 138 06/27/2023 1128   K 3.7 06/27/2023 1128   CL 100 06/27/2023 1128   CO2 23 06/27/2023 1128   GLUCOSE 86 06/27/2023 1128   GLUCOSE 91 08/23/2020 1723   BUN 20 06/27/2023 1128   CREATININE 0.75 (L) 06/27/2023 1128   CALCIUM 9.3 06/27/2023 1128   GFRNONAA >60 08/23/2020 1723   GFRAA >60 08/23/2020 1723   Lab Results  Component Value Date   HGBA1C 5.0 04/25/2023   HGBA1C 5.3 01/03/2010   Lab Results  Component Value Date   INSULIN 14.5 04/25/2023   Lab Results  Component Value Date   TSH 0.461 06/27/2023   CBC    Component Value Date/Time   WBC 5.3 04/25/2023 0854   WBC 5.7 08/23/2020 1723   RBC 4.84 04/25/2023 0854   RBC 4.54 08/23/2020 1723   HGB 14.5 04/25/2023 0854   HCT 42.2 04/25/2023 0854   PLT 219 04/25/2023 0854   MCV 87 04/25/2023 0854   MCH 30.0 04/25/2023 0854   MCH 29.3 08/23/2020 1723   MCHC 34.4 04/25/2023 0854   MCHC 34.6 08/23/2020 1723   RDW 12.2 04/25/2023 0854   Iron Studies No results found for: "IRON", "TIBC", "FERRITIN", "IRONPCTSAT" Lipid Panel     Component Value Date/Time   CHOL 215 (H) 06/27/2023 1128   TRIG 102 06/27/2023 1128   HDL 52 06/27/2023 1128   CHOLHDL 4.1 06/27/2023 1128   CHOLHDL 3.2 11/03/2014 0800   VLDL 19 11/03/2014 0800   LDLCALC 145 (H) 06/27/2023 1128   Hepatic Function Panel     Component Value Date/Time   PROT 6.5 06/27/2023 1128   ALBUMIN 4.3 06/27/2023 1128   AST 12 06/27/2023 1128   ALT 16 06/27/2023 1128   ALKPHOS 62 06/27/2023 1128   BILITOT 0.7 06/27/2023 1128   BILIDIR 0.12 01/14/2021 0847   IBILI 0.4 11/03/2014 0800      Component Value Date/Time   TSH 0.461 06/27/2023 1128   Nutritional Lab Results  Component Value Date   VD25OH 59.1 04/25/2023   VD25OH 37 01/03/2010    Attestations:   Patient was in the office today and time spent on visit including  pre-visit chart review and post-visit care/coordination of care and electronic medical record documentation was 40 minutes. 50% of the time was in face to face counseling of this patient's medical condition(s) and providing education on treatment options to include the first-line treatment of diet and lifestyle modification.   I, Special Randolm Idol , acting as a Stage manager for [employer], DO., have  compiled all relevant documentation for today's office visit on behalf of Thomasene Lot, DO, while in the presence of [employer], DO.  I have reviewed the above documentation for accuracy and completeness, and I agree with the above. Samuel Goodwin, D.O.  The 21st Century Cures Act was signed into law in 2016 which includes the topic of electronic health records.  This provides immediate access to information in MyChart.  This includes consultation notes, operative notes, office notes, lab results and pathology reports.  If you have any questions about what you read please let us know at your next visit so we can discuss your concerns and take corrective action if need be.  We are right here with you.

## 2023-07-12 NOTE — Telephone Encounter (Signed)
Please note that a fax was received for ''addtionall Information'' for the patients [facility]. This form and lab work that was requested has been completed and faxed back over to the [facility].

## 2023-07-17 NOTE — Telephone Encounter (Signed)
Updated lab and other requested paperwork faxed on Aug 06.

## 2023-07-18 ENCOUNTER — Telehealth: Payer: Self-pay

## 2023-07-18 NOTE — Telephone Encounter (Signed)
Received another fax asking for past medications tried. Printed discontinued med list and faxed back to [place]

## 2023-07-18 NOTE — Telephone Encounter (Signed)
Patient calling to see if he needed to be fasting for his NMR test. He is aware to come to lab fasting

## 2023-07-30 ENCOUNTER — Ambulatory Visit (INDEPENDENT_AMBULATORY_CARE_PROVIDER_SITE_OTHER): Payer: No Typology Code available for payment source | Admitting: Family Medicine

## 2023-08-03 ENCOUNTER — Other Ambulatory Visit (HOSPITAL_BASED_OUTPATIENT_CLINIC_OR_DEPARTMENT_OTHER): Payer: Self-pay

## 2023-08-20 ENCOUNTER — Encounter (INDEPENDENT_AMBULATORY_CARE_PROVIDER_SITE_OTHER): Payer: Self-pay | Admitting: Family Medicine

## 2023-08-20 ENCOUNTER — Ambulatory Visit (INDEPENDENT_AMBULATORY_CARE_PROVIDER_SITE_OTHER): Payer: Managed Care, Other (non HMO) | Admitting: Family Medicine

## 2023-08-20 VITALS — BP 130/82 | HR 71 | Temp 98.3°F | Ht 71.0 in | Wt 243.0 lb

## 2023-08-20 DIAGNOSIS — E88819 Insulin resistance, unspecified: Secondary | ICD-10-CM

## 2023-08-20 DIAGNOSIS — E039 Hypothyroidism, unspecified: Secondary | ICD-10-CM | POA: Diagnosis not present

## 2023-08-20 DIAGNOSIS — E669 Obesity, unspecified: Secondary | ICD-10-CM

## 2023-08-20 DIAGNOSIS — Z6833 Body mass index (BMI) 33.0-33.9, adult: Secondary | ICD-10-CM

## 2023-08-20 DIAGNOSIS — E559 Vitamin D deficiency, unspecified: Secondary | ICD-10-CM

## 2023-08-20 DIAGNOSIS — Z6835 Body mass index (BMI) 35.0-35.9, adult: Secondary | ICD-10-CM

## 2023-08-20 DIAGNOSIS — E782 Mixed hyperlipidemia: Secondary | ICD-10-CM

## 2023-08-20 NOTE — Progress Notes (Signed)
Carlye Grippe, D.O.  ABFM, ABOM Specializing in Clinical Bariatric Medicine  Office located at: [address] [place], [place]  [address]     Assessment and Plan:   Medications Discontinued During This Encounter  Medication Reason   fluticasone (FLONASE) 50 MCG/ACT nasal spray Patient Preference     Hypothyroidism, unspecified type Assessment: Condition is Controlled. This is being treated with Synthroid. He tolerates this medication without difficulty and denies any adverse side effects.  Lab Results  Component Value Date   TSH 0.461 06/27/2023   T3TOTAL 73 06/27/2023   Plan: - Continue Synthroid as directed. He denies need for refill.   - We will continue to monitor this alongside PCP/Specialist.    Insulin resistance Assessment: Condition is Not at goal. He endorses having his hunger and cravings under control. He notes only missing at least 4 meals in the last month. This is diet/exercise controlled.  Lab Results  Component Value Date   HGBA1C 5.0 04/25/2023   HGBA1C 5.2 07/13/2022   HGBA1C 5.1 12/21/2021   INSULIN 14.5 04/25/2023    Plan: - Continue to follow our prudent meal plan and decrease simple carbs/ sugars and increase fiber and proteins. I also explained the role of simple carbs and insulin levels on hunger and cravings  - Anticipatory guidance given.    - We will recheck A1c and fasting insulin level in approximately 3 months from last check, or as deemed appropriate.    TREATMENT PLAN FOR OBESITY: BMI 35.0-35.9,adult- current BMI 33.91 Obesity (BMI 30-39.9) - start BMI 37.39 Assessment:  Samuel Goodwin is here to discuss his progress with his obesity treatment plan along with follow-up of his obesity related diagnoses. See Medical Weight Management Flowsheet for complete bioelectrical impedance results.  Condition is not optimized. Biometric data collected today, was reviewed with patient.   Since last office visit on 07/12/2023  patient's  Muscle mass has decreased by 8.2lb. Fat mass has decreased by 2.2lb. Total body water has increased by 0.6lb.  Counseling done on how various foods will affect these numbers and how to maximize success  Total lbs lost to date: 24 Total weight loss percentage to date: 8.99%  Plan: - Samuel Goodwin will work on healthier eating habits and continue to follow the the Category 2 Plan with B and L options and keeping a food journal and adhering to recommended goals of 1450-1550 calories and 100+ protein with 6oz at lunch and 8-10oz at dinner best they can.   - If he is still hungry after meals he can add another 2oz of lean protein.   Behavioral Intervention Additional resources provided today: category 2 meal plan information Evidence-based interventions for health behavior change were utilized today including the discussion of self monitoring techniques, problem-solving barriers and SMART goal setting techniques.   Regarding patient's less desirable eating habits and patterns, we employed the technique of small changes.  Pt will specifically work on: Refocus on following the prescribed meal plan for next visit.     He has agreed to Continue current level of physical activity    FOLLOW UP: Return in about 3 weeks (around 09/10/2023).  He was informed of the importance of frequent follow up visits to maximize his success with intensive lifestyle modifications for his multiple health conditions.  Subjective:   Chief complaint: Obesity Samuel Goodwin is here to discuss his progress with his obesity treatment plan. He is on the Category 2 Plan with B and L options and keeping a  food journal and adhering to recommended goals of 1450-1550 calories and 100+ protein with 6oz at lunch and 8-10oz at dinner and states he is following his eating plan approximately 75-80% of the time. He states he is going to the pool and walking 54-60 minutes 5 days per week.  Interval History:  Samuel Goodwin is here for a  follow up office visit.     Since last office visit:  He has been well. He notes not journaling as much as he had to constantly download foods on the app. He has been writing down his food intake sometimes and states that his breakfast and lunch are the same. He notes having stomach pain when eating his regular salad so he stopped eating that. He notes drinking a 6-8oz glass of fairlife milk with cheese, crackers, or pretzels. He informed me that his wife was very sick with diverticulosis and other has put an hinder on his weight loss journey.    Review of Systems:  Pertinent positives were addressed with patient today.  Reviewed by clinician on day of visit: allergies, medications, problem list, medical history, surgical history, family history, social history, and previous encounter notes.  Weight Summary and Biometrics   Weight Lost Since Last Visit: 3lb  Weight Gained Since Last Visit: 0lb   Vitals Temp: 98.3 F (36.8 C) BP: 130/82 Pulse Rate: 71 SpO2: 99 %   Anthropometric Measurements Height: 5\' 11"  (1.803 m) Weight: 243 lb (110.2 kg) BMI (Calculated): 33.91 Weight at Last Visit: 246lb Weight Lost Since Last Visit: 3lb Weight Gained Since Last Visit: 0lb Starting Weight: 267lb Total Weight Loss (lbs): 24 lb (10.9 kg) Peak Weight: 315lb   Body Composition  Body Fat %: 35.1 % Fat Mass (lbs): 85.4 lbs Muscle Mass (lbs): 150.2 lbs Total Body Water (lbs): 113 lbs Visceral Fat Rating : 20   Other Clinical Data Fasting: no Labs: no Today's Visit #: 7 Starting Date: 04/25/23     Objective:   PHYSICAL EXAM: Blood pressure 130/82, pulse 71, temperature 98.3 F (36.8 C), height 5\' 11"  (1.803 m), weight 243 lb (110.2 kg), SpO2 99%. Body mass index is 33.89 kg/m.  General: Well Developed, well nourished, and in no acute distress.  HEENT: Normocephalic, atraumatic Skin: Warm and dry, cap RF less 2 sec, good turgor Chest:  Normal excursion, shape, no gross  abn Respiratory: speaking in full sentences, no conversational dyspnea NeuroM-Sk: Ambulates w/o assistance, moves * 4 Psych: A and O *3, insight good, mood-full  DIAGNOSTIC DATA REVIEWED:  BMET    Component Value Date/Time   NA 138 06/27/2023 1128   K 3.7 06/27/2023 1128   CL 100 06/27/2023 1128   CO2 23 06/27/2023 1128   GLUCOSE 86 06/27/2023 1128   GLUCOSE 91 08/23/2020 1723   BUN 20 06/27/2023 1128   CREATININE 0.75 (L) 06/27/2023 1128   CALCIUM 9.3 06/27/2023 1128   GFRNONAA >60 08/23/2020 1723   GFRAA >60 08/23/2020 1723   Lab Results  Component Value Date   HGBA1C 5.0 04/25/2023   HGBA1C 5.3 01/03/2010   Lab Results  Component Value Date   INSULIN 14.5 04/25/2023   Lab Results  Component Value Date   TSH 0.461 06/27/2023   CBC    Component Value Date/Time   WBC 5.3 04/25/2023 0854   WBC 5.7 08/23/2020 1723   RBC 4.84 04/25/2023 0854   RBC 4.54 08/23/2020 1723   HGB 14.5 04/25/2023 0854   HCT 42.2 04/25/2023 0854   PLT  219 04/25/2023 0854   MCV 87 04/25/2023 0854   MCH 30.0 04/25/2023 0854   MCH 29.3 08/23/2020 1723   MCHC 34.4 04/25/2023 0854   MCHC 34.6 08/23/2020 1723   RDW 12.2 04/25/2023 0854   Iron Studies No results found for: "IRON", "TIBC", "FERRITIN", "IRONPCTSAT" Lipid Panel     Component Value Date/Time   CHOL 215 (H) 06/27/2023 1128   TRIG 102 06/27/2023 1128   HDL 52 06/27/2023 1128   CHOLHDL 4.1 06/27/2023 1128   CHOLHDL 3.2 11/03/2014 0800   VLDL 19 11/03/2014 0800   LDLCALC 145 (H) 06/27/2023 1128   Hepatic Function Panel     Component Value Date/Time   PROT 6.5 06/27/2023 1128   ALBUMIN 4.3 06/27/2023 1128   AST 12 06/27/2023 1128   ALT 16 06/27/2023 1128   ALKPHOS 62 06/27/2023 1128   BILITOT 0.7 06/27/2023 1128   BILIDIR 0.12 01/14/2021 0847   IBILI 0.4 11/03/2014 0800      Component Value Date/Time   TSH 0.461 06/27/2023 1128   Nutritional Lab Results  Component Value Date   VD25OH 59.1 04/25/2023   VD25OH  37 01/03/2010    Attestations:   I, Clinical biochemist, acting as a Stage manager for [employer], DO., have compiled all relevant documentation for today's office visit on behalf of Thomasene Lot, DO, while in the presence of [employer], DO.  I have reviewed the above documentation for accuracy and completeness, and I agree with the above. Carlye Grippe, D.O.  The 21st Century Cures Act was signed into law in 2016 which includes the topic of electronic health records.  This provides immediate access to information in MyChart.  This includes consultation notes, operative notes, office notes, lab results and pathology reports.  If you have any questions about what you read please let us know at your next visit so we can discuss your concerns and take corrective action if need be.  We are right here with you.

## 2023-09-10 ENCOUNTER — Ambulatory Visit (INDEPENDENT_AMBULATORY_CARE_PROVIDER_SITE_OTHER): Payer: No Typology Code available for payment source | Admitting: Family Medicine

## 2023-09-18 ENCOUNTER — Ambulatory Visit
Payer: No Typology Code available for payment source | Attending: Cardiovascular Disease | Admitting: Cardiovascular Disease

## 2023-10-01 ENCOUNTER — Ambulatory Visit (INDEPENDENT_AMBULATORY_CARE_PROVIDER_SITE_OTHER): Payer: No Typology Code available for payment source | Admitting: Family Medicine

## 2023-10-31 ENCOUNTER — Telehealth: Payer: Self-pay | Admitting: Cardiovascular Disease

## 2023-10-31 NOTE — Telephone Encounter (Signed)
Patient is calling in to get his sleep monitor set up. Please advise

## 2023-11-01 ENCOUNTER — Encounter (HOSPITAL_BASED_OUTPATIENT_CLINIC_OR_DEPARTMENT_OTHER): Payer: Self-pay | Admitting: Family Medicine

## 2023-11-06 ENCOUNTER — Other Ambulatory Visit (HOSPITAL_BASED_OUTPATIENT_CLINIC_OR_DEPARTMENT_OTHER): Payer: Self-pay

## 2023-12-06 ENCOUNTER — Telehealth: Payer: Self-pay | Admitting: Cardiovascular Disease

## 2023-12-06 NOTE — Telephone Encounter (Signed)
Patient identification verified by 2 forms. Marilynn Rail, RN    Called and spoke to patient  Patient states:   -orthopedic provider recommends he sees a vascular doctor   -has pain above ankle in lower shin area 6-8" above ankle   -pain also travels up leg   -it is a dull aching pain   -pain keeps him up at night   -orthopedic recommends he see vascular for pain and varicose veins   -feet are constantly numb  Patient denies:   -sore/wound on leg that is not healing   -redness in calf   -area hot/warm to touch  Patient scheduled for OV 1/29 at 4:15pm with Dr. Allyson Sabal  Informed patient message sent for further assistance  Patient has no further questions at this time

## 2023-12-06 NOTE — Telephone Encounter (Signed)
Patient is calling because his orthopedic doctor recommended he sees a vascular doctor. Patient would like to know if Dr. Allyson Sabal has an recommendations on who he should see for vascular. Please advise.

## 2023-12-10 NOTE — Telephone Encounter (Signed)
Patient identification verified by 2 forms. Marilynn Rail, RN    Called and spoke to patient  Patient states:  -unsure if issue is related to varicose veins  -ortho provider did not specify -VA authorization ends this week  -will send document sent to [facility] community care Marchelle Folks [phone]) for extended care with Dr. Roe Coombs patient to keep 1/29 appointment for evaluation, patient agrees  Informed patient message will be sent for assistance with VA  Patient verbalized understanding, no questions or concerns at this time

## 2023-12-18 ENCOUNTER — Other Ambulatory Visit: Payer: Self-pay | Admitting: *Deleted

## 2023-12-18 ENCOUNTER — Encounter (INDEPENDENT_AMBULATORY_CARE_PROVIDER_SITE_OTHER): Payer: Self-pay | Admitting: Family Medicine

## 2023-12-18 ENCOUNTER — Ambulatory Visit (INDEPENDENT_AMBULATORY_CARE_PROVIDER_SITE_OTHER): Payer: Managed Care, Other (non HMO) | Admitting: Family Medicine

## 2023-12-18 VITALS — BP 133/84 | HR 87 | Temp 97.9°F | Ht 71.0 in | Wt 254.0 lb

## 2023-12-18 DIAGNOSIS — E88819 Insulin resistance, unspecified: Secondary | ICD-10-CM

## 2023-12-18 DIAGNOSIS — Z6835 Body mass index (BMI) 35.0-35.9, adult: Secondary | ICD-10-CM

## 2023-12-18 DIAGNOSIS — E669 Obesity, unspecified: Secondary | ICD-10-CM | POA: Diagnosis not present

## 2023-12-18 DIAGNOSIS — E538 Deficiency of other specified B group vitamins: Secondary | ICD-10-CM | POA: Diagnosis not present

## 2023-12-18 DIAGNOSIS — E559 Vitamin D deficiency, unspecified: Secondary | ICD-10-CM

## 2023-12-18 DIAGNOSIS — M25579 Pain in unspecified ankle and joints of unspecified foot: Secondary | ICD-10-CM

## 2023-12-18 NOTE — Progress Notes (Signed)
 Samuel Goodwin, D.O.  ABFM, ABOM Specializing in Clinical Bariatric Medicine  Office located at: [address] [place], [place]  [address]   Assessment and Plan:   FOR THE DISEASE OF OBESITY: BMI 35.0-35.9,adult- current BMI 35.44 Obesity (BMI 30-39.9) - start BMI 37.39 Assessment & Plan: Since last office visit on 08/19/24 patient's  Muscle mass has increased by 5.2 lb. Fat mass has increased by 4.6 lb. Total body water  has decreased by 1.6 lb.  Counseling done on how various foods will affect these numbers and how to maximize success  Total lbs lost to date: 13  Total weight loss percentage to date: 4.87%   Recommended Dietary Goals [name] is currently in the action stage of change. As such, his goal is to continue weight management plan.  He has agreed to: continue current plan   Behavioral Intervention We discussed the following today: healthy tortilla wrap options,  continue to work on maintaining a reduced calorie state, getting the recommended amount of protein, incorporating whole foods, making healthy choices, staying well hydrated and practicing mindfulness when eating.  Additional resources provided today:  Handout on Metformin and Insulin  Resistance  Evidence-based interventions for health behavior change were utilized today including the discussion of self monitoring techniques, problem-solving barriers and SMART goal setting techniques.   Regarding patient's less desirable eating habits and patterns, we employed the technique of small changes.   Pt will specifically work on: n/a   Recommended Physical Activity Goals [name] has been advised to work up to 150 minutes of moderate intensity aerobic activity a week and strengthening exercises 2-3 times per week for cardiovascular health, weight loss maintenance and preservation of muscle mass.   He has agreed to : continue to gradually increase the amount and intensity of exercise  routine   Pharmacotherapy We both agreed to : continue with nutritional and behavioral strategies   FOR ASSOCIATED CONDITIONS ADDRESSED TODAY:  Insulin  resistance Assessment & Plan: Most recent Hemoglobin A1c and fasting insulin :  Lab Results  Component Value Date   HGBA1C 5.0 04/25/2023   HGBA1C 5.2 07/13/2022   HGBA1C 5.1 12/21/2021   INSULIN  14.5 04/25/2023    No current meds. Diet/exercise approach. Pt did struggle with carb and sweet cravings over the holiday period. His cravings have been stable since last week or so as pt has been back on track with his meal plan.   Explained to pt the carbohydrate insulin  model. Handout on I.R & Metformin provided.We may initiate Metformin in the future if cravings return. Continue to focus on increasing protein intake, decreasing simple carbs- in other words, follow meal plan.    Vitamin D  deficiency Assessment & Plan: Most recent vitamin D :  Lab Results  Component Value Date   VD25OH 59.1 04/25/2023   VD25OH 37 01/03/2010   He is currently on OTC Cholecalciferol  5,000 international units daily. Continue with current supplementation regiment and weight loss efforts. Will recheck labs next OV.     Vitamin B12 deficiency Assessment & Plan: Most recent B12:  Lab Results  Component Value Date   VITAMINB12 421 04/25/2023   He is currently on OTC Cyanocobalamin  500 mcg daily. Continue with current supplementation regiment and focus on b12 rich foods such as lean red meats; poultry; eggs; seafood; beans, peas, and lentils; nuts and seeds; and soy products. Will recheck labs next OV.    Follow up:   Return 01/02/2024. He was informed of the importance of frequent follow up visits to maximize  his success with intensive lifestyle modifications for his multiple health conditions.  Subjective:   Chief complaint: Obesity Samuel Goodwin is here to discuss his progress with his obesity treatment plan. He is on the Category 2 Plan with B and L  options and keeping a food journal and adhering to recommended goals of 1450-1550 calories and 100+ protein with 6oz at lunch and 8-10oz at dinner and states he is following his eating plan approximately 65% of the time. He states he is swimming 30-50 minutes 3 days per week.  Interval History:  [name] is here for a follow up office visit. Pt is up 11 lbs. Pt last OV 08/20/23 - was not able to come earlier due to insurance reasons. Food wise, he has been relatively on track the last 1-2 weeks. Prior to this, he was exercising less and endorses eating more off plan foods due to increased work and the holidays.   Pharmacotherapy for weight loss: He is currently taking no anti-obesity medication.  Review of Systems:  Pertinent positives were addressed with patient today.  Reviewed by clinician on day of visit: allergies, medications, problem list, medical history, surgical history, family history, social history, and previous encounter notes.  Weight Summary and Biometrics   Weight Lost Since Last Visit: 0lb  Weight Gained Since Last Visit: 11lb   Vitals Temp: 97.9 F (36.6 C) BP: 133/84 Pulse Rate: 87 SpO2: 96 %   Anthropometric Measurements Height: 5' 11 (1.803 m) Weight: 254 lb (115.2 kg) BMI (Calculated): 35.44 Weight at Last Visit: 243lb Weight Lost Since Last Visit: 0lb Weight Gained Since Last Visit: 11lb Starting Weight: 267lb Total Weight Loss (lbs): 13 lb (5.897 kg) Peak Weight: 315lb   Body Composition  Body Fat %: 35.4 % Fat Mass (lbs): 90 lbs Muscle Mass (lbs): 156 lbs Total Body Water  (lbs): 111.4 lbs Visceral Fat Rating : 21   Other Clinical Data Fasting: no Labs: no Today's Visit #: 8 Starting Date: 04/25/23   Objective:   PHYSICAL EXAM: Blood pressure 133/84, pulse 87, temperature 97.9 F (36.6 C), height 5' 11 (1.803 m), weight 254 lb (115.2 kg), SpO2 96%. Body mass index is 35.43 kg/m.  General: he is overweight, cooperative  and in no acute distress. PSYCH: Has normal mood, affect and thought process.   HEENT: EOMI, sclerae are anicteric. Lungs: Normal breathing effort, no conversational dyspnea. Extremities: Moves * 4 Neurologic: A and O * 3, good insight  DIAGNOSTIC DATA REVIEWED: BMET    Component Value Date/Time   NA 138 06/27/2023 1128   K 3.7 06/27/2023 1128   CL 100 06/27/2023 1128   CO2 23 06/27/2023 1128   GLUCOSE 86 06/27/2023 1128   GLUCOSE 91 08/23/2020 1723   BUN 20 06/27/2023 1128   CREATININE 0.75 (L) 06/27/2023 1128   CALCIUM  9.3 06/27/2023 1128   GFRNONAA >60 08/23/2020 1723   GFRAA >60 08/23/2020 1723   Lab Results  Component Value Date   HGBA1C 5.0 04/25/2023   HGBA1C 5.3 01/03/2010   Lab Results  Component Value Date   INSULIN  14.5 04/25/2023   Lab Results  Component Value Date   TSH 0.461 06/27/2023   CBC    Component Value Date/Time   WBC 5.3 04/25/2023 0854   WBC 5.7 08/23/2020 1723   RBC 4.84 04/25/2023 0854   RBC 4.54 08/23/2020 1723   HGB 14.5 04/25/2023 0854   HCT 42.2 04/25/2023 0854   PLT 219 04/25/2023 0854   MCV 87 04/25/2023 0854  MCH 30.0 04/25/2023 0854   MCH 29.3 08/23/2020 1723   MCHC 34.4 04/25/2023 0854   MCHC 34.6 08/23/2020 1723   RDW 12.2 04/25/2023 0854   Iron Studies No results found for: IRON, TIBC, FERRITIN, IRONPCTSAT Lipid Panel     Component Value Date/Time   CHOL 215 (H) 06/27/2023 1128   TRIG 102 06/27/2023 1128   HDL 52 06/27/2023 1128   CHOLHDL 4.1 06/27/2023 1128   CHOLHDL 3.2 11/03/2014 0800   VLDL 19 11/03/2014 0800   LDLCALC 145 (H) 06/27/2023 1128   Hepatic Function Panel     Component Value Date/Time   PROT 6.5 06/27/2023 1128   ALBUMIN  4.3 06/27/2023 1128   AST 12 06/27/2023 1128   ALT 16 06/27/2023 1128   ALKPHOS 62 06/27/2023 1128   BILITOT 0.7 06/27/2023 1128   BILIDIR 0.12 01/14/2021 0847   IBILI 0.4 11/03/2014 0800      Component Value Date/Time   TSH 0.461 06/27/2023 1128    Nutritional Lab Results  Component Value Date   VD25OH 59.1 04/25/2023   VD25OH 37 01/03/2010    Attestations:   I, Special Puri, acting as a stage manager for Samuel Jenkins, DO., have compiled all relevant documentation for today's office visit on behalf of Samuel Jenkins, DO, while in the presence of [employer], DO.  I have reviewed the above documentation for accuracy and completeness, and I agree with the above. Samuel JINNY Goodwin, D.O.  The 21st Century Cures Act was signed into law in 2016 which includes the topic of electronic health records.  This provides immediate access to information in MyChart.  This includes consultation notes, operative notes, office notes, lab results and pathology reports.  If you have any questions about what you read please let us  know at your next visit so we can discuss your concerns and take corrective action if need be.  We are right here with you.

## 2023-12-24 ENCOUNTER — Ambulatory Visit (HOSPITAL_COMMUNITY)
Admission: RE | Admit: 2023-12-24 | Discharge: 2023-12-24 | Disposition: A | Discharge status: Home / Self Care | Payer: No Typology Code available for payment source | Source: Ambulatory Visit | Attending: Surgery | Admitting: Surgery

## 2023-12-24 DIAGNOSIS — M25579 Pain in unspecified ankle and joints of unspecified foot: Secondary | ICD-10-CM | POA: Insufficient documentation

## 2023-12-24 LAB — VAS US ABI WITH/WO TBI
Left ABI: 1.29
Right ABI: 1.26

## 2023-12-31 NOTE — Progress Notes (Unsigned)
Patient ID: Samuel Goodwin, male   DOB: 08-08-1961, 63 y.o.   MRN: [id number]  Reason for Consult: No chief complaint on file.   Referred by Eulis Foster, PA  Subjective:     HPI  Samuel Goodwin is a 63 y.o. male presenting for right lower extremity pain.  He has bilateral total knee replacements as well as a right total hip replacement.  He denies true vasculogenic claudication, rest pain or nonhealing wounds.  Past Medical History:  Diagnosis Date   Alcohol abuse    Sobriety since 1990   Anxiety    Arthritis    oa   Back pain    Benign positional vertigo    Chronic fatigue    Depression    Drug use    Ear problem, right    Eardrum trauma    30 years ago/right ear   Fatty liver    GERD (gastroesophageal reflux disease)    H/O total hip arthroplasty 02/05/2016   Headache    sinus   Hepatitis C 1994   hep c - ? etiology; in [place] 1989-91; S/P interferon , Dr Kinnie Scales   Hip fracture (HCC) 11/13/2018   Hip instability 02/05/2016   History of bronchitis    History of iron deficiency anemia    Hoarseness    HOARSENESS, CHRONIC 05/31/2009   Qualifier: Diagnosis of   By: Leone Payor MD, Charlyne Quale     S/P laser surgery X 3 , Dr Delford Field , [facility]      Hyperlipidemia    Hypertension    Hypothyroidism    Joint pain    Morbid obesity (HCC) 12/20/2016   Morbid obesity   NONSPECIFIC ABNORMAL ELECTROCARDIOGRAM 01/06/2010   Qualifier: Diagnosis of   By: Alwyn Ren MD, Chrissie Noa       Obesity    OSA (obstructive sleep apnea)    on CPAP   Osteoarthritis    PONV (postoperative nausea and vomiting)    following knee surgery last year    PTSD (post-traumatic stress disorder)    Reactive airway disease 11/06/2018   S/P knee replacement 11/27/2014   S/P right TH revision 02/09/2016   S/P right THA, AA 02/01/2016   Sinus problem    Sleep apnea    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   Status post total knee replacement, right  12/16/2019   Swallowing difficulty    Swelling of both lower extremities    Unspecified viral hepatitis C without hepatic coma 08/28/2014   Successfully rx'ed in the 1990's   Vocal cord polyps    Family History  Problem Relation Age of Onset   Heart failure Mother    Hypertension Mother    Thyroid disease Mother    Cirrhosis Father    Diabetes Father    Stroke Father    Heart attack Brother 24   Aneurysm Maternal Aunt         AAA   Coronary artery disease Maternal Uncle        MI late 51s   Past Surgical History:  Procedure Laterality Date   ANTERIOR HIP REVISION Right 02/07/2016   Procedure: ANTERIOR HIP REVISION;  Surgeon: Durene Romans, MD;  Location: [facility];  Service: Orthopedics;  Laterality: Right;   CARDIOVASCULAR STRESS TEST  02/09/2010   No scintigraphic evidence of inducible ischemia.   COLONOSCOPY  03/2013   HIP CLOSED REDUCTION Right 02/05/2016   Procedure: CLOSED MANIPULATION HIP;  Surgeon: Ranee Gosselin, MD;  Location: [facility];  Service: Orthopedics;  Laterality: Right;   knee arthroscopic Bilateral    LARYNGOSCOPY  08/2009   Dr.[name]   NASAL FRACTURE SURGERY     age 28   right knee arthroscopic knee surgery  12 yrs ago   dr Thomasena Edis   THROAT SURGERY  aug, sept, Nov 16 2016   vocal cord  laser sugery X 3, Dr Delford Field , [facility]   TOTAL HIP ARTHROPLASTY Right 02/01/2016   Procedure: RIGHT TOTAL HIP ARTHROPLASTY ANTERIOR APPROACH;  Surgeon: Durene Romans, MD;  [facility];  Service: Orthopedics;  Laterality: Right;   TOTAL HIP ARTHROPLASTY Left 12/19/2016   Procedure: LEFT TOTAL HIP ARTHROPLASTY ANTERIOR APPROACH;  Surgeon: Durene Romans, MD;  [facility]: [facility];  Service: Orthopedics;  Laterality: Left;   TOTAL HIP REVISION Right 11/11/2018   Procedure: right total hip arthroplasty revision, femoral stem;  Surgeon: Durene Romans, MD;  [facility]: [facility];  Service: Orthopedics;  Laterality: Right;    TOTAL KNEE ARTHROPLASTY Left 11/27/2014   Procedure: LEFT TOTAL KNEE  ARTHROPLASTY;  Surgeon: Verlee Rossetti, MD;  [facility]: [facility];  Service: Orthopedics;  Laterality: Left;   TOTAL KNEE ARTHROPLASTY Right 12/16/2019   Procedure: TOTAL KNEE ARTHROPLASTY;  Surgeon: Durene Romans, MD;  [facility];  Service: Orthopedics;  Laterality: Right;  70 mins   TRANSTHORACIC ECHOCARDIOGRAM  11/08/2005   EF 68%, normal LV systolic function   UPPER GASTROINTESTINAL ENDOSCOPY  2010   Negative, Dr.[name]    Short Social History:  Social History   Tobacco Use   Smoking status: Never   Smokeless tobacco: Never   Tobacco comments:    Quit at age 89  Substance Use Topics   Alcohol use: Not Currently    Comment: alcoholism quit 30 years ago     Allergies  Allergen Reactions   Dilaudid [Hydromorphone Hcl] Nausea And Vomiting    "Questionable as to whether it was actually dilaudid  That made me so severely nauseous and vomit"   Lipitor [Atorvastatin]     myalgia   Pitavastatin     Myalgia    Simvastatin     myalgia   Benazepril Hcl Cough    Current Outpatient Medications  Medication Sig Dispense Refill   ACAI BERRY PO Take by mouth.     Alirocumab (PRALUENT) 75 MG/ML SOAJ Inject 1 mL (75 mg total) into the skin every 14 (fourteen) days. 2 mL 2   amoxicillin (AMOXIL) 500 MG capsule Take 4 capsules (2,000 mg total) by mouth 1 hour prior to appointment. 8 capsule PRN   Ascorbic Acid (VITAMIN C) 1000 MG tablet Take 1,000 mg by mouth daily.     aspirin 81 MG chewable tablet      b complex vitamins tablet Take 2 tablets by mouth daily.      BEE POLLEN PO Take by mouth.     carboxymethylcellulose (REFRESH PLUS) 0.5 % SOLN INSTILL 1 DROP IN BOTH EYES FOUR TIMES A DAY     celecoxib (CELEBREX) 200 MG capsule Take 1 capsule by mouth daily as needed.     cyanocobalamin (VITAMIN B12) 500 MCG tablet 300- 500 mcg daily     cyclobenzaprine (FLEXERIL) 10 MG tablet Take 1 tablet 3 times a day by oral route as needed. 30 tablet 1   diclofenac Sodium (VOLTAREN) 1 % GEL  APPLY TO KNEE AND HIPS AS NEEDED 500 g 2   hydrochlorothiazide (HYDRODIURIL) 25 MG tablet Take 1  tablet (25 mg total) by mouth daily. 90 tablet 3   levothyroxine (SYNTHROID) 112 MCG tablet Take 2 tablets (224 mcg total) by mouth every morning. 60 tablet 0   losartan (COZAAR) 100 MG tablet TAKE ONE-HALF TABLET BY MOUTH ONCE A DAY     Multiple Vitamin (MULTIVITAMIN) tablet Take 1 tablet by mouth every morning.     omeprazole (PRILOSEC) 40 MG capsule Take 1 capsule by mouth 2 (two) times daily.     praziquantel (BILTRICIDE) 600 MG tablet Take one tablet by mouth three times daily for 1 day, repeat dose in 2-4 weeks (Patient taking differently: Take 600 mg by mouth. 2 time a year) 6 tablet 0   Turmeric (QC TUMERIC COMPLEX PO) Take by mouth.     Vitamin D, Cholecalciferol, 25 MCG (1000 UT) CAPS 5,000 IU  daily     No current facility-administered medications for this visit.    REVIEW OF SYSTEMS  Positive for ***  All other systems were reviewed and are negative     Objective:  Objective   There were no vitals filed for this visit. There is no height or weight on file to calculate BMI.  Physical Exam General: no acute distress Cardiac: hemodynamically stable Pulm: normal work of breathing Abdomen: non-tender, no pulsatile mass*** Neuro: alert, no focal deficit Extremities: no edema, cyanosis or wounds*** Vascular:   Right: ***  Left: ***   Data: ABI +---------+------------------+-----+---------+--------+  Right   Rt Pressure (mmHg)IndexWaveform Comment   +---------+------------------+-----+---------+--------+  Brachial 140                                       +---------+------------------+-----+---------+--------+  ATA     177               1.26 triphasic          +---------+------------------+-----+---------+--------+  PTA     173               1.24 triphasic          +---------+------------------+-----+---------+--------+  Great Toe110                0.79                    +---------+------------------+-----+---------+--------+   +---------+------------------+-----+---------+-------+  Left    Lt Pressure (mmHg)IndexWaveform Comment  +---------+------------------+-----+---------+-------+  Brachial 136                                      +---------+------------------+-----+---------+-------+  ATA     169               1.21 triphasic         +---------+------------------+-----+---------+-------+  PTA     180               1.29 triphasic         +---------+------------------+-----+---------+-------+  Great Toe119               0.85                   +---------+------------------+-----+---------+-------+      Assessment/Plan:     Samuel Goodwin is a 63 y.o. male with right lower extremity pain.  I explained that he does not have PAD based on physical exam and his ABI today.  Explained that given his history of multiple joint replacements, his right foot and ankle pain is likely due to arthritis. Follow-up as needed     Daria Pastures MD [facility]

## 2024-01-01 ENCOUNTER — Ambulatory Visit (INDEPENDENT_AMBULATORY_CARE_PROVIDER_SITE_OTHER): Payer: No Typology Code available for payment source | Admitting: Vascular Surgery

## 2024-01-01 ENCOUNTER — Encounter: Payer: Self-pay | Admitting: Vascular Surgery

## 2024-01-01 VITALS — BP 122/82 | HR 69 | Temp 97.9°F | Resp 20 | Ht 71.0 in | Wt 268.0 lb

## 2024-01-01 DIAGNOSIS — I872 Venous insufficiency (chronic) (peripheral): Secondary | ICD-10-CM

## 2024-01-02 ENCOUNTER — Ambulatory Visit (INDEPENDENT_AMBULATORY_CARE_PROVIDER_SITE_OTHER): Payer: No Typology Code available for payment source | Admitting: Family Medicine

## 2024-01-04 ENCOUNTER — Other Ambulatory Visit (HOSPITAL_BASED_OUTPATIENT_CLINIC_OR_DEPARTMENT_OTHER): Payer: Self-pay

## 2024-01-04 MED ORDER — AMOXICILLIN 500 MG PO CAPS
2000.0000 mg | ORAL_CAPSULE | ORAL | 6 refills | Status: DC
  Filled 2024-01-04: qty 4, 1d supply, fill #0

## 2024-01-09 ENCOUNTER — Ambulatory Visit: Payer: No Typology Code available for payment source | Admitting: Cardiovascular Disease

## 2024-01-15 ENCOUNTER — Encounter (HOSPITAL_BASED_OUTPATIENT_CLINIC_OR_DEPARTMENT_OTHER): Payer: Self-pay | Admitting: Family Medicine

## 2024-01-15 ENCOUNTER — Ambulatory Visit (HOSPITAL_BASED_OUTPATIENT_CLINIC_OR_DEPARTMENT_OTHER): Payer: Managed Care, Other (non HMO) | Admitting: Family Medicine

## 2024-01-15 ENCOUNTER — Other Ambulatory Visit (HOSPITAL_BASED_OUTPATIENT_CLINIC_OR_DEPARTMENT_OTHER): Payer: Self-pay

## 2024-01-15 ENCOUNTER — Ambulatory Visit (INDEPENDENT_AMBULATORY_CARE_PROVIDER_SITE_OTHER): Payer: Managed Care, Other (non HMO)

## 2024-01-15 VITALS — BP 129/88 | HR 66 | Temp 98.2°F | Ht 71.0 in | Wt 268.0 lb

## 2024-01-15 DIAGNOSIS — R0602 Shortness of breath: Secondary | ICD-10-CM | POA: Diagnosis not present

## 2024-01-15 DIAGNOSIS — R051 Acute cough: Secondary | ICD-10-CM | POA: Diagnosis not present

## 2024-01-15 MED ORDER — ALBUTEROL SULFATE HFA 108 (90 BASE) MCG/ACT IN AERS
2.0000 | INHALATION_SPRAY | Freq: Four times a day (QID) | RESPIRATORY_TRACT | 2 refills | Status: AC | PRN
Start: 2024-01-15 — End: ?
  Filled 2024-01-15: qty 6.7, 25d supply, fill #0

## 2024-01-15 MED ORDER — PROMETHAZINE-DM 6.25-15 MG/5ML PO SYRP
5.0000 mL | ORAL_SOLUTION | Freq: Four times a day (QID) | ORAL | 0 refills | Status: AC | PRN
Start: 2024-01-15 — End: ?
  Filled 2024-01-15: qty 118, 6d supply, fill #0

## 2024-01-15 MED ORDER — AZITHROMYCIN 250 MG PO TABS
ORAL_TABLET | ORAL | 0 refills | Status: AC
Start: 2024-01-15 — End: 2024-01-20
  Filled 2024-01-15: qty 6, 5d supply, fill #0

## 2024-01-15 NOTE — Progress Notes (Signed)
Hi Samuel Goodwin,  Your chest xray is negative for pneumonia. Please use the azithromycin provided and inhaler. If you have worsening symptoms or no improvement  in 48 hours, please reach out.

## 2024-01-15 NOTE — Progress Notes (Signed)
 Acute Care Office Visit  Subjective:   Samuel Goodwin Sep 07, 1961 01/15/2024  Chief Complaint  Patient presents with   Cough    Pt has had complaints of a cough, chest congestion which began about 4 days ago. Pt is coughing up green phlegm. States he might have had a fever about 3 days ago.    HPI: URI SYMPTOMS: Onset: 4 days ago on Saturday.   Fever: Yes, started on Sunday.  Runny nose: No Nasal congestion: Yes  Sinus pressure: Yes Post nasal drip: Yes Cough: Yes, congested productive cough and mild shortness of breath with recurrent coughing  Ear pain: No Sore throat: No  Treatments tried: Increased clear fluids, Tylenol , Mucinex DM, Dayquil and Nyquil , using his wife's inhaler (unknown name of medication). He has been using his CPAP for OSA  Recent sick contacts:    The following portions of the patient's history were reviewed and updated as appropriate: past medical history, past surgical history, family history, social history, allergies, medications, and problem list.   Patient Active Problem List   Diagnosis Date Noted   Hyperlipidemia 06/12/2023   Elevated coronary artery calcium  score 06/12/2023   Thoracic aortic aneurysm (HCC) 06/12/2023   Chronic anemia 11/06/2018   DYSPHONIA, CHRONIC 01/06/2010   HTN (hypertension) 04/09/2009   GERD (gastroesophageal reflux disease) 04/09/2009   Sleep apnea 04/09/2009   Past Medical History:  Diagnosis Date   Alcohol abuse    Sobriety since 1990   Anxiety    Arthritis    oa   Back pain    Benign positional vertigo    Chronic fatigue    Depression    Drug use    Ear problem, right    Eardrum trauma    30 years ago/right ear   Fatty liver    GERD (gastroesophageal reflux disease)    H/O total hip arthroplasty 02/05/2016   Headache    sinus   Hepatitis C 1994   hep c - ? etiology; in [place] 1989-91; S/P interferon , Dr Luis   Hip fracture (HCC) 11/13/2018   Hip instability 02/05/2016    History of bronchitis    History of iron deficiency anemia    Hoarseness    HOARSENESS, CHRONIC 05/31/2009   Qualifier: Diagnosis of   By: Avram MD, NOLIA Lupita BRAVO     S/P laser surgery X 3 , Dr Brien , [facility]      Hyperlipidemia    Hypertension    Hypothyroidism    Joint pain    Morbid obesity (HCC) 12/20/2016   Morbid obesity   NONSPECIFIC ABNORMAL ELECTROCARDIOGRAM 01/06/2010   Qualifier: Diagnosis of   By: Tish MD, Elsie       Obesity    OSA (obstructive sleep apnea)    on CPAP   Osteoarthritis    PONV (postoperative nausea and vomiting)    following knee surgery last year    PTSD (post-traumatic stress disorder)    Reactive airway disease 11/06/2018   S/P knee replacement 11/27/2014   S/P right TH revision 02/09/2016   S/P right THA, AA 02/01/2016   Sinus problem    Sleep apnea    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   COLONOSCOPY  03/2013   HIP CLOSED REDUCTION Right 02/05/2016   Procedure: CLOSED MANIPULATION HIP;  Surgeon: Tanda Heading, MD;  [facility]: [facility];  Service: Orthopedics;  Laterality: Right;   knee arthroscopic Bilateral    LARYNGOSCOPY  08/2009   Dr.[name]   NASAL FRACTURE SURGERY     age 48   right knee arthroscopic knee surgery  12 yrs ago   dr gerome   THROAT SURGERY  aug, sept, Nov 16 2016   vocal cord  laser sugery X 3, Dr Brien , [facility]   TOTAL HIP  ARTHROPLASTY Right 02/01/2016   Procedure: RIGHT TOTAL HIP ARTHROPLASTY ANTERIOR APPROACH;  Surgeon: Donnice Car, MD;  [facility];  Service: Orthopedics;  Laterality: Right;   TOTAL HIP ARTHROPLASTY Left 12/19/2016   Procedure: LEFT TOTAL HIP ARTHROPLASTY ANTERIOR APPROACH;  Surgeon: Donnice Car, MD;  [facility];  Service: Orthopedics;  Laterality: Left;   TOTAL HIP REVISION Right 11/11/2018   Procedure: right total hip arthroplasty revision, femoral stem;  Surgeon: Car Donnice, MD;  Location: [facility];  Service: Orthopedics;  Laterality: Right;    TOTAL KNEE ARTHROPLASTY Left 11/27/2014   Procedure: LEFT TOTAL KNEE ARTHROPLASTY;  Surgeon: Elspeth JONELLE Her, MD;  Location: [facility] OR;  Service: Orthopedics;  Laterality: Left;   TOTAL KNEE ARTHROPLASTY Right 12/16/2019   Procedure: TOTAL KNEE ARTHROPLASTY;  Surgeon: Car Donnice, MD;  [facility];  Service: Orthopedics;  Laterality: Right;  70 mins   TRANSTHORACIC ECHOCARDIOGRAM  11/08/2005   EF 68%, normal LV systolic function   UPPER GASTROINTESTINAL ENDOSCOPY  2010   Negative, Dr.[name]   Family History  Problem Relation Age of Onset   Heart failure Mother    Hypertension Mother    Thyroid disease Mother    Cirrhosis Father    Diabetes Father    Stroke Father    Heart attack Brother 68   Aneurysm Maternal Aunt         AAA   Coronary artery disease Maternal Uncle        MI late 70s   Outpatient Medications Prior to Visit  Medication Sig Dispense Refill   amoxicillin  (AMOXIL ) 500 MG capsule Take 4 capsules (2,000 mg total) by mouth 30-60 minutes prior to dental appointment. 4 capsule 6   Ascorbic Acid  (VITAMIN C ) 1000 MG tablet Take 1,000 mg by mouth daily.     aspirin  81 MG chewable tablet      b complex vitamins tablet Take 2 tablets by mouth daily.      BEE POLLEN PO Take by mouth.     carboxymethylcellulose (REFRESH PLUS) 0.5 % SOLN INSTILL 1 DROP IN BOTH EYES FOUR TIMES A DAY     celecoxib  (CELEBREX ) 200 MG  capsule Take 1 capsule by mouth daily as needed.     cyanocobalamin  (VITAMIN B12) 500 MCG tablet 300- 500 mcg daily     diclofenac  Sodium (VOLTAREN ) 1 % GEL APPLY TO KNEE AND HIPS AS NEEDED 500 g 2   hydrochlorothiazide  (HYDRODIURIL ) 25 MG tablet Take 1 tablet (25 mg total) by mouth daily. 90 tablet 3   levothyroxine  (SYNTHROID ) 112 MCG tablet Take 2  tablets (224 mcg total) by mouth every morning. 60 tablet 0   losartan  (COZAAR ) 100 MG tablet TAKE ONE-HALF TABLET BY MOUTH ONCE A DAY     Multiple Vitamin (MULTIVITAMIN) tablet Take 1 tablet by mouth every morning.     omeprazole  (PRILOSEC) 40 MG capsule Take 1 capsule by mouth 2 (two) times daily.     Turmeric (QC TUMERIC COMPLEX PO) Take by mouth.     Vitamin D , Cholecalciferol , 25 MCG (1000 UT) CAPS 5,000 IU  daily     Alirocumab  (PRALUENT ) 75 MG/ML SOAJ Inject 1 mL (75 mg total) into the skin every 14 (fourteen) days. (Patient not taking: Reported on 01/15/2024) 2 mL 2   ACAI BERRY PO Take by mouth.     amoxicillin  (AMOXIL ) 500 MG capsule Take 4 capsules (2,000 mg total) by mouth 1 hour prior to appointment. 8 capsule PRN   cyclobenzaprine  (FLEXERIL ) 10 MG tablet Take 1 tablet 3 times a day by oral route as needed. 30 tablet 1   praziquantel  (BILTRICIDE ) 600 MG tablet Take one tablet by mouth three times daily for 1 day, repeat dose in 2-4 weeks (Patient taking differently: Take 600 mg by mouth. 2 time a year) 6 tablet 0   No facility-administered medications prior to visit.   Allergies  Allergen Reactions   Dilaudid  [Hydromorphone  Hcl] Nausea And Vomiting    Questionable as to whether it was actually dilaudid   That made me so severely nauseous and vomit   Lipitor [Atorvastatin ]     myalgia   Pitavastatin      Myalgia    Simvastatin      myalgia   Benazepril Hcl Cough     ROS: A complete ROS was performed with pertinent positives/negatives noted in the HPI. The remainder of the ROS are negative.    Objective:   Today's Vitals    01/15/24 1504  BP: 129/88  Pulse: 66  Temp: 98.2 F (36.8 C)  TempSrc: Oral  SpO2: 97%  Weight: 268 lb (121.6 kg)  Height: 5' 11 (1.803 m)    GENERAL: Well-appearing, in NAD. Well nourished.  SKIN: Pink, warm and dry.  Head: Normocephalic. NECK: Trachea midline. Full ROM w/o pain or tenderness. No lymphadenopathy.  EARS: Tympanic membranes are intact, mildly injected bilaterally without bulging and without drainage. Appropriate landmarks visualized.  EYES: Conjunctiva clear without exudates. EOMI, PERRL, no drainage present.  NOSE: Septum midline w/o deformity. Nares patent, mucosa pink and non-inflamed w/o drainage. No sinus tenderness.  THROAT: Uvula midline. Oropharynx clear. Mucous membranes pink and moist.  RESPIRATORY: Chest wall symmetrical. Respirations even and non-labored. Breath sounds mildly diminished to right lower lobe and clear to auscultation bilaterally. Cough is congested, mild tenderness with palpation to sternum.  CARDIAC: S1, S2 present, regular rate and rhythm without murmur or gallops. Peripheral pulses 2+ bilaterally.  MSK: Muscle tone and strength appropriate for age.  EXTREMITIES: Without clubbing, cyanosis, or edema.  NEUROLOGIC: No motor or sensory deficits. Steady, even gait. C2-C12 intact.  PSYCH/MENTAL STATUS: Alert, oriented x 3. Cooperative, appropriate mood and affect.      Assessment & Plan:  1. Acute cough (Primary) 2. Shortness of breath Rule out possible community-acquired pneumonia with chest x-ray today given fever, shortness of breath and mild chest discomfort with cough.  Will start patient on Z-Pak, Promethazine  DM for cough as needed and albuterol  inhaler.  If no improvement in 48 hours, will reach out to PCP. - azithromycin  (ZITHROMAX ) 250 MG tablet; Take 2 tablets on day 1,  then 1 tablet daily on days 2 through 5  Dispense: 6 tablet; Refill: 0 - promethazine -dextromethorphan (PROMETHAZINE -DM) 6.25-15 MG/5ML syrup; Take 5 mLs by mouth 4  (four) times daily as needed.  Dispense: 118 mL; Refill: 0 - DG Chest 2 View; Future  Meds ordered this encounter  Medications   azithromycin  (ZITHROMAX ) 250 MG tablet    Sig: Take 2 tablets on day 1, then 1 tablet daily on days 2 through 5    Dispense:  6 tablet    Refill:  0    Supervising Provider:   DE CUBA, RAYMOND J [[id number]]   promethazine -dextromethorphan (PROMETHAZINE -DM) 6.25-15 MG/5ML syrup    Sig: Take 5 mLs by mouth 4 (four) times daily as needed.    Dispense:  118 mL    Refill:  0    Supervising Provider:   DE CUBA, RAYMOND J [[id number]]   albuterol  (VENTOLIN  HFA) 108 (90 Base) MCG/ACT inhaler    Sig: Inhale 2 puffs into the lungs every 6 (six) hours as needed for wheezing or shortness of breath.    Dispense:  6.7 g    Refill:  2    Supervising Provider:   DE CUBA, RAYMOND J [[id number]]   Return if symptoms worsen or fail to improve.    Patient to reach out to office if new, worrisome, or unresolved symptoms arise or if no improvement in patient's condition. Patient verbalized understanding and is agreeable to treatment plan. All questions answered to patient's satisfaction.    Thersia Schuyler Stark, [place]

## 2024-01-17 ENCOUNTER — Ambulatory Visit (INDEPENDENT_AMBULATORY_CARE_PROVIDER_SITE_OTHER): Payer: No Typology Code available for payment source | Admitting: Family Medicine

## 2024-02-06 ENCOUNTER — Encounter (INDEPENDENT_AMBULATORY_CARE_PROVIDER_SITE_OTHER): Payer: Self-pay | Admitting: Family Medicine

## 2024-02-06 ENCOUNTER — Ambulatory Visit (INDEPENDENT_AMBULATORY_CARE_PROVIDER_SITE_OTHER): Payer: No Typology Code available for payment source | Admitting: Family Medicine

## 2024-02-06 VITALS — BP 124/83 | HR 61 | Temp 97.9°F | Ht 71.0 in | Wt 258.0 lb

## 2024-02-06 DIAGNOSIS — E538 Deficiency of other specified B group vitamins: Secondary | ICD-10-CM

## 2024-02-06 DIAGNOSIS — E782 Mixed hyperlipidemia: Secondary | ICD-10-CM

## 2024-02-06 DIAGNOSIS — E039 Hypothyroidism, unspecified: Secondary | ICD-10-CM | POA: Diagnosis not present

## 2024-02-06 DIAGNOSIS — E88819 Insulin resistance, unspecified: Secondary | ICD-10-CM | POA: Diagnosis not present

## 2024-02-06 DIAGNOSIS — E559 Vitamin D deficiency, unspecified: Secondary | ICD-10-CM | POA: Diagnosis not present

## 2024-02-06 DIAGNOSIS — Z6835 Body mass index (BMI) 35.0-35.9, adult: Secondary | ICD-10-CM

## 2024-02-06 DIAGNOSIS — E669 Obesity, unspecified: Secondary | ICD-10-CM

## 2024-02-06 DIAGNOSIS — Z6836 Body mass index (BMI) 36.0-36.9, adult: Secondary | ICD-10-CM

## 2024-02-06 NOTE — Progress Notes (Incomplete)
**Note Samuel-Identified via Obfuscation**  Samuel Goodwin, D.O.  ABFM, ABOM Specializing in Clinical Bariatric Medicine  Office located at: [address] [place], [place]  [address]   Assessment and Plan:   Orders Placed This Encounter  Procedures   Comprehensive metabolic panel   Lipid panel   Hemoglobin A1c   VITAMIN D 25 Hydroxy (Vit-D Deficiency, Fractures)   Vitamin B12   T4, free   TSH   Insulin, random     FOR THE DISEASE OF OBESITY: BMI 35.0-35.9,adult- current BMI 36.00 Obesity (BMI 30-39.9) - start BMI 37.39 Assessment & Plan: Since last office visit on 12/18/2023 patient's  Muscle mass has decreased by 1.6 lbs. Fat mass has increased by 4.6 lbs. Total body water has increased by 4.4 lbs.  Counseling done on how various foods will affect these numbers and how to maximize success  Total lbs lost to date: 9 lbs Total weight loss percentage to date: 3.37%    Recommended Dietary Goals Samuel Goodwin is currently in the action stage of change. As such, his goal is to continue weight management plan.  He has agreed to: Follow modified CAT 2 with 6 oz at lunch and 10 oz at dinner   Behavioral Intervention We discussed the following today: High protein/low carb pasta alternatives (e.g. edamame spaghetti or carba-nada pasta), low calories spaghetti sauce, {dowtlossstrategies:31654}  Additional resources provided today: Handout on non-starchy vegetables, handout on CAT 2 meal plan, Handout on CAT 1-2 breakfast options, and Handout on CAT 1-2 lunch options  Evidence-based interventions for health behavior change were utilized today including the discussion of self monitoring techniques, problem-solving barriers and SMART goal setting techniques.   Regarding patient's less desirable eating habits and patterns, we employed the technique of small changes.   Pt will specifically work on: *** for next visit.    Recommended Physical Activity Goals Samuel Goodwin has been Goodwin to work up to 150 minutes of moderate  intensity aerobic activity a week and strengthening exercises 2-3 times per week for cardiovascular health, weight loss maintenance and preservation of muscle mass.   He has agreed to :  {EMEXERCISE:28847::"Think about enjoyable ways to increase daily physical activity and overcoming barriers to exercise","Increase physical activity in their day and reduce sedentary time (increase NEAT)."}   Pharmacotherapy We both agreed to : continue with nutritional and behavioral strategies   FOR ASSOCIATED CONDITIONS ADDRESSED TODAY:  Insulin resistance Assessment & Plan: Not currently managing with any medications. Diet/exercise approach. Pt admits to struggling to stay on plan during recent illness. In the last 2 weeks, pt has been back on track. Continue to intentionally increase protein and decrease carbs. Will recheck A1c and fasting insulin today.         Mixed hyperlipidemia Assessment & Plan:    Managed by PCP   Vitamin D deficiency Assessment & Plan:    Cholecalciferol 25 mcg daily   Vitamin B12 deficiency Assessment & Plan:     Cyanocobalamin 500 mcg daily    Follow up:   No follow-ups on file.*** He was informed of the importance of frequent follow up visits to maximize his success with intensive lifestyle modifications for his multiple health conditions.  Subjective:   Chief complaint: Obesity Samuel Goodwin is here to discuss his progress with his obesity treatment plan. He is on the  Category 2 Plan with B and L options and keeping a food journal and adhering to recommended goals of 1450-1550 calories and 100+ protein with 8-10 oz at lunch and 6 oz at  dinner  and states he is following his eating plan approximately 60% of the time. He states he is not exercising d/t recent illness.  Interval History:  Samuel Goodwin is here for a follow up office visit. Since last OV on ,  ***     Was sick for about 6 wks Was given Z-Pak and taking OTC cold meds First  time symptoms improved were 4 days ago Samuel Goodwin Flesher to the pool over the weekend  Not getting enough protein in dinner Daughter getting married in June Wants to get back on track  Realizes when he is not exercising his eating is worse   Pharmacotherapy for weight loss: He is currently taking no anti-obesity medication.   Review of Systems:  Pertinent positives were addressed with patient today.  Reviewed by clinician on day of visit: allergies, medications, problem list, medical history, surgical history, family history, social history, and previous encounter notes.  Weight Summary and Biometrics   Weight Lost Since Last Visit: 0 lb  Weight Gained Since Last Visit: 4 lb   Vitals Temp: 97.9 F (36.6 C) BP: 124/83 Pulse Rate: 61 SpO2: 99 %   Anthropometric Measurements Height: 5\' 11"  (1.803 m) Weight: 258 lb (117 kg) BMI (Calculated): 36 Weight at Last Visit: 254 lb Weight Lost Since Last Visit: 0 lb Weight Gained Since Last Visit: 4 lb Starting Weight: 267 lb Total Weight Loss (lbs): 9 lb (4.082 kg) Peak Weight: 315 lb   Body Composition  Body Fat %: 36.7 % Fat Mass (lbs): 94.6 lbs Muscle Mass (lbs): 155.4 lbs Total Body Water (lbs): 115.8 lbs Visceral Fat Rating : 22   Other Clinical Data Fasting: no Labs: no Today's Visit #: 9 Starting Date: 04/25/23    Objective:   PHYSICAL EXAM: Blood pressure 124/83, pulse 61, temperature 97.9 F (36.6 C), height 5\' 11"  (1.803 m), weight 258 lb (117 kg), SpO2 99%. Body mass index is 35.98 kg/m.  General: he is overweight, cooperative and in no acute distress. PSYCH: Has normal mood, affect and thought process.   HEENT: EOMI, sclerae are anicteric. Lungs: Normal breathing effort, no conversational dyspnea. Extremities: Moves * 4 Neurologic: A and O * 3, good insight  DIAGNOSTIC DATA REVIEWED: BMET    Component Value Date/Time   NA 138 06/27/2023 1128   K 3.7 06/27/2023 1128   CL 100 06/27/2023 1128   CO2 23  06/27/2023 1128   GLUCOSE 86 06/27/2023 1128   GLUCOSE 91 08/23/2020 1723   BUN 20 06/27/2023 1128   CREATININE 0.75 (L) 06/27/2023 1128   CALCIUM 9.3 06/27/2023 1128   GFRNONAA >60 08/23/2020 1723   GFRAA >60 08/23/2020 1723   Lab Results  Component Value Date   HGBA1C 5.0 04/25/2023   HGBA1C 5.3 01/03/2010   Lab Results  Component Value Date   INSULIN 14.5 04/25/2023   Lab Results  Component Value Date   TSH 0.461 06/27/2023   CBC    Component Value Date/Time   WBC 5.3 04/25/2023 0854   WBC 5.7 08/23/2020 1723   RBC 4.84 04/25/2023 0854   RBC 4.54 08/23/2020 1723   HGB 14.5 04/25/2023 0854   HCT 42.2 04/25/2023 0854   PLT 219 04/25/2023 0854   MCV 87 04/25/2023 0854   MCH 30.0 04/25/2023 0854   MCH 29.3 08/23/2020 1723   MCHC 34.4 04/25/2023 0854   MCHC 34.6 08/23/2020 1723   RDW 12.2 04/25/2023 0854   Iron Studies No results found for: "IRON", "TIBC", "FERRITIN", "IRONPCTSAT"  Lipid Panel     Component Value Date/Time   CHOL 215 (H) 06/27/2023 1128   TRIG 102 06/27/2023 1128   HDL 52 06/27/2023 1128   CHOLHDL 4.1 06/27/2023 1128   CHOLHDL 3.2 11/03/2014 0800   VLDL 19 11/03/2014 0800   LDLCALC 145 (H) 06/27/2023 1128   Hepatic Function Panel     Component Value Date/Time   PROT 6.5 06/27/2023 1128   ALBUMIN 4.3 06/27/2023 1128   AST 12 06/27/2023 1128   ALT 16 06/27/2023 1128   ALKPHOS 62 06/27/2023 1128   BILITOT 0.7 06/27/2023 1128   BILIDIR 0.12 01/14/2021 0847   IBILI 0.4 11/03/2014 0800      Component Value Date/Time   TSH 0.461 06/27/2023 1128   Nutritional Lab Results  Component Value Date   VD25OH 59.1 04/25/2023   VD25OH 37 01/03/2010    Attestations:   I, Samuel Goodwin, acting as a Stage manager for [employer], DO., have compiled all relevant documentation for today's office visit on behalf of Samuel Lot, DO, while in the presence of [employer], DO.  Reviewed by clinician on day of visit: allergies, medications,  problem list, medical history, surgical history, family history, social history, and previous encounter notes pertinent to patient's obesity diagnosis. I have spent 40   Samuel Goodwin, D.O.  ABFM, ABOM Specializing in Clinical Bariatric Medicine Office located at: [address] [place], [place]  [address]   Bariatric Medicine Visit  Dear Samuel Goodwin, Samuel Kos, MD   Thank you for referring Samuel Goodwin to our clinic today for evaluation.  We performed a consultation to discuss his options for treatment and educate the patient on his disease state.  The following note includes my evaluation and treatment recommendations.   Please do not hesitate to reach out to me directly if you have any further concerns.   Assessment and Plan:   FOR THE DISEASE OF OBESITY:   Insulin resistance -     Hemoglobin A1c -     T4, free -     TSH -     Insulin, random  Mixed hyperlipidemia -     Comprehensive metabolic panel -     Lipid panel  Vitamin D deficiency -     VITAMIN D 25 Hydroxy (Vit-D Deficiency, Fractures)  Vitamin B12 deficiency -     Vitamin B12  BMI 35.0-35.9,adult- current BMI 36.00  Obesity (BMI 30-39.9) - start BMI 37.39       Since last office visit on *** patient's  Muscle mass has {DID:29233} by ***lb. Fat mass has {DID:29233} by ***lb. Total body water has {DID:29233} by ***lb.  Counseling done on how various foods will affect these numbers and how to maximize success  Total lbs lost to date: *** Total weight loss percentage to date: ***    Recommended Dietary Goals Taran is currently in the action stage of change. As such, his goal is to start our weight management plan.  He has agreed to: {EMWTLOSSPLAN:29297::"continue current plan"}   Behavioral Intervention We discussed the following Behavioral Modification Strategies today: {dowtlossstrategies:31654}  Additional resources provided today: {DOhandouts:31655::"None"}  Evidence-based  interventions for health behavior change were utilized today including the discussion of self monitoring techniques, problem-solving barriers and SMART goal setting techniques.    Pt will specifically work on: *** for next visit.    Recommended Physical Activity Goals Samuel Goodwin to work up to 150 minutes of moderate intensity aerobic activity a week  and strengthening exercises 2-3 times per week for cardiovascular health, weight loss maintenance and preservation of muscle mass.   He has agreed to : maintain current level of activity.    Pharmacotherapy We discussed various medication options to help Samuel Goodwin with his weight loss efforts and we both agreed to : {EMagreedrx:29170}   FOR ASSOCIATED CONDITIONS ADDRESSED TODAY:  Insulin resistance -     Hemoglobin A1c -     T4, free -     TSH -     Insulin, random  Mixed hyperlipidemia -     Comprehensive metabolic panel -     Lipid panel  Vitamin D deficiency -     VITAMIN D 25 Hydroxy (Vit-D Deficiency, Fractures)  Vitamin B12 deficiency -     Vitamin B12  BMI 35.0-35.9,adult- current BMI 36.00  Obesity (BMI 30-39.9) - start BMI 37.39    Fatigue Assessment & Plan: Samuel Goodwin {DOES_DOES ZOX:09604} feel that his weight is causing his energy to be lower than it should be. Fatigue may be related to obesity, depression or many other causes. he does not appear to have any red flag symptoms and this appears to most likely be related to his current lifestyle habits and dietary intake.  Labs will be ordered and reviewed with him at their next office visit in two weeks.  Epworth sleepiness scale is *** and appears to be OR not be (delete one) within normal limits. Samuel Goodwin {Actions; denies/reports/admits to:19208} daytime somnolence and {Actions; denies/reports/admits to:19208} waking up still tired. Patient has a history of symptoms of {OSA Sx:17850}. Nasim generally gets {numbers (fuzzy):14653} hours of sleep per night, and states  that he has {sleep quality:17851}. Snoring {is/are not:32546} present. Apneic episodes {is/are not:32546} present.   ECG: Performed and reviewed/ interpreted independently.  Normal sinus rhythm, rate ***bpm; reassuring without any acute abnormalities, will continue to monitor for symptoms    Shortness of breath on exertion Assessment & Plan: Samuel Goodwin {DOES_DOES VWU:98119} feel that he gets out of breath more easily than he used to when he exercises and seems to be worsening over time with weight gain.  This {HAS HAS JYN:82956} gotten worse recently. Irwin denies shortness of breath at rest or orthopnea. Samuel Goodwin shortness of breath appears to be obesity related and exercise induced, as they do not appear to have any "red flag" symptoms/ concerns today.  Also, this condition appears to be related to a state of poor cardiovascular conditioning   Obtain labs today and will be reviewed with him at their next office visit in two weeks.  Indirect Calorimeter completed today to help guide our dietary regimen. It shows a VO2 of *** and a REE of ***.  His calculated basal metabolic rate is *** thus his resting energy expenditure is {DESC; BETTER/WORSE:18575} than expected.  Patient agreed to work on weight loss at this time.  As Samuel Goodwin progresses through our weight loss program, we will gradually increase exercise as tolerated to treat his current condition.   If Davin follows our recommendations and loses 5-10% of their weight without improvement of his shortness of breath or if at any time, symptoms become more concerning, they agree to urgently follow up with their PCP/ specialist for further consideration/ evaluation.   Samuel Goodwin verbalizes agreement with this plan.    Depression Screen  Assessment & Plan: His Food and Mood (modified PHQ-9) score was ***.    Insulin resistance -     Hemoglobin A1c -     T4, free -  TSH -     Insulin, random  Mixed hyperlipidemia -     Comprehensive metabolic  panel -     Lipid panel  Vitamin D deficiency -     VITAMIN D 25 Hydroxy (Vit-D Deficiency, Fractures)  Vitamin B12 deficiency -     Vitamin B12  BMI 35.0-35.9,adult- current BMI 36.00  Obesity (BMI 30-39.9) - start BMI 37.39     FOLLOW UP:   Follow up in 2 weeks. He was informed of the importance of frequent follow up visits to maximize his success with intensive lifestyle modifications for his multiple health conditions.  Kenner Lewan is aware that we will review all of his lab results at our next visit.  He is aware that if anything is critical/ life threatening with the results, we will be contacting him via MyChart prior to the office visit to discuss management.    Chief Complaint:   OBESITY Emaad Nanna (MR# [id number]) is a pleasant 63 y.o. male who presents for evaluation and treatment of obesity and related comorbidities. Current BMI is Body mass index is 35.98 kg/m. Gevorg Brum has been struggling with his weight for many years and has been unsuccessful in either losing weight, maintaining weight loss, or reaching his healthy weight goal.  Torris House is currently in the action stage of change and ready to dedicate time achieving and maintaining a healthier weight. Cammeron Greis is interested in becoming our patient and working on intensive lifestyle modifications including (but not limited to) diet and exercise for weight loss.  Newman Nickels works as a ***. Patient {relationshipstatus:29186} and {HAS/DOES NOT WUJW:11914} children. He lives with ***.   ***   Subjective:   This is the patient's first visit at [facility].  The patient's NEW PATIENT PACKET that they filled out prior to today's office visit was reviewed at length and information from that paperwork was included within the following office visit note.    Included in the packet: current and past health history, medications, allergies, ROS,  gynecologic history (women only), surgical history, family history, social history, weight history, weight loss surgery history (for those that have had weight loss surgery), nutritional evaluation, mood and food questionnaire along with a depression screening (PHQ9) on all patients, an Epworth questionnaire, sleep habits questionnaire, patient life and health improvement goals questionnaire. These will all be scanned into the patient's chart under the "media" tab.   Review of Systems: Please refer to new patient packet scanned into media. Pertinent positives were addressed with patient today.  Reviewed by clinician on day of visit: allergies, medications, problem list, medical history, surgical history, family history, social history, and previous encounter notes.  During the visit, I independently reviewed the patient's EKG, bioimpedance scale results, and indirect calorimeter results. I used this information to tailor a meal plan for the patient that will help Samuel Goodwin to lose weight and will improve his obesity-related conditions going forward.  I performed a medically necessary appropriate examination and/or evaluation. I discussed the assessment and treatment plan with the patient. The patient was provided an opportunity to ask questions and all were answered. The patient agreed with the plan and demonstrated an understanding of the instructions. Labs were ordered today (unless patient declined them) and will be reviewed with the patient at our next visit unless more critical results need to be addressed immediately. Clinical information was updated and documented in the EMR.    Objective:  PHYSICAL EXAM: Blood pressure 124/83, pulse 61, temperature 97.9 F (36.6 C), height 5\' 11"  (1.803 m), weight 258 lb (117 kg), SpO2 99%. Body mass index is 35.98 kg/m.  General: Well Developed, well nourished, and in no acute distress.  HEENT: Normocephalic, atraumatic; EOMI, sclerae are  anicteric. Skin: Warm and dry, good turgor Chest:  Normal excursion, shape, no gross ABN Respiratory: No conversational dyspnea; speaking in full sentences NeuroM-Sk:  Normal gross ROM * 4 extremities  Psych: A and O *3, insight adequate, mood- full   No data recorded No data recorded No data recorded  DIAGNOSTIC DATA REVIEWED:  BMET    Component Value Date/Time   NA 141 02/06/2024 0842   K 4.2 02/06/2024 0842   CL 102 02/06/2024 0842   CO2 26 02/06/2024 0842   GLUCOSE 94 02/06/2024 0842   GLUCOSE 91 08/23/2020 1723   BUN 20 02/06/2024 0842   CREATININE 0.74 (L) 02/06/2024 0842   CALCIUM 9.0 02/06/2024 0842   GFRNONAA >60 08/23/2020 1723   GFRAA >60 08/23/2020 1723   Lab Results  Component Value Date   HGBA1C 5.0 02/06/2024   HGBA1C 5.3 01/03/2010   Lab Results  Component Value Date   INSULIN 11.5 02/06/2024   INSULIN 14.5 04/25/2023   Lab Results  Component Value Date   TSH 1.110 02/06/2024   CBC    Component Value Date/Time   WBC 5.3 04/25/2023 0854   WBC 5.7 08/23/2020 1723   RBC 4.84 04/25/2023 0854   RBC 4.54 08/23/2020 1723   HGB 14.5 04/25/2023 0854   HCT 42.2 04/25/2023 0854   PLT 219 04/25/2023 0854   MCV 87 04/25/2023 0854   MCH 30.0 04/25/2023 0854   MCH 29.3 08/23/2020 1723   MCHC 34.4 04/25/2023 0854   MCHC 34.6 08/23/2020 1723   RDW 12.2 04/25/2023 0854   Iron Studies No results found for: "IRON", "TIBC", "FERRITIN", "IRONPCTSAT" Lipid Panel     Component Value Date/Time   CHOL 245 (H) 02/06/2024 0842   TRIG 70 02/06/2024 0842   HDL 54 02/06/2024 0842   CHOLHDL 4.5 02/06/2024 0842   CHOLHDL 3.2 11/03/2014 0800   VLDL 19 11/03/2014 0800   LDLCALC 179 (H) 02/06/2024 0842   Hepatic Function Panel     Component Value Date/Time   PROT 6.5 02/06/2024 0842   ALBUMIN 4.3 02/06/2024 0842   AST 20 02/06/2024 0842   ALT 16 02/06/2024 0842   ALKPHOS 67 02/06/2024 0842   BILITOT 0.5 02/06/2024 0842   BILIDIR 0.12 01/14/2021 0847    IBILI 0.4 11/03/2014 0800      Component Value Date/Time   TSH 1.110 02/06/2024 0842   Nutritional Lab Results  Component Value Date   VD25OH 114.0 (H) 02/06/2024   VD25OH 59.1 04/25/2023   VD25OH 37 01/03/2010    Attestation Statements:   I, ***, acting as a Stage manager for Samuel Lot, DO., have compiled all relevant documentation for today's office visit on behalf of Samuel Lot, DO, while in the presence of [employer], DO.  Reviewed by clinician on day of visit: allergies, medications, problem list, medical history, surgical history, family history, social history, and previous encounter notes pertinent to patient's obesity diagnosis. I have spent 60 *** minutes in the care of the patient today including: preparing to see patient (e.g. review and interpretation of tests, old notes ), obtaining and/or reviewing separately obtained history, performing a medically appropriate examination or evaluation, counseling and educating the patient, ordering medications, test or  procedures, documenting clinical information in the electronic or other health care record, and independently interpreting results and communicating results to the patient, family, or caregiver   I have reviewed the above documentation for accuracy and completeness, and I agree with the above. Samuel Goodwin, D.O.  The 21st Century Cures Act was signed into law in 2016 which includes the topic of electronic health records.  This provides immediate access to information in MyChart.  This includes consultation notes, operative notes, office notes, lab results and pathology reports.  If you have any questions about what you read please let us know at your next visit so we can discuss your concerns and take corrective action if need be.  We are right here with you.minutes in the care of the patient today including: preparing to see patient (e.g. review and interpretation of tests, old notes ), obtaining and/or  reviewing separately obtained history, performing a medically appropriate examination or evaluation, counseling and educating the patient, ordering medications, test or procedures, documenting clinical information in the electronic or other health care record, and independently interpreting results and communicating results to the patient, family, or caregiver   I have reviewed the above documentation for accuracy and completeness, and I agree with the above. Samuel Goodwin, D.O.  The 21st Century Cures Act was signed into law in 2016 which includes the topic of electronic health records.  This provides immediate access to information in MyChart.  This includes consultation notes, operative notes, office notes, lab results and pathology reports.  If you have any questions about what you read please let us know at your next visit so we can discuss your concerns and take corrective action if need be.  We are right here with you.

## 2024-02-07 LAB — LIPID PANEL
Chol/HDL Ratio: 4.5 {ratio} (ref 0.0–5.0)
Cholesterol, Total: 245 mg/dL — ABNORMAL HIGH (ref 100–199)
HDL: 54 mg/dL (ref >39)
LDL Chol Calc (NIH): 179 mg/dL — ABNORMAL HIGH (ref 0–99)
Triglycerides: 70 mg/dL (ref 0–149)
VLDL Cholesterol Cal: 12 mg/dL (ref 5–40)

## 2024-02-07 LAB — COMPREHENSIVE METABOLIC PANEL
ALT: 16 [IU]/L (ref 0–44)
AST: 20 [IU]/L (ref 0–40)
Albumin: 4.3 g/dL (ref 3.9–4.9)
Alkaline Phosphatase: 67 [IU]/L (ref 44–121)
BUN/Creatinine Ratio: 27 — ABNORMAL HIGH (ref 10–24)
BUN: 20 mg/dL (ref 8–27)
Bilirubin Total: 0.5 mg/dL (ref 0.0–1.2)
CO2: 26 mmol/L (ref 20–29)
Calcium: 9 mg/dL (ref 8.6–10.2)
Chloride: 102 mmol/L (ref 96–106)
Creatinine, Ser: 0.74 mg/dL — ABNORMAL LOW (ref 0.76–1.27)
Globulin, Total: 2.2 g/dL (ref 1.5–4.5)
Glucose: 94 mg/dL (ref 70–99)
Potassium: 4.2 mmol/L (ref 3.5–5.2)
Sodium: 141 mmol/L (ref 134–144)
Total Protein: 6.5 g/dL (ref 6.0–8.5)
eGFR: 102 mL/min/{1.73_m2} (ref >59)

## 2024-02-07 LAB — HEMOGLOBIN A1C
Est. average glucose Bld gHb Est-mCnc: 97 mg/dL
Hgb A1c MFr Bld: 5 % (ref 4.8–5.6)

## 2024-02-07 LAB — TSH: TSH: 1.11 u[IU]/mL (ref 0.450–4.500)

## 2024-02-07 LAB — INSULIN, RANDOM: INSULIN: 11.5 u[IU]/mL (ref 2.6–24.9)

## 2024-02-07 LAB — T4, FREE: Free T4: 2.04 ng/dL — ABNORMAL HIGH (ref 0.82–1.77)

## 2024-02-07 LAB — VITAMIN D 25 HYDROXY (VIT D DEFICIENCY, FRACTURES): Vit D, 25-Hydroxy: 114 ng/mL — ABNORMAL HIGH (ref 30.0–100.0)

## 2024-02-07 LAB — VITAMIN B12: Vitamin B-12: 1550 pg/mL — ABNORMAL HIGH (ref 232–1245)

## 2024-02-08 NOTE — Progress Notes (Signed)
 Samuel Goodwin, D.O.  ABFM, ABOM Specializing in Clinical Bariatric Medicine  Office located at: [address] [place], [place]  [address]   Assessment and Plan:  Labs obtained today (insulin, TSH, T4 free, B12, Vit D, A1c, lipid panel, and CMP) will be reviewed at next OV. Orders Placed This Encounter  Procedures   Comprehensive metabolic panel   Lipid panel   Hemoglobin A1c   VITAMIN D 25 Hydroxy (Vit-D Deficiency, Fractures)   Vitamin B12   T4, free   TSH   Insulin, random     FOR THE DISEASE OF OBESITY: BMI 35.0-35.9,adult- current BMI 36.00 Obesity (BMI 30-39.9) - start BMI 37.39 Assessment & Plan: Since last office visit on 12/18/2023 patient's  Muscle mass has decreased by 1.6 lbs. Fat mass has increased by 4.6 lbs. Total body water has increased by 4.4 lbs.  Counseling done on how various foods will affect these numbers and how to maximize success  Total lbs lost to date: 9 lbs Total weight loss percentage to date: 3.37%    Recommended Dietary Goals Samuel Goodwin is currently in the action stage of change. As such, his goal is to continue weight management plan.  He has agreed to: Follow modified CAT 2 with 6 oz at lunch and 10 oz at dinner  Behavioral Intervention We discussed the following today: High protein/low carb pasta alternatives (e.g. edamame spaghetti or carba-nada pasta), low calorie spaghetti sauce  Additional resources provided today: Handout on non-starchy vegetables, handout on CAT 2 meal plan, Handout on CAT 1-2 breakfast options, and Handout on CAT 1-2 lunch options  Evidence-based interventions for health behavior change were utilized today including the discussion of self monitoring techniques, problem-solving barriers and SMART goal setting techniques.   Regarding patient's less desirable eating habits and patterns, we employed the technique of small changes.   Pt will specifically work on: n/a   Recommended Physical Activity Goals Samuel Goodwin  has been advised to work up to 150 minutes of moderate intensity aerobic activity a week and strengthening exercises 2-3 times per week for cardiovascular health, weight loss maintenance and preservation of muscle mass.   He has agreed to :  Resume physical activity as normal    Pharmacotherapy We both agreed to : continue with nutritional and behavioral strategies   FOR ASSOCIATED CONDITIONS ADDRESSED TODAY:  Insulin resistance Assessment & Plan: Not currently managed with any medications. Diet/exercise approach. Pt admits to struggling to stay on plan during recent illness only.   In the last 2 weeks, pt has been back on track.  Continue to intentionally increase protein and decrease carbs--> follow our PNP.  Will recheck A1c, thyroid levels, and fasting insulin today.  Relevant Orders: -     Hemoglobin A1c -     Insulin, random     Hypothyroidism, unspecified type Pt's [facility].  They recently have been actively changing doses - lowering them d/t increased FT4.  Pt is ASX, no concerns of palpitations, sweating or HBP.  Since we are getting labs today and pt endorses it's been approx 6-7 wks since last dose change by PCP, will check levels today. - T4, free - TSH  Mixed hyperlipidemia Assessment & Plan: MGT: per Cards- Dr Allyson Sabal.  Praluent q 2 wks.  Will get lipid and comprehensive metabolic panel today. Will continue to monitor condition alongside PCP/ specialists   Samuel Goodwin agrees to continue with our treatment plan of a heart-heathy, low sat/ trans fat cholesterol  meal plan and engage in 300-450 minutes exercise wkly.  Relevant Orders: -     Comprehensive metabolic panel -     Lipid panel   Vitamin D deficiency Assessment & Plan: Vit D deficiency treated with Cholecalciferol 5000IU daily.  Educated pt that weight loss will likely improve availability of vitamin D and close observation of vit D levels are needed during wt loss .   I encouraged Samuel Goodwin to continue  with meal plan and their weight loss efforts to further improve this condition. Will recheck levels today and adjust prn.  Relevant Orders: -     VITAMIN D 25 Hydroxy (Vit-D Deficiency, Fractures)   Vitamin B12 deficiency Assessment & Plan:  treating condition with Cyanocobalamin 500 mcg daily. Continue their prudent nutritional plan and focus on b12 rich foods such as lean red meats; poultry; eggs; seafood; beans, peas, and lentils; nuts and seeds; and soy products.  Will recheck levels today.  Relevant Orders: -     Vitamin B12    Follow up:   Return in about 5 weeks (around 03/13/2024 with me to review labs )   He was informed of the importance of frequent follow up visits to maximize his success with intensive lifestyle modifications for his multiple health conditions.  Samuel Goodwin is aware that we will review all of his lab results at our next visit together in person.  He is also aware that if anything is critical/ life threatening with the results, we will be contacting him via MyChart or by my CMA will be calling them prior to the office visit to discuss any urgent acute management.       Subjective:   Chief complaint: Obesity Samuel Goodwin is here to discuss his progress with his obesity treatment plan. He is on the  Category 2 Plan with B and L options ( 8oz)  and keeping a food journal and adhering to recommended goals of 1450-1550 calories and 100+ protein with 8-10 oz at lunch and 6 oz at dinner  and states he is following his eating plan approximately 60% of the time. He states he is not exercising d/t recent illness.  Interval History:  Samuel Goodwin is here for a follow up office visit. Since last OV on 12/18/2023, Samuel Goodwin is up 4 lbs. He reports being sick for about 6 weeks. He was given Z-Pak and was taking OTC cold medications. The first time his sx improved were 4 days ago and he went to the pool over the weekend. Samuel Goodwin admits that he realizes when he is not  exercising, his eating habits are worse. He has not been getting in enough protein at dinner. Furthermore, his daughter is getting married in June which has given him extra motivation and he wants to get back on track.    Pharmacotherapy for weight loss: He is currently taking no anti-obesity medication.   Review of Systems:  Pertinent positives were addressed with patient today.  Reviewed by clinician on day of visit: allergies, medications, problem list, medical history, surgical history, family history, social history, and previous encounter notes.  Weight Summary and Biometrics  Weight Lost Since Last Visit: 0 lb   Weight Gained Since Last Visit: 4 lb     Vitals Temp: 97.9 F (36.6 C) BP: 124/83 Pulse Rate: 61 SpO2: 99 %     Anthropometric Measurements Height: 5\' 11"  (1.803 m) Weight: 258 lb (117 kg) BMI (Calculated): 36 Weight at Last Visit: 254 lb Weight Lost Since Last  Visit: 0 lb Weight Gained Since Last Visit: 4 lb Starting Weight: 267 lb Total Weight Loss (lbs): 9 lb (4.082 kg) Peak Weight: 315 lb     Body Composition  Body Fat %: 36.7 % Fat Mass (lbs): 94.6 lbs Muscle Mass (lbs): 155.4 lbs Total Body Water (lbs): 115.8 lbs Visceral Fat Rating : 22     Other Clinical Data Fasting: no Labs: no Today's Visit #: 9 Starting Date: 04/25/23   Objective:   PHYSICAL EXAM: Blood pressure 124/83, pulse 61, temperature 97.9 F (36.6 C), height 5\' 11"  (1.803 m), weight 258 lb (117 kg), SpO2 99%. Body mass index is 35.98 kg/m.  General: he is overweight, cooperative and in no acute distress. PSYCH: Has normal mood, affect and thought process.   HEENT: EOMI, sclerae are anicteric. Lungs: Normal breathing effort, no conversational dyspnea. Extremities: Moves * 4 Neurologic: A and O * 3, good insight  DIAGNOSTIC DATA REVIEWED: BMET    Component Value Date/Time   NA 141 02/06/2024 0842   K 4.2 02/06/2024 0842   CL 102 02/06/2024 0842   CO2 26  02/06/2024 0842   GLUCOSE 94 02/06/2024 0842   GLUCOSE 91 08/23/2020 1723   BUN 20 02/06/2024 0842   CREATININE 0.74 (L) 02/06/2024 0842   CALCIUM 9.0 02/06/2024 0842   GFRNONAA >60 08/23/2020 1723   GFRAA >60 08/23/2020 1723   Lab Results  Component Value Date   HGBA1C 5.0 02/06/2024   HGBA1C 5.3 01/03/2010   Lab Results  Component Value Date   INSULIN 11.5 02/06/2024   INSULIN 14.5 04/25/2023   Lab Results  Component Value Date   TSH 1.110 02/06/2024   CBC    Component Value Date/Time   WBC 5.3 04/25/2023 0854   WBC 5.7 08/23/2020 1723   RBC 4.84 04/25/2023 0854   RBC 4.54 08/23/2020 1723   HGB 14.5 04/25/2023 0854   HCT 42.2 04/25/2023 0854   PLT 219 04/25/2023 0854   MCV 87 04/25/2023 0854   MCH 30.0 04/25/2023 0854   MCH 29.3 08/23/2020 1723   MCHC 34.4 04/25/2023 0854   MCHC 34.6 08/23/2020 1723   RDW 12.2 04/25/2023 0854   Iron Studies No results found for: "IRON", "TIBC", "FERRITIN", "IRONPCTSAT" Lipid Panel     Component Value Date/Time   CHOL 245 (H) 02/06/2024 0842   TRIG 70 02/06/2024 0842   HDL 54 02/06/2024 0842   CHOLHDL 4.5 02/06/2024 0842   CHOLHDL 3.2 11/03/2014 0800   VLDL 19 11/03/2014 0800   LDLCALC 179 (H) 02/06/2024 0842   Hepatic Function Panel     Component Value Date/Time   PROT 6.5 02/06/2024 0842   ALBUMIN 4.3 02/06/2024 0842   AST 20 02/06/2024 0842   ALT 16 02/06/2024 0842   ALKPHOS 67 02/06/2024 0842   BILITOT 0.5 02/06/2024 0842   BILIDIR 0.12 01/14/2021 0847   IBILI 0.4 11/03/2014 0800      Component Value Date/Time   TSH 1.110 02/06/2024 0842   Nutritional Lab Results  Component Value Date   VD25OH 114.0 (H) 02/06/2024   VD25OH 59.1 04/25/2023   VD25OH 37 01/03/2010    Attestations:   I, Camryn Mix, acting as a Stage manager for [employer], DO., have compiled all relevant documentation for today's office visit on behalf of Thomasene Lot, DO, while in the presence of [employer], DO.   I  have reviewed the above documentation for accuracy and completeness, and I agree with the above. Ezzard Standing Maija Biggers, D.O.  The 21st Century Cures Act was signed into law in 2016 which includes the topic of electronic health records.  This provides immediate access to information in MyChart.  This includes consultation notes, operative notes, office notes, lab results and pathology reports.  If you have any questions about what you read please let us know at your next visit so we can discuss your concerns and take corrective action if need be.  We are right here with you.

## 2024-02-21 MED ORDER — CYANOCOBALAMIN 500 MCG PO TABS: ORAL_TABLET | ORAL | Status: AC

## 2024-02-22 ENCOUNTER — Ambulatory Visit
Payer: No Typology Code available for payment source | Attending: Cardiovascular Disease | Admitting: Cardiovascular Disease

## 2024-02-22 ENCOUNTER — Encounter: Payer: Self-pay | Admitting: Cardiovascular Disease

## 2024-02-22 VITALS — BP 126/78 | HR 63 | Ht 72.0 in | Wt 266.0 lb

## 2024-02-22 DIAGNOSIS — G4733 Obstructive sleep apnea (adult) (pediatric): Secondary | ICD-10-CM

## 2024-02-22 DIAGNOSIS — I10 Essential (primary) hypertension: Secondary | ICD-10-CM | POA: Diagnosis not present

## 2024-02-22 DIAGNOSIS — R931 Abnormal findings on diagnostic imaging of heart and coronary circulation: Secondary | ICD-10-CM

## 2024-02-22 DIAGNOSIS — I7121 Aneurysm of the ascending aorta, without rupture: Secondary | ICD-10-CM

## 2024-02-22 DIAGNOSIS — E782 Mixed hyperlipidemia: Secondary | ICD-10-CM

## 2024-02-22 NOTE — Assessment & Plan Note (Signed)
 History of hyperlipidemia intolerant to statin therapy.  With are going to prescribe Praluent which she has yet to start.  His most recent lipid profile performed 2/26-25 revealed a total cholesterol 245, LDL 179 and HDL of 54.  LDL goal less than 70 given his elevated coronary calcium score.

## 2024-02-22 NOTE — Assessment & Plan Note (Signed)
 History of essential hypertension her blood pressure measured today at 126/78.  He is on losartan, and hydrochlorothiazide.

## 2024-02-22 NOTE — Assessment & Plan Note (Signed)
 Mild ascending thoracic aortic dilatation by 2D echo performed 07/09/2023 measuring 42 mm.  This will be rechecked on an annual basis.

## 2024-02-22 NOTE — Assessment & Plan Note (Signed)
 Elevated coronary calcium score of 319 measured 09/07/2019 distributed through all 3 coronary arteries.  He is asymptomatic.  We are aggressively addressing his risk factors.

## 2024-02-22 NOTE — Assessment & Plan Note (Signed)
 History of obstructive sleep apnea on CPAP.

## 2024-02-22 NOTE — Progress Notes (Signed)
 02/22/2024 Samuel Goodwin   1961/02/15  [id number]  Primary Physician de Goodwin, Samuel J, MD Primary Cardiologist: Samuel Gess MD Samuel Goodwin, Samuel Goodwin, [place]  HPI:  Samuel Goodwin is a 63 y.o.  moderately overweight, married Caucasian male, father of 3 who I last saw 06/12/2023.  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  Since I saw him last he has changed jobs and currently is working only in [place].  He has a strong family history for heart disease along with hypertension and hyperlipidemia. He has obstructive sleep apnea on CPAP which he benefits from.[name] He is trying to lose weight with marginal success. 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 He also has had throat cancer surgically addressed at [facility]. He was complaining of atypical chest pain  in the past and  had a negative Myoview stress test performed 08/29/2016.   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  He was unable to exercise unfortunately has gained weight as a result of that.  He also needs a right total knee replacement scheduled to be performed after the first the year by Samuel Goodwin .  He did recently take a statin holiday at the recommendation of his PCP which resulted in marked improvement in his back and thigh symptoms.   Since I saw him in the office 8 months ago he continues to do well.  He has gained a little weight since I last saw him but is still participating in the diet wellness center with Samuel Goodwin with intent to lose additional 40 pounds.  He denies chest pain or shortness of breath.  He has yet to start Apprilon to, alternative to Repatha from the [facility], given his recent lipid profile performed 02/06/2024 revealing total cholesterol of 245, LDL of 179 and HDL of 54.  He started to walk now  that the weather is nicer and goes to the pool 2 to 3 days a week.     Current Meds  Medication Sig   albuterol (VENTOLIN HFA) 108 (90 Base) MCG/ACT inhaler Inhale 2 puffs into the lungs every 6 (six) hours as needed for wheezing or shortness of breath.   amoxicillin (AMOXIL) 500 MG capsule Take 4 capsules (2,000 mg total) by mouth 30-60 minutes prior to dental appointment.   Ascorbic Acid (VITAMIN C) 1000 MG tablet Take 1,000 mg by mouth daily.   aspirin 81 MG chewable tablet    b complex vitamins tablet Take 2 tablets by mouth daily.    BEE POLLEN PO Take by mouth.   carboxymethylcellulose (REFRESH PLUS) 0.5 % SOLN INSTILL 1 DROP IN BOTH EYES FOUR TIMES A DAY   celecoxib (CELEBREX) 200 MG capsule Take 1 capsule by mouth daily as needed.   cyanocobalamin (VITAMIN B12) 500 MCG tablet 300- 500 mcg every other daily   diclofenac Sodium (VOLTAREN) 1 % GEL APPLY TO KNEE AND HIPS AS NEEDED   hydrochlorothiazide (HYDRODIURIL) 25 MG tablet Take 1 tablet (25 mg total) by mouth daily.   levothyroxine (SYNTHROID) 112 MCG tablet Take 2 tablets (224 mcg total) by mouth every morning.   losartan (COZAAR) 100 MG tablet TAKE ONE-HALF TABLET BY MOUTH ONCE A DAY   Multiple Vitamin (MULTIVITAMIN) tablet Take 1 tablet by mouth every morning.  omeprazole (PRILOSEC) 40 MG capsule Take 1 capsule by mouth 2 (two) times daily.   Turmeric (QC TUMERIC COMPLEX PO) Take by mouth.     Allergies  Allergen Reactions   Dilaudid [Hydromorphone Hcl] Nausea And Vomiting    "Questionable as to whether it was actually dilaudid  That made me so severely nauseous and vomit"   Lipitor [Atorvastatin]     myalgia   Pitavastatin     Myalgia    Simvastatin     myalgia   Benazepril Hcl Cough    Social History   Socioeconomic History   Marital status: Married    Spouse name: Samuel Goodwin   Number of children: 3   Years of education: 12   Highest education level: Associate degree: occupational, Scientist, product/process development, or vocational  program  Occupational History   Not on file  Tobacco Use   Smoking status: Never   Smokeless tobacco: Never   Tobacco comments:    Quit at age 48  Vaping Use   Vaping status: Never Used  Substance and Sexual Activity   Alcohol use: Not Currently    Comment: alcoholism quit 30 years ago    Drug use: Not Currently   Sexual activity: Yes  Other Topics Concern   Not on file  Social History Narrative   Lives with wife   No caffeine   Social Drivers of Corporate investment banker Strain: Low Risk  (01/15/2024)   Overall Financial Resource Strain (CARDIA)    Difficulty of Paying Living Expenses: Not hard at all  Food Insecurity: No Food Insecurity (01/15/2024)   Hunger Vital Sign    Worried About Running Out of Food in the Last Year: Never true    Ran Out of Food in the Last Year: Never true  Transportation Needs: No Transportation Needs (01/15/2024)   PRAPARE - Administrator, Civil Service (Medical): No    Lack of Transportation (Non-Medical): No  Physical Activity: Sufficiently Active (01/15/2024)   Exercise Vital Sign    Days of Exercise per Week: 7 days    Minutes of Exercise per Session: 50 min  Stress: No Stress Concern Present (01/15/2024)   [employer] of Occupational Health - Occupational Stress Questionnaire    Feeling of Stress : Not at all  Social Connections: Moderately Isolated (01/15/2024)   Social Connection and Isolation Panel [NHANES]    Frequency of Communication with Friends and Family: More than three times a week    Frequency of Social Gatherings with Friends and Family: Never    Attends Religious Services: Never    Database administrator or Organizations: No    Attends Banker Meetings: Never    Marital Status: Married  Catering manager Violence: Not At Risk (01/15/2024)   Humiliation, Afraid, Rape, and Kick questionnaire    Fear of Current or Ex-Partner: No    Emotionally Abused: No    Physically Abused: No    Sexually Abused: No      Review of Systems: General: negative for chills, fever, night sweats or weight changes.  Cardiovascular: negative for chest pain, dyspnea on exertion, edema, orthopnea, palpitations, paroxysmal nocturnal dyspnea or shortness of breath Dermatological: negative for rash Respiratory: negative for cough or wheezing Urologic: negative for hematuria Abdominal: negative for nausea, vomiting, diarrhea, bright red blood per rectum, melena, or hematemesis Neurologic: negative for visual changes, syncope, or dizziness All other systems reviewed and are otherwise negative except as noted above.    Blood pressure 126/78,  pulse 63, height 6' (1.829 m), weight 266 lb (120.7 kg), SpO2 97%.  General appearance: alert and no distress Neck: no adenopathy, no carotid bruit, no JVD, supple, symmetrical, trachea midline, and thyroid not enlarged, symmetric, no tenderness/mass/nodules Lungs: clear to auscultation bilaterally Heart: regular rate and rhythm, S1, S2 normal, no murmur, click, rub or gallop Extremities: extremities normal, atraumatic, no cyanosis or edema Pulses: 2+ and symmetric Skin: Skin color, texture, turgor normal. No rashes or lesions Neurologic: Grossly normal  EKG EKG Interpretation Date/Time:  Friday February 22 2024 07:55:11 EDT Ventricular Rate:  63 PR Interval:  136 QRS Duration:  114 QT Interval:  412 QTC Calculation: 421 R Axis:   16  Text Interpretation: Normal sinus rhythm Normal ECG When compared with ECG of 06-Jun-2023 08:01, No significant change was found Confirmed by Nanetta Batty [phone]) on 02/22/2024 8:10:13 AM    ASSESSMENT AND PLAN:   HTN (hypertension) History of essential hypertension her blood pressure measured today at 126/78.  He is on losartan, and hydrochlorothiazide.  Sleep apnea History of obstructive sleep apnea on CPAP  Hyperlipidemia History of hyperlipidemia intolerant to statin therapy.  With are going to prescribe Praluent which she has  yet to start.  His most recent lipid profile performed 2/26-25 revealed a total cholesterol 245, LDL 179 and HDL of 54.  LDL goal less than 70 given his elevated coronary calcium score.  Elevated coronary artery calcium score Elevated coronary calcium score of 319 measured 09/07/2019 distributed through all 3 coronary arteries.  He is asymptomatic.  We are aggressively addressing his risk factors.  Thoracic aortic aneurysm (HCC) Mild ascending thoracic aortic dilatation by 2D echo performed 07/09/2023 measuring 42 mm.  This will be rechecked on an annual basis.     Samuel Gess MD FACP,FACC,FAHA, [facility] 02/22/2024 8:22 AM

## 2024-02-22 NOTE — Patient Instructions (Addendum)
 Medication Instructions:  Your physician recommends that you continue on your current medications as directed. Please refer to the Current Medication list given to you today.  *If you need a refill on your cardiac medications before your next appointment, please call your pharmacy*   Lab Work: Your physician recommends that you return for lab work in: 3 months (after starting Praluent) for FASTING lipid/liver panel  If you have labs (blood work) drawn today and your tests are completely normal, you will receive your results only by: MyChart Message (if you have MyChart) OR A paper copy in the mail If you have any lab test that is abnormal or we need to change your treatment, we will call you to review the results.   Testing/Procedures: Your physician has requested that you have an echocardiogram. Echocardiography is a painless test that uses sound waves to create images of your heart. It provides your doctor with information about the size and shape of your heart and how well your heart's chambers and valves are working. This procedure takes approximately one hour. There are no restrictions for this procedure. Please do NOT wear cologne, perfume, aftershave, or lotions (deodorant is allowed). Please arrive 15 minutes prior to your appointment time.  Please note: We ask at that you not bring children with you during ultrasound (echo/ vascular) testing. Due to room size and safety concerns, children are not allowed in the ultrasound rooms during exams. Our front office staff cannot provide observation of children in our lobby area while testing is being conducted. An adult accompanying a patient to their appointment will only be allowed in the ultrasound room at the discretion of the ultrasound technician under special circumstances. We apologize for any inconvenience.    Follow-Up: At [facility], you and your health needs are our priority.  As part of our continuing mission to  provide you with exceptional heart care, we have created designated Provider Care Teams.  These Care Teams include your primary Cardiologist (physician) and Advanced Practice Providers (APPs -  Physician Assistants and Nurse Practitioners) who all work together to provide you with the care you need, when you need it.  We recommend signing up for the patient portal called "MyChart".  Sign up information is provided on this After Visit Summary.  MyChart is used to connect with patients for Virtual Visits (Telemedicine).  Patients are able to view lab/test results, encounter notes, upcoming appointments, etc.  Non-urgent messages can be sent to your provider as well.   To learn more about what you can do with MyChart, go to [email or web address].    Your next appointment:   12 month(s)  Provider:   Nanetta Batty, MD     Other Instructions   1st Floor: - Lobby - Registration  - Pharmacy  - Lab - Cafe  2nd Floor: - PV Lab - Diagnostic Testing (echo, CT, nuclear med)  3rd Floor: - Vacant  4th Floor: - TCTS (cardiothoracic surgery) - AFib Clinic - Structural Heart Clinic - Vascular Surgery  - Vascular Ultrasound  5th Floor: - [facility] Cardiology (general and EP) - Clinical Pharmacy for coumadin, hypertension, lipid, weight-loss medications, and med management appointments    Valet parking services will be available as well.

## 2024-02-27 ENCOUNTER — Ambulatory Visit (INDEPENDENT_AMBULATORY_CARE_PROVIDER_SITE_OTHER): Payer: No Typology Code available for payment source | Admitting: Physician Assistant

## 2024-02-27 ENCOUNTER — Encounter (INDEPENDENT_AMBULATORY_CARE_PROVIDER_SITE_OTHER): Payer: Self-pay | Admitting: Physician Assistant

## 2024-02-27 VITALS — BP 114/70 | HR 68 | Temp 97.5°F | Ht 72.0 in | Wt 256.0 lb

## 2024-02-27 DIAGNOSIS — E88819 Insulin resistance, unspecified: Secondary | ICD-10-CM | POA: Diagnosis not present

## 2024-02-27 DIAGNOSIS — E785 Hyperlipidemia, unspecified: Secondary | ICD-10-CM | POA: Diagnosis not present

## 2024-02-27 DIAGNOSIS — Z789 Other specified health status: Secondary | ICD-10-CM

## 2024-02-27 DIAGNOSIS — E538 Deficiency of other specified B group vitamins: Secondary | ICD-10-CM

## 2024-02-27 DIAGNOSIS — Z6834 Body mass index (BMI) 34.0-34.9, adult: Secondary | ICD-10-CM

## 2024-02-27 DIAGNOSIS — I10 Essential (primary) hypertension: Secondary | ICD-10-CM | POA: Diagnosis not present

## 2024-02-27 DIAGNOSIS — E559 Vitamin D deficiency, unspecified: Secondary | ICD-10-CM

## 2024-02-27 DIAGNOSIS — E669 Obesity, unspecified: Secondary | ICD-10-CM

## 2024-02-27 DIAGNOSIS — E782 Mixed hyperlipidemia: Secondary | ICD-10-CM

## 2024-02-27 DIAGNOSIS — E039 Hypothyroidism, unspecified: Secondary | ICD-10-CM

## 2024-02-27 NOTE — Progress Notes (Signed)
 SUBJECTIVE: Discussed the use of AI scribe software for clinical note transcription with the patient, who gave verbal consent to proceed.  Chief Complaint: Obesity  Interim History: He is down 2 lbs since last visit Down 11 lbs overall TBW loss of 4.12 % Johathon is here to discuss his progress with his obesity treatment plan. He is on the Category 2 Plan and states he is following his eating plan approximately 65-75 % of the time. He states he is exercising walking /swimming  5 times per week.  Fraser Busche is a 63 year old male who presents for a follow-up on his obesity treatment plan.  He is following a category two obesity treatment plan and has lost two pounds since his last visit, with a current BMI of 34.71. He adheres to the plan approximately 65-75% of the time and engages in regular physical activity, including walking during the day, walking one mile after work, and swimming at [employer]. He prepares his meals weekly and follows a structured diet plan, including specific meals for breakfast, lunch, and dinner. He has difficulty with vegetable intake but consumes salads with romaine, spinach, carrots, tomatoes, and cucumbers. He occasionally snacks on hard-boiled eggs and milk instead of less healthy options.  He experienced a six-week period of respiratory illness, which affected his ability to exercise and led to increased sedentary behavior and comfort eating. During this time, he had difficulty breathing and was prescribed an inhaler by another doctor. He has since resumed his exercise routine and reports feeling better. No current respiratory symptoms are present.  His follow-up labs from the last visit showed a slight decrease in kidney function compared to previous results, but it remains close to the low end of normal. Cholesterol levels are elevated, and he has not yet started Praluent due to a delivery issue and personal preference to try natural methods first. Vitamin D  levels are above the desired range, and he has been advised to stop vitamin D supplementation temporarily. Vitamin B levels are slightly above normal, likely due to an accidental extra dose. Thyroid function tests show a T4 level slightly above normal, but TSH is within the normal range. He reports feeling energetic despite recent illness. His A1c is 5.0, indicating good glucose control, and insulin levels have improved but remain slightly elevated, attributed to dietary changes, including reduced intake of simple carbohydrates and baked goods.  He is currently on losartan 100 mg (1 1/2 tablets) once daily and hydrochlorothiazide 25 mg daily for blood pressure management. He also takes B12 500 mcg every other day, a multivitamin daily, other B complex vitamins, bee pollen, vitamin C, turmeric, and an 81 mg aspirin daily.  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 He has a grandchild and is involved in family activities, including helping his son build a house. 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 OBJECTIVE: Visit Diagnoses: Problem List Items Addressed This Visit     HTN (hypertension)   Hyperlipidemia   Other Visit Diagnoses       Insulin resistance    -  Primary     Statin intolerance         Vitamin D deficiency         Vitamin B12 deficiency  Hypothyroidism, unspecified type         Obesity (BMI 30-39.9) - start BMI 37.39         Obesity Deontae Robson, a 63 year old male with obesity, has lost 2 pounds since his last visit, with a current BMI of 34.71. He adheres to a category two weight loss plan 65-75% of the time and engages in regular physical activity, including walking and swimming. His target weight is 220 pounds, 30 pounds less than his current weight.  He is motivated, making dietary  adjustments such as meal prepping and choosing healthier snacks. The importance of gradual weight loss to avoid muscle loss and the benefits of his current regimen were emphasized. - Continue current weight loss plan and exercise regimen. - Encourage adherence to dietary adjustments and healthy snacking. - Monitor weight and BMI at follow-up visits.   Insulin Resistance Last fasting insulin was 11.5- slightly above goal but improved. A1c was 5.0- at goal and stable. Polyphagia:No Medication(s): None Lab Results  Component Value Date   HGBA1C 5.0 02/06/2024   HGBA1C 5.0 04/25/2023   HGBA1C 5.2 07/13/2022   HGBA1C 5.1 12/21/2021   HGBA1C 5.3 01/03/2010   Lab Results  Component Value Date   INSULIN 11.5 02/06/2024   INSULIN 14.5 04/25/2023    Plan: Continue working on nutrition plan to decrease simple carbohydrates, increase lean proteins and exercise to promote weight loss, improve glycemic control and prevent progression to Type 2 diabetes.   Hyperlipidemia Lab Results  Component Value Date   CHOL 245 (H) 02/06/2024   CHOL 215 (H) 06/27/2023   CHOL 227 (H) 04/25/2023   Lab Results  Component Value Date   HDL 54 02/06/2024   HDL 52 06/27/2023   HDL 55 04/25/2023   Lab Results  Component Value Date   LDLCALC 179 (H) 02/06/2024   LDLCALC 145 (H) 06/27/2023   LDLCALC 155 (H) 04/25/2023   Lab Results  Component Value Date   TRIG 70 02/06/2024   TRIG 102 06/27/2023   TRIG 97 04/25/2023   Lab Results  Component Value Date   CHOLHDL 4.5 02/06/2024   CHOLHDL 4.1 06/27/2023   CHOLHDL 4.3 07/13/2022   No results found for: "LDLDIRECT"  Cholesterol levels remain elevated, with LDL levels higher than desired. He has not started Praluent due to previous issues with medication delivery and a preference for natural methods.  Plans to start Praluent after his upcoming trip. Discussed the importance of managing cholesterol levels and potential side effects of Praluent, such  as flu-like symptoms and body aches. He is aware of the need to continue weight loss and dietary modifications to improve his lipid profile. - Start Praluent after returning from the trip. Continue to follow up with Dr. Allyson Sabal for management - Continue monitoring cholesterol levels. - Encourage weight loss and dietary modifications to improve lipid profile. Continue to work on nutrition plan -decreasing simple carbohydrates, increasing lean proteins, decreasing saturated fats and cholesterol , avoiding trans fats and exercise as able to promote weight loss, improve lipids and decrease cardiovascular risks.  Hypertension Hypertension is managed with losartan 100 mg and hydrochlorothiazide 25 mg daily. He is compliant with medication, and no concerns about current blood pressure control were expressed. Lab Results  Component Value Date   NA 141 02/06/2024   CL 102 02/06/2024   K 4.2 02/06/2024   CO2 26 02/06/2024   BUN 20 02/06/2024   CREATININE 0.74 (L) 02/06/2024   EGFR 102 02/06/2024   CALCIUM 9.0 02/06/2024  ALBUMIN 4.3 02/06/2024   GLUCOSE 94 02/06/2024   Renal function is stable.  - Continue losartan 100 mg and hydrochlorothiazide 25 mg daily. - Monitor blood pressure at follow-up visits. Continue to work on nutrition plan to promote weight loss and improve BP control.   Hypothyroid T4 levels are slightly elevated, but TSH is now within the normal range. He has ongoing thyroid management with recent dose adjustments. T4 levels are trending downwards, with plans to recheck thyroid function in a few months. Continue current Levothyroxine 224 mcg daily - Recheck thyroid function in a few months.  Vitamin D Excess Vitamin D levels are above the desired range, potentially leading to muscle weakness and aches. He has been taking 10,000 IU of vitamin D daily, which is higher than necessary. Advised stopping vitamin D supplementation temporarily to allow levels to decrease. - Stop  vitamin D supplementation temporarily. - Recheck vitamin D levels in a few months.  Vitamin B12 Excess Vitamin B12 levels are slightly above the upper end of normal.  He takes B12 supplements regularly and had an accidental extra dose before the lab test. Advised adjusting the supplementation frequency to every other day to avoid unnecessary expense and maintain appropriate levels. - Change B12 supplementation to every other day.  Vitals Temp: (!) 97.5 F (36.4 C) BP: 114/70 Pulse Rate: 68 SpO2: 97 %   Anthropometric Measurements Height: 6' (1.829 m) Weight: 256 lb (116.1 kg) BMI (Calculated): 34.71 Weight at Last Visit: 258lb Weight Lost Since Last Visit: 2lb Weight Gained Since Last Visit: 0 Starting Weight: 267lb Total Weight Loss (lbs): 11 lb (4.99 kg) Peak Weight: 315lb   Body Composition  Body Fat %: 36.4 % Fat Mass (lbs): 93.4 lbs Muscle Mass (lbs): 155.2 lbs Total Body Water (lbs): 116.2 lbs Visceral Fat Rating : 22   Other Clinical Data Fasting: no Labs: no Today's Visit #: 10 Starting Date: 04/25/23     ASSESSMENT AND PLAN:  Diet: Tyce is currently in the action stage of change. As such, his goal is to continue with weight loss efforts. He has agreed to Category 2 Plan.  Exercise: Haytham has been instructed to continue exercising as is for weight loss and overall health benefits.   Behavior Modification:  We discussed the following Behavioral Modification Strategies today: increasing lean protein intake, decreasing simple carbohydrates, increasing vegetables, increase H2O intake, increase high fiber foods, meal planning and cooking strategies, ways to avoid nighttime snacking, better snacking choices, avoiding temptations, and planning for success. We discussed various medication options to help Juwaun with his weight loss efforts and we both agreed to continue to work on nutritional and behavioral strategies to promote weight loss.  .  Return in  about 2 weeks (around 03/12/2024).[name] He was informed of the importance of frequent follow up visits to maximize his success with intensive lifestyle modifications for his multiple health conditions.  Attestation Statements:   Reviewed by clinician on day of visit: allergies, medications, problem list, medical history, surgical history, family history, social history, and previous encounter notes.   Time spent on visit including pre-visit chart review and post-visit care and charting was 43 minutes.    Itai Barbian, PA-C

## 2024-03-13 ENCOUNTER — Ambulatory Visit (INDEPENDENT_AMBULATORY_CARE_PROVIDER_SITE_OTHER): Payer: No Typology Code available for payment source | Admitting: Family Medicine

## 2024-03-13 ENCOUNTER — Encounter (INDEPENDENT_AMBULATORY_CARE_PROVIDER_SITE_OTHER): Payer: Self-pay | Admitting: Family Medicine

## 2024-03-13 VITALS — BP 136/80 | HR 50 | Temp 98.1°F | Ht 72.0 in | Wt 259.0 lb

## 2024-03-13 DIAGNOSIS — E559 Vitamin D deficiency, unspecified: Secondary | ICD-10-CM | POA: Diagnosis not present

## 2024-03-13 DIAGNOSIS — E669 Obesity, unspecified: Secondary | ICD-10-CM

## 2024-03-13 DIAGNOSIS — E538 Deficiency of other specified B group vitamins: Secondary | ICD-10-CM

## 2024-03-13 DIAGNOSIS — E039 Hypothyroidism, unspecified: Secondary | ICD-10-CM | POA: Diagnosis not present

## 2024-03-13 DIAGNOSIS — Z6835 Body mass index (BMI) 35.0-35.9, adult: Secondary | ICD-10-CM

## 2024-03-13 NOTE — Progress Notes (Signed)
 Samuel Goodwin, D.O.  ABFM, ABOM Specializing in Clinical Bariatric Medicine  Office located at: [address] [place], [place]  [address]   Assessment and Plan:   FOR THE DISEASE OF OBESITY: BMI 35.0-35.9,adult- Current BMI 35.12 Obesity (BMI 30-39.9) - start BMI 37.39 Assessment & Plan: Since last office visit on 02/27/24 patient's muscle mass has increased by 1.8 lb. Fat mass has increased by 0.8 lb. Total body water has increased by 1.6 lb.  Counseling done on how various foods will affect these numbers and how to maximize success  Total lbs lost to date: 8 lbs  Total weight loss percentage to date: -3.0%    Recommended Dietary Goals Samuel Goodwin is currently in the action stage of change. As such, his goal is to continue weight management plan.  He has agreed to:  Dynegy with target calories of 1450-1550 per day and 90+++ g of protein per day with CAT 2 MP as a guide.      Behavioral Intervention We discussed the following today: increasing lean protein intake to established goals, increasing water intake , work on tracking and journaling calories using tracking application, and continue to practice mindfulness when eating  Additional resources provided today: Personalized instruction of using My Net Diary.   Evidence-based interventions for health behavior change were utilized today including the discussion of self monitoring techniques, problem-solving barriers and SMART goal setting techniques.   Regarding patient's less desirable eating habits and patterns, we employed the technique of small changes.   Pt will specifically work on:  Take correct doses of vitamin supplementation for next visit.    Recommended Physical Activity Goals Samuel Goodwin has been advised to work up to 150 minutes of moderate intensity aerobic activity a week and strengthening exercises 2-3 times per week for cardiovascular health, weight loss maintenance and preservation of muscle mass.   He  has agreed to:  Continue current level of physical activity    Pharmacotherapy We both agreed to: continue with nutritional and behavioral strategies   FOR ASSOCIATED CONDITIONS ADDRESSED TODAY: Hypothyroidism, unspecified type Assessment & Plan: Lab Results  Component Value Date   TSH 1.110 02/06/2024   Pt is on Synthroid 224 mcg once daily, managed by his PCP at the [facility]. Good compliance and tolerance with no adverse SE reported. No acute concerns in this regard today. Pt drinking at least 60-80 flo oz of water per day.   Continue current medication regimen as prescribed and follow up with PCP as directed by them. Increase water intake to half his weight in ounces of water, especially when exercising. Will continue to monitor condition alongside PCP as it relates to his weight loss journey.    Vitamin B12 deficiency Assessment & Plan: Lab Results  Component Value Date   VITAMINB12 1,550 (H) 02/06/2024   Pt is prescribed vitamin B12 300-500 mcg once every other day. He reports he was taking double the dose prescribed by accident. He is currently back to his prescribed dose of 300-500 mcg every other day, as directed.   Encouraged pt to be mindful when taking vitamins to avoid any over-supplementation. Discussed potential risks to his health and misconceptions of the harms of over supplementation. Continue with vitamin B12 supplementation, as prescribed. Will continue to monitor his condition.    Vitamin D deficiency Assessment & Plan: Lab Results  Component Value Date   VD25OH 114.0 (H) 02/06/2024   VD25OH 59.1 04/25/2023   VD25OH 37 01/03/2010   Pt was previously  prescribed Cholecalciferol 1,000 IUs once daily. At his last office visit with Lazaro Arms, PA on 02/27/24 he was instructed to temporarily stop vitamin D supplementation as he reported taking 10,000 IUs daily at the time. Today, he reports he recently found out he was actually over supplementing by taking 25,000 IUs  daily. He stopped taking vitamin D for 2 weeks and recently restarted supplementation with a multivitamin in the last week or so. He states this occurred because he bought a different OTC vitamin D at the time and added his new vitamin D tabs to his pill organizer without removing the existing vitamin D tabs from the organizer.   Encouraged pt to be mindful when taking vitamins to avoid any over-supplementation and stressed the importance of taking the dosage as prescribed to avoid potential risks to his health. Additionally, we discussed the misconceptions of the harms of over supplementation. Continue with multivitamin. We will plan to recheck vitamin D levels around 05/30/24. About 4 months from last obtained labs.   Follow up:   Return in about 2 weeks (around 03/27/2024) for 2 week follow up. He was informed of the importance of frequent follow up visits to maximize his success with intensive lifestyle modifications for his multiple health conditions.  Subjective:   Chief complaint: Obesity Samuel Goodwin is here to discuss his progress with his obesity treatment plan. He is on the Category 2 Plan with 6 oz at lunch and 10 oz at dinner and states he is following his eating plan approximately 75% of the time. He states he is walking 15-30 minutes 5 days per week and swimming 60 minutes 3 days per week.   Interval History:  Samuel Goodwin is here for a follow up office visit. Since last OV on 3/19/2 with Shawn Rayburn, PA, he has gained 3 lbs. Reports eating off plan for about 4 meals in the last 2 weeks. For breakfast he eats 2 boiled eggs, 2 [employer] cheese wedges(25 calories each), and a small tortilla wrap (60 cal). For lunch he eats 6-8 oz of chicken breast and 1 piece of mozzarella string cheese. He has also been eating [employer] Bars.   He reports being sick recently and endorses "feeling yucky and run down" making it difficult for him to exercise. Has a hx of sleep apnea and has remained  consistently compliant with CPAP therapy.   Pharmacotherapy for weight loss: He is currently taking no anti-obesity medication.   Review of Systems:  Pertinent positives were addressed with patient today.  Reviewed by clinician on day of visit: allergies, medications, problem list, medical history, surgical history, family history, social history, and previous encounter notes.  Weight Summary and Biometrics   Weight Lost Since Last Visit: 0  Weight Gained Since Last Visit: 3 lb    Vitals Temp: 98.1 F (36.7 C) BP: 136/80 Pulse Rate: (!) 50 SpO2: 100 %   Anthropometric Measurements Height: 6' (1.829 m) Weight: 259 lb (117.5 kg) BMI (Calculated): 35.12 Weight at Last Visit: 256 lb Weight Lost Since Last Visit: 0 Weight Gained Since Last Visit: 3 lb Starting Weight: 267 lb Total Weight Loss (lbs): 8 lb (3.629 kg) Peak Weight: 315 lb   Body Composition  Body Fat %: 36.3 % Fat Mass (lbs): 94.2 lbs Muscle Mass (lbs): 157 lbs Total Body Water (lbs): 117.8 lbs Visceral Fat Rating : 21   Other Clinical Data Fasting: no Labs: no Today's Visit #: 11 Starting Date: 04/25/23    Objective:  PHYSICAL EXAM: Blood pressure 136/80, pulse (!) 50, temperature 98.1 F (36.7 C), height 6' (1.829 m), weight 259 lb (117.5 kg), SpO2 100%. Body mass index is 35.13 kg/m.  General: he is overweight, cooperative and in no acute distress. PSYCH: Has normal mood, affect and thought process.   HEENT: EOMI, sclerae are anicteric. Lungs: Normal breathing effort, no conversational dyspnea. Extremities: Moves * 4 Neurologic: A and O * 3, good insight  DIAGNOSTIC DATA REVIEWED: BMET    Component Value Date/Time   NA 141 02/06/2024 0842   K 4.2 02/06/2024 0842   CL 102 02/06/2024 0842   CO2 26 02/06/2024 0842   GLUCOSE 94 02/06/2024 0842   GLUCOSE 91 08/23/2020 1723   BUN 20 02/06/2024 0842   CREATININE 0.74 (L) 02/06/2024 0842   CALCIUM 9.0 02/06/2024 0842   GFRNONAA >60  08/23/2020 1723   GFRAA >60 08/23/2020 1723   Lab Results  Component Value Date   HGBA1C 5.0 02/06/2024   HGBA1C 5.3 01/03/2010   Lab Results  Component Value Date   INSULIN 11.5 02/06/2024   INSULIN 14.5 04/25/2023   Lab Results  Component Value Date   TSH 1.110 02/06/2024   CBC    Component Value Date/Time   WBC 5.3 04/25/2023 0854   WBC 5.7 08/23/2020 1723   RBC 4.84 04/25/2023 0854   RBC 4.54 08/23/2020 1723   HGB 14.5 04/25/2023 0854   HCT 42.2 04/25/2023 0854   PLT 219 04/25/2023 0854   MCV 87 04/25/2023 0854   MCH 30.0 04/25/2023 0854   MCH 29.3 08/23/2020 1723   MCHC 34.4 04/25/2023 0854   MCHC 34.6 08/23/2020 1723   RDW 12.2 04/25/2023 0854   Iron Studies No results found for: "IRON", "TIBC", "FERRITIN", "IRONPCTSAT" Lipid Panel     Component Value Date/Time   CHOL 245 (H) 02/06/2024 0842   TRIG 70 02/06/2024 0842   HDL 54 02/06/2024 0842   CHOLHDL 4.5 02/06/2024 0842   CHOLHDL 3.2 11/03/2014 0800   VLDL 19 11/03/2014 0800   LDLCALC 179 (H) 02/06/2024 0842   Hepatic Function Panel     Component Value Date/Time   PROT 6.5 02/06/2024 0842   ALBUMIN 4.3 02/06/2024 0842   AST 20 02/06/2024 0842   ALT 16 02/06/2024 0842   ALKPHOS 67 02/06/2024 0842   BILITOT 0.5 02/06/2024 0842   BILIDIR 0.12 01/14/2021 0847   IBILI 0.4 11/03/2014 0800      Component Value Date/Time   TSH 1.110 02/06/2024 0842   Nutritional Lab Results  Component Value Date   VD25OH 114.0 (H) 02/06/2024   VD25OH 59.1 04/25/2023   VD25OH 37 01/03/2010    Attestations:   I, Isabelle Course, acting as a medical scribe for Thomasene Lot, DO., have compiled all relevant documentation for today's office visit on behalf of Thomasene Lot, DO, while in the presence of [employer], DO.  Reviewed by clinician on day of visit: allergies, medications, problem list, medical history, surgical history, family history, social history, and previous encounter notes pertinent to  patient's obesity diagnosis.  I have reviewed the above documentation for accuracy and completeness, and I agree with the above. Samuel Goodwin, D.O.  The 21st Century Cures Act was signed into law in 2016 which includes the topic of electronic health records.  This provides immediate access to information in MyChart.  This includes consultation notes, operative notes, office notes, lab results and pathology reports.  If you have any questions about what you read please  let us know at your next visit so we can discuss your concerns and take corrective action if need be.  We are right here with you.

## 2024-03-27 ENCOUNTER — Ambulatory Visit (INDEPENDENT_AMBULATORY_CARE_PROVIDER_SITE_OTHER): Payer: No Typology Code available for payment source | Admitting: Family Medicine

## 2024-04-10 ENCOUNTER — Ambulatory Visit (INDEPENDENT_AMBULATORY_CARE_PROVIDER_SITE_OTHER): Payer: No Typology Code available for payment source | Admitting: Family Medicine

## 2024-04-24 ENCOUNTER — Ambulatory Visit (INDEPENDENT_AMBULATORY_CARE_PROVIDER_SITE_OTHER): Payer: No Typology Code available for payment source | Admitting: Family Medicine

## 2024-04-24 ENCOUNTER — Encounter (INDEPENDENT_AMBULATORY_CARE_PROVIDER_SITE_OTHER): Payer: Self-pay | Admitting: Family Medicine

## 2024-04-24 VITALS — BP 120/77 | HR 61 | Temp 97.8°F | Ht 72.0 in | Wt 259.0 lb

## 2024-04-24 DIAGNOSIS — G4733 Obstructive sleep apnea (adult) (pediatric): Secondary | ICD-10-CM

## 2024-04-24 DIAGNOSIS — I10 Essential (primary) hypertension: Secondary | ICD-10-CM | POA: Diagnosis not present

## 2024-04-24 DIAGNOSIS — E669 Obesity, unspecified: Secondary | ICD-10-CM | POA: Diagnosis not present

## 2024-04-24 DIAGNOSIS — Z6835 Body mass index (BMI) 35.0-35.9, adult: Secondary | ICD-10-CM

## 2024-04-24 DIAGNOSIS — E88819 Insulin resistance, unspecified: Secondary | ICD-10-CM | POA: Diagnosis not present
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
## 2024-05-10 ENCOUNTER — Other Ambulatory Visit (HOSPITAL_BASED_OUTPATIENT_CLINIC_OR_DEPARTMENT_OTHER): Payer: Self-pay

## 2024-05-12 ENCOUNTER — Other Ambulatory Visit (HOSPITAL_BASED_OUTPATIENT_CLINIC_OR_DEPARTMENT_OTHER): Payer: Self-pay

## 2024-05-12 MED ORDER — DICLOFENAC SODIUM 1 % EX GEL
CUTANEOUS | 2 refills | Status: AC
  Filled 2024-05-12: qty 500, 30d supply, fill #0

## 2024-05-19 ENCOUNTER — Encounter (INDEPENDENT_AMBULATORY_CARE_PROVIDER_SITE_OTHER): Payer: Self-pay | Admitting: Family Medicine

## 2024-05-19 ENCOUNTER — Ambulatory Visit (INDEPENDENT_AMBULATORY_CARE_PROVIDER_SITE_OTHER): Admitting: Family Medicine

## 2024-05-19 VITALS — BP 139/84 | HR 65 | Temp 97.8°F | Ht 72.0 in | Wt 260.0 lb

## 2024-05-19 DIAGNOSIS — E782 Mixed hyperlipidemia: Secondary | ICD-10-CM

## 2024-05-19 DIAGNOSIS — E669 Obesity, unspecified: Secondary | ICD-10-CM

## 2024-05-19 DIAGNOSIS — E88819 Insulin resistance, unspecified: Secondary | ICD-10-CM | POA: Diagnosis not present

## 2024-05-19 DIAGNOSIS — Z6835 Body mass index (BMI) 35.0-35.9, adult: Secondary | ICD-10-CM

## 2024-05-19 DIAGNOSIS — I10 Essential (primary) hypertension: Secondary | ICD-10-CM | POA: Diagnosis not present

## 2024-05-19 NOTE — Progress Notes (Signed)
 Rae Bugler, D.O.  ABFM, ABOM Specializing in Clinical Bariatric Medicine  Office located at: [address] [place], [place]  [address]   Assessment and Plan:  No orders of the defined types were placed in this encounter.   There are no discontinued medications.   No orders of the defined types were placed in this encounter.   RECHECK Vitamin D  next OV  FOR THE DISEASE OF OBESITY:  BMI 35.0-35.9,adult- Current BMI 35.25 Obesity (BMI 30-39.9)- start BMI 37.39 Assessment & Plan: Since last office visit on 04/24/2024 patient's  Muscle mass has increased by 8.8 lb. Fat mass has increased by 0.4 lb. Total body water  has decreased by 0.2 lb.  Counseling done on how various foods will affect these numbers and how to maximize success  Total lbs lost to date: 7 lbs  Total weight loss percentage to date: 2.62%    Recommended Dietary Goals Tahji is currently in the action stage of change. As such, his goal is to continue weight management plan.  He has agreed to: continue current plan   Behavioral Intervention We discussed the following today: work on tracking and journaling calories using tracking application  Additional resources provided today: Handout on Daily Food Journaling Log  Evidence-based interventions for health behavior change were utilized today including the discussion of self monitoring techniques, problem-solving barriers and SMART goal setting techniques.   Regarding patient's less desirable eating habits and patterns, we employed the technique of small changes.   Pt will specifically work on journaling his intake/bringing in his food log.   Recommended Physical Activity Goals Fordyce has been advised to work up to 300-450 minutes of moderate intensity aerobic activity a week and strengthening exercises 2-3 times per week for cardiovascular health, weight loss maintenance and preservation of muscle mass.   He has agreed to : Continue current level of  physical activity    Pharmacotherapy We both agreed to Continue with current nutritional and behavioral strategies   ASSOCIATED CONDITIONS ADDRESSED TODAY:  Primary hypertension Assessment & Plan: Last 3 blood pressure readings in our office are as follows: BP Readings from Last 3 Encounters:  05/19/24 139/84  04/24/24 120/77  03/13/24 136/80   The 10-year ASCVD risk score (Arnett DK, et al., 2019) is: 15.1%  Lab Results  Component Value Date   CREATININE 0.74 (L) 02/06/2024   BP stable today on Losartan  100 mg 0.5 tablet daily and hydrochlorothiazide  25 mg daily. Continue regimen and low sodium diet, advance exercise as able.    Mixed hyperlipidemia Assessment & Plan: Lab Results  Component Value Date   CHOL 245 (H) 02/06/2024   HDL 54 02/06/2024   LDLCALC 179 (H) 02/06/2024   TRIG 70 02/06/2024   CHOLHDL 4.5 02/06/2024   The 10-year ASCVD risk score (Arnett DK, et al., 2019) is: 15.1%   Values used to calculate the score:     Age: 63 years     Sex: Male     Is Non-Hispanic African American: No     Diabetic: No     Tobacco smoker: No     Systolic Blood Pressure: 139 mmHg     Is BP treated: Yes     HDL Cholesterol: 54 mg/dL     Total Cholesterol: 245 mg/dL  He has yet to start the Praluent  prescribed to him by cardiology. He plans to start it shortly after his daughters wedding on 6/11.  Continue to work on nutrition plan -decreasing simple carbohydrates, increasing lean  proteins, decreasing saturated fats and cholesterol , avoiding trans fats and exercise as able to promote weight loss, improve lipids and decrease cardiovascular risks.    Insulin  resistance ***  Protein helps with hunger and cravings.     Follow up:   No follow-ups on file. He was informed of the importance of frequent follow up visits to maximize his success with intensive lifestyle modifications for his multiple health conditions.  Subjective:   Chief complaint: Obesity Baylon is  here to discuss his progress with his obesity treatment plan.He is  supposed to keep a food journal of 1450-1550 calories and 110+ grams protein daily with the CAT 2 MP as a guide and states he is following his eating plan approximately 75% of the time. He states he is walking 30 minutes daily and swimming 30-45 minutes 2 days per week.   Interval History:  Torie Priebe is here for a follow up office visit. Since last OV on 04/24/2024, Mr.Falzone is up 1 lb. Has not been able to journal d/t his busy schedule. States he is still making healthy food choices overall and is protein focused. His daughter is getting married this Wednesday.    Pharmacotherapy for weight loss: none  Review of Systems:  Pertinent positives were addressed with patient today.  Reviewed by clinician on day of visit: allergies, medications, problem list, medical history, surgical history, family history, social history, and previous encounter notes.  Weight Summary and Biometrics   Weight Lost Since Last Visit: 0lb  Weight Gained Since Last Visit: 1lb   Vitals Temp: 97.8 F (36.6 C) BP: 139/84 Pulse Rate: 65 SpO2: 99 %   Anthropometric Measurements Height: 6' (1.829 m) Weight: 260 lb (117.9 kg) BMI (Calculated): 35.25 Weight at Last Visit: 259lb Weight Lost Since Last Visit: 0lb Weight Gained Since Last Visit: 1lb Starting Weight: 267lb Total Weight Loss (lbs): 7 lb (3.175 kg) Peak Weight: 315lb   Body Composition  Body Fat %: 35.6 % Fat Mass (lbs): 92.8 lbs Muscle Mass (lbs): 167.8 lbs Total Body Water  (lbs): 117.6 lbs Visceral Fat Rating : 21   Other Clinical Data Fasting: No Labs: No Today's Visit #: 13 Starting Date: 04/25/23   Objective:   PHYSICAL EXAM: Blood pressure 139/84, pulse 65, temperature 97.8 F (36.6 C), height 6' (1.829 m), weight 260 lb (117.9 kg), SpO2 99%. Body mass index is 35.26 kg/m.  General: he is overweight, cooperative and in no acute distress. PSYCH:  Has normal mood, affect and thought process.   HEENT: EOMI, sclerae are anicteric. Lungs: Normal breathing effort, no conversational dyspnea. Extremities: Moves * 4 Neurologic: A and O * 3, good insight  DIAGNOSTIC DATA REVIEWED: BMET    Component Value Date/Time   NA 141 02/06/2024 0842   K 4.2 02/06/2024 0842   CL 102 02/06/2024 0842   CO2 26 02/06/2024 0842   GLUCOSE 94 02/06/2024 0842   GLUCOSE 91 08/23/2020 1723   BUN 20 02/06/2024 0842   CREATININE 0.74 (L) 02/06/2024 0842   CALCIUM  9.0 02/06/2024 0842   GFRNONAA >60 08/23/2020 1723   GFRAA >60 08/23/2020 1723   Lab Results  Component Value Date   HGBA1C 5.0 02/06/2024   HGBA1C 5.3 01/03/2010   Lab Results  Component Value Date   INSULIN  11.5 02/06/2024   INSULIN  14.5 04/25/2023   Lab Results  Component Value Date   TSH 1.110 02/06/2024   CBC    Component Value Date/Time   WBC 5.3 04/25/2023 0854  WBC 5.7 08/23/2020 1723   RBC 4.84 04/25/2023 0854   RBC 4.54 08/23/2020 1723   HGB 14.5 04/25/2023 0854   HCT 42.2 04/25/2023 0854   PLT 219 04/25/2023 0854   MCV 87 04/25/2023 0854   MCH 30.0 04/25/2023 0854   MCH 29.3 08/23/2020 1723   MCHC 34.4 04/25/2023 0854   MCHC 34.6 08/23/2020 1723   RDW 12.2 04/25/2023 0854   Iron Studies No results found for: "IRON", "TIBC", "FERRITIN", "IRONPCTSAT" Lipid Panel     Component Value Date/Time   CHOL 245 (H) 02/06/2024 0842   TRIG 70 02/06/2024 0842   HDL 54 02/06/2024 0842   CHOLHDL 4.5 02/06/2024 0842   CHOLHDL 3.2 11/03/2014 0800   VLDL 19 11/03/2014 0800   LDLCALC 179 (H) 02/06/2024 0842   Hepatic Function Panel     Component Value Date/Time   PROT 6.5 02/06/2024 0842   ALBUMIN  4.3 02/06/2024 0842   AST 20 02/06/2024 0842   ALT 16 02/06/2024 0842   ALKPHOS 67 02/06/2024 0842   BILITOT 0.5 02/06/2024 0842   BILIDIR 0.12 01/14/2021 0847   IBILI 0.4 11/03/2014 0800      Component Value Date/Time   TSH 1.110 02/06/2024 0842   Nutritional Lab  Results  Component Value Date   VD25OH 114.0 (H) 02/06/2024   VD25OH 59.1 04/25/2023   VD25OH 37 01/03/2010    Attestations:   I, Special Puri , acting as a Stage manager for Marceil Sensor, DO., have compiled all relevant documentation for today's office visit on behalf of Marceil Sensor, DO, while in the presence of [employer], DO.  I have reviewed the above documentation for accuracy and completeness, and I agree with the above. Rae Bugler, D.O.  The 21st Century Cures Act was signed into law in 2016 which includes the topic of electronic health records.  This provides immediate access to information in MyChart.  This includes consultation notes, operative notes, office notes, lab results and pathology reports.  If you have any questions about what you read please let us  know at your next visit so we can discuss your concerns and take corrective action if need be.  We are right here with you.

## 2024-06-02 ENCOUNTER — Ambulatory Visit (INDEPENDENT_AMBULATORY_CARE_PROVIDER_SITE_OTHER): Admitting: Family Medicine

## 2024-06-16 ENCOUNTER — Ambulatory Visit (HOSPITAL_COMMUNITY)

## 2024-06-16 ENCOUNTER — Encounter (HOSPITAL_COMMUNITY): Payer: Self-pay

## 2024-06-19 ENCOUNTER — Telehealth (INDEPENDENT_AMBULATORY_CARE_PROVIDER_SITE_OTHER): Payer: Self-pay | Admitting: Family Medicine

## 2024-06-19 ENCOUNTER — Ambulatory Visit (INDEPENDENT_AMBULATORY_CARE_PROVIDER_SITE_OTHER): Admitting: Family Medicine

## 2024-06-19 NOTE — Telephone Encounter (Signed)
 Talked with Burnard, a [facility] Nurse Navigator. I called regarding the delayed renewal of this patient's 6 additional visits for Medical Weight Management/Nutrition. Burnard stated that the patient would need to see his endocrinologist first before an additional 6 visits would be approved. Burnard stated that the patient is scheduled to see the [facility] Endocrinologist on 08/14/2024. I then talked with the patient to make him aware of the [facility]'s current requirements. Pt understood and stated that he will contact the [facility] endcrinologist.   [facility].

## 2024-07-01 ENCOUNTER — Ambulatory Visit (HOSPITAL_COMMUNITY)

## 2024-08-05 ENCOUNTER — Telehealth (HOSPITAL_COMMUNITY): Payer: Self-pay | Admitting: Cardiovascular Disease

## 2024-08-05 ENCOUNTER — Ambulatory Visit (HOSPITAL_COMMUNITY)

## 2024-08-05 NOTE — Telephone Encounter (Signed)
 Patient called and cancelled echocardiogram for reason below:  08/04/2024 4:00 PM Ab:HPOO, Samuel Goodwin  Cancel Rsn: Patient (Work conflict)   Order will be removed from the echo WQ and when patient calls back to reschedule we will reinstate the order. Thank you.

## 2024-10-06 ENCOUNTER — Ambulatory Visit (HOSPITAL_COMMUNITY)
Admission: RE | Admit: 2024-10-06 | Discharge: 2024-10-06 | Disposition: A | Discharge status: Home / Self Care | Source: Ambulatory Visit | Attending: Cardiovascular Disease | Admitting: Cardiovascular Disease

## 2024-10-06 DIAGNOSIS — R931 Abnormal findings on diagnostic imaging of heart and coronary circulation: Secondary | ICD-10-CM | POA: Insufficient documentation

## 2024-10-06 DIAGNOSIS — E782 Mixed hyperlipidemia: Secondary | ICD-10-CM | POA: Diagnosis present

## 2024-10-06 DIAGNOSIS — I10 Essential (primary) hypertension: Secondary | ICD-10-CM | POA: Insufficient documentation

## 2024-10-06 DIAGNOSIS — I7121 Aneurysm of the ascending aorta, without rupture: Secondary | ICD-10-CM | POA: Insufficient documentation

## 2024-10-06 DIAGNOSIS — G4733 Obstructive sleep apnea (adult) (pediatric): Secondary | ICD-10-CM | POA: Insufficient documentation

## 2024-10-06 LAB — ECHOCARDIOGRAM COMPLETE
Area-P 1/2: 3.03 cm2
S' Lateral: 3.2 cm

## 2024-10-07 ENCOUNTER — Ambulatory Visit: Payer: Self-pay | Admitting: Cardiovascular Disease

## 2024-10-07 DIAGNOSIS — I7121 Aneurysm of the ascending aorta, without rupture: Secondary | ICD-10-CM

## 2024-10-07 NOTE — Telephone Encounter (Signed)
 Pt is calling to get his Echo results. Please Advise

## 2025-02-24 ENCOUNTER — Ambulatory Visit: Admitting: Cardiovascular Disease
